# Patient Record
Sex: Female | Born: 1940
Health system: Southern US, Community
[De-identification: ages and names within clinical notes are randomized; demographics above are authoritative.]

## PROBLEM LIST (undated history)

## (undated) DIAGNOSIS — N390 Urinary tract infection, site not specified: Secondary | ICD-10-CM

## (undated) DIAGNOSIS — K259 Gastric ulcer, unspecified as acute or chronic, without hemorrhage or perforation: Secondary | ICD-10-CM

## (undated) DIAGNOSIS — D649 Anemia, unspecified: Secondary | ICD-10-CM

## (undated) DIAGNOSIS — J449 Chronic obstructive pulmonary disease, unspecified: Secondary | ICD-10-CM

## (undated) DIAGNOSIS — J479 Bronchiectasis, uncomplicated: Secondary | ICD-10-CM

## (undated) DIAGNOSIS — A809 Acute poliomyelitis, unspecified: Secondary | ICD-10-CM

## (undated) DIAGNOSIS — I10 Essential (primary) hypertension: Secondary | ICD-10-CM

## (undated) DIAGNOSIS — H353190 Nonexudative age-related macular degeneration, unspecified eye, stage unspecified: Secondary | ICD-10-CM

## (undated) DIAGNOSIS — I776 Arteritis, unspecified: Secondary | ICD-10-CM

## (undated) DIAGNOSIS — A310 Pulmonary mycobacterial infection: Secondary | ICD-10-CM

## (undated) DIAGNOSIS — C801 Malignant (primary) neoplasm, unspecified: Secondary | ICD-10-CM

## (undated) DIAGNOSIS — F419 Anxiety disorder, unspecified: Secondary | ICD-10-CM

## (undated) DIAGNOSIS — H269 Unspecified cataract: Secondary | ICD-10-CM

## (undated) DIAGNOSIS — E785 Hyperlipidemia, unspecified: Secondary | ICD-10-CM

## (undated) DIAGNOSIS — I6529 Occlusion and stenosis of unspecified carotid artery: Secondary | ICD-10-CM

## (undated) DIAGNOSIS — D126 Benign neoplasm of colon, unspecified: Secondary | ICD-10-CM

## (undated) DIAGNOSIS — R0600 Dyspnea, unspecified: Secondary | ICD-10-CM

## (undated) HISTORY — DX: Gastric ulcer, unspecified as acute or chronic, without hemorrhage or perforation: K25.9

## (undated) HISTORY — DX: Benign neoplasm of colon, unspecified: D12.6

## (undated) HISTORY — DX: Essential (primary) hypertension: I10

## (undated) HISTORY — PX: CATARACT EXTRACTION: SUR2

## (undated) HISTORY — DX: Occlusion and stenosis of unspecified carotid artery: I65.29

## (undated) HISTORY — DX: Acute poliomyelitis, unspecified: A80.9

## (undated) HISTORY — PX: APPENDECTOMY: SHX54

## (undated) HISTORY — DX: Anxiety disorder, unspecified: F41.9

## (undated) HISTORY — PX: BREAST ENHANCEMENT SURGERY: SHX7

## (undated) HISTORY — PX: COLONOSCOPY: SHX174

## (undated) HISTORY — PX: TONSILLECTOMY AND ADENOIDECTOMY: SUR1326

## (undated) HISTORY — DX: Chronic obstructive pulmonary disease, unspecified: J44.9

## (undated) HISTORY — DX: Arteritis, unspecified: I77.6

## (undated) HISTORY — DX: Pulmonary mycobacterial infection: A31.0

## (undated) HISTORY — DX: Hyperlipidemia, unspecified: E78.5

## (undated) HISTORY — DX: Unspecified cataract: H26.9

---
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
## 1958-03-15 DIAGNOSIS — K259 Gastric ulcer, unspecified as acute or chronic, without hemorrhage or perforation: Secondary | ICD-10-CM

## 1958-03-15 HISTORY — DX: Gastric ulcer, unspecified as acute or chronic, without hemorrhage or perforation: K25.9

## 1968-03-15 HISTORY — PX: VAGINAL HYSTERECTOMY: SUR661

## 1998-10-27 ENCOUNTER — Other Ambulatory Visit: Admission: RE | Admit: 1998-10-27 | Discharge: 1998-10-27 | Payer: Self-pay | Admitting: Internal Medicine

## 1998-12-15 ENCOUNTER — Encounter: Payer: Self-pay | Admitting: Internal Medicine

## 1998-12-15 ENCOUNTER — Ambulatory Visit (HOSPITAL_COMMUNITY): Admission: RE | Admit: 1998-12-15 | Discharge: 1998-12-15 | Payer: Self-pay | Admitting: Internal Medicine

## 1999-01-06 ENCOUNTER — Other Ambulatory Visit: Admission: RE | Admit: 1999-01-06 | Discharge: 1999-01-06 | Payer: Self-pay | Admitting: Gastroenterology

## 1999-01-06 ENCOUNTER — Encounter (INDEPENDENT_AMBULATORY_CARE_PROVIDER_SITE_OTHER): Payer: Self-pay

## 2000-01-07 ENCOUNTER — Encounter: Payer: Self-pay | Admitting: Internal Medicine

## 2000-01-07 ENCOUNTER — Encounter: Admission: RE | Admit: 2000-01-07 | Discharge: 2000-01-07 | Payer: Self-pay | Admitting: Internal Medicine

## 2005-05-06 ENCOUNTER — Emergency Department (HOSPITAL_COMMUNITY): Admission: EM | Admit: 2005-05-06 | Discharge: 2005-05-06 | Payer: Self-pay | Admitting: Family Medicine

## 2006-05-25 ENCOUNTER — Encounter: Admission: RE | Admit: 2006-05-25 | Discharge: 2006-05-25 | Payer: Self-pay | Admitting: Internal Medicine

## 2007-04-14 ENCOUNTER — Encounter: Admission: RE | Admit: 2007-04-14 | Discharge: 2007-04-14 | Payer: Self-pay | Admitting: Internal Medicine

## 2008-10-22 ENCOUNTER — Encounter (INDEPENDENT_AMBULATORY_CARE_PROVIDER_SITE_OTHER): Payer: Self-pay | Admitting: *Deleted

## 2008-12-30 ENCOUNTER — Observation Stay (HOSPITAL_COMMUNITY): Admission: EM | Admit: 2008-12-30 | Discharge: 2008-12-31 | Payer: Self-pay | Admitting: Emergency Medicine

## 2008-12-31 ENCOUNTER — Encounter (INDEPENDENT_AMBULATORY_CARE_PROVIDER_SITE_OTHER): Payer: Self-pay | Admitting: Internal Medicine

## 2008-12-31 ENCOUNTER — Ambulatory Visit: Payer: Self-pay | Admitting: Vascular Surgery

## 2009-03-15 HISTORY — PX: POLYPECTOMY: SHX149

## 2009-04-04 ENCOUNTER — Telehealth: Payer: Self-pay | Admitting: Gastroenterology

## 2009-08-27 ENCOUNTER — Encounter (INDEPENDENT_AMBULATORY_CARE_PROVIDER_SITE_OTHER): Payer: Self-pay | Admitting: *Deleted

## 2009-09-26 ENCOUNTER — Encounter (INDEPENDENT_AMBULATORY_CARE_PROVIDER_SITE_OTHER): Payer: Self-pay

## 2009-09-30 ENCOUNTER — Ambulatory Visit: Payer: Self-pay | Admitting: Gastroenterology

## 2009-10-14 ENCOUNTER — Ambulatory Visit: Payer: Self-pay | Admitting: Gastroenterology

## 2009-10-14 HISTORY — PX: COLONOSCOPY: SHX174

## 2009-10-16 ENCOUNTER — Encounter: Payer: Self-pay | Admitting: Gastroenterology

## 2009-10-22 ENCOUNTER — Encounter: Admission: RE | Admit: 2009-10-22 | Discharge: 2009-10-22 | Payer: Self-pay | Admitting: Otolaryngology

## 2009-12-30 ENCOUNTER — Ambulatory Visit
Admission: RE | Admit: 2009-12-30 | Discharge: 2009-12-30 | Payer: Self-pay | Source: Home / Self Care | Admitting: Otolaryngology

## 2010-04-16 NOTE — Procedures (Signed)
Summary: Colonoscopy  Patient: Heidi Hobbs Note: All result statuses are Final unless otherwise noted.  Tests: (1) Colonoscopy (COL)   COL Colonoscopy           DONE     [facility]     [address]     [place], [place]  [address]           COLONOSCOPY PROCEDURE REPORT           PATIENT:  Heidi Hobbs, Heidi Hobbs  MR#:  [id number]     BIRTHDATE:  17-Apr-1940, 68 yrs. old  GENDER:  female           ENDOSCOPIST:  Judie Petit T. Russella Dar, MD, [place]           PROCEDURE DATE:  10/14/2009     PROCEDURE:  Colonoscopy with biopsy and snare polypectomy,     Colonoscopy with hot biopsy     ASA CLASS:  Class II     INDICATIONS:  1) follow-up of polyp  2) family history of colon     cancer  3) surveillance and high-risk screening, adenomatous     polyp, 10/1993. Father with colon cancer.           MEDICATIONS:   Fentanyl 100 mcg IV, Versed 10 mg IV           DESCRIPTION OF PROCEDURE:   After the risks benefits and     alternatives of the procedure were thoroughly explained, informed     consent was obtained.  Digital rectal exam was performed and     revealed no abnormalities.   The LB PCF-[id number] endoscope     was introduced through the anus and advanced to the cecum, which     was identified by both the appendix and ileocecal valve, limited     by a tortuous and redundant colon.    The quality of the prep was     good, using MoviPrep.  The instrument was then slowly withdrawn as     the colon was fully examined. No photos due to technical     difficulties.           FINDINGS:  A sessile polyp was found at the hepatic flexure. It     was 8 mm in size. Polyp was snared, then cauterized with monopolar     cautery. Retrieval was successful. Piecemeal polypectomy,     difficult location on a fold in the flexure.  A sessile polyp was     found in the descending colon. It was 5 mm in size. Polyp was     snared without cautery. Retrieval was successful.  A sessile polyp     was found in the  sigmoid colon. It was 4 mm in size. The polyp was     removed using cold biopsy forceps.  Two polyps were found in the     sigmoid colon. They were 4 mm in size. Polyps were snared without     cautery. Retrieval was successful. Three polyps were found in the     sigmoid colon. They were 4 mm in size. With hot biopsy forceps,     biopsies were obtained and sent to pathology. Moderate     diverticulosis was found in the sigmoid to descending colon.  This     was otherwise a normal examination of the colon. Retroflexed views     in the rectum revealed no abnormalities.  The time to cecum =  6.25  minutes. The scope was then withdrawn (time =  18  min) from     the patient and the procedure completed.           COMPLICATIONS:  None           ENDOSCOPIC IMPRESSION:     1) 8 mm sessile polyp at the hepatic flexure     2) 5 mm sessile polyp in the descending colon     3) 4 mm sessile polyp in the sigmoid colon     4) 4 mm Two polyps in the sigmoid colon     5) 4 mm Three polyps in the sigmoid colon     6) Moderate diverticulosis in the sigmoid to descending colon           RECOMMENDATIONS:     1) No aspirin or NSAID's for 2 weeks     2) Await pathology results     3) Repeat Colonoscopy in 1 year if the hepatic flexure is     adenomatous, 3 years if 3 or more polyps are adenomatous and     otherwise 5 years.     4) High fiber diet with liberal fluid intake.           Heidi Lick. Russella Dar, MD, [facility]           CC: Creola Corn, MD           n.     Rosalie DoctorVenita Lick. Stark at 10/14/2009 09:54 AM           Heidi Hobbs, [id number]  Note: An exclamation mark (!) indicates a result that was not dispersed into the flowsheet. Document Creation Date: 10/14/2009 9:56 AM _______________________________________________________________________  (1) Order result status: Final Collection or observation date-time: 10/14/2009 09:45 Requested date-time:  Receipt date-time:  Reported date-time:    Referring Physician:   Ordering Physician: Claudette Head [phone]) Specimen Source:  Source: Launa Grill Order Number: [id number] Lab site:   Appended Document: Colonoscopy colon 10/2010  Appended Document: Colonoscopy     Procedures Next Due Date:    Colonoscopy: 10/2010

## 2010-04-16 NOTE — Letter (Signed)
Summary: Patient Notice- Polyp Results  [facility]  [address] [place], [place] [address]   Phone: [phone]  Fax: [phone]        October 16, 2009 MRN: [id number]    [facility] [address] [place], [place]  [address]    Dear Heidi Hobbs,  I am pleased to inform you that the colon polyp(s) removed during your recent colonoscopy was (were) found to be benign (no cancer detected) upon pathologic examination.  I recommend you have a repeat colonoscopy examination in 1 year to look for recurrent polyps, as having colon polyps increases your risk for having recurrent polyps or even colon cancer in the future.  Should you develop new or worsening symptoms of abdominal pain, bowel habit changes or bleeding from the rectum or bowels, please schedule an evaluation with either your primary care physician or with me.  Continue treatment plan as outlined the day of your exam.  Please call us if you are having persistent problems or have questions about your condition that have not been fully answered at this time.  Sincerely,  Meryl Dare MD [facility]  This letter has been electronically signed by your physician.  Appended Document: Patient Notice- Polyp Results letter mailed

## 2010-04-16 NOTE — Letter (Signed)
Summary: [facility] Instructions  [facility]  [address] [place], [place] [address]   Phone: [phone]  Fax: [phone]       MUNA DEMERS    72/25/1942    MRN: [id number]        Procedure Day Dorna Bloom:  Jake Shark  10/14/09     Arrival Time:  8:00am     Procedure Time: 9:00am     Location of Procedure:                    _ X_  [facility] (4th Floor)                       PREPARATION FOR COLONOSCOPY WITH MOVIPREP   Starting 5 days prior to your procedure  THURSDAY 07/28   do not eat nuts, seeds, popcorn, corn, beans, peas,  salads, or any raw vegetables.  Do not take any fiber supplements (e.g. Metamucil, Citrucel, and Benefiber).  THE DAY BEFORE YOUR PROCEDURE         DATE:  MONDAY  08/01   1.  Drink clear liquids the entire day-NO SOLID FOOD  2.  Do not drink anything colored red or purple.  Avoid juices with pulp.  No orange juice.  3.  Drink at least 64 oz. (8 glasses) of fluid/clear liquids during the day to prevent dehydration and help the prep work efficiently.  CLEAR LIQUIDS INCLUDE: Water Jello Ice Popsicles Tea (sugar ok, no milk/cream) Powdered fruit flavored drinks Coffee (sugar ok, no milk/cream) Gatorade Juice: apple, white grape, white cranberry  Lemonade Clear bullion, consomm, broth Carbonated beverages (any kind) Strained chicken noodle soup Hard Candy                             4.  In the morning, mix first dose of MoviPrep solution:    Empty 1 Pouch A and 1 Pouch B into the disposable container    Add lukewarm drinking water to the top line of the container. Mix to dissolve    Refrigerate (mixed solution should be used within 24 hrs)  5.  Begin drinking the prep at 5:00 p.m. The MoviPrep container is divided by 4 marks.   Every 15 minutes drink the solution down to the next mark (approximately 8 oz) until the full liter is complete.   6.  Follow completed prep with 16 oz of clear liquid of your choice  (Nothing red or purple).  Continue to drink clear liquids until bedtime.  7.  Before going to bed, mix second dose of MoviPrep solution:    Empty 1 Pouch A and 1 Pouch B into the disposable container    Add lukewarm drinking water to the top line of the container. Mix to dissolve    Refrigerate  THE DAY OF YOUR PROCEDURE      DATE: TUESDAY  08/02  Beginning at  4:00 a.m. (5 hours before procedure):         1. Every 15 minutes, drink the solution down to the next mark (approx 8 oz) until the full liter is complete.  2. Follow completed prep with 16 oz. of clear liquid of your choice.    3. You may drink clear liquids until 7:00am  (2 HOURS BEFORE PROCEDURE).   MEDICATION INSTRUCTIONS  Unless otherwise instructed, you should take regular prescription medications with a small sip of water   as  early as possible the morning of your procedure.  Additional medication instructions: Do not take Losartan am of procedure.         OTHER INSTRUCTIONS  You will need a responsible adult at least 72 years of age to accompany you and drive you home.   This person must remain in the waiting room during your procedure.  Wear loose fitting clothing that is easily removed.  Leave jewelry and other valuables at home.  However, you may wish to bring a book to read or  an iPod/MP3 player to listen to music as you wait for your procedure to start.  Remove all body piercing jewelry and leave at home.  Total time from sign-in until discharge is approximately 2-3 hours.  You should go home directly after your procedure and rest.  You can resume normal activities the  day after your procedure.  The day of your procedure you should not:   Drive   Make legal decisions   Operate machinery   Drink alcohol   Return to work  You will receive specific instructions about eating, activities and medications before you leave.    The above instructions have been reviewed and explained to me  by   Ulis Rias RN  September 30, 2009 9:26 AM     I fully understand and can verbalize these instructions _____________________________ Date _________

## 2010-04-16 NOTE — Letter (Signed)
Summary: Previsit letter  [facility]  [address] [place], [place] [address]   Phone: [phone]  Fax: [phone]       08/27/2009 MRN: [id number]  [facility] [address] [place], [place]  [address]  [place]  Dear Ms. Heidi Hobbs,  Welcome to the Gastroenterology Division at [facility].    You are scheduled to see a nurse for your pre-procedure visit on 09-30-09 at 9am on the 3rd floor at [facility] [address]  We ask that you try to arrive at our office 15 minutes prior to your appointment time to allow for check-in.  Your nurse visit will consist of discussing your medical and surgical history, your immediate family medical history, and your medications.    Please bring a complete list of all your medications or, if you prefer, bring the medication bottles and we will list them.  We will need to be aware of both prescribed and over the counter drugs.  We will need to know exact dosage information as well.  If you are on blood thinners (Coumadin, Plavix, Aggrenox, Ticlid, etc.) please call our office today/prior to your appointment, as we need to consult with your physician about holding your medication.   Please be prepared to read and sign documents such as consent forms, a financial agreement, and acknowledgement forms.  If necessary, and with your consent, a friend or relative is welcome to sit-in on the nurse visit with you.  Please bring your insurance card so that we may make a copy of it.  If your insurance requires a referral to see a specialist, please bring your referral form from your primary care physician.  No co-pay is required for this nurse visit.     If you cannot keep your appointment, please call [phone] to cancel or reschedule prior to your appointment date.  This allows [place] the opportunity to schedule an appointment for another patient in need of care.    Thank you for choosing [facility] for your medical  needs.  We appreciate the opportunity to care for you.  Please visit [place] at our website  to learn more about our practice.                     Sincerely.                                                                                                                   The Gastroenterology Division

## 2010-04-16 NOTE — Progress Notes (Signed)
Summary: Schedule Colonoscopy  Phone Note Outgoing Call   Call placed by: Hortense Ramal CMA Duncan Dull),  April 04, 2009 3:06 PM Call placed to: Patient Summary of Call: I called to advise patient that she is due for a recall colonoscopy due to her family history of colon cancer and her previous history of hyperplastic colonic polyps with Diverticulosis. Patient is unavailable but a female took a message and states she will have patient to call our office. Initial call taken by: Hortense Ramal CMA Duncan Dull),  April 04, 2009 3:07 PM  Follow-up for Phone Call        Left message for patient to call back. Hortense Ramal CMA Duncan Dull)  April 11, 2009 10:27 AM   Left message with a female for patient to call back. Hortense Ramal CMA Duncan Dull)  April 16, 2009 1:02 PM   I have not recieved a call back from patient. We will send a letter. Hortense Ramal CMA Duncan Dull)  April 18, 2009 8:54 AM

## 2010-04-16 NOTE — Miscellaneous (Signed)
Summary: Lec previsit  Clinical Lists Changes  Medications: Added new medication of MOVIPREP 100 GM  SOLR (PEG-KCL-NACL-NASULF-NA ASC-C) As per prep instructions. - Signed Rx of MOVIPREP 100 GM  SOLR (PEG-KCL-NACL-NASULF-NA ASC-C) As per prep instructions.;  #1 x 0;  Signed;  Entered by: Ulis Rias RN;  Authorized by: Meryl Dare MD [facility];  Method used: Electronically to [id number] [address], [place], [place]  [address], Ph: [phone] or [phone], Fax: [phone] Observations: Added new observation of NKA: T (09/30/2009 9:00)    Prescriptions: MOVIPREP 100 GM  SOLR (PEG-KCL-NACL-NASULF-NA ASC-C) As per prep instructions.  #1 x 0   Entered by:   Ulis Rias RN   Authorized by:   Meryl Dare MD [facility]   Signed by:   Ulis Rias RN on 09/30/2009   Method used:   Electronically to        [id number] (retail)       [address]       [place], [place]  [address]       Ph: [phone] or [phone]       Fax: [phone]   RxID:   [id number]

## 2010-05-05 ENCOUNTER — Other Ambulatory Visit: Payer: Self-pay | Admitting: Internal Medicine

## 2010-05-05 DIAGNOSIS — I6529 Occlusion and stenosis of unspecified carotid artery: Secondary | ICD-10-CM

## 2010-05-07 ENCOUNTER — Other Ambulatory Visit: Payer: Self-pay

## 2010-05-07 ENCOUNTER — Ambulatory Visit
Admission: RE | Admit: 2010-05-07 | Discharge: 2010-05-07 | Disposition: A | Discharge status: Home / Self Care | Payer: Medicare Other | Source: Ambulatory Visit | Attending: Internal Medicine | Admitting: Internal Medicine

## 2010-05-07 DIAGNOSIS — I6529 Occlusion and stenosis of unspecified carotid artery: Secondary | ICD-10-CM

## 2010-05-07 MED ORDER — IOHEXOL 350 MG/ML SOLN
100.0000 mL | Freq: Once | INTRAVENOUS | Status: AC | PRN
  Administered 2010-05-07: 100 mL via INTRAVENOUS

## 2010-05-15 ENCOUNTER — Other Ambulatory Visit: Payer: Self-pay | Admitting: Internal Medicine

## 2010-05-15 DIAGNOSIS — R911 Solitary pulmonary nodule: Secondary | ICD-10-CM

## 2010-05-19 ENCOUNTER — Ambulatory Visit
Admission: RE | Admit: 2010-05-19 | Discharge: 2010-05-19 | Disposition: A | Discharge status: Home / Self Care | Payer: Medicare Other | Source: Ambulatory Visit | Attending: Internal Medicine | Admitting: Internal Medicine

## 2010-05-19 DIAGNOSIS — R911 Solitary pulmonary nodule: Secondary | ICD-10-CM

## 2010-06-18 LAB — GLUCOSE, CAPILLARY: Glucose-Capillary: 93 mg/dL (ref 70–99)

## 2010-06-18 LAB — POCT I-STAT, CHEM 8
BUN: 13 mg/dL (ref 6–23)
Calcium, Ion: 1.19 mmol/L (ref 1.12–1.32)
Chloride: 101 mEq/L (ref 96–112)
Creatinine, Ser: 0.8 mg/dL (ref 0.4–1.2)
Glucose, Bld: 104 mg/dL — ABNORMAL HIGH (ref 70–99)
HCT: 43 % (ref 36.0–46.0)
Sodium: 139 mEq/L (ref 135–145)

## 2010-06-18 LAB — URINALYSIS, ROUTINE W REFLEX MICROSCOPIC
Glucose, UA: NEGATIVE mg/dL
Ketones, ur: NEGATIVE mg/dL
Leukocytes, UA: NEGATIVE
Specific Gravity, Urine: 1.006 (ref 1.005–1.030)
Urobilinogen, UA: 0.2 mg/dL (ref 0.0–1.0)

## 2010-06-18 LAB — CSF CELL COUNT WITH DIFFERENTIAL
RBC Count, CSF: 3 /mm3 — ABNORMAL HIGH
RBC Count, CSF: 36 /mm3 — ABNORMAL HIGH
Tube #: 1
WBC, CSF: 0 /mm3 (ref 0–5)

## 2010-06-18 LAB — CBC
HCT: 38.3 % (ref 36.0–46.0)
HCT: 41.4 % (ref 36.0–46.0)
Hemoglobin: 13.1 g/dL (ref 12.0–15.0)
MCHC: 34.3 g/dL (ref 30.0–36.0)
MCHC: 34.3 g/dL (ref 30.0–36.0)
MCV: 93 fL (ref 78.0–100.0)
Platelets: 240 10*3/uL (ref 150–400)
RBC: 4.12 MIL/uL (ref 3.87–5.11)
RDW: 13.9 % (ref 11.5–15.5)
WBC: 8.5 10*3/uL (ref 4.0–10.5)

## 2010-06-18 LAB — GRAM STAIN

## 2010-06-18 LAB — CSF CULTURE W GRAM STAIN

## 2010-06-18 LAB — PROTIME-INR
INR: 0.88 (ref 0.00–1.49)
Prothrombin Time: 11.9 seconds (ref 11.6–15.2)

## 2010-06-18 LAB — DIFFERENTIAL
Basophils Absolute: 0.1 10*3/uL (ref 0.0–0.1)
Eosinophils Absolute: 0.1 10*3/uL (ref 0.0–0.7)
Eosinophils Relative: 1 % (ref 0–5)
Lymphocytes Relative: 28 % (ref 12–46)
Lymphs Abs: 3 10*3/uL (ref 0.7–4.0)
Monocytes Absolute: 0.8 10*3/uL (ref 0.1–1.0)
Monocytes Relative: 8 % (ref 3–12)
Neutro Abs: 6.6 10*3/uL (ref 1.7–7.7)

## 2010-06-18 LAB — COMPREHENSIVE METABOLIC PANEL
AST: 19 U/L (ref 0–37)
Albumin: 3.3 g/dL — ABNORMAL LOW (ref 3.5–5.2)
BUN: 9 mg/dL (ref 6–23)
CO2: 30 mEq/L (ref 19–32)
Calcium: 9.2 mg/dL (ref 8.4–10.5)
GFR calc non Af Amer: >60 mL/min (ref >60)
Total Protein: 6.7 g/dL (ref 6.0–8.3)

## 2010-06-18 LAB — URINE CULTURE: Culture: NO GROWTH

## 2010-06-18 LAB — POCT CARDIAC MARKERS: Myoglobin, poc: 73.9 ng/mL (ref 12–200)

## 2010-06-18 LAB — URINE MICROSCOPIC-ADD ON

## 2010-09-14 DIAGNOSIS — E785 Hyperlipidemia, unspecified: Secondary | ICD-10-CM | POA: Insufficient documentation

## 2010-10-29 ENCOUNTER — Encounter: Payer: Self-pay | Admitting: Gastroenterology

## 2010-12-28 ENCOUNTER — Ambulatory Visit
Admission: RE | Admit: 2010-12-28 | Discharge: 2010-12-28 | Disposition: A | Discharge status: Home / Self Care | Payer: Medicare Other | Source: Ambulatory Visit | Attending: Internal Medicine | Admitting: Internal Medicine

## 2010-12-28 ENCOUNTER — Other Ambulatory Visit: Payer: Self-pay | Admitting: Internal Medicine

## 2010-12-28 DIAGNOSIS — R05 Cough: Secondary | ICD-10-CM

## 2010-12-28 DIAGNOSIS — R059 Cough, unspecified: Secondary | ICD-10-CM

## 2010-12-30 ENCOUNTER — Other Ambulatory Visit (HOSPITAL_COMMUNITY): Payer: Self-pay | Admitting: Internal Medicine

## 2010-12-30 DIAGNOSIS — I272 Pulmonary hypertension, unspecified: Secondary | ICD-10-CM

## 2010-12-31 ENCOUNTER — Ambulatory Visit (HOSPITAL_COMMUNITY): Payer: Medicare Other | Attending: Internal Medicine | Admitting: Radiology

## 2010-12-31 DIAGNOSIS — I059 Rheumatic mitral valve disease, unspecified: Secondary | ICD-10-CM | POA: Insufficient documentation

## 2010-12-31 DIAGNOSIS — E785 Hyperlipidemia, unspecified: Secondary | ICD-10-CM | POA: Insufficient documentation

## 2010-12-31 DIAGNOSIS — I272 Pulmonary hypertension, unspecified: Secondary | ICD-10-CM

## 2010-12-31 DIAGNOSIS — I2589 Other forms of chronic ischemic heart disease: Secondary | ICD-10-CM

## 2010-12-31 DIAGNOSIS — I270 Primary pulmonary hypertension: Secondary | ICD-10-CM | POA: Insufficient documentation

## 2010-12-31 DIAGNOSIS — I079 Rheumatic tricuspid valve disease, unspecified: Secondary | ICD-10-CM | POA: Insufficient documentation

## 2011-01-01 ENCOUNTER — Encounter (HOSPITAL_COMMUNITY): Payer: Self-pay | Admitting: Internal Medicine

## 2011-04-20 ENCOUNTER — Ambulatory Visit (AMBULATORY_SURGERY_CENTER): Payer: Medicare Other | Admitting: *Deleted

## 2011-04-20 VITALS — Ht 68.0 in | Wt 156.4 lb

## 2011-04-20 DIAGNOSIS — Z1211 Encounter for screening for malignant neoplasm of colon: Secondary | ICD-10-CM

## 2011-04-20 MED ORDER — MOVIPREP 100 G PO SOLR: ORAL | Status: DC

## 2011-04-22 ENCOUNTER — Other Ambulatory Visit (INDEPENDENT_AMBULATORY_CARE_PROVIDER_SITE_OTHER): Payer: Medicare Other | Admitting: *Deleted

## 2011-04-22 DIAGNOSIS — I6529 Occlusion and stenosis of unspecified carotid artery: Secondary | ICD-10-CM

## 2011-05-04 ENCOUNTER — Ambulatory Visit (AMBULATORY_SURGERY_CENTER): Payer: Medicare Other | Admitting: Gastroenterology

## 2011-05-04 ENCOUNTER — Encounter: Payer: Self-pay | Admitting: Gastroenterology

## 2011-05-04 VITALS — BP 132/70 | HR 86 | Temp 98.6°F | Resp 17 | Ht 68.0 in | Wt 156.0 lb

## 2011-05-04 DIAGNOSIS — Z86010 Personal history of colonic polyps: Secondary | ICD-10-CM

## 2011-05-04 DIAGNOSIS — D126 Benign neoplasm of colon, unspecified: Secondary | ICD-10-CM

## 2011-05-04 DIAGNOSIS — Z800 Family history of malignant neoplasm of digestive organs: Secondary | ICD-10-CM

## 2011-05-04 DIAGNOSIS — Z1211 Encounter for screening for malignant neoplasm of colon: Secondary | ICD-10-CM

## 2011-05-04 MED ORDER — SODIUM CHLORIDE 0.9 % IV SOLN: 500.0000 mL | INTRAVENOUS | Status: DC

## 2011-05-04 NOTE — Progress Notes (Signed)
Pressure applied to the abdomen to reach cecum. 

## 2011-05-04 NOTE — Patient Instructions (Signed)
YOU HAD AN ENDOSCOPIC PROCEDURE TODAY AT THE [facility]: Refer to the procedure report that was given to you for any specific questions about what was found during the examination.  If the procedure report does not answer your questions, please call your gastroenterologist to clarify.  If you requested that your care partner not be given the details of your procedure findings, then the procedure report has been included in a sealed envelope for you to review at your convenience later.  YOU SHOULD EXPECT: Some feelings of bloating in the abdomen. Passage of more gas than usual.  Walking can help get rid of the air that was put into your GI tract during the procedure and reduce the bloating. If you had a lower endoscopy (such as a colonoscopy or flexible sigmoidoscopy) you may notice spotting of blood in your stool or on the toilet paper. If you underwent a bowel prep for your procedure, then you may not have a normal bowel movement for a few days.  DIET: Your first meal following the procedure should be a light meal and then it is ok to progress to your normal diet.  A half-sandwich or bowl of soup is an example of a good first meal.  Heavy or fried foods are harder to digest and may make you feel nauseous or bloated.  Likewise meals heavy in dairy and vegetables can cause extra gas to form and this can also increase the bloating.  Drink plenty of fluids but you should avoid alcoholic beverages for 24 hours.  ACTIVITY: Your care partner should take you home directly after the procedure.  You should plan to take it easy, moving slowly for the rest of the day.  You can resume normal activity the day after the procedure however you should NOT DRIVE or use heavy machinery for 24 hours (because of the sedation medicines used during the test).    SYMPTOMS TO REPORT IMMEDIATELY: A gastroenterologist can be reached at any hour.  During normal business hours, 8:30 AM to 5:00 PM Monday through Friday,  call [phone].  After hours and on weekends, please call the GI answering service at [phone] who will take a message and have the physician on call contact you.   Following lower endoscopy (colonoscopy or flexible sigmoidoscopy):  Excessive amounts of blood in the stool  Significant tenderness or worsening of abdominal pains  Swelling of the abdomen that is new, acute  Fever of 100F or higher  Following upper endoscopy (EGD)  Vomiting of blood or coffee ground material  New chest pain or pain under the shoulder blades  Painful or persistently difficult swallowing  New shortness of breath  Fever of 100F or higher  Black, tarry-looking stools  FOLLOW UP: If any biopsies were taken you will be contacted by phone or by letter within the next 1-3 weeks.  Call your gastroenterologist if you have not heard about the biopsies in 3 weeks.  Our staff will call the home number listed on your records the next business day following your procedure to check on you and address any questions or concerns that you may have at that time regarding the information given to you following your procedure. This is a courtesy call and so if there is no answer at the home number and we have not heard from you through the emergency physician on call, we will assume that you have returned to your regular daily activities without incident.  SIGNATURES/CONFIDENTIALITY: You and/or your care   partner have signed paperwork which will be entered into your electronic medical record.  These signatures attest to the fact that that the information above on your After Visit Summary has been reviewed and is understood.  Full responsibility of the confidentiality of this discharge information lies with you and/or your care-partner.   Colon Polyps A polyp is extra tissue that grows inside your body. Colon polyps grow in the large intestine. The large intestine, also called the colon, is part of your digestive system. It  is a long, hollow tube at the end of your digestive tract where your body makes and stores stool. Most polyps are not dangerous. They are benign. This means they are not cancerous. But over time, some types of polyps can turn into cancer. Polyps that are smaller than a pea are usually not harmful. But larger polyps could someday become or may already be cancerous. To be safe, doctors remove all polyps and test them.  WHO GETS POLYPS? Anyone can get polyps, but certain people are more likely than others. You may have a greater chance of getting polyps if:  You are over 50.   You have had polyps before.   Someone in your family has had polyps.   Someone in your family has had cancer of the large intestine.   Find out if someone in your family has had polyps. You may also be more likely to get polyps if you:   Eat a lot of fatty foods.   Smoke.   Drink alcohol.   Do not exercise.   Eat too much.  SYMPTOMS  Most small polyps do not cause symptoms. People often do not know they have one until their caregiver finds it during a regular checkup or while testing them for something else. Some people do have symptoms like these:  Bleeding from the anus. You might notice blood on your underwear or on toilet paper after you have had a bowel movement.   Constipation or diarrhea that lasts more than a week.   Blood in the stool. Blood can make stool look black or it can show up as red streaks in the stool.  If you have any of these symptoms, see your caregiver. HOW DOES THE DOCTOR TEST FOR POLYPS? The doctor can use four tests to check for polyps:  Digital rectal exam. The caregiver wears gloves and checks your rectum (the last part of the large intestine) to see if it feels normal. This test would find polyps only in the rectum. Your caregiver may need to do one of the other tests listed below to find polyps higher up in the intestine.   Barium enema. The caregiver puts a liquid called  barium into your rectum before taking x-rays of your large intestine. Barium makes your intestine look white in the pictures. Polyps are dark, so they are easy to see.   Sigmoidoscopy. With this test, the caregiver can see inside your large intestine. A thin flexible tube is placed into your rectum. The device is called a sigmoidoscope, which has a light and a tiny video camera in it. The caregiver uses the sigmoidoscope to look at the last third of your large intestine.   Colonoscopy. This test is like sigmoidoscopy, but the caregiver looks at all of the large intestine. It usually requires sedation. This is the most common method for finding and removing polyps.  TREATMENT   The caregiver will remove the polyp during sigmoidoscopy or colonoscopy. The polyp is then tested  for cancer.   If you have had polyps, your caregiver may want you to get tested regularly in the future.  PREVENTION  There is not one sure way to prevent polyps. You might be able to lower your risk of getting them if you:  Eat more fruits and vegetables and less fatty food.   Do not smoke.   Avoid alcohol.   Exercise every day.   Lose weight if you are overweight.   Eating more calcium and folate can also lower your risk of getting polyps. Some foods that are rich in calcium are milk, cheese, and broccoli. Some foods that are rich in folate are chickpeas, kidney beans, and spinach.   Aspirin might help prevent polyps. Studies are under way.  Document Released: 11/26/2003 Document Revised: 11/11/2010 Document Reviewed: 05/03/2007 [facility] Patient Information 2012 [place], [place].  Diverticulosis Diverticulosis is a common condition that develops when small pouches (diverticula) form in the wall of the colon. The risk of diverticulosis increases with age. It happens more often in people who eat a low-fiber diet. Most individuals with diverticulosis have no symptoms. Those individuals with symptoms usually experience  abdominal pain, constipation, or loose stools (diarrhea). HOME CARE INSTRUCTIONS   Increase the amount of fiber in your diet as directed by your caregiver or dietician. This may reduce symptoms of diverticulosis.   Your caregiver may recommend taking a dietary fiber supplement.   Drink at least 6 to 8 glasses of water each day to prevent constipation.   Try not to strain when you have a bowel movement.   Your caregiver may recommend avoiding nuts and seeds to prevent complications, although this is still an uncertain benefit.   Only take over-the-counter or prescription medicines for pain, discomfort, or fever as directed by your caregiver.  FOODS WITH HIGH FIBER CONTENT INCLUDE:  Fruits. Apple, peach, pear, tangerine, raisins, prunes.   Vegetables. Brussels sprouts, asparagus, broccoli, cabbage, carrot, cauliflower, romaine lettuce, spinach, summer squash, tomato, winter squash, zucchini.   Starchy Vegetables. Baked beans, kidney beans, lima beans, split peas, lentils, potatoes (with skin).   Grains. Whole wheat bread, brown rice, bran flake cereal, plain oatmeal, white rice, shredded wheat, bran muffins.  SEEK IMMEDIATE MEDICAL CARE IF:   You develop increasing pain or severe bloating.   You have an oral temperature above 102 F (38.9 C), not controlled by medicine.   You develop vomiting or bowel movements that are bloody or black.  Document Released: 11/27/2003 Document Revised: 11/11/2010 Document Reviewed: 07/30/2009 [facility] Patient Information 2012 [place], [place].

## 2011-05-04 NOTE — Progress Notes (Signed)
Patient did not experience any of the following events: a burn prior to discharge; a fall within the facility; wrong site/side/patient/procedure/implant event; or a hospital transfer or hospital admission upon discharge from the facility. (G8907) Patient did not have preoperative order for IV antibiotic SSI prophylaxis. (G8918)  

## 2011-05-04 NOTE — Op Note (Signed)
[facility] [address] [place], [place]  [address]  COLONOSCOPY PROCEDURE REPORT PATIENT:  Heidi, Hobbs  MR#:  [id number] BIRTHDATE:  1940-12-07, 70 yrs. old  GENDER:  female ENDOSCOPIST:  Judie Petit T. Russella Dar, MD, [facility]  PROCEDURE DATE:  05/04/2011 PROCEDURE:  Colonoscopy with biopsy ASA CLASS:  Class II INDICATIONS:  1) surveillance and high-risk screening  2) history of pre-cancerous (adenomatous) colon polyps: 1995, 2011 3) family history of colon cancer: father. MEDICATIONS:   MAC sedation, administered by CRNA, propofol (Diprivan) 200 mg IV DESCRIPTION OF PROCEDURE:   After the risks benefits and alternatives of the procedure were thoroughly explained, informed consent was obtained.  Digital rectal exam was performed and revealed no abnormalities.   The [id number] endoscope was introduced through the anus and advanced to the cecum, which was identified by both the appendix and ileocecal valve, with a tortuous and redundant colon.   The quality of the prep was good, using MoviPrep.  The instrument was then slowly withdrawn as the colon was fully examined. <<PROCEDUREIMAGES>> FINDINGS:  A sessile polyp was found in the mid transverse colon. It was 5 mm in size. The polyp was removed using cold biopsy forceps.  Prior polyectomy site at the hepatic flexure. Moderate diverticulosis was found in the sigmoid to descending colon. Otherwise normal colonoscopy without other polyps, masses, vascular ectasias, or inflammatory changes.  Retroflexed views in the rectum revealed no abnormalities.   The time to cecum =  7.25 minutes. The scope was then withdrawn (time =  12.5  min) from the patient and the procedure completed.  COMPLICATIONS:  None  ENDOSCOPIC IMPRESSION: 1) 5 mm sessile polyp in the mid transverse colon 2) Prior polypectomy site at the hepatic flexure 3) Moderate diverticulosis in the sigmoid to descending colon  RECOMMENDATIONS: 1) Await pathology  results 2) High fiber diet with liberal fluid intake. 3) Repeat Colonoscopy in 3 years.  Venita Lick. Russella Dar, MD, [facility]  CC:  Creola Corn, MD  n. Rosalie DoctorVenita Lick. Benjimin Hadden at 05/04/2011 11:30 AM  Darcella Gasman, [phone]

## 2011-05-05 ENCOUNTER — Telehealth: Payer: Self-pay | Admitting: *Deleted

## 2011-05-05 NOTE — Telephone Encounter (Signed)
  Follow up Call-      Spoke with daughter who answered phone @ ph # left for callback. Pt. Still asleep but daughter says she hasn't had any problems.  Call back number 05/04/2011  Post procedure Call Back phone  # [phone]  Permission to leave phone message Yes     Patient questions:

## 2011-05-10 ENCOUNTER — Encounter: Payer: Self-pay | Admitting: Gastroenterology

## 2011-05-10 NOTE — Procedures (Unsigned)
CAROTID DUPLEX EXAM  INDICATION:  Right carotid stenosis  HISTORY: Diabetes:  No Cardiac:  No Hypertension:  Yes Smoking:  Yes Previous Surgery:  No CV History:  Currently asymptomatic Amaurosis Fugax No, Paresthesias No, Hemiparesis No                                      RIGHT             LEFT Brachial systolic pressure:         134               130 Brachial Doppler waveforms:         Normal            Normal Vertebral direction of flow:        Antegrade/resistive                 Antegrade DUPLEX VELOCITIES (cm/sec) CCA peak systolic                   81                83 ECA peak systolic                   207               145 ICA peak systolic                   187               90 ICA end diastolic                   40                29 PLAQUE MORPHOLOGY:                  Heterogeneous     Heterogeneous PLAQUE AMOUNT:                      Moderate          Minimal PLAQUE LOCATION:                    ICA, ECA          ICA, ECA, CCA  IMPRESSION: 1. Right internal carotid artery velocity suggests 40%-59% stenosis. 2. Left internal carotid artery velocity suggests 1%-39% stenosis. 3. Abnormal right vertebral artery. 4. External carotid artery stenosis bilaterally.  ___________________________________________ V. Charlena Cross, MD  EM/MEDQ  D:  04/23/2011  T:  04/23/2011  Job:  [id number]

## 2011-06-22 ENCOUNTER — Emergency Department (HOSPITAL_COMMUNITY): Payer: Medicare Other

## 2011-06-22 ENCOUNTER — Encounter (HOSPITAL_COMMUNITY): Payer: Self-pay

## 2011-06-22 ENCOUNTER — Emergency Department (HOSPITAL_COMMUNITY)
Admission: EM | Admit: 2011-06-22 | Discharge: 2011-06-22 | Disposition: A | Discharge status: Home / Self Care | Payer: Medicare Other | Attending: Emergency Medicine | Admitting: Emergency Medicine

## 2011-06-22 DIAGNOSIS — R197 Diarrhea, unspecified: Secondary | ICD-10-CM | POA: Insufficient documentation

## 2011-06-22 DIAGNOSIS — D3500 Benign neoplasm of unspecified adrenal gland: Secondary | ICD-10-CM | POA: Insufficient documentation

## 2011-06-22 DIAGNOSIS — E785 Hyperlipidemia, unspecified: Secondary | ICD-10-CM | POA: Insufficient documentation

## 2011-06-22 DIAGNOSIS — K449 Diaphragmatic hernia without obstruction or gangrene: Secondary | ICD-10-CM | POA: Insufficient documentation

## 2011-06-22 DIAGNOSIS — J449 Chronic obstructive pulmonary disease, unspecified: Secondary | ICD-10-CM | POA: Insufficient documentation

## 2011-06-22 DIAGNOSIS — J4489 Other specified chronic obstructive pulmonary disease: Secondary | ICD-10-CM | POA: Insufficient documentation

## 2011-06-22 DIAGNOSIS — Z79899 Other long term (current) drug therapy: Secondary | ICD-10-CM | POA: Insufficient documentation

## 2011-06-22 DIAGNOSIS — I10 Essential (primary) hypertension: Secondary | ICD-10-CM | POA: Insufficient documentation

## 2011-06-22 DIAGNOSIS — R112 Nausea with vomiting, unspecified: Secondary | ICD-10-CM

## 2011-06-22 LAB — COMPREHENSIVE METABOLIC PANEL
BUN: 28 mg/dL — ABNORMAL HIGH (ref 6–23)
Calcium: 9 mg/dL (ref 8.4–10.5)
Creatinine, Ser: 0.94 mg/dL (ref 0.50–1.10)
GFR calc Af Amer: 69 mL/min — ABNORMAL LOW (ref >90)
Glucose, Bld: 118 mg/dL — ABNORMAL HIGH (ref 70–99)
Total Protein: 7.9 g/dL (ref 6.0–8.3)

## 2011-06-22 LAB — URINALYSIS, ROUTINE W REFLEX MICROSCOPIC
Bilirubin Urine: NEGATIVE
Glucose, UA: NEGATIVE mg/dL
Ketones, ur: 15 mg/dL — AB
Leukocytes, UA: NEGATIVE
Nitrite: NEGATIVE
Protein, ur: 30 mg/dL — AB
Specific Gravity, Urine: 1.025 (ref 1.005–1.030)
Urobilinogen, UA: 0.2 mg/dL (ref 0.0–1.0)
pH: 6 (ref 5.0–8.0)

## 2011-06-22 LAB — DIFFERENTIAL
Basophils Absolute: 0 K/uL (ref 0.0–0.1)
Basophils Relative: 0 % (ref 0–1)
Eosinophils Absolute: 0 10*3/uL (ref 0.0–0.7)
Eosinophils Relative: 0 % (ref 0–5)
Lymphocytes Relative: 15 % (ref 12–46)
Lymphs Abs: 1 10*3/uL (ref 0.7–4.0)
Monocytes Absolute: 0.8 K/uL (ref 0.1–1.0)
Monocytes Relative: 13 % — ABNORMAL HIGH (ref 3–12)
Neutro Abs: 4.7 K/uL (ref 1.7–7.7)
Neutrophils Relative %: 72 % (ref 43–77)

## 2011-06-22 LAB — CBC
HCT: 44.6 % (ref 36.0–46.0)
Hemoglobin: 15.3 g/dL — ABNORMAL HIGH (ref 12.0–15.0)
MCH: 30.2 pg (ref 26.0–34.0)
MCHC: 34.3 g/dL (ref 30.0–36.0)
MCV: 88 fL (ref 78.0–100.0)
Platelets: 269 10*3/uL (ref 150–400)
RBC: 5.07 MIL/uL (ref 3.87–5.11)
RDW: 14.7 % (ref 11.5–15.5)
WBC: 6.5 K/uL (ref 4.0–10.5)

## 2011-06-22 LAB — COMPREHENSIVE METABOLIC PANEL WITH GFR
ALT: 27 U/L (ref 0–35)
AST: 34 U/L (ref 0–37)
Albumin: 3.9 g/dL (ref 3.5–5.2)
Alkaline Phosphatase: 81 U/L (ref 39–117)
CO2: 21 meq/L (ref 19–32)
Chloride: 98 meq/L (ref 96–112)
GFR calc non Af Amer: 59 mL/min — ABNORMAL LOW (ref >90)
Potassium: 3.5 meq/L (ref 3.5–5.1)
Sodium: 134 meq/L — ABNORMAL LOW (ref 135–145)
Total Bilirubin: 0.2 mg/dL — ABNORMAL LOW (ref 0.3–1.2)

## 2011-06-22 LAB — URINE MICROSCOPIC-ADD ON

## 2011-06-22 LAB — LIPASE, BLOOD: Lipase: 20 U/L (ref 11–59)

## 2011-06-22 MED ORDER — ONDANSETRON 8 MG PO TBDP: 8.0000 mg | ORAL_TABLET | Freq: Two times a day (BID) | ORAL | Status: AC | PRN

## 2011-06-22 MED ORDER — ONDANSETRON HCL 4 MG/2ML IJ SOLN
4.0000 mg | Freq: Once | INTRAMUSCULAR | Status: AC
  Administered 2011-06-22: 4 mg via INTRAVENOUS
  Filled 2011-06-22: qty 2

## 2011-06-22 MED ORDER — SODIUM CHLORIDE 0.9 % IV SOLN
Freq: Once | INTRAVENOUS | Status: AC
  Administered 2011-06-22: 1000 mL/h via INTRAVENOUS

## 2011-06-22 MED ORDER — SODIUM CHLORIDE 0.9 % IV BOLUS (SEPSIS)
500.0000 mL | Freq: Once | INTRAVENOUS | Status: AC
  Administered 2011-06-22: 1000 mL via INTRAVENOUS

## 2011-06-22 MED ORDER — IOHEXOL 300 MG/ML  SOLN
100.0000 mL | Freq: Once | INTRAMUSCULAR | Status: AC | PRN
  Administered 2011-06-22: 100 mL via INTRAVENOUS

## 2011-06-22 MED ORDER — SODIUM CHLORIDE 0.9 % IV BOLUS (SEPSIS): 500.0000 mL | Freq: Once | INTRAVENOUS | Status: DC

## 2011-06-22 MED ORDER — FENTANYL CITRATE 0.05 MG/ML IJ SOLN
50.0000 ug | Freq: Once | INTRAMUSCULAR | Status: AC
  Administered 2011-06-22: 50 ug via INTRAVENOUS
  Filled 2011-06-22: qty 2

## 2011-06-22 NOTE — ED Provider Notes (Signed)
History     CSN: [id number]  Arrival date & time 06/22/11  7846   First MD Initiated Contact with Patient 06/22/11 [phone]      Chief Complaint  Patient presents with  . Nausea  . Emesis  . Diarrhea    (Consider location/radiation/quality/duration/timing/severity/associated sxs/prior treatment) HPI Comments: Patient presents from home do to persistent nausea vomiting, diarrhea and some diffuse, moving abdominal discomfort. The patient did take care of many grandchildren one to 2 weeks ago and a few of them did come down with neuro virus according to family. Patient reports developing some diffuse abdominal pain followed by multiple bouts of vomiting and a few diarrheal stools approximately 2 and half days ago. The patient has been trying to take Pepto-Bismol and treat her symptoms at home. She reports some subjective fevers and chills. She reports there is some radiation of pain into her back. She complained of some burning discomfort in her upper chest near her throat which she attributes to reflux and her vomiting. She denies specific chest pain, shortness of breath. She denies diaphoresis. She reports the past 24 hours, her vomiting has slowed down has only had about 5 episodes. But the diarrhea has gotten worse and she has had about 15-20 episodes of watery loose stool that has not been bloody or black. Her primary care physician is Dr. Timothy Lasso, and they did speak to them yesterday who encouraged her to come to the emergency department yesterday however the patient did not wish to come. She finally came today because she is feeling extremely lightheaded which is worse when she stands up and tries to walk.  Patient is a 71 y.o. female presenting with vomiting and diarrhea. The history is provided by the patient and a relative.  Emesis  Associated symptoms include abdominal pain and diarrhea. Pertinent negatives include no cough.  Diarrhea The primary symptoms include fatigue, abdominal pain,  nausea, vomiting and diarrhea. Primary symptoms do not include rash.  The illness is also significant for back pain.    Past Medical History  Diagnosis Date  . Hyperlipidemia   . Hypertension   . Anxiety   . Cataracts, bilateral   . COPD (chronic obstructive pulmonary disease)   . Neuromuscular disorder   . Polio 1959  . Stomach ulcer 1960    Past Surgical History  Procedure Date  . Colonoscopy   . Colonoscopy 10/14/09  . Polypectomy 2011    x8  . Vaginal hysterectomy 1970    uterine cancer    Family History  Problem Relation Age of Onset  . Colon cancer Father   . Esophageal cancer Brother   . Esophageal cancer Brother   . Stomach cancer Neg Hx   . Rectal cancer Neg Hx     History  Substance Use Topics  . Smoking status: Current Everyday Smoker -- 0.1 packs/day    Types: Cigarettes  . Smokeless tobacco: Never Used  . Alcohol Use: No    OB History    Grav Para Term Preterm Abortions TAB SAB Ect Mult Living                  Review of Systems  Constitutional: Positive for fatigue.  HENT: Negative for congestion and sore throat.   Respiratory: Negative for cough and shortness of breath.   Cardiovascular: Positive for chest pain. Negative for leg swelling.  Gastrointestinal: Positive for nausea, vomiting, abdominal pain, diarrhea and abdominal distention. Negative for blood in stool.  Musculoskeletal: Positive for back pain.  Skin: Negative for rash and wound.  Neurological: Positive for dizziness and light-headedness.  All other systems reviewed and are negative.    Allergies  Review of patient's allergies indicates no known allergies.  Home Medications   Current Outpatient Rx  Name Route Sig Dispense Refill  . AMITRIPTYLINE HCL 100 MG PO TABS Oral Take 100 mg by mouth at bedtime.    Marland Kitchen AMLODIPINE BESYLATE 5 MG PO TABS Oral Take 5 mg by mouth daily.    . ASPIRIN EC 81 MG PO TBEC Oral Take 81 mg by mouth daily.    . ATENOLOL 50 MG PO TABS Oral Take 50  mg by mouth daily.    . ATORVASTATIN CALCIUM 40 MG PO TABS Oral Take 20 mg by mouth daily.    Marland Kitchen CALCIUM CARBONATE 600 MG PO TABS Oral Take 600 mg by mouth 2 (two) times daily with a meal.    . EZETIMIBE 10 MG PO TABS Oral Take 10 mg by mouth daily.    Marland Kitchen LOSARTAN POTASSIUM-HCTZ 100-25 MG PO TABS Oral Take 1 tablet by mouth daily.    Marland Kitchen PRESERVISION AREDS 2 PO CAPS Oral Take 2 tablets by mouth 2 (two) times daily.    Marland Kitchen ALIGN PO Oral Take 1 capsule by mouth daily.    Marland Kitchen ONDANSETRON 8 MG PO TBDP Oral Take 1 tablet (8 mg total) by mouth every 12 (twelve) hours as needed for nausea. 20 tablet 0    BP 126/51  Pulse 101  Temp(Src) 98.3 F (36.8 C) (Oral)  Resp 16  SpO2 95%  Physical Exam  Nursing note and vitals reviewed. Constitutional: She is oriented to person, place, and time. She appears well-developed and well-nourished.  Non-toxic appearance. She appears ill.  HENT:  Head: Normocephalic and atraumatic.  Mouth/Throat: Uvula is midline. Mucous membranes are dry.  Eyes: Pupils are equal, round, and reactive to light. No scleral icterus.  Neck: Normal range of motion. Neck supple.  Pulmonary/Chest: Effort normal. No respiratory distress. She has no wheezes. She has no rales.  Abdominal: Soft. She exhibits no distension. There is tenderness. There is no rebound.  Neurological: She is alert and oriented to person, place, and time.  Skin: Skin is warm. No rash noted.  Psychiatric: She has a normal mood and affect.    ED Course  Procedures (including critical care time)  Labs Reviewed  CBC - Abnormal; Notable for the following:    Hemoglobin 15.3 (*)    All other components within normal limits  DIFFERENTIAL - Abnormal; Notable for the following:    Monocytes Relative 13 (*)    All other components within normal limits  COMPREHENSIVE METABOLIC PANEL - Abnormal; Notable for the following:    Sodium 134 (*)    Glucose, Bld 118 (*)    BUN 28 (*)    Total Bilirubin 0.2 (*)    GFR calc  non Af Amer 59 (*)    GFR calc Af Amer 69 (*)    All other components within normal limits  URINALYSIS, ROUTINE W REFLEX MICROSCOPIC - Abnormal; Notable for the following:    APPearance CLOUDY (*)    Hgb urine dipstick TRACE (*)    Ketones, ur 15 (*)    Protein, ur 30 (*)    All other components within normal limits  URINE MICROSCOPIC-ADD ON - Abnormal; Notable for the following:    Squamous Epithelial / LPF FEW (*)    Bacteria, UA FEW (*)    All other  components within normal limits  LIPASE, BLOOD   Ct Abdomen Pelvis W Contrast  06/22/2011  *RADIOLOGY REPORT*  Clinical Data: Nausea, vomiting, diarrhea, abdominal pain, possible obstruction.  History of uterine cancer in 1970.  CT ABDOMEN AND PELVIS WITH CONTRAST  Technique:  Multidetector CT imaging of the abdomen and pelvis was performed following the standard protocol during bolus administration of intravenous contrast.  Contrast: OMNIPAQUE IOHEXOL 300 MG/ML  SOLN  Comparison: Visual comparison to CT chest dated 12/28/2010.  Findings: Mild subpleural reticulation/dependent atelectasis in the bilateral lower lobes, right greater than left.  Small hiatal hernia.  Liver is notable for suspected mild hepatic steatosis with fatty sparing along the gallbladder fossa.  Spleen, pancreas, and right adrenal gland are within normal limits.  Stable left adrenal nodularity, with greater than that present washout on delayed images, compatible with an adenoma.  Gallbladder is unremarkable.  No intrahepatic or extrahepatic ductal dilatation.  Kidneys within normal limits.  No hydronephrosis.  No evidence of bowel obstruction.  Normal appendix.  Cecum is decompressed/mildly distorted and notable for a suspected fatty lesion/lipoma (series 2/image 60).  Distal colonic diverticulosis without associated inflammatory changes.  Atherosclerotic calcifications of the abdominal aorta and branch vessels.  No abdominopelvic ascites.  No suspicious abdominopelvic  lymphadenopathy.  Status post hysterectomy.  Ovaries are unremarkable.  Bladder is within normal limits.  Very mild degenerative changes of the lower thoracic spine.  IMPRESSION: No evidence of bowel obstruction.  Normal appendix.  Suspected cecal lipoma.  Colonic diverticulosis, without associated inflammatory changes.  Suspected mild hepatic steatosis.  Stable left adrenal adenoma.  Original Report Authenticated By: Charline Bills, M.D.   Dg Abd Acute W/chest  06/22/2011  *RADIOLOGY REPORT*  Clinical Data: Nausea and vomiting.  Smoker.  ACUTE ABDOMEN SERIES (ABDOMEN 2 VIEW & CHEST 1 VIEW)  Comparison: CT chest 12/28/2010 and pelvis radiograph 04/14/2007.  Findings: Emphysematous changes are noted.  There is symmetric pleuroparenchymal scarring at the lung apices.  Heart size is normal.  There is atherosclerotic calcification of the thoracic aorta.  Pericardial fat pad noted on the left.  No evidence of pleural effusion or airspace disease.  There are several dilated loops of bowel in the abdomen, some of which contain air-fluid levels on the upright view.  Several of these loops have features compatible with small bowel loops.  A bowel loop dilated to 8 cm in the right upper quadrant is favored to be the hepatic flexure of the colon.  The distal colon appears decompressed. No significant stool burden.  No free intraperitoneal air is identified. No radiopaque calculus is seen. No acute or suspicious bony abnormality  IMPRESSION:  1.  Multiple dilated bowel loops (favored to be a combination of small bowel and proximal colon) with air-fluid levels.  Bowel obstruction cannot be excluded. A bowel loop in the right abdomen, favored to be colon, is significantly dilated to 8 cm.  CT of the abdomen pelvis with intravenous contrast may be useful for further evaluation. 2.  Emphysema.  No acute cardiopulmonary disease identified.  Original Report Authenticated By: Britta Mccreedy, M.D.     1. Nausea vomiting and  diarrhea     3:22 PM Pt feels much improved, CT shows no obstruction or sig infectious process. If pt can tolerate PO's, will d./c home with prescriptions.  Abd is soft, no rebound on exam currently.    MDM  Patient's history and examination is somewhat consistent with gastroenteritis. She is ill-appearing but not toxic. Her heart rate  is 68 blood pressure is normal at 105/62. She does have some diffuse mild abdominal discomfort without guarding or rebound. However I did review the patient's plain films and according to the radiologist, multiple dilated loops of intestine are present. A CT scan has been ordered with contrast to assess for bowel obstruction. Patient otherwise is being given IV fluids, IV antiemetics and IV analgesics for her symptoms.       Gavin Pound. Ike Maragh, MD 06/22/11 1600

## 2011-06-22 NOTE — ED Notes (Signed)
Fluid and cracker chalenage done

## 2011-06-22 NOTE — ED Notes (Signed)
Pt in form home with c/o n/v/d since Sunday c/o body aches and weakness

## 2012-01-25 ENCOUNTER — Other Ambulatory Visit: Payer: Self-pay | Admitting: Internal Medicine

## 2012-01-25 DIAGNOSIS — R911 Solitary pulmonary nodule: Secondary | ICD-10-CM

## 2012-01-27 ENCOUNTER — Ambulatory Visit (INDEPENDENT_AMBULATORY_CARE_PROVIDER_SITE_OTHER): Payer: Medicare Other | Admitting: *Deleted

## 2012-01-27 DIAGNOSIS — M79609 Pain in unspecified limb: Secondary | ICD-10-CM

## 2012-01-27 DIAGNOSIS — M79673 Pain in unspecified foot: Secondary | ICD-10-CM

## 2012-01-28 ENCOUNTER — Ambulatory Visit
Admission: RE | Admit: 2012-01-28 | Discharge: 2012-01-28 | Disposition: A | Discharge status: Home / Self Care | Payer: Medicare Other | Source: Ambulatory Visit | Attending: Internal Medicine | Admitting: Internal Medicine

## 2012-01-28 DIAGNOSIS — R911 Solitary pulmonary nodule: Secondary | ICD-10-CM

## 2012-07-24 ENCOUNTER — Ambulatory Visit
Admission: RE | Admit: 2012-07-24 | Discharge: 2012-07-24 | Disposition: A | Discharge status: Home / Self Care | Payer: Medicare Other | Source: Ambulatory Visit | Attending: Allergy and Immunology | Admitting: Allergy and Immunology

## 2012-07-24 ENCOUNTER — Other Ambulatory Visit: Payer: Self-pay | Admitting: Allergy and Immunology

## 2012-07-24 DIAGNOSIS — R0602 Shortness of breath: Secondary | ICD-10-CM

## 2012-08-17 ENCOUNTER — Encounter: Payer: Self-pay | Admitting: Internal Medicine

## 2012-08-17 ENCOUNTER — Ambulatory Visit (INDEPENDENT_AMBULATORY_CARE_PROVIDER_SITE_OTHER): Payer: Medicare Other | Admitting: Internal Medicine

## 2012-08-17 VITALS — BP 114/60 | HR 77 | Temp 97.7°F | Ht 67.75 in | Wt 157.6 lb

## 2012-08-17 DIAGNOSIS — J479 Bronchiectasis, uncomplicated: Secondary | ICD-10-CM

## 2012-08-17 DIAGNOSIS — R05 Cough: Secondary | ICD-10-CM

## 2012-08-17 DIAGNOSIS — J449 Chronic obstructive pulmonary disease, unspecified: Secondary | ICD-10-CM | POA: Insufficient documentation

## 2012-08-17 DIAGNOSIS — R059 Cough, unspecified: Secondary | ICD-10-CM

## 2012-08-17 NOTE — Patient Instructions (Addendum)
Please schedule a follow up office visit in 6 weeks, call sooner if needed with pft's   For cough best recommendation would be mucinex or mucinex dm as needed

## 2012-08-17 NOTE — Progress Notes (Signed)
  Subjective:    Patient ID: Heidi Hobbs, female    DOB: [birth year]  MRN: [id number]  HPI  11 yowf quit smoking 2013 with cough that improved but didn't completely resolve  s llimited by sob then breathing worse in April 2014 while being being treated by  Dr Madie Reno  For hives, rx with prn Ventolin but didn't help resting sob.  08/17/2012 1st pulmonary cc indolent cough x sev years better p quit smoking, not limited by breathing. Cough is mostly clear more p shower, and in ams also but doesn't disturb sleep.  No obvious daytime variabilty or assoc  cp or chest tightness, subjective wheeze overt sinus or hb symptoms. No unusual exp hx or h/o childhood pna/ asthma or premature birth to her knowledge.   Sleeping ok without nocturnal  or early am exacerbation  of respiratory  c/o's or need for noct saba. Also denies any obvious fluctuation of symptoms with weather or environmental changes or other aggravating or alleviating factors except as outlined above.   Review of Systems  Constitutional: Negative for fever, chills and unexpected weight change.  HENT: Negative for ear pain, nosebleeds, congestion, sore throat, rhinorrhea, sneezing, trouble swallowing, dental problem, voice change, postnasal drip and sinus pressure.   Eyes: Negative for visual disturbance.  Respiratory: Positive for cough and shortness of breath. Negative for choking.   Cardiovascular: Positive for leg swelling. Negative for chest pain.  Gastrointestinal: Negative for vomiting, abdominal pain and diarrhea.  Genitourinary: Negative for difficulty urinating.  Musculoskeletal: Negative for arthralgias.  Skin: Positive for rash.  Neurological: Negative for tremors, syncope and headaches.  Hematological: Does not bruise/bleed easily.       Objective:   Physical Exam  Edentulous Wt Readings from Last 3 Encounters:  08/17/12 157 lb 9.6 oz (71.487 kg)  05/04/11 156 lb (70.761 kg)  04/20/11 156 lb 6.4 oz (70.943 kg)     HEENT mild turbinate edema.  Oropharynx no thrush or excess pnd or cobblestoning.  No JVD or cervical adenopathy. Mild accessory muscle hypertrophy. Trachea midline, nl thryroid. Chest was hyperinflated by percussion with diminished breath sounds and moderate increased exp time without wheeze. Hoover sign positive at mid inspiration. Regular rate and rhythm without murmur gallop or rub or increase P2 or edema.  Abd: no hsm, nl excursion. Ext warm without cyanosis or clubbing.     01/28/12 CT chest Mild bronchiectatic change in the right middle lobe and  lingula. In view of these changes, MAC would be a consideration.   07/24/12 Evidence of chronic lung disease. No sign of active infiltrate,  collapse or effusion      Assessment & Plan:

## 2012-08-18 DIAGNOSIS — R059 Cough, unspecified: Secondary | ICD-10-CM | POA: Insufficient documentation

## 2012-08-18 DIAGNOSIS — R05 Cough: Secondary | ICD-10-CM | POA: Insufficient documentation

## 2012-08-18 DIAGNOSIS — J479 Bronchiectasis, uncomplicated: Secondary | ICD-10-CM | POA: Insufficient documentation

## 2012-08-18 NOTE — Assessment & Plan Note (Addendum)
The most common causes of chronic cough in immunocompetent adults include the following: upper airway cough syndrome (UACS), previously referred to as postnasal drip syndrome (PNDS), which is caused by variety of rhinosinus conditions; (2) asthma; (3) GERD; (4) chronic bronchitis from cigarette smoking or other inhaled environmental irritants; (5) nonasthmatic eosinophilic bronchitis; and (6) bronchiectasis.   These conditions, singly or in combination, have accounted for up to 94% of the causes of chronic cough in prospective studies.   Other conditions have constituted no >6% of the causes in prospective studies These have included bronchogenic carcinoma, chronic interstitial pneumonia, sarcoidosis, left ventricular failure, ACEI-induced cough, and aspiration from a condition associated with pharyngeal dysfunction.    Chronic cough is often simultaneously caused by more than one condition. A single cause has been found from 38 to 82% of the time, multiple causes from 18 to 62%. Multiply caused cough has been the result of three diseases up to 42% of the time.       althouth she has bronchiectasis this is not a cough typical of that syndrome but more of an upper airway issue I suspect.  rec Reviewed diet for gerd, reinforced complicance with acid reducing meds,  Plan to regroup with her in 6 weeks

## 2012-08-18 NOTE — Assessment & Plan Note (Addendum)
Clinically mild to moderate and don't necessarily require any rx at this point and note has already failed to respond to albuterol typical of up to about  65-70% of copd pts in some studies   I reviewed the Flethcher curve with patient that basically indicates  if you quit smoking when your best day FEV1 is still well preserved it is highly unlikely you will progress to severe disease and informed the patient there was no medication on the market that has proven to change the curve or the likelihood of progression.  Therefore stopping smoking and maintaining abstinence is the most important aspect of care, not choice of inhalers or for that matter, doctors.    For now would like to see where she is "on the curve" and go from there.

## 2012-08-18 NOTE — Assessment & Plan Note (Signed)
Although there are clearly abnormalities on CT scan, they should probably be considered "microscopic" since not obvious on plain cxr .     In the setting of obvious "macroscopic" health issues,  I am very reluctatnt to embark on an invasive w/u at this point but will arrange consevative  follow up and in the meantime see what we can do to address the patient's subjective concerns.   

## 2012-08-31 ENCOUNTER — Institutional Professional Consult (permissible substitution): Payer: Medicare Other | Admitting: Emergency Medicine

## 2012-09-27 ENCOUNTER — Ambulatory Visit (INDEPENDENT_AMBULATORY_CARE_PROVIDER_SITE_OTHER): Payer: 59 | Admitting: Internal Medicine

## 2012-09-27 ENCOUNTER — Encounter: Payer: Self-pay | Admitting: Internal Medicine

## 2012-09-27 VITALS — BP 124/82 | HR 78 | Temp 97.7°F | Ht 67.5 in | Wt 159.0 lb

## 2012-09-27 DIAGNOSIS — J449 Chronic obstructive pulmonary disease, unspecified: Secondary | ICD-10-CM

## 2012-09-27 DIAGNOSIS — J479 Bronchiectasis, uncomplicated: Secondary | ICD-10-CM

## 2012-09-27 DIAGNOSIS — R05 Cough: Secondary | ICD-10-CM

## 2012-09-27 DIAGNOSIS — R059 Cough, unspecified: Secondary | ICD-10-CM

## 2012-09-27 LAB — PULMONARY FUNCTION TEST

## 2012-09-27 NOTE — Progress Notes (Signed)
PFT done today. 

## 2012-09-27 NOTE — Assessment & Plan Note (Signed)
-   PFT's FEV1 FEV1  2.32 (93%) ratio 68 with no change p B2, DLCO 62 with correcttion to 64%  As I explained to this patient in detail:  although there may be slt copd present, it does not appear to be limiting activity tolerance  Or contributing to symptoms.  I reviewed the Flethcher curve with patient that basically indicates  if you quit smoking when your best day FEV1 is still well preserved (as hers is)  it is highly unlikely you will progress to severe disease and informed the patient there was no medication on the market that has proven to change the curve or the likelihood of progression.  Therefore stopping smoking and maintaining abstinence is the most important aspect of care, not choice of inhalers or for that matter, doctors.

## 2012-09-27 NOTE — Progress Notes (Signed)
  Subjective:    Patient ID: Heidi Hobbs, female    DOB: [birth year]  MRN: [id number]   Brief patient profile:  27 yowf quit smoking 2013 with cough that improved but didn't completely resolve  s llimited by sob then breathing worse in April 2014 while being being treated by  Dr Madie Reno  For hives, rx with prn Ventolin but didn't help resting sob.  08/17/2012 1st pulmonary cc indolent cough x sev years better p quit smoking, not limited by breathing. Cough is mostly clear more p shower, and in ams also but doesn't disturb sleep. rec Mucinex or mucinex dm prn  09/27/2012 f/u ov/[name] returns for pft Chief Complaint  Patient presents with  . Followup with PFT    Breathing is unchanged since the last visit. No new co's today.   took high dose prednisone for hives, no perceived benefit on breathing but not typically limited by breathing, min cough does not feel it's bad enough to even use mucinex.  No obvious daytime variabilty or assoc  cp or chest tightness, subjective wheeze overt sinus or hb symptoms. No unusual exp hx or h/o childhood pna/ asthma or premature birth to her knowledge.   Sleeping ok without nocturnal  or early am exacerbation  of respiratory  c/o's or need for noct saba. Also denies any obvious fluctuation of symptoms with weather or environmental changes or other aggravating or alleviating factors except as outlined above.   Current Medications, Allergies, Past Medical History, Past Surgical History, Family History, and Social History were reviewed in [employer] record.  ROS  The following are not active complaints unless bolded sore throat, dysphagia, dental problems, itching, sneezing,  nasal congestion or excess/ purulent secretions, ear ache,   fever, chills, sweats, unintended wt loss, pleuritic or exertional cp, hemoptysis,  orthopnea pnd or leg swelling, presyncope, palpitations, heartburn, abdominal pain, anorexia, nausea, vomiting, diarrhea   or change in bowel or urinary habits, change in stools or urine, dysuria,hematuria,  rash, arthralgias, visual complaints, headache, numbness weakness or ataxia or problems with walking or coordination,  change in mood/affect or memory.            Objective:   Physical Exam  Edentulous Wt Readings from Last 3 Encounters:  08/17/12 157 lb 9.6 oz (71.487 kg)  05/04/11 156 lb (70.761 kg)  04/20/11 156 lb 6.4 oz (70.943 kg)    HEENT mild turbinate edema.  Oropharynx no thrush or excess pnd or cobblestoning.  No JVD or cervical adenopathy. Mild accessory muscle hypertrophy. Trachea midline, nl thryroid. Chest was min hyperinflated by percussion with slt diminished breath sounds and min increased exp time without wheeze. Hoover sign positive at end inspiration. Regular rate and rhythm without murmur gallop or rub or increase P2 or edema.  Abd: no hsm, nl excursion. Ext warm without cyanosis or clubbing.     01/28/12 CT chest Mild bronchiectatic change in the right middle lobe and  lingula. In view of these changes, MAC would be a consideration.   07/24/12 Evidence of chronic lung disease. No sign of active infiltrate,  collapse or effusion      Assessment & Plan:

## 2012-09-27 NOTE — Assessment & Plan Note (Signed)
01/28/12 CT chest Mild bronchiectatic change in the right middle lobe and  lingula. In view of these changes, MAC would be a consideration  No indication for rx at this point x mucinex or mucinex dm prn and rx purulent exac with levaquin best choice.  Pulmonary f/u can be prn

## 2012-09-27 NOTE — Patient Instructions (Addendum)
For cough best recommendation would be mucinex or mucinex dm as needed   You do not have significant copd nor will you ever likely have this problem unless you resume smoking.

## 2012-10-16 ENCOUNTER — Encounter: Payer: Self-pay | Admitting: Internal Medicine

## 2012-11-24 ENCOUNTER — Other Ambulatory Visit: Payer: Self-pay | Admitting: Internal Medicine

## 2012-11-24 DIAGNOSIS — R221 Localized swelling, mass and lump, neck: Secondary | ICD-10-CM

## 2012-11-27 ENCOUNTER — Ambulatory Visit
Admission: RE | Admit: 2012-11-27 | Discharge: 2012-11-27 | Disposition: A | Discharge status: Home / Self Care | Payer: 59 | Source: Ambulatory Visit | Attending: Internal Medicine | Admitting: Internal Medicine

## 2012-11-27 DIAGNOSIS — R221 Localized swelling, mass and lump, neck: Secondary | ICD-10-CM

## 2012-12-11 DIAGNOSIS — L508 Other urticaria: Secondary | ICD-10-CM | POA: Insufficient documentation

## 2013-05-29 ENCOUNTER — Other Ambulatory Visit: Payer: Self-pay | Admitting: Internal Medicine

## 2013-05-29 DIAGNOSIS — R911 Solitary pulmonary nodule: Secondary | ICD-10-CM

## 2013-05-30 ENCOUNTER — Ambulatory Visit (HOSPITAL_COMMUNITY)
Admission: RE | Admit: 2013-05-30 | Discharge: 2013-05-30 | Disposition: A | Discharge status: Home / Self Care | Payer: Medicare Other | Source: Ambulatory Visit | Attending: Vascular Surgery | Admitting: Vascular Surgery

## 2013-05-30 ENCOUNTER — Other Ambulatory Visit (HOSPITAL_COMMUNITY): Payer: Self-pay | Admitting: Internal Medicine

## 2013-05-30 DIAGNOSIS — I6529 Occlusion and stenosis of unspecified carotid artery: Secondary | ICD-10-CM

## 2013-06-01 ENCOUNTER — Ambulatory Visit
Admission: RE | Admit: 2013-06-01 | Discharge: 2013-06-01 | Disposition: A | Discharge status: Home / Self Care | Payer: Medicare Other | Source: Ambulatory Visit | Attending: Internal Medicine | Admitting: Internal Medicine

## 2013-06-01 DIAGNOSIS — R911 Solitary pulmonary nodule: Secondary | ICD-10-CM

## 2014-02-19 ENCOUNTER — Encounter: Payer: Self-pay | Admitting: Gastroenterology

## 2014-04-12 ENCOUNTER — Ambulatory Visit: Payer: Medicare Other | Admitting: Gastroenterology

## 2014-04-23 ENCOUNTER — Ambulatory Visit: Payer: Medicare Other | Admitting: Gastroenterology

## 2014-04-27 ENCOUNTER — Encounter: Payer: Self-pay | Admitting: Gastroenterology

## 2014-07-08 ENCOUNTER — Ambulatory Visit (INDEPENDENT_AMBULATORY_CARE_PROVIDER_SITE_OTHER)
Admission: RE | Admit: 2014-07-08 | Discharge: 2014-07-08 | Disposition: A | Discharge status: Home / Self Care | Payer: Medicare Other | Source: Ambulatory Visit | Attending: Vascular Surgery | Admitting: Vascular Surgery

## 2014-07-08 ENCOUNTER — Other Ambulatory Visit (HOSPITAL_COMMUNITY): Payer: Self-pay | Admitting: Internal Medicine

## 2014-07-08 ENCOUNTER — Ambulatory Visit (HOSPITAL_COMMUNITY)
Admission: RE | Admit: 2014-07-08 | Discharge: 2014-07-08 | Disposition: A | Discharge status: Home / Self Care | Payer: Medicare Other | Source: Ambulatory Visit | Attending: Cardiovascular Disease | Admitting: Cardiovascular Disease

## 2014-07-08 DIAGNOSIS — I6521 Occlusion and stenosis of right carotid artery: Secondary | ICD-10-CM

## 2014-07-08 DIAGNOSIS — R0600 Dyspnea, unspecified: Secondary | ICD-10-CM

## 2014-07-08 DIAGNOSIS — R0602 Shortness of breath: Secondary | ICD-10-CM

## 2014-07-08 DIAGNOSIS — I081 Rheumatic disorders of both mitral and tricuspid valves: Secondary | ICD-10-CM | POA: Diagnosis not present

## 2014-07-08 NOTE — Progress Notes (Signed)
2D Echo Performed 07/08/2014    Marygrace Drought, RCS

## 2014-07-16 ENCOUNTER — Telehealth: Payer: Self-pay | Admitting: Cardiology

## 2014-07-16 NOTE — Telephone Encounter (Signed)
Closed encounter °

## 2014-07-23 ENCOUNTER — Encounter: Payer: Self-pay | Admitting: Cardiology

## 2014-07-23 ENCOUNTER — Ambulatory Visit (INDEPENDENT_AMBULATORY_CARE_PROVIDER_SITE_OTHER): Payer: Medicare Other | Admitting: Cardiology

## 2014-07-23 VITALS — BP 138/78 | Ht 68.0 in | Wt 153.2 lb

## 2014-07-23 DIAGNOSIS — R079 Chest pain, unspecified: Secondary | ICD-10-CM

## 2014-07-23 NOTE — Progress Notes (Signed)
Cardiology Office Note   Date:  07/23/2014   ID:  Heidi Hobbs, DOB 1940/03/23, MRN [id number]  PCP:  Heidi Reel, MD  Cardiologist:   Heidi Breeding, MD   Chief Complaint  Patient presents with  . Shortness of Breath      History of Present Illness: Heidi Hobbs is a 74 y.o. female who presents for evaluation of dyspnea. She has no past cardiac history. She was found to have a vasculitis recently that has been treated. Echo done recently demonstrated well-preserved ejection fraction. There was some mild pulmonary hypertension calculated.  She says she thinks this has been progressive over about 6 months. She says she can walk maybe 50 yards or so on level ground but she will be short of breath. She is not describing chest pressure, neck or arm discomfort. She is not having PND or orthopnea. She will occasionally get some palpitations at night. She's had no prior stress testing or cardiac catheterization.   Past Medical History  Diagnosis Date  . Hyperlipidemia   . Hypertension   . Anxiety   . Cataracts, bilateral   . COPD (chronic obstructive pulmonary disease)   . Polio 1959  . Stomach ulcer 1960  . MAC (mycobacterium avium-intracellulare complex)   . Adenomatous colon polyp 1995, 2011  . Vasculitis     Normocomplementemic urticarial   . Carotid stenosis     40-59% right, less than 40% left    Past Surgical History  Procedure Laterality Date  . Colonoscopy    . Colonoscopy  10/14/09  . Polypectomy  2011    x8  . Vaginal hysterectomy  1970    uterine cancer  . Tonsillectomy and adenoidectomy    . Cataract extraction    . Breast enhancement surgery      removal     Current Outpatient Prescriptions  Medication Sig Dispense Refill  . amitriptyline (ELAVIL) 100 MG tablet Take 100 mg by mouth at bedtime.    Marland Kitchen amLODipine (NORVASC) 5 MG tablet Take 10 mg by mouth daily.     Marland Kitchen atenolol (TENORMIN) 50 MG tablet Take 50 mg by mouth daily.    Marland Kitchen atorvastatin  (LIPITOR) 40 MG tablet Take 20 mg by mouth daily.    . Cholecalciferol (VITAMIN D3) 2000 UNITS TABS Take 1 tablet by mouth daily.    . Cyanocobalamin (VITAMIN B-12 IJ) Inject as directed every 30 (thirty) days.    . Cyanocobalamin (VITAMIN B-12 PO) Take by mouth. 3000 iu daily    . ezetimibe (ZETIA) 10 MG tablet Take 10 mg by mouth daily.    . montelukast (SINGULAIR) 10 MG tablet Take 10 mg by mouth at bedtime.    . Multiple Vitamins-Minerals (PRESERVISION AREDS 2) CAPS Take 2 tablets by mouth 2 (two) times daily.    . ranitidine (ZANTAC) 150 MG tablet Take 150 mg by mouth 2 (two) times daily.    Marland Kitchen aspirin 81 MG EC tablet Take 81 mg by mouth.     No current facility-administered medications for this visit.    Allergies:   Eggs or egg-derived products    Social History:  The patient  reports that she has been smoking Cigarettes.  She has a 25 pack-year smoking history. She has never used smokeless tobacco. She reports that she does not drink alcohol or use illicit drugs.   Family History:  The patient's family history includes Asthma in her daughter; Colon cancer in her father; Esophageal cancer in her brother  and brother. There is no history of Stomach cancer or Rectal cancer.    ROS:  Please see the history of present illness.   Otherwise, review of systems are positive for none.   All other systems are reviewed and negative.    PHYSICAL EXAM: VS:  BP 138/78 mmHg  Ht 5\' 8"  (1.727 m)  Wt 153 lb 3.2 oz (69.491 kg)  BMI 23.30 kg/m2 , BMI Body mass index is 23.3 kg/(m^2). GENERAL:  Well appearing HEENT:  Pupils equal round and reactive, fundi not visualized, oral mucosa unremarkable NECK:  No jugular venous distention, waveform within normal limits, carotid upstroke brisk and symmetric, right 50% bruits, no thyromegaly LYMPHATICS:  No cervical, inguinal adenopathy LUNGS:  Clear to auscultation bilaterally BACK:  No CVA tenderness CHEST:  Unremarkable HEART:  PMI not displaced or  sustained,S1 and S2 within normal limits, no S3, no S4, no clicks, no rubs, no murmurs ABD:  Flat, positive bowel sounds normal in frequency in pitch, no bruits, no rebound, no guarding, no midline pulsatile mass, no hepatomegaly, no splenomegaly EXT:  2 plus pulses throughout, no edema, no cyanosis no clubbing SKIN:  No rashes no nodules NEURO:  Cranial nerves II through XII grossly intact, motor grossly intact throughout PSYCH:  Cognitively intact, oriented to person place and time    EKG:  EKG is ordered today. The ekg ordered today demonstrates sinus rhythm, rate 73, axis within normal limits, intervals within normal limits, no acute ST-T wave changes.   Recent Labs: No results found for requested labs within last 365 days.    Lipid Panel     Wt Readings from Last 3 Encounters:  07/23/14 153 lb 3.2 oz (69.491 kg)  09/27/12 159 lb (72.122 kg)  08/17/12 157 lb 9.6 oz (71.487 kg)      Other studies Reviewed: Additional studies/ records that were reviewed today include: Echo, outside office records Review of the above records demonstrates:  Please see elsewhere in the note.      ASSESSMENT AND PLAN:  DYSPNEA:  I suspect this is primarily pulmonary. However, she does have significant cardiovascular risk factors. Given this stress testing is indicated. I will bring her back for a POET (Plain Old Exercise Treadmill)  HTN:  The blood pressure is at target. No change in medications is indicated. We will continue with therapeutic lifestyle changes (TLC).  CAROTID STENOSIS:  We we will put her in place for follow-up next year for her nonobstructive disease.    Current medicines are reviewed at length with the patient today.  The patient does not have concerns regarding medicines.  The following changes have been made:  no change  Labs/ tests ordered today include:   Orders Placed This Encounter  Procedures  . Exercise Tolerance Test  . EKG 12-Lead     Disposition:    FU with me as needed    Signed, Heidi Breeding, MD  07/23/2014 4:13 PM    [facility]

## 2014-07-23 NOTE — Patient Instructions (Addendum)
Your physician has requested that you have an exercise tolerance test. For further information please visit [email or web address]. Please also follow instruction sheet, as given.  We will call you with the results.    Your physician recommends that you schedule a follow-up appointment in: As needed.

## 2014-07-26 ENCOUNTER — Ambulatory Visit: Payer: Medicare Other | Admitting: Cardiology

## 2014-08-16 ENCOUNTER — Telehealth (HOSPITAL_COMMUNITY): Payer: Self-pay

## 2014-08-16 NOTE — Telephone Encounter (Signed)
Encounter complete. 

## 2014-08-20 ENCOUNTER — Telehealth (HOSPITAL_COMMUNITY): Payer: Self-pay

## 2014-08-20 NOTE — Telephone Encounter (Signed)
Encounter complete. 

## 2014-08-21 ENCOUNTER — Ambulatory Visit (HOSPITAL_COMMUNITY)
Admission: RE | Admit: 2014-08-21 | Discharge: 2014-08-21 | Disposition: A | Discharge status: Home / Self Care | Payer: Medicare Other | Source: Ambulatory Visit | Attending: Cardiovascular Disease | Admitting: Cardiovascular Disease

## 2014-08-21 DIAGNOSIS — R079 Chest pain, unspecified: Secondary | ICD-10-CM

## 2014-08-21 LAB — EXERCISE TOLERANCE TEST
CHL RATE OF PERCEIVED EXERTION: 16
CSEPHR: 104 %
Estimated workload: 5.8 METS
Exercise duration (min): 4 min
MPHR: 145 {beats}/min
Peak HR: 151 {beats}/min
Rest HR: 104 {beats}/min

## 2014-08-29 NOTE — Telephone Encounter (Signed)
Encounter complete. 

## 2014-11-05 ENCOUNTER — Encounter: Payer: Self-pay | Admitting: Gastroenterology

## 2015-05-19 DIAGNOSIS — J209 Acute bronchitis, unspecified: Secondary | ICD-10-CM | POA: Diagnosis not present

## 2015-05-19 DIAGNOSIS — R05 Cough: Secondary | ICD-10-CM | POA: Diagnosis not present

## 2015-05-19 DIAGNOSIS — R0602 Shortness of breath: Secondary | ICD-10-CM | POA: Diagnosis not present

## 2015-05-19 DIAGNOSIS — Z6823 Body mass index (BMI) 23.0-23.9, adult: Secondary | ICD-10-CM | POA: Diagnosis not present

## 2015-05-19 DIAGNOSIS — J449 Chronic obstructive pulmonary disease, unspecified: Secondary | ICD-10-CM | POA: Diagnosis not present

## 2015-05-19 DIAGNOSIS — R509 Fever, unspecified: Secondary | ICD-10-CM | POA: Diagnosis not present

## 2015-06-10 DIAGNOSIS — M318 Other specified necrotizing vasculopathies: Secondary | ICD-10-CM | POA: Diagnosis not present

## 2015-06-10 DIAGNOSIS — L509 Urticaria, unspecified: Secondary | ICD-10-CM | POA: Diagnosis not present

## 2015-06-16 DIAGNOSIS — R300 Dysuria: Secondary | ICD-10-CM | POA: Diagnosis not present

## 2015-06-16 DIAGNOSIS — N390 Urinary tract infection, site not specified: Secondary | ICD-10-CM | POA: Diagnosis not present

## 2015-06-25 DIAGNOSIS — L508 Other urticaria: Secondary | ICD-10-CM | POA: Diagnosis not present

## 2015-06-25 DIAGNOSIS — M318 Other specified necrotizing vasculopathies: Secondary | ICD-10-CM | POA: Diagnosis not present

## 2015-06-25 DIAGNOSIS — Z79899 Other long term (current) drug therapy: Secondary | ICD-10-CM | POA: Diagnosis not present

## 2015-07-03 DIAGNOSIS — R739 Hyperglycemia, unspecified: Secondary | ICD-10-CM | POA: Diagnosis not present

## 2015-07-03 DIAGNOSIS — I272 Other secondary pulmonary hypertension: Secondary | ICD-10-CM | POA: Diagnosis not present

## 2015-07-03 DIAGNOSIS — Z0000 Encounter for general adult medical examination without abnormal findings: Secondary | ICD-10-CM | POA: Diagnosis not present

## 2015-07-03 DIAGNOSIS — E559 Vitamin D deficiency, unspecified: Secondary | ICD-10-CM | POA: Diagnosis not present

## 2015-07-03 DIAGNOSIS — I10 Essential (primary) hypertension: Secondary | ICD-10-CM | POA: Diagnosis not present

## 2015-07-03 DIAGNOSIS — D519 Vitamin B12 deficiency anemia, unspecified: Secondary | ICD-10-CM | POA: Diagnosis not present

## 2015-07-03 DIAGNOSIS — M109 Gout, unspecified: Secondary | ICD-10-CM | POA: Diagnosis not present

## 2015-07-03 DIAGNOSIS — E784 Other hyperlipidemia: Secondary | ICD-10-CM | POA: Diagnosis not present

## 2015-07-10 ENCOUNTER — Other Ambulatory Visit: Payer: Self-pay | Admitting: Internal Medicine

## 2015-07-10 DIAGNOSIS — Z0000 Encounter for general adult medical examination without abnormal findings: Secondary | ICD-10-CM | POA: Diagnosis not present

## 2015-07-10 DIAGNOSIS — G14 Postpolio syndrome: Secondary | ICD-10-CM | POA: Diagnosis not present

## 2015-07-10 DIAGNOSIS — R3915 Urgency of urination: Secondary | ICD-10-CM | POA: Diagnosis not present

## 2015-07-10 DIAGNOSIS — M5489 Other dorsalgia: Secondary | ICD-10-CM | POA: Diagnosis not present

## 2015-07-10 DIAGNOSIS — I6521 Occlusion and stenosis of right carotid artery: Secondary | ICD-10-CM

## 2015-07-10 DIAGNOSIS — H3530 Unspecified macular degeneration: Secondary | ICD-10-CM | POA: Diagnosis not present

## 2015-07-10 DIAGNOSIS — M310 Hypersensitivity angiitis: Secondary | ICD-10-CM | POA: Diagnosis not present

## 2015-07-10 DIAGNOSIS — J479 Bronchiectasis, uncomplicated: Secondary | ICD-10-CM | POA: Diagnosis not present

## 2015-07-10 DIAGNOSIS — R338 Other retention of urine: Secondary | ICD-10-CM | POA: Diagnosis not present

## 2015-07-10 DIAGNOSIS — I272 Other secondary pulmonary hypertension: Secondary | ICD-10-CM | POA: Diagnosis not present

## 2015-07-10 DIAGNOSIS — R9431 Abnormal electrocardiogram [ECG] [EKG]: Secondary | ICD-10-CM | POA: Diagnosis not present

## 2015-07-16 ENCOUNTER — Ambulatory Visit
Admission: RE | Admit: 2015-07-16 | Discharge: 2015-07-16 | Disposition: A | Discharge status: Home / Self Care | Payer: Medicare Other | Source: Ambulatory Visit | Attending: Internal Medicine | Admitting: Internal Medicine

## 2015-07-16 DIAGNOSIS — I6523 Occlusion and stenosis of bilateral carotid arteries: Secondary | ICD-10-CM | POA: Diagnosis not present

## 2015-07-16 DIAGNOSIS — I6521 Occlusion and stenosis of right carotid artery: Secondary | ICD-10-CM

## 2015-07-21 ENCOUNTER — Other Ambulatory Visit: Payer: Self-pay | Admitting: Vascular Surgery

## 2015-07-21 ENCOUNTER — Other Ambulatory Visit: Payer: Self-pay | Admitting: Pediatrics

## 2015-07-21 DIAGNOSIS — I6521 Occlusion and stenosis of right carotid artery: Secondary | ICD-10-CM

## 2015-07-21 DIAGNOSIS — R3914 Feeling of incomplete bladder emptying: Secondary | ICD-10-CM | POA: Diagnosis not present

## 2015-07-21 DIAGNOSIS — N393 Stress incontinence (female) (male): Secondary | ICD-10-CM | POA: Diagnosis not present

## 2015-07-25 ENCOUNTER — Encounter: Payer: Self-pay | Admitting: Vascular Surgery

## 2015-07-30 ENCOUNTER — Encounter: Payer: Self-pay | Admitting: Gastroenterology

## 2015-07-31 ENCOUNTER — Ambulatory Visit (HOSPITAL_COMMUNITY)
Admission: RE | Admit: 2015-07-31 | Discharge: 2015-07-31 | Disposition: A | Discharge status: Home / Self Care | Payer: Medicare Other | Source: Ambulatory Visit | Attending: Vascular Surgery | Admitting: Vascular Surgery

## 2015-07-31 ENCOUNTER — Other Ambulatory Visit: Payer: Self-pay | Admitting: Vascular Surgery

## 2015-07-31 ENCOUNTER — Encounter: Payer: Self-pay | Admitting: Vascular Surgery

## 2015-07-31 ENCOUNTER — Ambulatory Visit (INDEPENDENT_AMBULATORY_CARE_PROVIDER_SITE_OTHER): Payer: Medicare Other | Admitting: Vascular Surgery

## 2015-07-31 VITALS — BP 180/87 | HR 85 | Temp 98.1°F | Resp 16 | Ht 68.0 in | Wt 154.0 lb

## 2015-07-31 DIAGNOSIS — I6521 Occlusion and stenosis of right carotid artery: Secondary | ICD-10-CM | POA: Insufficient documentation

## 2015-07-31 DIAGNOSIS — I779 Disorder of arteries and arterioles, unspecified: Secondary | ICD-10-CM | POA: Diagnosis not present

## 2015-07-31 DIAGNOSIS — I739 Peripheral vascular disease, unspecified: Principal | ICD-10-CM

## 2015-07-31 NOTE — Progress Notes (Signed)
New Carotid Patient   Referred by:  Shon Baton, MD [address] [place], [place] [address]   Reason for referral: R ICA carotid stenosis   History of Present Illness  Heidi Hobbs is a 75 y.o. (08/25/1940) female who presents with chief complaint: difficulty seeing out of L eye.  Previous carotid studies demonstrated: RICA [id number] stenosis, LICA minimal stenosis.  Patient has no history of TIA or stroke symptom.  The patient has never had amaurosis fugax or monocular blindness.  This patient has had diplopia since she got polio as a child.  Over the last year, her left eye macular degeneration has worsened.  The patient has never had facial drooping or hemiplegia.  The patient has never had receptive or expressive aphasia.  The patient's risks factors for carotid disease include: HLD, HTN, and active smoking.  Past Medical History  Diagnosis Date  . Hyperlipidemia   . Hypertension   . Anxiety   . Cataracts, bilateral   . COPD (chronic obstructive pulmonary disease) ([facility])   . Polio 1959  . Stomach ulcer 1960  . MAC (mycobacterium avium-intracellulare complex)   . Adenomatous colon polyp 1995, 2011  . Vasculitis ([facility])     Normocomplementemic urticarial   . Carotid stenosis     40-59% right, less than 40% left    Past Surgical History  Procedure Laterality Date  . Colonoscopy    . Colonoscopy  10/14/09  . Polypectomy  2011    x8  . Vaginal hysterectomy  1970    uterine cancer  . Tonsillectomy and adenoidectomy    . Cataract extraction    . Breast enhancement surgery      removal    Social History   Social History  . Marital Status: Widowed    Spouse Name: N/A  . Number of Children: 3  . Years of Education: N/A   Occupational History  . Works PT as a Building control surveyor    Social History Main Topics  . Smoking status: Light Tobacco Smoker -- 0.50 packs/day for 50 years    Types: Cigarettes    Last Attempt to Quit: 03/16/2011  . Smokeless tobacco: Never Used      Comment: Rarely smokes now.    . Alcohol Use: No  . Drug Use: No  . Sexual Activity: Not on file   Other Topics Concern  . Not on file   Social History Narrative    Family History  Problem Relation Age of Onset  . Colon cancer Father   . Esophageal cancer Brother   . Esophageal cancer Brother   . Stomach cancer Neg Hx   . Rectal cancer Neg Hx   . Asthma Daughter     as a child    Current Outpatient Prescriptions  Medication Sig Dispense Refill  . amitriptyline (ELAVIL) 100 MG tablet Take 100 mg by mouth at bedtime.    Marland Kitchen amLODipine (NORVASC) 5 MG tablet Take 10 mg by mouth daily.     Marland Kitchen aspirin 81 MG EC tablet Take 81 mg by mouth.    Marland Kitchen atenolol (TENORMIN) 50 MG tablet Take 50 mg by mouth daily.    Marland Kitchen atorvastatin (LIPITOR) 40 MG tablet Take 20 mg by mouth daily.    . budesonide-formoterol (SYMBICORT) 160-4.5 MCG/ACT inhaler Inhale 2 puffs into the lungs 2 (two) times daily as needed.    . Cholecalciferol (VITAMIN D3) 2000 UNITS TABS Take 1 tablet by mouth daily.    . colchicine 0.6 MG tablet  Take 0.6 mg by mouth 2 (two) times daily.    . Cyanocobalamin (VITAMIN B-12 PO) Take by mouth. 3000 iu daily    . dapsone 100 MG tablet Take 50 mg by mouth daily.    Marland Kitchen ezetimibe (ZETIA) 10 MG tablet Take 10 mg by mouth daily.    . montelukast (SINGULAIR) 10 MG tablet Take 10 mg by mouth at bedtime.    . Multiple Vitamins-Minerals (PRESERVISION AREDS 2) CAPS Take 2 tablets by mouth 2 (two) times daily.    . ranitidine (ZANTAC) 150 MG tablet Take 150 mg by mouth 2 (two) times daily.    . Cyanocobalamin (VITAMIN B-12 IJ) Inject as directed every 30 (thirty) days. Reported on 07/31/2015     No current facility-administered medications for this visit.    Allergies  Allergen Reactions  . Eggs Or Egg-Derived Products     Skin nodules     REVIEW OF SYSTEMS:  (Positives checked otherwise negative)  CARDIOVASCULAR:   [ ]  chest pain,  [ ]  chest pressure,  [ ]  palpitations,  [ ]  shortness  of breath when laying flat,  [ ]  shortness of breath with exertion,   [ ]  pain in feet when walking,  [ ]  pain in feet when laying flat, [ ]  history of blood clot in veins (DVT),  [ ]  history of phlebitis,  [ ]  swelling in legs,  [ ]  varicose veins  PULMONARY:   [ ]  productive cough,  [ ]  asthma,  [ ]  wheezing  NEUROLOGIC:   [ ]  weakness in arms or legs,  [ ]  numbness in arms or legs,  [ ]  difficulty speaking or slurred speech,  [x]  temporary loss of vision in one eye,  [ ]  dizziness  HEMATOLOGIC:   [ ]  bleeding problems,  [ ]  problems with blood clotting too easily  MUSCULOSKEL:   [ ]  joint pain, [ ]  joint swelling  GASTROINTEST:   [ ]  vomiting blood,  [ ]  blood in stool     GENITOURINARY:   [ ]  burning with urination,  [ ]  blood in urine  PSYCHIATRIC:   [ ]  history of major depression  INTEGUMENTARY:   [ ]  rashes,  [ ]  ulcers  CONSTITUTIONAL:   [ ]  fever,  [ ]  chills   For VQI Use Only  PRE-ADM LIVING: Home  AMB STATUS: Ambulatory  CAD Sx: None  PRIOR CHF: None  STRESS TEST: [x]  No, [ ]  Normal, [ ]  + ischemia, [ ]  + MI, [ ]  Both   Physical Examination  Filed Vitals:   07/31/15 1006 07/31/15 1009 07/31/15 1010  BP: 181/82 185/86 180/87  Pulse: 85 85 85  Temp: 98.1 F (36.7 C)    Resp: 16    Height: 5\' 8"  (1.727 m)    Weight: 154 lb (69.854 kg)    SpO2: 98%     Body mass index is 23.42 kg/(m^2).  General: A&O x 3, WD, thin  Head: [place]/AT  Ear/Nose/Throat: Hearing grossly intact, nares w/o erythema or drainage, oropharynx w/ Erythema w/o Exudate, Mallampati score: 3  Eyes: PERRLA, EOMI  Neck: Supple, no nuchal rigidity, + palpable LAD  Pulmonary: Sym exp, good air movt, CTAB, no rales, rhonchi, & wheezing  Cardiac: RRR, Nl S1, S2, no Murmurs, rubs or gallops  Vascular: Vessel Right Left  Radial Palpable Palpable  Brachial Palpable Palpable  Carotid Palpable, without bruit Palpable, without bruit  Aorta  Not palpable N/A   Femoral Palpable Palpable  Popliteal  Not palpable Not palpable  PT Faintly Palpable Faintly Palpable  DP Palpable Palpable   Gastrointestinal: soft, NTND, -G/R, - HSM, - masses, - CVAT B  Musculoskeletal: M/S 5/5 throughout , Extremities without ischemic changes   Neurologic: CN 2-12 intact , Pain and light touch intact in extremities , Motor exam as listed above  Psychiatric: Judgment intact, Mood & affect appropriate for pt's clinical situation  Dermatologic: See M/S exam for extremity exam, no rashes otherwise noted  Lymph : No Cervical, Axillary, or Inguinal lymphadenopathy    Non-Invasive Vascular Imaging  CAROTID DUPLEX (Date: 07/31/2015):   R ICA stenosis: 40-59% (visually <40%)  R VA: patent and antegrade  L ICA stenosis: 1-39%  L VA: patent and antegrade   Outside Studies/Documentation 15 pages of outside documents were reviewed including: outside PCP and outside duplex.   Medical Decision Making  Heidi Hobbs is a 75 y.o. female who presents with: asx BICA stenosis <80%.   Based on the patient's vascular studies and examination, I have offered the patient: repeat carotid duplex in 6 months.  If repeat study remains in the 40-59% range, can space out to annual duplex.  I discussed in depth with the patient the nature of atherosclerosis, and emphasized the importance of maximal medical management including strict control of blood pressure, blood glucose, and lipid levels, obtaining regular exercise, antiplatelet agents, and cessation of smoking.   The patient is currently on a statin: Lipitor.  The patient is currently on an anti-platelet: ASA.  The patient is aware that without maximal medical management the underlying atherosclerotic disease process will progress, limiting the benefit of any interventions.  Thank you for allowing [place] to participate in this patient's care.   Adele Barthel, MD [facility] Office:  [phone] Pager: [phone]  07/31/2015, 12:07 PM

## 2015-08-05 DIAGNOSIS — L508 Other urticaria: Secondary | ICD-10-CM | POA: Diagnosis not present

## 2015-08-05 DIAGNOSIS — M318 Other specified necrotizing vasculopathies: Secondary | ICD-10-CM | POA: Diagnosis not present

## 2015-08-05 DIAGNOSIS — Z79899 Other long term (current) drug therapy: Secondary | ICD-10-CM | POA: Diagnosis not present

## 2015-10-14 ENCOUNTER — Other Ambulatory Visit: Payer: Self-pay | Admitting: Vascular Surgery

## 2015-10-14 DIAGNOSIS — I779 Disorder of arteries and arterioles, unspecified: Secondary | ICD-10-CM

## 2015-10-14 DIAGNOSIS — I739 Peripheral vascular disease, unspecified: Principal | ICD-10-CM

## 2015-11-17 DIAGNOSIS — E785 Hyperlipidemia, unspecified: Secondary | ICD-10-CM | POA: Diagnosis not present

## 2015-11-17 DIAGNOSIS — F17210 Nicotine dependence, cigarettes, uncomplicated: Secondary | ICD-10-CM | POA: Diagnosis not present

## 2015-11-17 DIAGNOSIS — R0789 Other chest pain: Secondary | ICD-10-CM | POA: Diagnosis not present

## 2015-11-17 DIAGNOSIS — J40 Bronchitis, not specified as acute or chronic: Secondary | ICD-10-CM | POA: Diagnosis not present

## 2015-11-17 DIAGNOSIS — R0981 Nasal congestion: Secondary | ICD-10-CM | POA: Diagnosis not present

## 2015-11-17 DIAGNOSIS — I10 Essential (primary) hypertension: Secondary | ICD-10-CM | POA: Diagnosis not present

## 2015-11-17 DIAGNOSIS — J984 Other disorders of lung: Secondary | ICD-10-CM | POA: Diagnosis not present

## 2015-11-17 DIAGNOSIS — J441 Chronic obstructive pulmonary disease with (acute) exacerbation: Secondary | ICD-10-CM | POA: Diagnosis not present

## 2015-11-17 DIAGNOSIS — Z79899 Other long term (current) drug therapy: Secondary | ICD-10-CM | POA: Diagnosis not present

## 2015-11-17 DIAGNOSIS — I2510 Atherosclerotic heart disease of native coronary artery without angina pectoris: Secondary | ICD-10-CM | POA: Diagnosis not present

## 2015-11-17 DIAGNOSIS — R0602 Shortness of breath: Secondary | ICD-10-CM | POA: Diagnosis not present

## 2015-11-17 DIAGNOSIS — R05 Cough: Secondary | ICD-10-CM | POA: Diagnosis not present

## 2015-11-17 DIAGNOSIS — J8410 Pulmonary fibrosis, unspecified: Secondary | ICD-10-CM | POA: Diagnosis not present

## 2016-01-05 DIAGNOSIS — Z5181 Encounter for therapeutic drug level monitoring: Secondary | ICD-10-CM | POA: Diagnosis not present

## 2016-01-05 DIAGNOSIS — L958 Other vasculitis limited to the skin: Secondary | ICD-10-CM | POA: Diagnosis not present

## 2016-01-05 DIAGNOSIS — R197 Diarrhea, unspecified: Secondary | ICD-10-CM | POA: Diagnosis not present

## 2016-01-05 DIAGNOSIS — M318 Other specified necrotizing vasculopathies: Secondary | ICD-10-CM | POA: Diagnosis not present

## 2016-01-05 DIAGNOSIS — L509 Urticaria, unspecified: Secondary | ICD-10-CM | POA: Diagnosis not present

## 2016-01-06 DIAGNOSIS — I10 Essential (primary) hypertension: Secondary | ICD-10-CM | POA: Diagnosis not present

## 2016-01-06 DIAGNOSIS — J449 Chronic obstructive pulmonary disease, unspecified: Secondary | ICD-10-CM | POA: Diagnosis not present

## 2016-01-06 DIAGNOSIS — Z6824 Body mass index (BMI) 24.0-24.9, adult: Secondary | ICD-10-CM | POA: Diagnosis not present

## 2016-01-06 DIAGNOSIS — R3915 Urgency of urination: Secondary | ICD-10-CM | POA: Diagnosis not present

## 2016-01-06 DIAGNOSIS — J479 Bronchiectasis, uncomplicated: Secondary | ICD-10-CM | POA: Diagnosis not present

## 2016-01-06 DIAGNOSIS — G14 Postpolio syndrome: Secondary | ICD-10-CM | POA: Diagnosis not present

## 2016-01-06 DIAGNOSIS — R8299 Other abnormal findings in urine: Secondary | ICD-10-CM | POA: Diagnosis not present

## 2016-01-06 DIAGNOSIS — N390 Urinary tract infection, site not specified: Secondary | ICD-10-CM | POA: Diagnosis not present

## 2016-01-06 DIAGNOSIS — M310 Hypersensitivity angiitis: Secondary | ICD-10-CM | POA: Diagnosis not present

## 2016-01-06 DIAGNOSIS — R7309 Other abnormal glucose: Secondary | ICD-10-CM | POA: Diagnosis not present

## 2016-01-06 DIAGNOSIS — Z23 Encounter for immunization: Secondary | ICD-10-CM | POA: Diagnosis not present

## 2016-01-23 ENCOUNTER — Encounter (HOSPITAL_COMMUNITY): Payer: Self-pay | Admitting: *Deleted

## 2016-01-23 ENCOUNTER — Inpatient Hospital Stay (HOSPITAL_COMMUNITY): Payer: Medicare Other | Admitting: Anesthesiology

## 2016-01-23 ENCOUNTER — Inpatient Hospital Stay (HOSPITAL_COMMUNITY)
Admission: EM | Admit: 2016-01-23 | Discharge: 2016-02-01 | DRG: 339 | Disposition: A | Discharge status: Home / Self Care | Payer: Medicare Other | Attending: Surgery | Admitting: Surgery

## 2016-01-23 ENCOUNTER — Emergency Department (HOSPITAL_COMMUNITY): Payer: Medicare Other

## 2016-01-23 ENCOUNTER — Encounter (HOSPITAL_COMMUNITY): Admission: EM | Disposition: A | Discharge status: Home / Self Care | Payer: Self-pay | Source: Home / Self Care

## 2016-01-23 DIAGNOSIS — Z7982 Long term (current) use of aspirin: Secondary | ICD-10-CM | POA: Diagnosis not present

## 2016-01-23 DIAGNOSIS — K3580 Unspecified acute appendicitis: Secondary | ICD-10-CM | POA: Diagnosis not present

## 2016-01-23 DIAGNOSIS — H353190 Nonexudative age-related macular degeneration, unspecified eye, stage unspecified: Secondary | ICD-10-CM | POA: Diagnosis present

## 2016-01-23 DIAGNOSIS — Y92239 Unspecified place in hospital as the place of occurrence of the external cause: Secondary | ICD-10-CM | POA: Diagnosis not present

## 2016-01-23 DIAGNOSIS — R309 Painful micturition, unspecified: Secondary | ICD-10-CM | POA: Diagnosis present

## 2016-01-23 DIAGNOSIS — Z79899 Other long term (current) drug therapy: Secondary | ICD-10-CM | POA: Diagnosis not present

## 2016-01-23 DIAGNOSIS — J449 Chronic obstructive pulmonary disease, unspecified: Secondary | ICD-10-CM | POA: Diagnosis not present

## 2016-01-23 DIAGNOSIS — K352 Acute appendicitis with generalized peritonitis, without abscess: Secondary | ICD-10-CM

## 2016-01-23 DIAGNOSIS — Z91012 Allergy to eggs: Secondary | ICD-10-CM | POA: Diagnosis not present

## 2016-01-23 DIAGNOSIS — K353 Acute appendicitis with localized peritonitis: Secondary | ICD-10-CM | POA: Diagnosis not present

## 2016-01-23 DIAGNOSIS — Y838 Other surgical procedures as the cause of abnormal reaction of the patient, or of later complication, without mention of misadventure at the time of the procedure: Secondary | ICD-10-CM | POA: Diagnosis not present

## 2016-01-23 DIAGNOSIS — I6529 Occlusion and stenosis of unspecified carotid artery: Secondary | ICD-10-CM | POA: Diagnosis not present

## 2016-01-23 DIAGNOSIS — R05 Cough: Secondary | ICD-10-CM | POA: Diagnosis not present

## 2016-01-23 DIAGNOSIS — I10 Essential (primary) hypertension: Secondary | ICD-10-CM | POA: Diagnosis present

## 2016-01-23 DIAGNOSIS — I739 Peripheral vascular disease, unspecified: Secondary | ICD-10-CM | POA: Diagnosis not present

## 2016-01-23 DIAGNOSIS — Z8711 Personal history of peptic ulcer disease: Secondary | ICD-10-CM | POA: Diagnosis not present

## 2016-01-23 DIAGNOSIS — J40 Bronchitis, not specified as acute or chronic: Secondary | ICD-10-CM | POA: Diagnosis not present

## 2016-01-23 DIAGNOSIS — E785 Hyperlipidemia, unspecified: Secondary | ICD-10-CM | POA: Diagnosis present

## 2016-01-23 DIAGNOSIS — H3530 Unspecified macular degeneration: Secondary | ICD-10-CM | POA: Diagnosis present

## 2016-01-23 DIAGNOSIS — K913 Postprocedural intestinal obstruction, unspecified as to partial versus complete: Secondary | ICD-10-CM | POA: Diagnosis not present

## 2016-01-23 DIAGNOSIS — E876 Hypokalemia: Secondary | ICD-10-CM | POA: Diagnosis present

## 2016-01-23 DIAGNOSIS — F17210 Nicotine dependence, cigarettes, uncomplicated: Secondary | ICD-10-CM | POA: Diagnosis not present

## 2016-01-23 DIAGNOSIS — R109 Unspecified abdominal pain: Secondary | ICD-10-CM | POA: Diagnosis not present

## 2016-01-23 DIAGNOSIS — Z8542 Personal history of malignant neoplasm of other parts of uterus: Secondary | ICD-10-CM | POA: Diagnosis not present

## 2016-01-23 DIAGNOSIS — R0989 Other specified symptoms and signs involving the circulatory and respiratory systems: Secondary | ICD-10-CM

## 2016-01-23 DIAGNOSIS — R269 Unspecified abnormalities of gait and mobility: Secondary | ICD-10-CM | POA: Diagnosis not present

## 2016-01-23 DIAGNOSIS — I776 Arteritis, unspecified: Secondary | ICD-10-CM | POA: Diagnosis present

## 2016-01-23 DIAGNOSIS — F419 Anxiety disorder, unspecified: Secondary | ICD-10-CM | POA: Diagnosis not present

## 2016-01-23 DIAGNOSIS — I779 Disorder of arteries and arterioles, unspecified: Secondary | ICD-10-CM | POA: Diagnosis present

## 2016-01-23 DIAGNOSIS — K3533 Acute appendicitis with perforation and localized peritonitis, with abscess: Secondary | ICD-10-CM | POA: Diagnosis present

## 2016-01-23 HISTORY — DX: Essential (primary) hypertension: I10

## 2016-01-23 HISTORY — DX: Nonexudative age-related macular degeneration, unspecified eye, stage unspecified: H35.3190

## 2016-01-23 HISTORY — PX: LAPAROSCOPIC APPENDECTOMY: SHX408

## 2016-01-23 LAB — COMPREHENSIVE METABOLIC PANEL
ALBUMIN: 3.5 g/dL (ref 3.5–5.0)
ALT: 14 U/L (ref 14–54)
AST: 21 U/L (ref 15–41)
Alkaline Phosphatase: 71 U/L (ref 38–126)
Anion gap: 12 (ref 5–15)
BUN: 7 mg/dL (ref 6–20)
CHLORIDE: 100 mmol/L — AB (ref 101–111)
CO2: 21 mmol/L — ABNORMAL LOW (ref 22–32)
CREATININE: 0.63 mg/dL (ref 0.44–1.00)
Calcium: 8.8 mg/dL — ABNORMAL LOW (ref 8.9–10.3)
GFR calc Af Amer: >60 mL/min (ref >60)
GFR calc non Af Amer: >60 mL/min (ref >60)
Glucose, Bld: 133 mg/dL — ABNORMAL HIGH (ref 65–99)
POTASSIUM: 3.5 mmol/L (ref 3.5–5.1)
Sodium: 133 mmol/L — ABNORMAL LOW (ref 135–145)
Total Bilirubin: 0.4 mg/dL (ref 0.3–1.2)
Total Protein: 7.1 g/dL (ref 6.5–8.1)

## 2016-01-23 LAB — CBC
HCT: 39.6 % (ref 36.0–46.0)
Hemoglobin: 13.3 g/dL (ref 12.0–15.0)
MCH: 29.6 pg (ref 26.0–34.0)
MCHC: 33.6 g/dL (ref 30.0–36.0)
MCV: 88 fL (ref 78.0–100.0)
Platelets: 347 10*3/uL (ref 150–400)
RBC: 4.5 MIL/uL (ref 3.87–5.11)
RDW: 14.3 % (ref 11.5–15.5)
WBC: 18.7 10*3/uL — ABNORMAL HIGH (ref 4.0–10.5)

## 2016-01-23 LAB — URINALYSIS, ROUTINE W REFLEX MICROSCOPIC
BILIRUBIN URINE: NEGATIVE
GLUCOSE, UA: NEGATIVE mg/dL
KETONES UR: NEGATIVE mg/dL
Leukocytes, UA: NEGATIVE
Nitrite: NEGATIVE
PH: 7.5 (ref 5.0–8.0)
Protein, ur: NEGATIVE mg/dL
Specific Gravity, Urine: 1.017 (ref 1.005–1.030)

## 2016-01-23 LAB — PROTIME-INR
INR: 1.11
Prothrombin Time: 14.4 seconds (ref 11.4–15.2)

## 2016-01-23 LAB — URINE MICROSCOPIC-ADD ON

## 2016-01-23 LAB — LIPASE, BLOOD: LIPASE: 20 U/L (ref 11–51)

## 2016-01-23 LAB — I-STAT CG4 LACTIC ACID, ED
Lactic Acid, Venous: 1.4 mmol/L (ref 0.5–1.9)
Lactic Acid, Venous: 0.79 mmol/L (ref 0.5–1.9)

## 2016-01-23 LAB — APTT: aPTT: 32 seconds (ref 24–36)

## 2016-01-23 SURGERY — APPENDECTOMY, LAPAROSCOPIC
Anesthesia: General | Site: Abdomen

## 2016-01-23 MED ORDER — POTASSIUM CHLORIDE IN NACL 20-0.9 MEQ/L-% IV SOLN
INTRAVENOUS | Status: DC
  Administered 2016-01-23 – 2016-01-25 (×6): via INTRAVENOUS
  Filled 2016-01-23 (×8): qty 1000

## 2016-01-23 MED ORDER — HYDROMORPHONE HCL 1 MG/ML IJ SOLN: 0.5000 mg | INTRAMUSCULAR | Status: DC | PRN

## 2016-01-23 MED ORDER — MORPHINE SULFATE (PF) 4 MG/ML IV SOLN
4.0000 mg | Freq: Once | INTRAVENOUS | Status: AC
  Administered 2016-01-23: 4 mg via INTRAVENOUS
  Filled 2016-01-23: qty 1

## 2016-01-23 MED ORDER — ALBUTEROL SULFATE (2.5 MG/3ML) 0.083% IN NEBU
2.5000 mg | INHALATION_SOLUTION | Freq: Once | RESPIRATORY_TRACT | Status: AC
  Administered 2016-01-23: 2.5 mg via RESPIRATORY_TRACT

## 2016-01-23 MED ORDER — ALBUTEROL SULFATE (2.5 MG/3ML) 0.083% IN NEBU
INHALATION_SOLUTION | RESPIRATORY_TRACT | Status: AC
  Administered 2016-01-23: 2.5 mg via RESPIRATORY_TRACT
  Filled 2016-01-23: qty 3

## 2016-01-23 MED ORDER — FENTANYL CITRATE (PF) 100 MCG/2ML IJ SOLN
INTRAMUSCULAR | Status: AC
  Filled 2016-01-23: qty 4

## 2016-01-23 MED ORDER — LIDOCAINE HCL (CARDIAC) 20 MG/ML IV SOLN
INTRAVENOUS | Status: DC | PRN
  Administered 2016-01-23: 60 mg via INTRAVENOUS

## 2016-01-23 MED ORDER — DEXAMETHASONE SODIUM PHOSPHATE 10 MG/ML IJ SOLN
INTRAMUSCULAR | Status: DC | PRN
  Administered 2016-01-23: 5 mg via INTRAVENOUS

## 2016-01-23 MED ORDER — DEXAMETHASONE SODIUM PHOSPHATE 10 MG/ML IJ SOLN
INTRAMUSCULAR | Status: AC
  Filled 2016-01-23: qty 2

## 2016-01-23 MED ORDER — PIPERACILLIN-TAZOBACTAM 3.375 G IVPB 30 MIN
3.3750 g | Freq: Once | INTRAVENOUS | Status: AC
  Administered 2016-01-23: 3.375 g via INTRAVENOUS
  Filled 2016-01-23: qty 50

## 2016-01-23 MED ORDER — SODIUM CHLORIDE 0.9 % IR SOLN
Status: DC | PRN
  Administered 2016-01-23: 1000 mL

## 2016-01-23 MED ORDER — HYDROMORPHONE HCL 2 MG/ML IJ SOLN
INTRAMUSCULAR | Status: AC
  Administered 2016-01-23: 0.5 mg via INTRAVENOUS
  Filled 2016-01-23: qty 1

## 2016-01-23 MED ORDER — PIPERACILLIN-TAZOBACTAM 3.375 G IVPB
3.3750 g | Freq: Three times a day (TID) | INTRAVENOUS | Status: DC
  Administered 2016-01-24 – 2016-01-31 (×24): 3.375 g via INTRAVENOUS
  Filled 2016-01-23 (×28): qty 50

## 2016-01-23 MED ORDER — PROPOFOL 10 MG/ML IV BOLUS
INTRAVENOUS | Status: AC
  Filled 2016-01-23: qty 20

## 2016-01-23 MED ORDER — DIPHENHYDRAMINE HCL 50 MG/ML IJ SOLN: 12.5000 mg | Freq: Four times a day (QID) | INTRAMUSCULAR | Status: DC | PRN

## 2016-01-23 MED ORDER — OXYCODONE HCL 5 MG/5ML PO SOLN: 5.0000 mg | Freq: Once | ORAL | Status: DC | PRN

## 2016-01-23 MED ORDER — HYDROMORPHONE HCL 2 MG/ML IJ SOLN
0.5000 mg | INTRAMUSCULAR | Status: DC | PRN
  Administered 2016-01-23: 1 mg via INTRAVENOUS
  Administered 2016-01-23: 0.5 mg via INTRAVENOUS
  Filled 2016-01-23: qty 1

## 2016-01-23 MED ORDER — DIPHENHYDRAMINE HCL 12.5 MG/5ML PO ELIX: 12.5000 mg | ORAL_SOLUTION | Freq: Four times a day (QID) | ORAL | Status: DC | PRN

## 2016-01-23 MED ORDER — ONDANSETRON HCL 4 MG/2ML IJ SOLN
4.0000 mg | Freq: Four times a day (QID) | INTRAMUSCULAR | Status: DC | PRN
  Administered 2016-01-23: 4 mg via INTRAVENOUS
  Filled 2016-01-23: qty 2

## 2016-01-23 MED ORDER — HYDROMORPHONE HCL 1 MG/ML IJ SOLN: 0.2500 mg | INTRAMUSCULAR | Status: DC | PRN

## 2016-01-23 MED ORDER — PROPOFOL 10 MG/ML IV BOLUS
INTRAVENOUS | Status: DC | PRN
  Administered 2016-01-23: 10 mg via INTRAVENOUS

## 2016-01-23 MED ORDER — ONDANSETRON HCL 4 MG/2ML IJ SOLN
INTRAMUSCULAR | Status: AC
  Filled 2016-01-23: qty 2

## 2016-01-23 MED ORDER — ONDANSETRON HCL 4 MG/2ML IJ SOLN: 4.0000 mg | Freq: Four times a day (QID) | INTRAMUSCULAR | Status: DC | PRN

## 2016-01-23 MED ORDER — OXYCODONE HCL 5 MG PO TABS: 5.0000 mg | ORAL_TABLET | Freq: Once | ORAL | Status: DC | PRN

## 2016-01-23 MED ORDER — MOMETASONE FURO-FORMOTEROL FUM 200-5 MCG/ACT IN AERO
2.0000 | INHALATION_SPRAY | Freq: Two times a day (BID) | RESPIRATORY_TRACT | Status: DC
  Administered 2016-01-24 – 2016-01-31 (×14): 2 via RESPIRATORY_TRACT
  Filled 2016-01-23: qty 8.8

## 2016-01-23 MED ORDER — SUGAMMADEX SODIUM 200 MG/2ML IV SOLN
INTRAVENOUS | Status: AC
  Filled 2016-01-23: qty 2

## 2016-01-23 MED ORDER — SUGAMMADEX SODIUM 200 MG/2ML IV SOLN
INTRAVENOUS | Status: DC | PRN
  Administered 2016-01-23: 150 mg via INTRAVENOUS

## 2016-01-23 MED ORDER — BUPIVACAINE HCL (PF) 0.25 % IJ SOLN
INTRAMUSCULAR | Status: DC | PRN
  Administered 2016-01-23: 10 mL

## 2016-01-23 MED ORDER — FENTANYL CITRATE (PF) 100 MCG/2ML IJ SOLN
INTRAMUSCULAR | Status: DC | PRN
Start: 2016-01-23 — End: 2016-01-23
  Administered 2016-01-23: 50 ug via INTRAVENOUS
  Administered 2016-01-23: 100 ug via INTRAVENOUS
  Administered 2016-01-23: 50 ug via INTRAVENOUS

## 2016-01-23 MED ORDER — ONDANSETRON 4 MG PO TBDP
4.0000 mg | ORAL_TABLET | Freq: Four times a day (QID) | ORAL | Status: DC | PRN
Start: 2016-01-23 — End: 2016-02-01
  Administered 2016-01-30: 4 mg via ORAL
  Filled 2016-01-23: qty 1

## 2016-01-23 MED ORDER — FENTANYL CITRATE (PF) 100 MCG/2ML IJ SOLN
INTRAMUSCULAR | Status: AC
  Filled 2016-01-23: qty 2

## 2016-01-23 MED ORDER — ACETAMINOPHEN 325 MG PO TABS: 650.0000 mg | ORAL_TABLET | Freq: Four times a day (QID) | ORAL | Status: DC | PRN

## 2016-01-23 MED ORDER — SENNA 8.6 MG PO TABS
1.0000 | ORAL_TABLET | Freq: Two times a day (BID) | ORAL | Status: DC
  Administered 2016-01-24 – 2016-01-27 (×7): 8.6 mg via ORAL
  Filled 2016-01-23 (×7): qty 1

## 2016-01-23 MED ORDER — BISACODYL 10 MG RE SUPP
10.0000 mg | Freq: Every day | RECTAL | Status: DC | PRN
  Filled 2016-01-23: qty 1

## 2016-01-23 MED ORDER — IOPAMIDOL (ISOVUE-370) INJECTION 76%
INTRAVENOUS | Status: AC
  Administered 2016-01-23: 100 mL
  Filled 2016-01-23: qty 100

## 2016-01-23 MED ORDER — ONDANSETRON HCL 4 MG/2ML IJ SOLN
4.0000 mg | Freq: Once | INTRAMUSCULAR | Status: AC
  Administered 2016-01-23: 4 mg via INTRAVENOUS
  Filled 2016-01-23: qty 2

## 2016-01-23 MED ORDER — ACETAMINOPHEN 650 MG RE SUPP
650.0000 mg | Freq: Once | RECTAL | Status: AC
  Administered 2016-01-23: 650 mg via RECTAL
  Filled 2016-01-23: qty 1

## 2016-01-23 MED ORDER — SODIUM CHLORIDE 0.9 % IV BOLUS (SEPSIS)
1000.0000 mL | Freq: Once | INTRAVENOUS | Status: AC
  Administered 2016-01-23: 1000 mL via INTRAVENOUS

## 2016-01-23 MED ORDER — BUPIVACAINE HCL (PF) 0.25 % IJ SOLN
INTRAMUSCULAR | Status: AC
  Filled 2016-01-23: qty 30

## 2016-01-23 MED ORDER — DOCUSATE SODIUM 100 MG PO CAPS
100.0000 mg | ORAL_CAPSULE | Freq: Two times a day (BID) | ORAL | Status: DC
  Administered 2016-01-24 – 2016-02-01 (×15): 100 mg via ORAL
  Filled 2016-01-23 (×16): qty 1

## 2016-01-23 MED ORDER — SUCCINYLCHOLINE CHLORIDE 200 MG/10ML IV SOSY
PREFILLED_SYRINGE | INTRAVENOUS | Status: AC
  Filled 2016-01-23: qty 10

## 2016-01-23 MED ORDER — 0.9 % SODIUM CHLORIDE (POUR BTL) OPTIME
TOPICAL | Status: DC | PRN
  Administered 2016-01-23: 1000 mL

## 2016-01-23 MED ORDER — SUCCINYLCHOLINE CHLORIDE 20 MG/ML IJ SOLN
INTRAMUSCULAR | Status: DC | PRN
  Administered 2016-01-23: 100 mg via INTRAVENOUS

## 2016-01-23 MED ORDER — NEOSTIGMINE METHYLSULFATE 5 MG/5ML IV SOSY
PREFILLED_SYRINGE | INTRAVENOUS | Status: AC
  Filled 2016-01-23: qty 5

## 2016-01-23 MED ORDER — HYDROMORPHONE HCL 1 MG/ML IJ SOLN
0.2000 mg | INTRAMUSCULAR | Status: DC | PRN
  Administered 2016-01-23 – 2016-01-24 (×4): 0.4 mg via INTRAVENOUS
  Filled 2016-01-23 (×4): qty 1

## 2016-01-23 MED ORDER — LACTATED RINGERS IV SOLN
INTRAVENOUS | Status: DC
  Administered 2016-01-23 (×2): via INTRAVENOUS

## 2016-01-23 MED ORDER — ROCURONIUM BROMIDE 100 MG/10ML IV SOLN
INTRAVENOUS | Status: DC | PRN
  Administered 2016-01-23: 10 mg via INTRAVENOUS
  Administered 2016-01-23: 30 mg via INTRAVENOUS

## 2016-01-23 MED ORDER — PHENYLEPHRINE 40 MCG/ML (10ML) SYRINGE FOR IV PUSH (FOR BLOOD PRESSURE SUPPORT)
PREFILLED_SYRINGE | INTRAVENOUS | Status: AC
  Filled 2016-01-23: qty 20

## 2016-01-23 MED ORDER — ONDANSETRON HCL 4 MG/2ML IJ SOLN
INTRAMUSCULAR | Status: DC | PRN
  Administered 2016-01-23: 4 mg via INTRAVENOUS

## 2016-01-23 MED ORDER — HYDROMORPHONE HCL 2 MG/ML IJ SOLN
0.5000 mg | Freq: Once | INTRAMUSCULAR | Status: AC
  Administered 2016-01-23: 0.5 mg via INTRAVENOUS
  Filled 2016-01-23: qty 1

## 2016-01-23 MED ORDER — PHENYLEPHRINE HCL 10 MG/ML IJ SOLN
INTRAMUSCULAR | Status: DC | PRN
  Administered 2016-01-23: 80 ug via INTRAVENOUS

## 2016-01-23 MED ORDER — TRAMADOL HCL 50 MG PO TABS
50.0000 mg | ORAL_TABLET | Freq: Four times a day (QID) | ORAL | Status: DC | PRN
  Administered 2016-01-24 – 2016-01-27 (×6): 50 mg via ORAL
  Filled 2016-01-23 (×7): qty 1

## 2016-01-23 SURGICAL SUPPLY — 53 items
APPLIER CLIP ROT 10 11.4 M/L (STAPLE) ×2
BENZOIN TINCTURE PRP APPL 2/3 (GAUZE/BANDAGES/DRESSINGS) ×2 IMPLANT
BNDG COHESIVE 4X5 WHT NS (GAUZE/BANDAGES/DRESSINGS) ×2 IMPLANT
CANISTER SUCTION 2500CC (MISCELLANEOUS) ×2 IMPLANT
CHLORAPREP W/TINT 26ML (MISCELLANEOUS) ×2 IMPLANT
CLIP APPLIE ROT 10 11.4 M/L (STAPLE) ×1 IMPLANT
CLOSURE STERI-STRIP 1/4X4 (GAUZE/BANDAGES/DRESSINGS) ×2 IMPLANT
COVER SURGICAL LIGHT HANDLE (MISCELLANEOUS) ×2 IMPLANT
CUTTER FLEX LINEAR 45M (STAPLE) ×2 IMPLANT
DERMABOND ADVANCED (GAUZE/BANDAGES/DRESSINGS) ×1
DERMABOND ADVANCED .7 DNX12 (GAUZE/BANDAGES/DRESSINGS) ×1 IMPLANT
DEVICE PMI PUNCTURE CLOSURE (MISCELLANEOUS) ×2 IMPLANT
DRAIN CHANNEL 19F RND (DRAIN) ×2 IMPLANT
ELECT REM PT RETURN 9FT ADLT (ELECTROSURGICAL) ×2
ELECTRODE REM PT RTRN 9FT ADLT (ELECTROSURGICAL) ×1 IMPLANT
ENDOLOOP SUT PDS II  0 18 (SUTURE)
ENDOLOOP SUT PDS II 0 18 (SUTURE) IMPLANT
EVACUATOR SILICONE 100CC (DRAIN) ×2 IMPLANT
GAUZE SPONGE 4X4 16PLY XRAY LF (GAUZE/BANDAGES/DRESSINGS) ×2 IMPLANT
GLOVE BIO SURGEON STRL SZ 6 (GLOVE) ×2 IMPLANT
GLOVE BIO SURGEON STRL SZ 6.5 (GLOVE) ×4 IMPLANT
GLOVE BIO SURGEON STRL SZ7 (GLOVE) ×4 IMPLANT
GLOVE BIOGEL PI IND STRL 6.5 (GLOVE) ×2 IMPLANT
GLOVE BIOGEL PI INDICATOR 6.5 (GLOVE) ×2
GLOVE INDICATOR 7.0 STRL GRN (GLOVE) ×4 IMPLANT
GOWN STRL REUS W/ TWL LRG LVL3 (GOWN DISPOSABLE) ×3 IMPLANT
GOWN STRL REUS W/TWL LRG LVL3 (GOWN DISPOSABLE) ×3
KIT BASIN OR (CUSTOM PROCEDURE TRAY) ×2 IMPLANT
KIT ROOM TURNOVER OR (KITS) ×2 IMPLANT
NS IRRIG 1000ML POUR BTL (IV SOLUTION) ×2 IMPLANT
PAD ARMBOARD 7.5X6 YLW CONV (MISCELLANEOUS) ×4 IMPLANT
POUCH SPECIMEN RETRIEVAL 10MM (ENDOMECHANICALS) ×2 IMPLANT
RELOAD STAPLE TA45 3.5 REG BLU (ENDOMECHANICALS) ×2 IMPLANT
SCALPEL HARMONIC ACE (MISCELLANEOUS) IMPLANT
SCISSORS LAP 5X35 DISP (ENDOMECHANICALS) ×2 IMPLANT
SET IRRIG TUBING LAPAROSCOPIC (IRRIGATION / IRRIGATOR) ×2 IMPLANT
SHEARS HARMONIC ACE PLUS 36CM (ENDOMECHANICALS) ×2 IMPLANT
SLEEVE ENDOPATH XCEL 5M (ENDOMECHANICALS) ×4 IMPLANT
SPECIMEN JAR SMALL (MISCELLANEOUS) ×2 IMPLANT
SPONGE DRAIN TRACH 4X4 STRL 2S (GAUZE/BANDAGES/DRESSINGS) ×2 IMPLANT
STRIP CLOSURE SKIN 1/2X4 (GAUZE/BANDAGES/DRESSINGS) ×2 IMPLANT
SUT ETHILON 2 0 FS 18 (SUTURE) ×2 IMPLANT
SUT MON AB 4-0 PC3 18 (SUTURE) ×2 IMPLANT
TOWEL OR 17X24 6PK STRL BLUE (TOWEL DISPOSABLE) ×2 IMPLANT
TOWEL OR 17X26 10 PK STRL BLUE (TOWEL DISPOSABLE) ×2 IMPLANT
TRAY FOLEY CATH 16FR SILVER (SET/KITS/TRAYS/PACK) ×2 IMPLANT
TRAY LAPAROSCOPIC MC (CUSTOM PROCEDURE TRAY) ×2 IMPLANT
TROCAR BLADELESS 12MM (ENDOMECHANICALS) ×2 IMPLANT
TROCAR XCEL BLADELESS 5X75MML (TROCAR) IMPLANT
TROCAR XCEL BLUNT TIP 100MML (ENDOMECHANICALS) IMPLANT
TROCAR XCEL NON-BLD 11X100MML (ENDOMECHANICALS) ×2 IMPLANT
TROCAR XCEL NON-BLD 5MMX100MML (ENDOMECHANICALS) ×2 IMPLANT
TUBING INSUFFLATION (TUBING) ×2 IMPLANT

## 2016-01-23 NOTE — Anesthesia Preprocedure Evaluation (Addendum)
Anesthesia Evaluation  Patient identified by MRN, date of birth, ID band Patient awake    Reviewed: Allergy & Precautions, NPO status , Patient's Chart, lab work & pertinent test results  Airway Mallampati: II  TM Distance: >3 FB Neck ROM: Full    Dental  (+) Teeth Intact, Dental Advisory Given   Pulmonary COPD, Current Smoker,  MAC   Pulmonary exam normal breath sounds clear to auscultation       Cardiovascular hypertension, + Peripheral Vascular Disease  Normal cardiovascular exam Rhythm:Regular Rate:Normal     Neuro/Psych PSYCHIATRIC DISORDERS Anxiety negative neurological ROS     GI/Hepatic Neg liver ROS, PUD,   Endo/Other  negative endocrine ROS  Renal/GU negative Renal ROS     Musculoskeletal negative musculoskeletal ROS (+)   Abdominal   Peds  Hematology negative hematology ROS (+)   Anesthesia Other Findings Day of surgery medications reviewed with the patient.  Reproductive/Obstetrics                             Anesthesia Physical Anesthesia Plan  ASA: II  Anesthesia Plan: General   Post-op Pain Management:    Induction: Intravenous  Airway Management Planned: Oral ETT  Additional Equipment:   Intra-op Plan:   Post-operative Plan: Extubation in OR  Informed Consent: I have reviewed the patients History and Physical, chart, labs and discussed the procedure including the risks, benefits and alternatives for the proposed anesthesia with the patient or authorized representative who has indicated his/her understanding and acceptance.   Dental advisory given  Plan Discussed with: CRNA, Anesthesiologist and Surgeon  Anesthesia Plan Comments: (Risks/benefits of general anesthesia discussed with patient including risk of damage to teeth, lips, gum, and tongue, nausea/vomiting, allergic reactions to medications, and the possibility of heart attack, stroke and death.  All  patient questions answered.  Patient wishes to proceed.)       Anesthesia Quick Evaluation

## 2016-01-23 NOTE — Progress Notes (Signed)
I have met with Mrs. Krolikowski and her family, and have reviewed her chart. We discussed laparoscopic appendectomy, possible conversion to open, possible drain placement. We discussed risks of surgery including bleeding, infection, pain, scarring, intraabdominal injury, and post-operative ileus, pneumonia, blood clot, heart attack, etc. She expressed understanding and desires to proceed with surgery.

## 2016-01-23 NOTE — Transfer of Care (Signed)
Immediate Anesthesia Transfer of Care Note  Patient: Heidi Hobbs  Procedure(s) Performed: Procedure(s): APPENDECTOMY LAPAROSCOPIC (N/A)  Patient Location: PACU  Anesthesia Type:General  Level of Consciousness: awake and patient cooperative  Airway & Oxygen Therapy: Patient Spontanous Breathing and Patient connected to nasal cannula oxygen  Post-op Assessment: Report given to RN and Post -op Vital signs reviewed and stable  Post vital signs: Reviewed and stable  Last Vitals:  Vitals:   01/23/16 1700 01/23/16 1715  BP: (!) 117/51 134/74  Pulse: 108 108  Resp: 26 21  Temp:      Last Pain:  Vitals:   01/23/16 1720  TempSrc:   PainSc: 5          Complications: No apparent anesthesia complications

## 2016-01-23 NOTE — ED Notes (Signed)
Patient transported to CT 

## 2016-01-23 NOTE — Op Note (Signed)
Operative Report  Heidi Hobbs 75 y.o. female  [id number]  [id number]  01/23/2016  Surgeon: Clovis Riley   Assistant: none  Procedure performed: Laparoscopic Appendectomy  Preop diagnosis: Acute appendicitis - with perforation   Post-op diagnosis/intraop findings: Acute appendicitis - with perforation  - with gangrene  Specimens: appendix  Retained items: 4fr round blake drain  EBL: minimal  Complications: none  Description of procedure: After obtaining informed consent the patient was brought to the operating room. Antibiotics and subcutaneous heparin were administered. SCD's were applied. General endotracheal anesthesia was initiated and a formal time-out was performed. The abdomen was prepped and draped in the usual sterile fashion and the abdomen was entered using an optiview in the left upper quadrant and insufflated to 15 mmHg. A 5 mm trocar and camera were then introduced, the abdomen was inspected and there is no evidence of injury from our entry. A suprapubic and periumbilical 5 mm trocar and a left lower quadrant 12 mm trocar were introduced under direct visualization following infiltration with local. The patient was then placed in Trendelenburg and rotated to the left and the small bowel was reflected cephalad. The cecum was freely mobile and was initially in the central to left abdomen. It was retracted to the patients right and cephalad to reveal the appendix.The appendix was visualized: it was adherent to the small bowel, sigmoid and retroperitoneum. These rind-like adhesions were carefully taken down to expose a gangrenous and perforated appendix. There was murky, dark brown fluid and a few small appendicoliths in the right lower quadrant and these were meticulously removed. The appendix was then carefully dissected from its mesentery using harmonic scalpel and hook electrocautery. It was noted to have formed an inflammatory rind to the ascending colon and  this was carefully dissected as well. Great care was taken to ensure no injury to surrounding retroperitoneal structures, cecum or terminal ileum. There was a small margin of viable appendix at the very base, and here a blue load linear cutting stapler was used to transect the appendix from the cecum.  Hemostasis was ensured. The appendix was placed in an Endo Catch bag and removed through our 12 mm trocar. The abdomen was once again inspected and irrigated with sterile saline. Hemostasis was confirmed and the staple line was noted to be viable and intact. A 79fr round blake drain was introduced through the left lower quadrant trocar site and directed to lie in the right lower quadrant and pelvis. The omentum was directed to lie over the cecum. The abdomen was desufflated and all trocars removed. The skin incisions were closed with running subcuticular monocryl and steri strips. The patient was awakened, extubated and transported to the recovery room in stable condition.   All counts were correct at the completion of the case.

## 2016-01-23 NOTE — ED Triage Notes (Signed)
Pt states generalized abdominal pain since last night after eating chicken last night.  Took gasx, tylenol with no relief.  Denies diarrhea, but states nausea and constipation.

## 2016-01-23 NOTE — Anesthesia Postprocedure Evaluation (Signed)
Anesthesia Post Note  Patient: Heidi Hobbs  Procedure(s) Performed: Procedure(s) (LRB): APPENDECTOMY LAPAROSCOPIC (N/A)  Patient location during evaluation: PACU Anesthesia Type: General Level of consciousness: awake and alert and patient cooperative Pain management: pain level controlled Vital Signs Assessment: post-procedure vital signs reviewed and stable Respiratory status: spontaneous breathing and respiratory function stable Cardiovascular status: stable Anesthetic complications: no    Last Vitals:  Vitals:   01/23/16 2155 01/23/16 2225  BP: (!) 116/52   Pulse: 97   Resp: (!) 8   Temp:  36.8 C    Last Pain:  Vitals:   01/23/16 2225  TempSrc:   PainSc: [place]

## 2016-01-23 NOTE — ED Provider Notes (Signed)
[facility] DEPT Provider Note   CSN: [id number] Arrival date & time: 01/23/16  1052     History   Chief Complaint Chief Complaint  Patient presents with  . Abdominal Pain    HPI Heidi Hobbs is a 75 y.o. female.  75 yo F with a chief complaint of abdominal pain. This is throughout the entire abdomen and severe. Started when she ate [place] fried chicken last night. Has been nauseated but not vomited. Denies diarrhea has had some constipation. Has not been able the eat anything today due to the pain. No history of prior surgery on her abdomen.   The history is provided by the patient.  Abdominal Pain   This is a new problem. The current episode started yesterday. The problem occurs constantly. The problem has not changed since onset.The pain is associated with eating. The pain is located in the generalized abdominal region. The quality of the pain is sharp and shooting. The pain is at a severity of 10/10. The pain is severe. Associated symptoms include nausea and constipation. Pertinent negatives include fever, diarrhea, vomiting, dysuria, headaches, arthralgias and myalgias. Nothing aggravates the symptoms. Nothing relieves the symptoms.    Past Medical History:  Diagnosis Date  . Adenomatous colon polyp 1995, 2011  . Anxiety   . Carotid stenosis    40-59% right, less than 40% left  . Cataracts, bilateral   . COPD (chronic obstructive pulmonary disease) ([place])   . Hyperlipidemia   . Hypertension   . MAC (mycobacterium avium-intracellulare complex)   . Polio 1959  . Stomach ulcer 1960  . 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 Acute appendicitis with perforation and peritoneal abscess 01/23/2016  . Carotid artery disease ([place]) 07/31/2015  . Bronchiectasis without acute exacerbation ([place]) 08/18/2012  . Cough 08/18/2012  . COPD GOLD I 08/17/2012    Past Surgical History:  Procedure  Laterality Date  . BREAST ENHANCEMENT SURGERY     removal  . CATARACT EXTRACTION    . COLONOSCOPY    . COLONOSCOPY  10/14/09  . LAPAROSCOPIC APPENDECTOMY N/A 01/23/2016   Procedure: APPENDECTOMY LAPAROSCOPIC;  Surgeon: Erroll Luna, MD;  Location: [facility];  Service: General;  Laterality: N/A;  . POLYPECTOMY  2011   x8  . TONSILLECTOMY AND ADENOIDECTOMY    . VAGINAL HYSTERECTOMY  1970   uterine cancer    OB History    No data available       Home Medications    Prior to Admission medications   Medication Sig Start Date End Date Taking? Authorizing Provider  albuterol (PROVENTIL HFA;VENTOLIN HFA) 108 (90 Base) MCG/ACT inhaler Inhale 1 puff into the lungs every 6 (six) hours as needed for wheezing or shortness of breath.   Yes Historical Provider, MD  amitriptyline (ELAVIL) 100 MG tablet Take 50 mg by mouth at bedtime.    Yes Historical Provider, MD  amLODipine (NORVASC) 5 MG tablet Take 5 mg by mouth daily.    Yes Historical Provider, MD  aspirin 81 MG EC tablet Take 81 mg by mouth daily.    Yes Historical Provider, MD  atenolol (TENORMIN) 50 MG tablet Take 50 mg by mouth daily.   Yes Historical Provider, MD  atorvastatin (LIPITOR) 40 MG tablet Take 20 mg by mouth daily.    Yes Historical Provider, MD  budesonide-formoterol (SYMBICORT) 160-4.5 MCG/ACT inhaler Inhale 2 puffs into the lungs 2 (two) times daily as  needed.   Yes Historical Provider, MD  Cholecalciferol (VITAMIN D3) 2000 UNITS TABS Take 1 tablet by mouth daily.   Yes Historical Provider, MD  colchicine 0.6 MG tablet Take 0.6 mg by mouth daily.    Yes Historical Provider, MD  Cyanocobalamin (VITAMIN B-12 PO) Take by mouth. 3000 iu daily   Yes Historical Provider, MD  EPINEPHrine 0.3 mg/0.3 mL IJ SOAJ injection Inject 0.3 mLs into the muscle once as needed. Allergic reaction   Yes Historical Provider, MD  ezetimibe (ZETIA) 10 MG tablet Take 10 mg by mouth daily.   Yes Historical Provider, MD  levocetirizine (XYZAL) 5 MG  tablet Take 5 mg by mouth daily. 01/03/16  Yes Historical Provider, MD  Multiple Vitamins-Minerals (PRESERVISION AREDS 2) CAPS Take 2 tablets by mouth 2 (two) times daily.   Yes Historical Provider, MD    Family History Family History  Problem Relation Age of Onset  . Colon cancer Father   . Esophageal cancer Brother   . Esophageal cancer Brother   . Asthma Daughter     as a child  . Stomach cancer Neg Hx   . Rectal cancer Neg Hx     Social History Social History  Substance Use Topics  . Smoking status: Light Tobacco Smoker    Packs/day: 0.50    Years: 50.00    Types: Cigarettes    Last attempt to quit: 03/16/2011  . Smokeless tobacco: Never Used     Comment: Rarely smokes now.    . Alcohol use No     Allergies   Eggs or egg-derived products   Review of Systems Review of Systems  Constitutional: Negative for chills and fever.  HENT: Negative for congestion and rhinorrhea.   Eyes: Negative for redness and visual disturbance.  Respiratory: Negative for shortness of breath and wheezing.   Cardiovascular: Negative for chest pain and palpitations.  Gastrointestinal: Positive for abdominal pain, constipation and nausea. Negative for diarrhea and vomiting.  Genitourinary: Negative for dysuria and urgency.  Musculoskeletal: Negative for arthralgias and myalgias.  Skin: Negative for pallor and wound.  Neurological: Negative for dizziness and headaches.     Physical Exam Updated Vital Signs BP (!) 125/52 (BP Location: Right Arm)   Pulse 87   Temp 97.6 F (36.4 C) (Oral)   Resp 18   Ht 5\' 8"  (1.727 m)   Wt 166 lb 14.2 oz (75.7 kg)   SpO2 96%   BMI 25.38 kg/m   Physical Exam  Constitutional: She is oriented to person, place, and time. She appears well-developed and well-nourished. No distress.  HENT:  Head: Normocephalic and atraumatic.  Eyes: EOM are normal. Pupils are equal, round, and reactive to light.  Neck: Normal range of motion. Neck supple.    Cardiovascular: Normal rate and regular rhythm.  Exam reveals no gallop and no friction rub.   No murmur heard. Pulmonary/Chest: Effort normal. She has no wheezes. She has no rales.  Abdominal: Soft. She exhibits no distension and no mass. There is tenderness (exquisitely tender pain at a proportion on palpation of any part of the abdomen). There is no guarding.  Musculoskeletal: She exhibits no edema or tenderness.  Neurological: She is alert and oriented to person, place, and time.  Skin: Skin is warm and dry. She is not diaphoretic.  Psychiatric: She has a normal mood and affect. Her behavior is normal.  Nursing note and vitals reviewed.    ED Treatments / Results  Labs (all labs ordered are listed,  but only abnormal results are displayed) Labs Reviewed  COMPREHENSIVE METABOLIC PANEL - Abnormal; Notable for the following:       Result Value   Sodium 133 (*)    Chloride 100 (*)    CO2 21 (*)    Glucose, Bld 133 (*)    Calcium 8.8 (*)    All other components within normal limits  CBC - Abnormal; Notable for the following:    WBC 18.7 (*)    All other components within normal limits  URINALYSIS, ROUTINE W REFLEX MICROSCOPIC (NOT AT [facility]) - Abnormal; Notable for the following:    Hgb urine dipstick SMALL (*)    All other components within normal limits  URINE MICROSCOPIC-ADD ON - Abnormal; Notable for the following:    Squamous Epithelial / LPF 0-5 (*)    Bacteria, UA MANY (*)    All other components within normal limits  BASIC METABOLIC PANEL - Abnormal; Notable for the following:    Potassium 3.4 (*)    Glucose, Bld 150 (*)    BUN <5 (*)    Calcium 8.2 (*)    All other components within normal limits  CBC - Abnormal; Notable for the following:    WBC 20.9 (*)    Hemoglobin 11.4 (*)    HCT 35.3 (*)    All other components within normal limits  CULTURE, BLOOD (ROUTINE X 2)  CULTURE, BLOOD (ROUTINE X 2)  MRSA PCR SCREENING  LIPASE, BLOOD  PROTIME-INR  APTT   MAGNESIUM  I-STAT CG4 LACTIC ACID, ED  I-STAT CG4 LACTIC ACID, ED  SURGICAL PATHOLOGY    EKG  EKG Interpretation None       Radiology Ct Angio Abd/pel W And/or Wo Contrast  Result Date: 01/23/2016 CLINICAL DATA:  75 year old female with a history of lower abdominal pain EXAM: CT ABDOMEN AND PELVIS WITH CONTRAST TECHNIQUE: Multidetector CT imaging of the abdomen and pelvis was performed using the standard protocol following bolus administration of intravenous contrast. CONTRAST:  100 cc Isovue 370 COMPARISON:  06/22/2011 FINDINGS: Lower chest: Architectural distortion of the bilateral lower lobes. No confluent airspace disease or pleural effusion. No pericardial fluid/ thickening. Moderate-sized hiatal hernia. Hepatobiliary: No focal liver abnormality is seen. Geographic hyperdensity of predominately left liver lobe, likely related to either focal fatty infiltration/sparing or flow phenomenon. No evidence of portal vein compromise or hepatic vein compromise. No gallstones, gallbladder wall thickening, or biliary dilatation. Pancreas: Unremarkable. No pancreatic ductal dilatation or surrounding inflammatory changes. Spleen: Normal in size without focal abnormality. Adrenals/Urinary Tract: Lobulated appearance of the bilateral adrenal glands. On the right, there is a nodule measuring approximately 10 mm. On the left there are several confluent nodules involving the main body as well as the lateral and medial limbs. Over time this has not changed in appearance. Stomach/Bowel: Thickened appendix with inspissated secretions at the appendix base. Inflammatory changes along the fat of the meso appendix. Flocculent changes at the tip of the appendix, which may or may not be within the lumen. Trace amount of fluid within the adjacent peritoneal leaves. Note that the cecum is on the left aspect of the abdomen. Colonic diverticular disease without evidence of diverticulitis. Unremarkable appearance of small  bowel with no transition. Moderate-sized hiatal hernia.  Unremarkable stomach. Vascular/Lymphatic: Calcifications of the abdominal aorta. No aneurysm. No dissection. Calcifications the bilateral iliac vasculature. Calcifications bilateral renal artery origin. Reproductive: Unremarkable appearance of the pelvic vessels. Other: No abdominal wall hernia or abnormality. No abdominopelvic ascites. Musculoskeletal: No displaced  fracture. Mild degenerative changes of the spine. IMPRESSION: Acute appendicitis. Note that the cecum is within the left abdomen, with the appendix tracking from left to right over the midline. Inflammatory changes in the adjacent fat, with questionable flocculent changes either within the appendiceal tip or representing micro perforation. Aortic atherosclerosis, with changes at the origin of the mesenteric vessels, however, with no evidence of vascular compromise. Diverticular disease without evidence of acute diverticulitis. These results were called by telephone at the time of interpretation on 01/23/2016 at 1:39 pm to Dr. Deno Etienne , who verbally acknowledged these results. Signed, Dulcy Fanny. Earleen Newport, DO [facility] Radiology Electronically Signed   By: Corrie Mckusick D.O.   On: 01/23/2016 13:41    Procedures Procedures (including critical care time)  Medications Ordered in ED Medications  piperacillin-tazobactam (ZOSYN) IVPB 3.375 g (3.375 g Intravenous New Bag/Given 01/24/16 1417)  diphenhydrAMINE (BENADRYL) 12.5 MG/5ML elixir 12.5 mg ( Oral MAR Unhold 01/23/16 2255)    Or  diphenhydrAMINE (BENADRYL) injection 12.5 mg ( Intravenous MAR Unhold 01/23/16 2255)  ondansetron (ZOFRAN-ODT) disintegrating tablet 4 mg ( Oral MAR Unhold 01/23/16 2255)    Or  ondansetron (ZOFRAN) injection 4 mg ( Intravenous MAR Unhold 01/23/16 2255)  0.9 % NaCl with KCl 20 mEq/ L  infusion ( Intravenous New Bag/Given 01/24/16 1619)  mometasone-formoterol  (DULERA) 200-5 MCG/ACT inhaler 2 puff (2 puffs Inhalation Given 01/24/16 0750)  traMADol (ULTRAM) tablet 50 mg (50 mg Oral Given 01/24/16 1327)  docusate sodium (COLACE) capsule 100 mg (100 mg Oral Given 01/24/16 0953)  senna (SENOKOT) tablet 8.6 mg (8.6 mg Oral Given 01/24/16 0953)  bisacodyl (DULCOLAX) suppository 10 mg (not administered)  HYDROmorphone (DILAUDID) injection 0.5-1 mg (1 mg Intravenous Given 01/24/16 1619)  acetaminophen (TYLENOL) tablet 1,000 mg (1,000 mg Oral Given 01/24/16 1327)  albuterol (PROVENTIL) (2.5 MG/3ML) 0.083% nebulizer solution 3 mL (not administered)  cholecalciferol (VITAMIN D) tablet 1,000 Units (not administered)  atorvastatin (LIPITOR) tablet 20 mg (not administered)  amitriptyline (ELAVIL) tablet 50 mg (not administered)  amLODipine (NORVASC) tablet 5 mg (not administered)  atenolol (TENORMIN) tablet 50 mg (not administered)  ezetimibe (ZETIA) tablet 10 mg (not administered)  loratadine (CLARITIN) tablet 10 mg (not administered)  multivitamin (PROSIGHT) tablet 1 tablet (not administered)  morphine 4 MG/ML injection 4 mg (4 mg Intravenous Given 01/23/16 1206)  ondansetron (ZOFRAN) injection 4 mg (4 mg Intravenous Given 01/23/16 1206)  sodium chloride 0.9 % bolus 1,000 mL (0 mLs Intravenous Stopped 01/23/16 1310)  morphine 4 MG/ML injection 4 mg (4 mg Intravenous Given 01/23/16 1234)  iopamidol (ISOVUE-370) 76 % injection (100 mLs  Contrast Given 01/23/16 1250)  piperacillin-tazobactam (ZOSYN) IVPB 3.375 g (0 g Intravenous Stopped 01/23/16 1538)  sodium chloride 0.9 % bolus 1,000 mL (0 mLs Intravenous Stopped 01/23/16 1723)  HYDROmorphone (DILAUDID) injection 0.5 mg (0.5 mg Intravenous Given 01/23/16 1511)  sodium chloride 0.9 % bolus 1,000 mL (0 mLs Intravenous Stopped 01/23/16 1725)  acetaminophen (TYLENOL) suppository 650 mg (650 mg Rectal Given 01/23/16 1535)  albuterol (PROVENTIL) (2.5 MG/3ML) 0.083% nebulizer solution 2.5 mg (2.5 mg Nebulization  Given 01/23/16 2059)     Initial Impression / Assessment and Plan / ED Course  I have reviewed the triage vital signs and the nursing notes.  Pertinent labs & imaging results that were available during my care of the patient were reviewed by me and considered in my medical decision making (see chart for details).  Clinical Course     75  yo F With a chief complaint of diffuse abdominal pain. Pain is out of proportion on my exam. Will obtain a CT angios study of the abdomen.  CT scan is concerning for acute appendicitis. There is some question whether or not there is a microperforation. Due to her advanced age and continued pain I'll give her Zosyn, given another fluid bolus. On my reassessment after 8 mg of morphine patient is still having significant tenderness. She is hypoxic into the mid 80s and placed her on 3 L of oxygen. Blood pressure was soft as well. Will give another bolus of fluids. Discussed with general surgery.   CRITICAL CARE Performed by: Cecilio Asper   Total critical care time: 35 minutes  Critical care time was exclusive of separately billable procedures and treating other patients.  Critical care was necessary to treat or prevent imminent or life-threatening deterioration.  Critical care was time spent personally by me on the following activities: development of treatment plan with patient and/or surrogate as well as nursing, discussions with consultants, evaluation of patient's response to treatment, examination of patient, obtaining history from patient or surrogate, ordering and performing treatments and interventions, ordering and review of laboratory studies, ordering and review of radiographic studies, pulse oximetry and re-evaluation of patient's condition.   The patients results and plan were reviewed and discussed.   Any x-rays performed were independently reviewed by myself.   Differential diagnosis were considered with the presenting  HPI.  Medications  piperacillin-tazobactam (ZOSYN) IVPB 3.375 g (3.375 g Intravenous New Bag/Given 01/24/16 1417)  diphenhydrAMINE (BENADRYL) 12.5 MG/5ML elixir 12.5 mg ( Oral MAR Unhold 01/23/16 2255)    Or  diphenhydrAMINE (BENADRYL) injection 12.5 mg ( Intravenous MAR Unhold 01/23/16 2255)  ondansetron (ZOFRAN-ODT) disintegrating tablet 4 mg ( Oral MAR Unhold 01/23/16 2255)    Or  ondansetron (ZOFRAN) injection 4 mg ( Intravenous MAR Unhold 01/23/16 2255)  0.9 % NaCl with KCl 20 mEq/ L  infusion ( Intravenous New Bag/Given 01/24/16 1619)  mometasone-formoterol (DULERA) 200-5 MCG/ACT inhaler 2 puff (2 puffs Inhalation Given 01/24/16 0750)  traMADol (ULTRAM) tablet 50 mg (50 mg Oral Given 01/24/16 1327)  docusate sodium (COLACE) capsule 100 mg (100 mg Oral Given 01/24/16 0953)  senna (SENOKOT) tablet 8.6 mg (8.6 mg Oral Given 01/24/16 0953)  bisacodyl (DULCOLAX) suppository 10 mg (not administered)  HYDROmorphone (DILAUDID) injection 0.5-1 mg (1 mg Intravenous Given 01/24/16 1619)  acetaminophen (TYLENOL) tablet 1,000 mg (1,000 mg Oral Given 01/24/16 1327)  albuterol (PROVENTIL) (2.5 MG/3ML) 0.083% nebulizer solution 3 mL (not administered)  cholecalciferol (VITAMIN D) tablet 1,000 Units (not administered)  atorvastatin (LIPITOR) tablet 20 mg (not administered)  amitriptyline (ELAVIL) tablet 50 mg (not administered)  amLODipine (NORVASC) tablet 5 mg (not administered)  atenolol (TENORMIN) tablet 50 mg (not administered)  ezetimibe (ZETIA) tablet 10 mg (not administered)  loratadine (CLARITIN) tablet 10 mg (not administered)  multivitamin (PROSIGHT) tablet 1 tablet (not administered)  morphine 4 MG/ML injection 4 mg (4 mg Intravenous Given 01/23/16 1206)  ondansetron (ZOFRAN) injection 4 mg (4 mg Intravenous Given 01/23/16 1206)  sodium chloride 0.9 % bolus 1,000 mL (0 mLs Intravenous Stopped 01/23/16 1310)  morphine 4 MG/ML injection 4 mg (4 mg Intravenous Given 01/23/16 1234)   iopamidol (ISOVUE-370) 76 % injection (100 mLs  Contrast Given 01/23/16 1250)  piperacillin-tazobactam (ZOSYN) IVPB 3.375 g (0 g Intravenous Stopped 01/23/16 1538)  sodium chloride 0.9 % bolus 1,000 mL (0 mLs Intravenous Stopped 01/23/16 1723)  HYDROmorphone (DILAUDID) injection 0.5 mg (  0.5 mg Intravenous Given 01/23/16 1511)  sodium chloride 0.9 % bolus 1,000 mL (0 mLs Intravenous Stopped 01/23/16 1725)  acetaminophen (TYLENOL) suppository 650 mg (650 mg Rectal Given 01/23/16 1535)  albuterol (PROVENTIL) (2.5 MG/3ML) 0.083% nebulizer solution 2.5 mg (2.5 mg Nebulization Given 01/23/16 2059)    Vitals:   01/24/16 0649 01/24/16 0756 01/24/16 1100 01/24/16 1601  BP: 136/65  135/61 (!) 125/52  Pulse: 85  81 87  Resp: (!) 23  20 18   Temp: 97.4 F (36.3 C)  97.6 F (36.4 C) 97.6 F (36.4 C)  TempSrc: Oral  Oral Oral  SpO2: 97% 96% 96% 96%  Weight:      Height:        Final diagnoses:  Acute appendicitis with generalized peritonitis    Admission/ observation were discussed with the admitting physician, patient and/or family and they are comfortable with the plan.    Final Clinical Impressions(s) / ED Diagnoses   Final diagnoses:  Acute appendicitis with generalized peritonitis    New Prescriptions Current Discharge Medication List       Deno Etienne, DO 01/24/16 1714

## 2016-01-23 NOTE — Anesthesia Procedure Notes (Signed)
Procedure Name: Intubation Date/Time: 01/23/2016 6:38 PM Performed by: Laretta Alstrom Pre-anesthesia Checklist: Patient identified, Emergency Drugs available, Suction available and Patient being monitored Patient Re-evaluated:Patient Re-evaluated prior to inductionOxygen Delivery Method: Circle System Utilized Preoxygenation: Pre-oxygenation with 100% oxygen Intubation Type: IV induction, Rapid sequence and Cricoid Pressure applied Laryngoscope Size: Mac and 3 Grade View: Grade I Tube type: Oral Tube size: 7.0 mm Number of attempts: 1 Airway Equipment and Method: Stylet Placement Confirmation: ETT inserted through vocal cords under direct vision,  positive ETCO2 and breath sounds checked- equal and bilateral Secured at: 21 cm Tube secured with: Tape Dental Injury: Teeth and Oropharynx as per pre-operative assessment

## 2016-01-23 NOTE — Progress Notes (Signed)
Pharmacy Antibiotic Note  RHAEGAN BRANDIN is a 75 y.o. female admitted on 01/23/2016 with appendicitis with possible perforation.  Pharmacy has been consulted for Zosyn dosing.  Baseline labs reviewed.  Plan: - Zosyn 3.375gm IV Q8H, 4 hr infusion - Pharmacy will sign off as dosage adjustment is likely unnecessary.  Thank you for the consult!  Height: 5\' 8"  (172.7 cm) Weight: 155 lb (70.3 kg) IBW/kg (Calculated) : 63.9  Temp (24hrs), Avg:98.6 F (37 C), Min:98.6 F (37 C), Max:98.6 F (37 C)   Recent Labs Lab 01/23/16 1121 01/23/16 1221  WBC 18.7*  --   CREATININE 0.63  --   LATICACIDVEN  --  1.40    Estimated Creatinine Clearance: 59.4 mL/min (by C-G formula based on SCr of 0.63 mg/dL).    Allergies  Allergen Reactions  . Eggs Or Egg-Derived Products     Skin nodules    Antimicrobials this admission:  Zosyn 11/10 >>  Dose adjustments this admission:  N/A  Microbiology results:  11/10 BCx x2 -    Remigio Eisenmenger D. Mina Marble, PharmD, BCPS Pager:  [phone] 01/23/2016, 4:02 PM

## 2016-01-23 NOTE — Consult Note (Signed)
Reason for Consult: Appendicitis with possible perforation Referring Physician: Dr. Ellin Mayhew (ED)  Heidi Hobbs is an 75 y.o. female.  HPI: pt presents with the abdominal pain that started last PM after eating [employer].  She has been nauseated but has not vomited, some chills last pM. .  She presents now with very severe pain, nausea and constipation.  Work up in the ED shows she is afebrile, Tachycardic, BP is trending down some. Na 133, K+ 3.5, glucose 133, WBC 18.7, UA nitrate negative.  CT scan shows:  Acute appendicitis. Note that the cecum is within the left abdomen, with the appendix tracking from left to right over the midline.  Inflammatory changes in the adjacent fat, with questionable flocculent changes either within the appendiceal tip or representing micro perforation.  Currently Temp 101.3, HR 109,137/57.  Past Medical History:  Diagnosis Date  . Adenomatous colon polyp 1995, 2011  . Anxiety   . Carotid stenosis    40-59% right, less than 40% left  . Cataracts, bilateral   . COPD (chronic obstructive pulmonary disease) ([place])   . Hyperlipidemia   . Hypertension   . MAC (mycobacterium avium-intracellulare complex)   . Polio 1959  . Stomach ulcer 1960  . Vasculitis ([place])    Normocomplementemic urticarial     Past Surgical History:  Procedure Laterality Date  . BREAST ENHANCEMENT SURGERY     removal  . CATARACT EXTRACTION    . COLONOSCOPY    . COLONOSCOPY  10/14/09  . POLYPECTOMY  2011   x8  . TONSILLECTOMY AND ADENOIDECTOMY    . VAGINAL HYSTERECTOMY  1970   uterine cancer    Family History  Problem Relation Age of Onset  . Colon cancer Father   . Esophageal cancer Brother   . Esophageal cancer Brother   . Asthma Daughter     as a child  . Stomach cancer Neg Hx   . Rectal cancer Neg Hx     Social History:  reports that she has been smoking Cigarettes.  She has a 25.00 pack-year smoking history. She has never used smokeless tobacco. She reports that she does not  drink alcohol or use drugs. Tobacco 63 years < 1PPD ETOH none Drugs:  None Worked Youth worker   Allergies:  Allergies  Allergen Reactions  . Eggs Or Egg-Derived Products     Skin nodules    Prior to Admission medications   Medication Sig Start Date End Date Taking? Authorizing Provider  amitriptyline (ELAVIL) 100 MG tablet Take 100 mg by mouth at bedtime.    Historical Provider, MD  amLODipine (NORVASC) 5 MG tablet Take 10 mg by mouth daily.     Historical Provider, MD  aspirin 81 MG EC tablet Take 81 mg by mouth.    Historical Provider, MD  atenolol (TENORMIN) 50 MG tablet Take 50 mg by mouth daily.    Historical Provider, MD  atorvastatin (LIPITOR) 40 MG tablet Take 20 mg by mouth daily.    Historical Provider, MD  budesonide-formoterol (SYMBICORT) 160-4.5 MCG/ACT inhaler Inhale 2 puffs into the lungs 2 (two) times daily as needed.    Historical Provider, MD  Cholecalciferol (VITAMIN D3) 2000 UNITS TABS Take 1 tablet by mouth daily.    Historical Provider, MD  colchicine 0.6 MG tablet Take 0.6 mg by mouth 2 (two) times daily.    Historical Provider, MD  Cyanocobalamin (VITAMIN B-12 IJ) Inject as directed every 30 (thirty) days. Reported on 07/31/2015  Historical Provider, MD  Cyanocobalamin (VITAMIN B-12 PO) Take by mouth. 3000 iu daily    Historical Provider, MD  dapsone 100 MG tablet Take 50 mg by mouth daily.    Historical Provider, MD  ezetimibe (ZETIA) 10 MG tablet Take 10 mg by mouth daily.    Historical Provider, MD  montelukast (SINGULAIR) 10 MG tablet Take 10 mg by mouth at bedtime.    Historical Provider, MD  Multiple Vitamins-Minerals (PRESERVISION AREDS 2) CAPS Take 2 tablets by mouth 2 (two) times daily.    Historical Provider, MD  ranitidine (ZANTAC) 150 MG tablet Take 150 mg by mouth 2 (two) times daily.    Historical Provider, MD     Results for orders placed or performed during the hospital encounter of 01/23/16 (from the past 48 hour(s))   Lipase, blood     Status: None   Collection Time: 01/23/16 11:21 AM  Result Value Ref Range   Lipase 20 11 - 51 U/L  Comprehensive metabolic panel     Status: Abnormal   Collection Time: 01/23/16 11:21 AM  Result Value Ref Range   Sodium 133 (L) 135 - 145 mmol/L   Potassium 3.5 3.5 - 5.1 mmol/L   Chloride 100 (L) 101 - 111 mmol/L   CO2 21 (L) 22 - 32 mmol/L   Glucose, Bld 133 (H) 65 - 99 mg/dL   BUN 7 6 - 20 mg/dL   Creatinine, Ser 0.63 0.44 - 1.00 mg/dL   Calcium 8.8 (L) 8.9 - 10.3 mg/dL   Total Protein 7.1 6.5 - 8.1 g/dL   Albumin 3.5 3.5 - 5.0 g/dL   AST 21 15 - 41 U/L   ALT 14 14 - 54 U/L   Alkaline Phosphatase 71 38 - 126 U/L   Total Bilirubin 0.4 0.3 - 1.2 mg/dL   GFR calc non Af Amer >60 >60 mL/min   GFR calc Af Amer >60 >60 mL/min    Comment: (NOTE) The eGFR has been calculated using the CKD EPI equation. This calculation has not been validated in all clinical situations. eGFR's persistently <60 mL/min signify possible Chronic Kidney Disease.    Anion gap 12 5 - 15  CBC     Status: Abnormal   Collection Time: 01/23/16 11:21 AM  Result Value Ref Range   WBC 18.7 (H) 4.0 - 10.5 K/uL   RBC 4.50 3.87 - 5.11 MIL/uL   Hemoglobin 13.3 12.0 - 15.0 g/dL   HCT 39.6 36.0 - 46.0 %   MCV 88.0 78.0 - 100.0 fL   MCH 29.6 26.0 - 34.0 pg   MCHC 33.6 30.0 - 36.0 g/dL   RDW 14.3 11.5 - 15.5 %   Platelets 347 150 - 400 K/uL  I-Stat CG4 Lactic Acid, ED     Status: None   Collection Time: 01/23/16 12:21 PM  Result Value Ref Range   Lactic Acid, Venous 1.40 0.5 - 1.9 mmol/L  Urinalysis, Routine w reflex microscopic     Status: Abnormal   Collection Time: 01/23/16  1:25 PM  Result Value Ref Range   Color, Urine YELLOW YELLOW   APPearance CLEAR CLEAR   Specific Gravity, Urine 1.017 1.005 - 1.030   pH 7.5 5.0 - 8.0   Glucose, UA NEGATIVE NEGATIVE mg/dL   Hgb urine dipstick SMALL (A) NEGATIVE   Bilirubin Urine NEGATIVE NEGATIVE   Ketones, ur NEGATIVE NEGATIVE mg/dL   Protein,  ur NEGATIVE NEGATIVE mg/dL   Nitrite NEGATIVE NEGATIVE   Leukocytes, UA NEGATIVE NEGATIVE  Urine microscopic-add on     Status: Abnormal   Collection Time: 01/23/16  1:25 PM  Result Value Ref Range   Squamous Epithelial / LPF 0-5 (A) NONE SEEN   WBC, UA 0-5 0 - 5 WBC/hpf   RBC / HPF 0-5 0 - 5 RBC/hpf   Bacteria, UA MANY (A) NONE SEEN    Ct Angio Abd/pel W And/or Wo Contrast  Result Date: 01/23/2016 CLINICAL DATA:  75 year old female with a history of lower abdominal pain EXAM: CT ABDOMEN AND PELVIS WITH CONTRAST TECHNIQUE: Multidetector CT imaging of the abdomen and pelvis was performed using the standard protocol following bolus administration of intravenous contrast. CONTRAST:  100 cc Isovue 370 COMPARISON:  06/22/2011 FINDINGS: Lower chest: Architectural distortion of the bilateral lower lobes. No confluent airspace disease or pleural effusion. No pericardial fluid/ thickening. Moderate-sized hiatal hernia. Hepatobiliary: No focal liver abnormality is seen. Geographic hyperdensity of predominately left liver lobe, likely related to either focal fatty infiltration/sparing or flow phenomenon. No evidence of portal vein compromise or hepatic vein compromise. No gallstones, gallbladder wall thickening, or biliary dilatation. Pancreas: Unremarkable. No pancreatic ductal dilatation or surrounding inflammatory changes. Spleen: Normal in size without focal abnormality. Adrenals/Urinary Tract: Lobulated appearance of the bilateral adrenal glands. On the right, there is a nodule measuring approximately 10 mm. On the left there are several confluent nodules involving the main body as well as the lateral and medial limbs. Over time this has not changed in appearance. Stomach/Bowel: Thickened appendix with inspissated secretions at the appendix base. Inflammatory changes along the fat of the meso appendix. Flocculent changes at the tip of the appendix, which may or may not be within the lumen. Trace amount of  fluid within the adjacent peritoneal leaves. Note that the cecum is on the left aspect of the abdomen. Colonic diverticular disease without evidence of diverticulitis. Unremarkable appearance of small bowel with no transition. Moderate-sized hiatal hernia.  Unremarkable stomach. Vascular/Lymphatic: Calcifications of the abdominal aorta. No aneurysm. No dissection. Calcifications the bilateral iliac vasculature. Calcifications bilateral renal artery origin. Reproductive: Unremarkable appearance of the pelvic vessels. Other: No abdominal wall hernia or abnormality. No abdominopelvic ascites. Musculoskeletal: No displaced fracture. Mild degenerative changes of the spine. IMPRESSION: Acute appendicitis. Note that the cecum is within the left abdomen, with the appendix tracking from left to right over the midline. Inflammatory changes in the adjacent fat, with questionable flocculent changes either within the appendiceal tip or representing micro perforation. Aortic atherosclerosis, with changes at the origin of the mesenteric vessels, however, with no evidence of vascular compromise. Diverticular disease without evidence of acute diverticulitis. These results were called by telephone at the time of interpretation on 01/23/2016 at 1:39 pm to Dr. Deno Etienne , who verbally acknowledged these results. Signed, Dulcy Fanny. Earleen Newport, DO [facility] Electronically Signed   By: Corrie Mckusick D.O.   On: 01/23/2016 13:41    Review of Systems  Constitutional: Positive for chills and fever. Negative for diaphoresis, malaise/fatigue and weight loss.  HENT: Negative.   Eyes: Negative.   Respiratory: Negative.   Cardiovascular: Negative.   Gastrointestinal: Positive for abdominal pain, constipation and nausea. Negative for blood in stool, diarrhea, melena and vomiting.  Genitourinary:       Very slow voiding  Musculoskeletal: Positive for back pain.  Skin: Negative.    Neurological: Negative.  Negative for weakness.  Endo/Heme/Allergies: Negative.   Psychiatric/Behavioral: Negative.    Blood pressure 153/75, pulse 117, temperature 98.6 F (37  C), temperature source Oral, resp. rate 24, height 5' 8"  (1.727 m), weight 70.3 kg (155 lb), SpO2 95 %. Physical Exam  Constitutional: She is oriented to person, place, and time.  Elderly female in no distress, fever and tachycardic.  BP OK  HENT:  Head: Normocephalic and atraumatic.  Mouth/Throat: No oropharyngeal exudate.  Eyes: Right eye exhibits no discharge. Left eye exhibits no discharge. No scleral icterus.  Neck: Normal range of motion. Neck supple. No JVD present. No tracheal deviation present. No thyromegaly present.  Cardiovascular: Regular rhythm, normal heart sounds and intact distal pulses.   No murmur heard. Respiratory: Effort normal and breath sounds normal. No respiratory distress. She has no wheezes.  GI: She exhibits distension. She exhibits no mass. There is tenderness (across the entire mid abdome, most tender RLQ). There is no rebound and no guarding.  Musculoskeletal: She exhibits no edema or tenderness.  Lymphadenopathy:    She has no cervical adenopathy.  Neurological: She is alert and oriented to person, place, and time. No cranial nerve deficit.  Skin: Skin is warm and dry. No rash noted. No erythema. No pallor.  Psychiatric: She has a normal mood and affect. Her behavior is normal. Judgment and thought content normal.    Assessment/Plan: Acute appendicitis with possible perforation Early Sepis Carotid stenosis Vasculitis - Rx at [facility] COPD/tobcco use 63 years Hypertension Hyperlipidemia Macular degeneration - dry  Plan:  Admit for surgery later today.  Starting her on Zosyn and fluids in the ED.  Will send to step down for now.      Heidi Hobbs 01/23/2016, 2:11 PM

## 2016-01-24 ENCOUNTER — Encounter (HOSPITAL_COMMUNITY): Payer: Self-pay | Admitting: Surgery

## 2016-01-24 LAB — BASIC METABOLIC PANEL
Anion gap: 7 (ref 5–15)
CHLORIDE: 107 mmol/L (ref 101–111)
CO2: 23 mmol/L (ref 22–32)
Calcium: 8.2 mg/dL — ABNORMAL LOW (ref 8.9–10.3)
Creatinine, Ser: 0.46 mg/dL (ref 0.44–1.00)
GFR calc Af Amer: >60 mL/min (ref >60)
GFR calc non Af Amer: >60 mL/min (ref >60)
GLUCOSE: 150 mg/dL — AB (ref 65–99)
POTASSIUM: 3.4 mmol/L — AB (ref 3.5–5.1)
Sodium: 137 mmol/L (ref 135–145)

## 2016-01-24 LAB — CBC
HEMATOCRIT: 35.3 % — AB (ref 36.0–46.0)
HEMOGLOBIN: 11.4 g/dL — AB (ref 12.0–15.0)
MCH: 28.9 pg (ref 26.0–34.0)
MCHC: 32.3 g/dL (ref 30.0–36.0)
MCV: 89.6 fL (ref 78.0–100.0)
Platelets: 285 10*3/uL (ref 150–400)
RBC: 3.94 MIL/uL (ref 3.87–5.11)
RDW: 14.5 % (ref 11.5–15.5)
WBC: 20.9 10*3/uL — ABNORMAL HIGH (ref 4.0–10.5)

## 2016-01-24 LAB — MRSA PCR SCREENING: MRSA BY PCR: NEGATIVE

## 2016-01-24 LAB — MAGNESIUM: MAGNESIUM: 1.7 mg/dL (ref 1.7–2.4)

## 2016-01-24 MED ORDER — EZETIMIBE 10 MG PO TABS
10.0000 mg | ORAL_TABLET | Freq: Every day | ORAL | Status: DC
Start: 2016-01-24 — End: 2016-02-01
  Administered 2016-01-24 – 2016-02-01 (×9): 10 mg via ORAL
  Filled 2016-01-24 (×9): qty 1

## 2016-01-24 MED ORDER — AMLODIPINE BESYLATE 5 MG PO TABS
5.0000 mg | ORAL_TABLET | Freq: Every day | ORAL | Status: DC
  Administered 2016-01-25 – 2016-02-01 (×8): 5 mg via ORAL
  Filled 2016-01-24 (×8): qty 1

## 2016-01-24 MED ORDER — ATORVASTATIN CALCIUM 20 MG PO TABS
20.0000 mg | ORAL_TABLET | Freq: Every day | ORAL | Status: DC
  Administered 2016-01-24 – 2016-02-01 (×9): 20 mg via ORAL
  Filled 2016-01-24 (×9): qty 1

## 2016-01-24 MED ORDER — LORATADINE 10 MG PO TABS
10.0000 mg | ORAL_TABLET | Freq: Every day | ORAL | Status: DC
  Administered 2016-01-25 – 2016-02-01 (×8): 10 mg via ORAL
  Filled 2016-01-24 (×8): qty 1

## 2016-01-24 MED ORDER — ACETAMINOPHEN 500 MG PO TABS
1000.0000 mg | ORAL_TABLET | Freq: Four times a day (QID) | ORAL | Status: DC
  Administered 2016-01-24 – 2016-01-26 (×7): 1000 mg via ORAL
  Filled 2016-01-24 (×7): qty 2

## 2016-01-24 MED ORDER — HYDROMORPHONE HCL 1 MG/ML IJ SOLN
0.5000 mg | INTRAMUSCULAR | Status: DC | PRN
  Administered 2016-01-24 – 2016-01-26 (×10): 1 mg via INTRAVENOUS
  Filled 2016-01-24 (×10): qty 1

## 2016-01-24 MED ORDER — ATENOLOL 50 MG PO TABS
50.0000 mg | ORAL_TABLET | Freq: Every day | ORAL | Status: DC
  Administered 2016-01-24 – 2016-02-01 (×9): 50 mg via ORAL
  Filled 2016-01-24 (×9): qty 1

## 2016-01-24 MED ORDER — AMITRIPTYLINE HCL 50 MG PO TABS
50.0000 mg | ORAL_TABLET | Freq: Every day | ORAL | Status: DC
  Administered 2016-01-24 – 2016-01-31 (×8): 50 mg via ORAL
  Filled 2016-01-24 (×8): qty 1

## 2016-01-24 MED ORDER — VITAMIN D 1000 UNITS PO TABS
1000.0000 [IU] | ORAL_TABLET | Freq: Every day | ORAL | Status: DC
  Administered 2016-01-25 – 2016-02-01 (×8): 1000 [IU] via ORAL
  Filled 2016-01-24 (×8): qty 1

## 2016-01-24 MED ORDER — ALBUTEROL SULFATE (2.5 MG/3ML) 0.083% IN NEBU: 3.0000 mL | INHALATION_SOLUTION | Freq: Four times a day (QID) | RESPIRATORY_TRACT | Status: DC | PRN

## 2016-01-24 MED ORDER — LEVOCETIRIZINE DIHYDROCHLORIDE 5 MG PO TABS: 5.0000 mg | ORAL_TABLET | Freq: Every day | ORAL | Status: DC

## 2016-01-24 MED ORDER — PROSIGHT PO TABS
1.0000 | ORAL_TABLET | Freq: Every day | ORAL | Status: DC
  Administered 2016-01-25 – 2016-02-01 (×8): 1 via ORAL
  Filled 2016-01-24 (×8): qty 1

## 2016-01-24 MED ORDER — PRESERVISION AREDS 2 PO CAPS: 2.0000 | ORAL_CAPSULE | Freq: Two times a day (BID) | ORAL | Status: DC

## 2016-01-24 NOTE — Progress Notes (Signed)
Heidi Hobbs Progress Note 1 Day Post-Op  Subjective: Patient very nice and talkative.  Complaining of bladder discomfort and pain with urination.  Objective: Vital signs in last 24 hours: Temp:  [97.4 F (36.3 C)-100.3 F (37.9 C)] 97.4 F (36.3 C) (11/11 0649) Pulse Rate:  [83-117] 85 (11/11 0649) Resp:  [7-28] 23 (11/11 0649) BP: (99-163)/(50-89) 136/65 (11/11 0649) SpO2:  [87 %-97 %] 96 % (11/11 0756) Weight:  [75.7 kg (166 lb 14.2 oz)] 75.7 kg (166 lb 14.2 oz) (11/10 2250) Last BM Date: 01/21/16  Intake/Output from previous day: 11/10 0701 - 11/11 0700 In: 4087.5 [I.V.:3037.5; IV Piggyback:1050] Out: [id number] [Urine:1450; Drains:168; Blood:50] Intake/Output this shift: No intake/output data recorded.  General: No acute distress.  Lungs: Clear.  Oxygen saturations 98% on 2L  Abd: Soft, minimally tender, hypoactive bowel sounds.  Taking clear liquids.  Extremities: No clinical signs or symptoms of DVT  Neuro: Intact  Lab Results:  @LABLAST2 (wbc:2,hgb:2,hct:2,plt:2) BMET ) Recent Labs  01/23/16 1121 01/24/16 0343  NA 133* 137  K 3.5 3.4*  CL 100* 107  CO2 21* 23  GLUCOSE 133* 150*  BUN 7 <5*  CREATININE 0.63 0.46  CALCIUM 8.8* 8.2*   PT/INR  Recent Labs  01/23/16 1603  LABPROT 14.4  INR 1.11   ABG No results for input(s): PHART, HCO3 in the last 72 hours.  Invalid input(s): PCO2, PO2  Studies/Results: Ct Angio Abd/pel W And/or Wo Contrast  Result Date: 01/23/2016 CLINICAL DATA:  75 year old female with a history of lower abdominal pain EXAM: CT ABDOMEN AND PELVIS WITH CONTRAST TECHNIQUE: Multidetector CT imaging of the abdomen and pelvis was performed using the standard protocol following bolus administration of intravenous contrast. CONTRAST:  100 cc Isovue 370 COMPARISON:  06/22/2011 FINDINGS: Lower chest: Architectural distortion of the bilateral lower lobes. No confluent airspace disease or pleural effusion. No pericardial fluid/ thickening.  Moderate-sized hiatal hernia. Hepatobiliary: No focal liver abnormality is seen. Geographic hyperdensity of predominately left liver lobe, likely related to either focal fatty infiltration/sparing or flow phenomenon. No evidence of portal vein compromise or hepatic vein compromise. No gallstones, gallbladder wall thickening, or biliary dilatation. Pancreas: Unremarkable. No pancreatic ductal dilatation or surrounding inflammatory changes. Spleen: Normal in size without focal abnormality. Adrenals/Urinary Tract: Lobulated appearance of the bilateral adrenal glands. On the right, there is a nodule measuring approximately 10 mm. On the left there are several confluent nodules involving the main body as well as the lateral and medial limbs. Over time this has not changed in appearance. Stomach/Bowel: Thickened appendix with inspissated secretions at the appendix base. Inflammatory changes along the fat of the meso appendix. Flocculent changes at the tip of the appendix, which may or may not be within the lumen. Trace amount of fluid within the adjacent peritoneal leaves. Note that the cecum is on the left aspect of the abdomen. Colonic diverticular disease without evidence of diverticulitis. Unremarkable appearance of small bowel with no transition. Moderate-sized hiatal hernia.  Unremarkable stomach. Vascular/Lymphatic: Calcifications of the abdominal aorta. No aneurysm. No dissection. Calcifications the bilateral iliac vasculature. Calcifications bilateral renal artery origin. Reproductive: Unremarkable appearance of the pelvic vessels. Other: No abdominal wall hernia or abnormality. No abdominopelvic ascites. Musculoskeletal: No displaced fracture. Mild degenerative changes of the spine. IMPRESSION: Acute appendicitis. Note that the cecum is within the left abdomen, with the appendix tracking from left to right over the midline. Inflammatory changes in the adjacent fat, with questionable flocculent changes either  within the appendiceal tip or representing micro perforation. Aortic  atherosclerosis, with changes at the origin of the mesenteric vessels, however, with no evidence of vascular compromise. Diverticular disease without evidence of acute diverticulitis. These results were called by telephone at the time of interpretation on 01/23/2016 at 1:39 pm to Dr. Deno Etienne , who verbally acknowledged these results. Signed, Dulcy Fanny. Earleen Newport, DO [facility] Radiology Electronically Signed   By: Corrie Mckusick D.O.   On: 01/23/2016 13:41    Anti-infectives: Anti-infectives    Start     Dose/Rate Route Frequency Ordered Stop   01/23/16 1515  piperacillin-tazobactam (ZOSYN) IVPB 3.375 g     3.375 g 12.5 mL/hr over 240 Minutes Intravenous Every 8 hours 01/23/16 1514     01/23/16 1400  piperacillin-tazobactam (ZOSYN) IVPB 3.375 g     3.375 g 100 mL/hr over 30 Minutes Intravenous  Once 01/23/16 1347 01/23/16 1538      Assessment/Plan: s/p Procedure(s): APPENDECTOMY LAPAROSCOPIC Clear liquids.  Transfer to the floor  LOS: 1 day   [name]. Dahlia Bailiff, MD, FACS [phone] [facility] 01/24/2016

## 2016-01-24 NOTE — Progress Notes (Signed)
Pt admitted to 6N30 via bed from 3S.  Pt AAO X4.  Pt on 4L O2 via [place].  Pt has 20G to Rt FA with fluids infusing and 18G to Lt AC SL.  Pt has abd lap sites X 3 with steri-strips and band-aid and lap sites with JP on lt side with serosanguineous drainage.  Pt has SCDs in place.  Pt has no complaints at the moment.  Will continue to monitor.

## 2016-01-25 MED ORDER — BISACODYL 10 MG RE SUPP
10.0000 mg | Freq: Once | RECTAL | Status: AC
  Administered 2016-01-25: 10 mg via RECTAL
  Filled 2016-01-25: qty 1

## 2016-01-25 MED ORDER — COLCHICINE 0.6 MG PO TABS
0.6000 mg | ORAL_TABLET | Freq: Every day | ORAL | Status: DC
  Filled 2016-01-25: qty 1

## 2016-01-25 MED ORDER — PHENOL 1.4 % MT LIQD
1.0000 | OROMUCOSAL | Status: DC | PRN
  Administered 2016-01-25: 1 via OROMUCOSAL
  Filled 2016-01-25: qty 177

## 2016-01-25 MED ORDER — BOOST / RESOURCE BREEZE PO LIQD
1.0000 | Freq: Three times a day (TID) | ORAL | Status: DC
  Administered 2016-01-26 – 2016-01-27 (×5): 1 via ORAL
  Administered 2016-01-28: 237 mL via ORAL

## 2016-01-25 MED ORDER — POLYETHYLENE GLYCOL 3350 17 G PO PACK
17.0000 g | PACK | Freq: Every day | ORAL | Status: DC
  Administered 2016-01-25 – 2016-01-27 (×2): 17 g via ORAL
  Filled 2016-01-25 (×3): qty 1

## 2016-01-25 MED ORDER — COLCHICINE 0.6 MG PO TABS: 0.6000 mg | ORAL_TABLET | Freq: Every day | ORAL | Status: DC

## 2016-01-25 NOTE — Progress Notes (Signed)
Patient ID: Heidi Hobbs, female   DOB: 02/14/1941, 75 y.o.   MRN: [id number]  [facility] Surgery Progress Note  2 Days Post-Op  Subjective: Still very sore. Concerned that she has not had a BM. +flatus this Am. Denies n/v, tolerating clears  Objective: Vital signs in last 24 hours: Temp:  [97 F (36.1 C)-98.4 F (36.9 C)] 98.4 F (36.9 C) (11/12 0427) Pulse Rate:  [71-91] 76 (11/12 0427) Resp:  [18-20] 19 (11/12 0427) BP: (125-154)/(52-67) 154/54 (11/12 0427) SpO2:  [95 %-96 %] 95 % (11/12 0427) Last BM Date: 01/21/16  Intake/Output from previous day: 11/11 0701 - 11/12 0700 In: 1146.3 [P.O.:240; I.V.:806.3; IV Piggyback:100] Out: 1190 [Urine:1160; Drains:30] Intake/Output this shift: Total I/O In: -  Out: 50 [Drains:50]  PE: Gen:  Alert, NAD, pleasant Card:  RRR Pulm:  CTA Abd: Soft, mild distension, appropriately tender, +BS, incisions C/D/I, drain with minimal serosanguinous drainage  Lab Results:   Recent Labs  01/23/16 1121 01/24/16 0343  WBC 18.7* 20.9*  HGB 13.3 11.4*  HCT 39.6 35.3*  PLT 347 285   BMET  Recent Labs  01/23/16 1121 01/24/16 0343  NA 133* 137  K 3.5 3.4*  CL 100* 107  CO2 21* 23  GLUCOSE 133* 150*  BUN 7 <5*  CREATININE 0.63 0.46  CALCIUM 8.8* 8.2*   PT/INR  Recent Labs  01/23/16 1603  LABPROT 14.4  INR 1.11   CMP     Component Value Date/Time   NA 137 01/24/2016 0343   K 3.4 (L) 01/24/2016 0343   CL 107 01/24/2016 0343   CO2 23 01/24/2016 0343   GLUCOSE 150 (H) 01/24/2016 0343   BUN <5 (L) 01/24/2016 0343   CREATININE 0.46 01/24/2016 0343   CALCIUM 8.2 (L) 01/24/2016 0343   PROT 7.1 01/23/2016 1121   ALBUMIN 3.5 01/23/2016 1121   AST 21 01/23/2016 1121   ALT 14 01/23/2016 1121   ALKPHOS 71 01/23/2016 1121   BILITOT 0.4 01/23/2016 1121   GFRNONAA >60 01/24/2016 0343   GFRAA >60 01/24/2016 0343   Lipase     Component Value Date/Time   LIPASE 20 01/23/2016 1121       Studies/Results: Ct  Angio Abd/pel W And/or Wo Contrast  Result Date: 01/23/2016 CLINICAL DATA:  75 year old female with a history of lower abdominal pain EXAM: CT ABDOMEN AND PELVIS WITH CONTRAST TECHNIQUE: Multidetector CT imaging of the abdomen and pelvis was performed using the standard protocol following bolus administration of intravenous contrast. CONTRAST:  100 cc Isovue 370 COMPARISON:  06/22/2011 FINDINGS: Lower chest: Architectural distortion of the bilateral lower lobes. No confluent airspace disease or pleural effusion. No pericardial fluid/ thickening. Moderate-sized hiatal hernia. Hepatobiliary: No focal liver abnormality is seen. Geographic hyperdensity of predominately left liver lobe, likely related to either focal fatty infiltration/sparing or flow phenomenon. No evidence of portal vein compromise or hepatic vein compromise. No gallstones, gallbladder wall thickening, or biliary dilatation. Pancreas: Unremarkable. No pancreatic ductal dilatation or surrounding inflammatory changes. Spleen: Normal in size without focal abnormality. Adrenals/Urinary Tract: Lobulated appearance of the bilateral adrenal glands. On the right, there is a nodule measuring approximately 10 mm. On the left there are several confluent nodules involving the main body as well as the lateral and medial limbs. Over time this has not changed in appearance. Stomach/Bowel: Thickened appendix with inspissated secretions at the appendix base. Inflammatory changes along the fat of the meso appendix. Flocculent changes at the tip of the appendix, which may or may  not be within the lumen. Trace amount of fluid within the adjacent peritoneal leaves. Note that the cecum is on the left aspect of the abdomen. Colonic diverticular disease without evidence of diverticulitis. Unremarkable appearance of small bowel with no transition. Moderate-sized hiatal hernia.  Unremarkable stomach. Vascular/Lymphatic: Calcifications of the abdominal aorta. No aneurysm. No  dissection. Calcifications the bilateral iliac vasculature. Calcifications bilateral renal artery origin. Reproductive: Unremarkable appearance of the pelvic vessels. Other: No abdominal wall hernia or abnormality. No abdominopelvic ascites. Musculoskeletal: No displaced fracture. Mild degenerative changes of the spine. IMPRESSION: Acute appendicitis. Note that the cecum is within the left abdomen, with the appendix tracking from left to right over the midline. Inflammatory changes in the adjacent fat, with questionable flocculent changes either within the appendiceal tip or representing micro perforation. Aortic atherosclerosis, with changes at the origin of the mesenteric vessels, however, with no evidence of vascular compromise. Diverticular disease without evidence of acute diverticulitis. These results were called by telephone at the time of interpretation on 01/23/2016 at 1:39 pm to Dr. Deno Etienne , who verbally acknowledged these results. Signed, Dulcy Fanny. Earleen Newport, DO [facility] Radiology Electronically Signed   By: Corrie Mckusick D.O.   On: 01/23/2016 13:41    Anti-infectives: Anti-infectives    Start     Dose/Rate Route Frequency Ordered Stop   01/23/16 1515  piperacillin-tazobactam (ZOSYN) IVPB 3.375 g     3.375 g 12.5 mL/hr over 240 Minutes Intravenous Every 8 hours 01/23/16 1514     01/23/16 1400  piperacillin-tazobactam (ZOSYN) IVPB 3.375 g     3.375 g 100 mL/hr over 30 Minutes Intravenous  Once 01/23/16 1347 01/23/16 1538       Assessment/Plan Acute appendicitis with perforation  S/p Laparoscopic Appendectomy 01/23/16 Dr. Kae Heller  - POD 2 - drain 30cc/24hr - tolerating clears  ID - zosyn 11/10>> FEN - clears VTE - SCDs  Carotid stenosis Vasculitis - Rx at [facility] COPD/tobcco use 63 years Hypertension Hyperlipidemia Macular degeneration - dry Vasculitis - restart colchicine home dose  Plan - advance to full liquids.  Mobilize. Continue IV antibiotics and monitor drain output. labs in AM   LOS: 2 days    Jerrye Beavers , [facility] Surgery 01/25/2016, 9:22 AM Pager: [phone] Consults: [phone] Mon-Fri 7:00 am-4:30 pm Sat-Sun 7:00 am-11:30 am

## 2016-01-26 LAB — CBC
HEMATOCRIT: 33.5 % — AB (ref 36.0–46.0)
Hemoglobin: 10.9 g/dL — ABNORMAL LOW (ref 12.0–15.0)
MCH: 29 pg (ref 26.0–34.0)
MCHC: 32.5 g/dL (ref 30.0–36.0)
MCV: 89.1 fL (ref 78.0–100.0)
Platelets: 286 10*3/uL (ref 150–400)
RBC: 3.76 MIL/uL — ABNORMAL LOW (ref 3.87–5.11)
RDW: 14.6 % (ref 11.5–15.5)
WBC: 14.4 10*3/uL — AB (ref 4.0–10.5)

## 2016-01-26 LAB — MAGNESIUM: Magnesium: 1.8 mg/dL (ref 1.7–2.4)

## 2016-01-26 LAB — BASIC METABOLIC PANEL
ANION GAP: 8 (ref 5–15)
BUN: <5 mg/dL — ABNORMAL LOW (ref 6–20)
CALCIUM: 8.1 mg/dL — AB (ref 8.9–10.3)
CO2: 26 mmol/L (ref 22–32)
Chloride: 103 mmol/L (ref 101–111)
Creatinine, Ser: 0.54 mg/dL (ref 0.44–1.00)
GFR calc Af Amer: >60 mL/min (ref >60)
GFR calc non Af Amer: >60 mL/min (ref >60)
GLUCOSE: 105 mg/dL — AB (ref 65–99)
POTASSIUM: 3.1 mmol/L — AB (ref 3.5–5.1)
Sodium: 137 mmol/L (ref 135–145)

## 2016-01-26 MED ORDER — POTASSIUM CHLORIDE IN NACL 40-0.9 MEQ/L-% IV SOLN
INTRAVENOUS | Status: DC
  Administered 2016-01-26: 75 mL/h via INTRAVENOUS
  Administered 2016-01-26: 100 mL/h via INTRAVENOUS
  Administered 2016-01-27 – 2016-01-28 (×2): 75 mL/h via INTRAVENOUS
  Administered 2016-01-29: 50 mL/h via INTRAVENOUS
  Filled 2016-01-26 (×6): qty 1000

## 2016-01-26 MED ORDER — ACETAMINOPHEN 500 MG PO TABS
500.0000 mg | ORAL_TABLET | Freq: Four times a day (QID) | ORAL | Status: DC
  Administered 2016-01-26 – 2016-01-31 (×16): 500 mg via ORAL
  Filled 2016-01-26 (×17): qty 1

## 2016-01-26 MED ORDER — KETOROLAC TROMETHAMINE 15 MG/ML IJ SOLN
15.0000 mg | Freq: Four times a day (QID) | INTRAMUSCULAR | Status: DC | PRN
  Administered 2016-01-26: 15 mg via INTRAVENOUS
  Filled 2016-01-26: qty 1

## 2016-01-26 MED ORDER — COLCHICINE 0.6 MG PO TABS
0.6000 mg | ORAL_TABLET | Freq: Every day | ORAL | Status: DC
  Administered 2016-01-26 – 2016-01-31 (×6): 0.6 mg via ORAL
  Filled 2016-01-26 (×6): qty 1

## 2016-01-26 MED ORDER — HYDROCODONE-ACETAMINOPHEN 5-325 MG PO TABS
1.0000 | ORAL_TABLET | ORAL | Status: DC | PRN
  Administered 2016-01-26 – 2016-01-31 (×17): 1 via ORAL
  Filled 2016-01-26 (×17): qty 1

## 2016-01-26 MED ORDER — POTASSIUM CHLORIDE 20 MEQ/15ML (10%) PO SOLN
40.0000 meq | Freq: Once | ORAL | Status: AC
  Administered 2016-01-26: 40 meq via ORAL
  Filled 2016-01-26: qty 30

## 2016-01-26 NOTE — Progress Notes (Signed)
[place] [facility] Progress Note  3 Days Post-Op  Subjective: Still c/o abdominal pain - worse with eating and deep breathing. Multiple liquid BMs overnight. +flatus. Denies nausea/vomiting. Plans to ambulate with help from her son today.  Pulling 500-700cc on IS  Objective: Vital signs in last 24 hours: Temp:  [98.4 F (36.9 C)] 98.4 F (36.9 C) (11/13 0454) Pulse Rate:  [77-88] 79 (11/13 1046) Resp:  [19] 19 (11/13 0454) BP: (130-158)/(51-71) 152/67 (11/13 1046) SpO2:  [92 %-96 %] 96 % (11/13 1007) Last BM Date: 01/25/16  Intake/Output from previous day: 11/12 0701 - 11/13 0700 In: 1690 [P.O.:340; I.V.:1200; IV Piggyback:150] Out: 100 [Drains:100] Intake/Output this shift: No intake/output data recorded.  PE: Gen:  Alert, NAD, pleasant Card:  RRR Pulm:  CTA Abd: Soft, mild distension, appropriately tender, +BS, incisions C/D/I, drain with minimal serous drainage  Drain: 100cc/24h Lab Results:   Recent Labs  01/24/16 0343 01/26/16 0244  WBC 20.9* 14.4*  HGB 11.4* 10.9*  HCT 35.3* 33.5*  PLT 285 286   BMET  Recent Labs  01/24/16 0343 01/26/16 0244  NA 137 137  K 3.4* 3.1*  CL 107 103  CO2 23 26  GLUCOSE 150* 105*  BUN <5* <5*  CREATININE 0.46 0.54  CALCIUM 8.2* 8.1*   PT/INR  Recent Labs  01/23/16 1603  LABPROT 14.4  INR 1.11   CMP     Component Value Date/Time   NA 137 01/26/2016 0244   K 3.1 (L) 01/26/2016 0244   CL 103 01/26/2016 0244   CO2 26 01/26/2016 0244   GLUCOSE 105 (H) 01/26/2016 0244   BUN <5 (L) 01/26/2016 0244   CREATININE 0.54 01/26/2016 0244   CALCIUM 8.1 (L) 01/26/2016 0244   PROT 7.1 01/23/2016 1121   ALBUMIN 3.5 01/23/2016 1121   AST 21 01/23/2016 1121   ALT 14 01/23/2016 1121   ALKPHOS 71 01/23/2016 1121   BILITOT 0.4 01/23/2016 1121   GFRNONAA >60 01/26/2016 0244   GFRAA >60 01/26/2016 0244   Lipase     Component Value Date/Time   LIPASE 20 01/23/2016 1121    Anti-infectives: Anti-infectives    Start     Dose/Rate Route Frequency Ordered Stop   01/23/16 1515  piperacillin-tazobactam (ZOSYN) IVPB 3.375 g     3.375 g 12.5 mL/hr over 240 Minutes Intravenous Every 8 hours 01/23/16 1514     01/23/16 1400  piperacillin-tazobactam (ZOSYN) IVPB 3.375 g     3.375 g 100 mL/hr over 30 Minutes Intravenous  Once 01/23/16 1347 01/23/16 1538     Assessment/Plan Acute appendicitis with perforation  S/p Laparoscopic Appendectomy 01/23/16 Dr. Kae Heller   POD 3  drain 100cc/24hr serous  tolerating diet  WBC 14.4, trending down  Hypokalemia; 3.1 today; IV and PO replacement, re-check in AM  ID - zosyn 11/10>> FEN - clears VTE - SCDs  Carotid stenosis Vasculitis - Rx at [facility] COPD/tobcco use 63 years Hypertension Hyperlipidemia Macular degeneration - dry Vasculitis - restart colchicine home dose  Plan: continue full liquid diet, ambulate  D/c toradol injection, start low dose NORCO for increased pain control  Monitor bowel function - may need to back off of bowel regimen if having multiple watery stools daily.   LOS: 3 days    [place] [facility] 01/26/2016, 12:32 PM Pager: [phone] Consults: [phone] Mon-Fri 7:00 am-4:30 pm Sat-Sun 7:00 am-11:30 am

## 2016-01-27 LAB — BASIC METABOLIC PANEL
ANION GAP: 8 (ref 5–15)
BUN: <5 mg/dL — ABNORMAL LOW (ref 6–20)
CHLORIDE: 103 mmol/L (ref 101–111)
CO2: 26 mmol/L (ref 22–32)
CREATININE: 0.48 mg/dL (ref 0.44–1.00)
Calcium: 8.3 mg/dL — ABNORMAL LOW (ref 8.9–10.3)
GFR calc non Af Amer: >60 mL/min (ref >60)
Glucose, Bld: 107 mg/dL — ABNORMAL HIGH (ref 65–99)
POTASSIUM: 3.5 mmol/L (ref 3.5–5.1)
SODIUM: 137 mmol/L (ref 135–145)

## 2016-01-27 LAB — CBC
HEMATOCRIT: 35 % — AB (ref 36.0–46.0)
HEMOGLOBIN: 11.5 g/dL — AB (ref 12.0–15.0)
MCH: 29 pg (ref 26.0–34.0)
MCHC: 32.9 g/dL (ref 30.0–36.0)
MCV: 88.4 fL (ref 78.0–100.0)
Platelets: 320 10*3/uL (ref 150–400)
RBC: 3.96 MIL/uL (ref 3.87–5.11)
RDW: 14.4 % (ref 11.5–15.5)
WBC: 10.6 10*3/uL — AB (ref 4.0–10.5)

## 2016-01-27 NOTE — Progress Notes (Signed)
4 Days Post-Op  Subjective: Pt doing well this AM Tol FLD No BM  Objective: Vital signs in last 24 hours: Temp:  [98 F (36.7 C)] 98 F (36.7 C) (11/14 0532) Pulse Rate:  [78-79] 78 (11/14 0532) Resp:  [18] 18 (11/14 0532) BP: (152-164)/(67-71) 164/71 (11/14 0532) SpO2:  [96 %-99 %] 96 % (11/14 0532) Last BM Date: 01/25/16  Intake/Output from previous day: 11/13 0701 - 11/14 0700 In: 1960.4 [P.O.:460; I.V.:1450.4; IV Piggyback:50] Out: 1125 [Urine:1100; Drains:25] Intake/Output this shift: No intake/output data recorded.  General appearance: alert and cooperative GI: soft, approp ttp, min dist, JP SS, incision c/d/i  Lab Results:   Recent Labs  01/26/16 0244 01/27/16 0205  WBC 14.4* 10.6*  HGB 10.9* 11.5*  HCT 33.5* 35.0*  PLT 286 320   BMET  Recent Labs  01/26/16 0244 01/27/16 0205  NA 137 137  K 3.1* 3.5  CL 103 103  CO2 26 26  GLUCOSE 105* 107*  BUN <5* <5*  CREATININE 0.54 0.48  CALCIUM 8.1* 8.3*   PT/INR No results for input(s): LABPROT, INR in the last 72 hours. ABG No results for input(s): PHART, HCO3 in the last 72 hours.  Invalid input(s): PCO2, PO2  Studies/Results: No results found.  Anti-infectives: Anti-infectives    Start     Dose/Rate Route Frequency Ordered Stop   01/23/16 1515  piperacillin-tazobactam (ZOSYN) IVPB 3.375 g     3.375 g 12.5 mL/hr over 240 Minutes Intravenous Every 8 hours 01/23/16 1514     01/23/16 1400  piperacillin-tazobactam (ZOSYN) IVPB 3.375 g     3.375 g 100 mL/hr over 30 Minutes Intravenous  Once 01/23/16 1347 01/23/16 1538      Assessment/Plan: Acute appendicitis with perforation  S/p Laparoscopic Appendectomy11/10/17 Dr. Kae Heller             POD 4             drain 25cc/24hr serous             Post op ileus; tolerating diet             WBC 10.6, trending down             Hypokalemia; 3.5 today; IV and PO replacement, re-check in AM  ID - zosyn 11/10>> FEN - clears VTE - SCDs  Carotid  stenosis Vasculitis - Rx at [facility] COPD/tobcco use 63 years Hypertension Hyperlipidemia Macular degeneration - dry Vasculitis - restart colchicine home dose  Plan:  -continue full liquid diet -await bowel function -encourage ambulation  LOS: 4 days    [name]., [facility] 01/27/2016

## 2016-01-27 NOTE — Care Management Important Message (Signed)
Important Message  Patient Details  Name: Heidi Hobbs MRN: [id number] Date of Birth: 10/05/1940   Medicare Important Message Given:  Yes    Maiah Sinning 01/27/2016, 11:11 AM

## 2016-01-28 LAB — CULTURE, BLOOD (ROUTINE X 2)
Culture: NO GROWTH
Culture: NO GROWTH

## 2016-01-28 MED ORDER — ENSURE ENLIVE PO LIQD
237.0000 mL | Freq: Two times a day (BID) | ORAL | Status: DC
  Administered 2016-01-28: 237 mL via ORAL

## 2016-01-28 MED ORDER — ENOXAPARIN SODIUM 40 MG/0.4ML ~~LOC~~ SOLN
40.0000 mg | Freq: Every day | SUBCUTANEOUS | Status: DC
  Administered 2016-01-28 – 2016-02-01 (×5): 40 mg via SUBCUTANEOUS
  Filled 2016-01-28 (×5): qty 0.4

## 2016-01-28 MED ORDER — TRAMADOL HCL 50 MG PO TABS
50.0000 mg | ORAL_TABLET | Freq: Four times a day (QID) | ORAL | Status: DC | PRN
  Administered 2016-01-28 – 2016-01-31 (×7): 100 mg via ORAL
  Filled 2016-01-28 (×8): qty 2

## 2016-01-28 NOTE — Progress Notes (Signed)
5 Days Post-Op  Subjective: Patient making slow progress. She still is somewhat distended, very little flatus. She was placed on full liquids yesterday but is not taking very much. She did ambulate some. She still feels rather worn out. Pain control with oxycodone is moderately effective. No BM recently. Apparently she was given Dulcolax, Colace, Senokot and MiraLAX postop day 2 with some results. Currently she is receiving tramadol and Tylenol, not oxycodone for pain at a very low dose. Fluid in the JP drain is clear. Objective: Vital signs in last 24 hours: Temp:  [97.5 F (36.4 C)-98.7 F (37.1 C)] 97.5 F (36.4 C) (11/15 0555) Pulse Rate:  [68-79] 78 (11/15 0555) Resp:  [18] 18 (11/14 2122) BP: (135-165)/(60-66) 152/60 (11/15 0555) SpO2:  [95 %-96 %] 95 % (11/15 0555) Last BM Date: 01/25/16 440 PO 1316 IV Voided x 5 Drain 15 Afebrile, VSS No labs today  Intake/Output from previous day: 11/14 0701 - 11/15 0700 In: 1756.8 [P.O.:440; I.V.:1316.8] Out: 15 [Drains:15] Intake/Output this shift: No intake/output data recorded.  General appearance: alert, cooperative and no distress Resp: clear to auscultation bilaterally GI: Still mildly distended., Soft, positive bowel sounds some flatus no BM drainage is clear, port sites all look good.  Lab Results:   Recent Labs  01/26/16 0244 01/27/16 0205  WBC 14.4* 10.6*  HGB 10.9* 11.5*  HCT 33.5* 35.0*  PLT 286 320    BMET  Recent Labs  01/26/16 0244 01/27/16 0205  NA 137 137  K 3.1* 3.5  CL 103 103  CO2 26 26  GLUCOSE 105* 107*  BUN <5* <5*  CREATININE 0.54 0.48  CALCIUM 8.1* 8.3*   PT/INR No results for input(s): LABPROT, INR in the last 72 hours.   Recent Labs Lab 01/23/16 1121  AST 21  ALT 14  ALKPHOS 71  BILITOT 0.4  PROT 7.1  ALBUMIN 3.5     Lipase     Component Value Date/Time   LIPASE 20 01/23/2016 1121     Studies/Results: No results found.  Medications: . acetaminophen  500 mg Oral  Q6H  . amitriptyline  50 mg Oral QHS  . amLODipine  5 mg Oral Daily  . atenolol  50 mg Oral Daily  . atorvastatin  20 mg Oral Daily  . cholecalciferol  1,000 Units Oral Daily  . colchicine  0.6 mg Oral Daily  . docusate sodium  100 mg Oral BID  . ezetimibe  10 mg Oral Daily  . feeding supplement  1 Container Oral TID BM  . loratadine  10 mg Oral Daily  . mometasone-formoterol  2 puff Inhalation BID  . multivitamin  1 tablet Oral Daily  . piperacillin-tazobactam (ZOSYN)  IV  3.375 g Intravenous Q8H  . polyethylene glycol  17 g Oral Daily  . senna  1 tablet Oral BID   . 0.9 % NaCl with KCl 40 mEq / L 75 mL/hr (01/28/16 0527)     Carotid stenosis Vasculitis - Rx at [facility] COPD/tobcco use 63 years Hypertension Hyperlipidemia Macular degeneration - dry Vasculitis - restart colchicine home dose   Assessment/Plan Acute appendicitis with perforation  S/p Laparoscopic Appendectomy 01/23/16 Dr. Kae Heller POD 5 ID - zosyn 11/10>> day 6 FEN - full liquids continue today  VTE - SCDs - add Lovenox  Plan: Continue her on full liquids. I am going to hold the laxatives. Increase mobilization. Increase tramadol continue current IV fluids. Continue antibiotics.        LOS: 5 days  Francys Bolin 01/28/2016 [phone]

## 2016-01-29 ENCOUNTER — Inpatient Hospital Stay (HOSPITAL_COMMUNITY): Payer: Medicare Other

## 2016-01-29 LAB — BASIC METABOLIC PANEL
ANION GAP: 9 (ref 5–15)
BUN: 6 mg/dL (ref 6–20)
CO2: 23 mmol/L (ref 22–32)
Calcium: 8.6 mg/dL — ABNORMAL LOW (ref 8.9–10.3)
Chloride: 103 mmol/L (ref 101–111)
Creatinine, Ser: 0.51 mg/dL (ref 0.44–1.00)
GFR calc Af Amer: >60 mL/min (ref >60)
GFR calc non Af Amer: >60 mL/min (ref >60)
GLUCOSE: 108 mg/dL — AB (ref 65–99)
POTASSIUM: 4 mmol/L (ref 3.5–5.1)
Sodium: 135 mmol/L (ref 135–145)

## 2016-01-29 LAB — CBC
HEMATOCRIT: 36.6 % (ref 36.0–46.0)
Hemoglobin: 12.3 g/dL (ref 12.0–15.0)
MCH: 29.4 pg (ref 26.0–34.0)
MCHC: 33.6 g/dL (ref 30.0–36.0)
MCV: 87.6 fL (ref 78.0–100.0)
Platelets: 332 10*3/uL (ref 150–400)
RBC: 4.18 MIL/uL (ref 3.87–5.11)
RDW: 14.7 % (ref 11.5–15.5)
WBC: 12.5 10*3/uL — AB (ref 4.0–10.5)

## 2016-01-29 MED ORDER — KCL IN DEXTROSE-NACL 20-5-0.9 MEQ/L-%-% IV SOLN
INTRAVENOUS | Status: DC
  Administered 2016-01-29 – 2016-01-31 (×2): via INTRAVENOUS
  Filled 2016-01-29 (×5): qty 1000

## 2016-01-29 MED ORDER — PRO-STAT SUGAR FREE PO LIQD
30.0000 mL | Freq: Two times a day (BID) | ORAL | Status: DC
  Administered 2016-01-30 – 2016-01-31 (×2): 30 mL via ORAL
  Filled 2016-01-29 (×6): qty 30

## 2016-01-29 MED ORDER — BOOST / RESOURCE BREEZE PO LIQD
1.0000 | ORAL | Status: DC
  Administered 2016-01-30 – 2016-01-31 (×2): 1 via ORAL

## 2016-01-29 MED ORDER — IOPAMIDOL (ISOVUE-300) INJECTION 61%
100.0000 mL | Freq: Once | INTRAVENOUS | Status: AC | PRN
  Administered 2016-01-29: 100 mL via INTRAVENOUS

## 2016-01-29 MED ORDER — BOOST / RESOURCE BREEZE PO LIQD: 1.0000 | Freq: Three times a day (TID) | ORAL | Status: DC

## 2016-01-29 NOTE — Progress Notes (Signed)
6 Days Post-Op  Subjective: He is not progressing at all, ongoing abdominal discomfort. She does not want to eat or drink. She is having some flatus and had a bowel movement.  Objective: Vital signs in last 24 hours: Temp:  [97.5 F (36.4 C)-98.3 F (36.8 C)] 98.3 F (36.8 C) (11/16 0500) Pulse Rate:  [72-91] 91 (11/16 0500) Resp:  [18-20] 18 (11/16 0500) BP: (149-179)/(74-80) 165/74 (11/16 0500) SpO2:  [93 %-95 %] 94 % (11/16 0500) Last BM Date: 01/28/16 120 PO recorded Drain 15 ml Voided x 3 BM x 1 Afebrile, VSS WBC 12.5 BMP OK.  K+ up to 4.0 Original CT 01/23/16 Intake/Output from previous day: 11/15 0701 - 11/16 0700 In: 1408.3 [P.O.:120; I.V.:1288.3] Out: 15 [Drains:15] Intake/Output this shift: No intake/output data recorded.  General appearance: alert, cooperative and mild distress Resp: rales right base.   Abdomen;  still somewhat distended, still tender to palpation positive bowel sounds, positive BM. Drainage from the JP is clear. Lab Results:   Recent Labs  01/27/16 0205 01/29/16 0534  WBC 10.6* 12.5*  HGB 11.5* 12.3  HCT 35.0* 36.6  PLT 320 332    BMET  Recent Labs  01/27/16 0205 01/29/16 0534  NA 137 135  K 3.5 4.0  CL 103 103  CO2 26 23  GLUCOSE 107* 108*  BUN <5* 6  CREATININE 0.48 0.51  CALCIUM 8.3* 8.6*   PT/INR No results for input(s): LABPROT, INR in the last 72 hours.   Recent Labs Lab 01/23/16 1121  AST 21  ALT 14  ALKPHOS 71  BILITOT 0.4  PROT 7.1  ALBUMIN 3.5     Lipase     Component Value Date/Time   LIPASE 20 01/23/2016 1121     Studies/Results: No results found.  Medications: . acetaminophen  500 mg Oral Q6H  . amitriptyline  50 mg Oral QHS  . amLODipine  5 mg Oral Daily  . atenolol  50 mg Oral Daily  . atorvastatin  20 mg Oral Daily  . cholecalciferol  1,000 Units Oral Daily  . colchicine  0.6 mg Oral Daily  . docusate sodium  100 mg Oral BID  . enoxaparin (LOVENOX) injection  40 mg Subcutaneous  Daily  . ezetimibe  10 mg Oral Daily  . feeding supplement  1 Container Oral TID BM  . feeding supplement (ENSURE ENLIVE)  237 mL Oral BID BM  . loratadine  10 mg Oral Daily  . mometasone-formoterol  2 puff Inhalation BID  . multivitamin  1 tablet Oral Daily  . piperacillin-tazobactam (ZOSYN)  IV  3.375 g Intravenous Q8H   . 0.9 % NaCl with KCl 40 mEq / L 50 mL/hr (01/29/16 0036)    Assessment/Plan Acute appendicitis with perforation  S/p Laparoscopic Appendectomy11/10/17 Dr. Kae Heller POD 6 ID - zosyn 11/10>> day 7 FEN - full liquids continue today => back to clears VTE - SCDs - Lovenox   Plan: Repeat CT scan this a.m. along with a chest x-ray. Continue current antibiotics. Clear liquids, and increase IV fluids.      LOS: 6 days    Darryll Raju 01/29/2016 [phone]

## 2016-01-29 NOTE — Progress Notes (Signed)
Initial Nutrition Assessment  DOCUMENTATION CODES:   Not applicable  INTERVENTION:   -D/c Ensure Enlive po BID, each supplement provides 350 kcal and 20 grams of protein -Boost Breeze po daily, each supplement provides 250 kcal and 9 grams of protein -30 ml Prostat BID, each supplement 100 kcals and 15 grams protein   NUTRITION DIAGNOSIS:   Inadequate oral intake related to altered GI function as evidenced by meal completion < 25%.  GOAL:   Patient will meet greater than or equal to 90% of their needs  MONITOR:   PO intake, Supplement acceptance, Labs, Weight trends, Diet advancement, Skin, I & O's  REASON FOR ASSESSMENT:   NPO/Clear Liquid Diet    ASSESSMENT:    pt presents with the abdominal pain that started last PM after eating [employer].  She has been nauseated but has not vomited, some chills last pM. .  She presents now with very severe pain, nausea and constipation.  S/p Procedure performed: Laparoscopic Appendectomy 01/23/16  Spoke with pt at bedside, who reports very minimal intake of liquids related to stomach pain. She states "I just can't each much" due to pain. Pt has mainly consumed clear liquids and full liquids; meal completion 10-25%. Pt states that she think she can tolerate clear liquids better today.   Pt denies any weight loss. 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   Nutrition-Focused physical exam completed. Findings are mild fat depletion, mild muscle depletion, and no edema. Suspect depletion is related to advanced age.   Pt reports she consumed an entire Ensure supplement yesterday. She has tried the [employer], but states "it's too sweet". Due to increased nutritional density, pt amenable to try Prostat supplements, which she states she has had in the past.Discussed possibility of chasing Prostat supplement with juice or soda to  improve acceptance. Educated pt on importance of good PO intake to assist with healing.   Case discussed with RN, who reports minimal intake for stomach pain. Plan to repeat CT and CXR today.  Labs reviewed.   Diet Order:  Diet clear liquid Room service appropriate? Yes; Fluid consistency: Thin  Skin:  Reviewed, no issues  Last BM:  01/28/16  Height:   Ht Readings from Last 1 Encounters:  01/23/16 5\' 8"  (1.727 m)    Weight:   Wt Readings from Last 1 Encounters:  01/23/16 166 lb 14.2 oz (75.7 kg)    Ideal Body Weight:  63.6 kg  BMI:  Body mass index is 25.38 kg/m.  Estimated Nutritional Needs:   Kcal:  1600-1800  Protein:  85-95 grams  Fluid:  1.6-1.8 L  EDUCATION NEEDS:   Education needs addressed  Kendria Halberg A. Jimmye Norman, RD, LDN, CDE Pager: [phone] After hours Pager: [phone]

## 2016-01-30 ENCOUNTER — Ambulatory Visit: Payer: Medicare Other | Admitting: Vascular Surgery

## 2016-01-30 ENCOUNTER — Encounter (HOSPITAL_COMMUNITY): Payer: Medicare Other

## 2016-01-30 LAB — BASIC METABOLIC PANEL
ANION GAP: 10 (ref 5–15)
BUN: 6 mg/dL (ref 6–20)
CALCIUM: 8.7 mg/dL — AB (ref 8.9–10.3)
CO2: 23 mmol/L (ref 22–32)
CREATININE: 0.53 mg/dL (ref 0.44–1.00)
Chloride: 102 mmol/L (ref 101–111)
Glucose, Bld: 107 mg/dL — ABNORMAL HIGH (ref 65–99)
Potassium: 3.8 mmol/L (ref 3.5–5.1)
SODIUM: 135 mmol/L (ref 135–145)

## 2016-01-30 LAB — CBC
HEMATOCRIT: 37.4 % (ref 36.0–46.0)
Hemoglobin: 12.4 g/dL (ref 12.0–15.0)
MCH: 29.2 pg (ref 26.0–34.0)
MCHC: 33.2 g/dL (ref 30.0–36.0)
MCV: 88 fL (ref 78.0–100.0)
Platelets: 392 10*3/uL (ref 150–400)
RBC: 4.25 MIL/uL (ref 3.87–5.11)
RDW: 14.8 % (ref 11.5–15.5)
WBC: 12.6 10*3/uL — AB (ref 4.0–10.5)

## 2016-01-30 MED ORDER — WHITE PETROLATUM GEL
Status: AC
  Administered 2016-01-30: 13:00:00
  Filled 2016-01-30: qty 1

## 2016-01-30 NOTE — Progress Notes (Signed)
7 Days Post-Op  Subjective: She looks better than yesterday.  Sites are fine and doing better.    Objective: Vital signs in last 24 hours: Temp:  [97.7 F (36.5 C)-98.5 F (36.9 C)] 98.5 F (36.9 C) (11/17 0617) Pulse Rate:  [71-79] 74 (11/17 0617) Resp:  [16-17] 16 (11/17 0617) BP: (153-166)/(67-70) 166/69 (11/17 0617) SpO2:  [94 %-96 %] 96 % (11/17 0820) FiO2 (%):  [21 %] 21 % (11/17 0820) Last BM Date: 01/28/16 466 PO IV ? Drain 10 ml Afebrile, VSS WBC still 12.6 CXR yesterday shows:  Increased bronchitic changes. Focal right midlung opacity may reflect atelectasis. CT scan yesterday shows: There are postsurgical changes in the right lower quadrant after appendectomy. A single surgical drain is also noted from left-sided approach terminating in the right lower quadrant/upper pelvis. Omental fatty induration is seen. There are mildly dilated fluid-filled small bowel loops in the mid abdomen which may represent a mild ileus. Trace minimal enhancing fluid in the right lower quadrant cannot exclude a tiny developing abscess. Clinical correlation and follow-up if needed.  Intake/Output from previous day: 11/16 0701 - 11/17 0700 In: 516 [P.O.:466; IV Piggyback:50] Out: 1360 [Urine:1350; Drains:10] Intake/Output this shift: No intake/output data recorded.  General appearance: alert, cooperative and no distress Resp: few rales on right GI: soft, less distended, sites OK plan to pull drain today.  Lab Results:   Recent Labs  01/29/16 0534 01/30/16 0403  WBC 12.5* 12.6*  HGB 12.3 12.4  HCT 36.6 37.4  PLT 332 392    BMET  Recent Labs  01/29/16 0534 01/30/16 0403  NA 135 135  K 4.0 3.8  CL 103 102  CO2 23 23  GLUCOSE 108* 107*  BUN 6 6  CREATININE 0.51 0.53  CALCIUM 8.6* 8.7*   PT/INR No results for input(s): LABPROT, INR in the last 72 hours.   Recent Labs Lab 01/23/16 1121  AST 21  ALT 14  ALKPHOS 71  BILITOT 0.4  PROT 7.1  ALBUMIN 3.5      Lipase     Component Value Date/Time   LIPASE 20 01/23/2016 1121     Studies/Results: Dg Chest 2 View  Result Date: 01/29/2016 CLINICAL DATA:  Abdominal pain.  Posterior chest rales on the right. EXAM: CHEST  2 VIEW COMPARISON:  Chest CT 06/01/2013.  Chest radiographs 07/24/2012. FINDINGS: The cardiac silhouette is normal in size. Aortic atherosclerosis is again seen. The lungs remain hyperinflated with increased airway thickening and interstitial coarsening bilaterally. There is a small focus of knee opacity in the right midlung which is likely in the upper lobe along the minor fissure. Mild scarring is again seen in the lung bases. No pleural effusion or pneumothorax is identified. No acute osseous abnormality is seen. IMPRESSION: Increased bronchitic changes. Focal right midlung opacity may reflect atelectasis. Electronically Signed   By: Logan Bores M.D.   On: 01/29/2016 12:45   Ct Abdomen Pelvis W Contrast  Result Date: 01/29/2016 CLINICAL DATA:  Patient presents with abdominal pain that started last evening after eating. EXAM: CT ABDOMEN AND PELVIS WITH CONTRAST TECHNIQUE: Multidetector CT imaging of the abdomen and pelvis was performed using the standard protocol following bolus administration of intravenous contrast. CONTRAST:  141mL ISOVUE-300 IOPAMIDOL (ISOVUE-300) INJECTION 61% COMPARISON:  01/23/2016, 06/22/2011 CT. FINDINGS: Lower chest: No small bowel dilatation. Hepatobiliary: There is hepatic steatosis without biliary dilatation. Stable 4 mm hypodensities in the left hepatic lobe too small to further characterize but statistically consistent with cysts and/or hemangiomata.  The gallbladder is moderately distended without wall thickening, calculi nor pericholecystic fluid. This may represent a fasting state. Pancreas: Unremarkable. No pancreatic ductal dilatation or surrounding inflammatory changes. Spleen: Normal in size without focal abnormality. Adrenals/Urinary Tract: Bilateral  nodular thickened appearance of the adrenal glands left-greater-than-right with stable 10 mm nodule in the right thyroid gland. On the left there are several confluent appearing nodules as before. These are stable dating back to 2013. Stomach/Bowel: The stomach is mild-to-moderately distended with fluid. No gastric outlet obstruction. There are a few mildly dilated small bowel loops the mid abdomen. There are post surgical omental fatty changes in the right lower quadrant status post appendectomy with single surgical drain noted, tip in the right lower quadrant. No large bowel dilatation or obstruction. Vascular/Lymphatic: Aortoiliac and branch vessel atherosclerosis without dissection nor aneurysm. No lymphadenopathy. Reproductive: Status post hysterectomy. No adnexal masses. Other: Small amount of free fluid in the pelvis. Trace enhancing fluid in the right lower quadrant overlying the right common iliac artery is noted, series 2, image 57, approximately 9 x 17 mm. Musculoskeletal: Postsurgical induration with tiny foci subcutaneous emphysema changes along the lower abdomen bilaterally. No acute osseous abnormality. IMPRESSION: There are postsurgical changes in the right lower quadrant after appendectomy. A single surgical drain is also noted from left-sided approach terminating in the right lower quadrant/upper pelvis. Omental fatty induration is seen. There are mildly dilated fluid-filled small bowel loops in the mid abdomen which may represent a mild ileus. Trace minimal enhancing fluid in the right lower quadrant cannot exclude a tiny developing abscess. Clinical correlation and follow-up if needed. Electronically Signed   By: Ashley Royalty M.D.   On: 01/29/2016 18:17   Prior to Admission medications   Medication Sig Start Date End Date Taking? Authorizing Provider  albuterol (PROVENTIL HFA;VENTOLIN HFA) 108 (90 Base) MCG/ACT inhaler Inhale 1 puff into the lungs every 6 (six) hours as needed for wheezing or  shortness of breath.   Yes Historical Provider, MD  amitriptyline (ELAVIL) 100 MG tablet Take 50 mg by mouth at bedtime.    Yes Historical Provider, MD  amLODipine (NORVASC) 5 MG tablet Take 5 mg by mouth daily.    Yes Historical Provider, MD  aspirin 81 MG EC tablet Take 81 mg by mouth daily.    Yes Historical Provider, MD  atenolol (TENORMIN) 50 MG tablet Take 50 mg by mouth daily.   Yes Historical Provider, MD  atorvastatin (LIPITOR) 40 MG tablet Take 20 mg by mouth daily.    Yes Historical Provider, MD  budesonide-formoterol (SYMBICORT) 160-4.5 MCG/ACT inhaler Inhale 2 puffs into the lungs 2 (two) times daily as needed.   Yes Historical Provider, MD  Cholecalciferol (VITAMIN D3) 2000 UNITS TABS Take 1 tablet by mouth daily.   Yes Historical Provider, MD  colchicine 0.6 MG tablet Take 0.6 mg by mouth daily.    Yes Historical Provider, MD  Cyanocobalamin (VITAMIN B-12 PO) Take by mouth. 3000 iu daily   Yes Historical Provider, MD  EPINEPHrine 0.3 mg/0.3 mL IJ SOAJ injection Inject 0.3 mLs into the muscle once as needed. Allergic reaction   Yes Historical Provider, MD  ezetimibe (ZETIA) 10 MG tablet Take 10 mg by mouth daily.   Yes Historical Provider, MD  levocetirizine (XYZAL) 5 MG tablet Take 5 mg by mouth daily. 01/03/16  Yes Historical Provider, MD  Multiple Vitamins-Minerals (PRESERVISION AREDS 2) CAPS Take 2 tablets by mouth 2 (two) times daily.   Yes Historical Provider, MD  Medications: . acetaminophen  500 mg Oral Q6H  . amitriptyline  50 mg Oral QHS  . amLODipine  5 mg Oral Daily  . atenolol  50 mg Oral Daily  . atorvastatin  20 mg Oral Daily  . cholecalciferol  1,000 Units Oral Daily  . colchicine  0.6 mg Oral Daily  . docusate sodium  100 mg Oral BID  . enoxaparin (LOVENOX) injection  40 mg Subcutaneous Daily  . ezetimibe  10 mg Oral Daily  . feeding supplement  1 Container Oral Q24H  . feeding supplement (PRO-STAT SUGAR FREE 64)  30 mL Oral BID  . loratadine  10 mg Oral  Daily  . mometasone-formoterol  2 puff Inhalation BID  . multivitamin  1 tablet Oral Daily  . piperacillin-tazobactam (ZOSYN)  IV  3.375 g Intravenous Q8H   . dextrose 5 % and 0.9 % NaCl with KCl 20 mEq/L 100 mL/hr at 01/29/16 1255   Carotid stenosis Vasculitis - Rx at [facility] COPD/tobcco use 63 years Hypertension Hyperlipidemia Macular degeneration - dry Vasculitis - restart colchicine home dose  Assessment/Plan S/p Laparoscopic Appendectomy11/10/17 Dr. Kae Heller POD 7 ID - zosyn 11/10>>day 8 FEN - clears => full liquids today VTE - SCDs - Lovenox   Plan:  Pull drain and advance diet.      LOS: 7 days    Sophronia Varney 01/30/2016 [phone]

## 2016-01-30 NOTE — Discharge Instructions (Signed)
[facility] ______[facility] °LAPAROSCOPIC SURGERY: POST OP INSTRUCTIONS °Always review your discharge instruction sheet given to you by the facility where your surgery was performed. °IF YOU HAVE DISABILITY OR FAMILY LEAVE FORMS, YOU MUST BRING THEM TO THE OFFICE FOR PROCESSING.   °DO NOT GIVE THEM TO YOUR DOCTOR. ° °1. A prescription for pain medication may be given to you upon discharge.  Take your pain medication as prescribed, if needed.  If narcotic pain medicine is not needed, then you may take acetaminophen (Tylenol) or ibuprofen (Advil) as needed. °2. Take your usually prescribed medications unless otherwise directed. °3. If you need a refill on your pain medication, please contact your pharmacy.  They will contact our office to request authorization. Prescriptions will not be filled after 5pm or on week-ends. °4. You should follow a light diet the first few days after arrival home, such as soup and crackers, etc.  Be sure to include lots of fluids daily. °5. Most patients will experience some swelling and bruising in the area of the incisions.  Ice packs will help.  Swelling and bruising can take several days to resolve.  °6. It is common to experience some constipation if taking pain medication after surgery.  Increasing fluid intake and taking a stool softener (such as Colace) will usually help or prevent this problem from occurring.  A mild laxative (Milk of Magnesia or Miralax) should be taken according to package instructions if there are no bowel movements after 48 hours. °7. Unless discharge instructions indicate otherwise, you may remove your bandages 24-48 hours after surgery, and you may shower at that time.  You may have steri-strips (small skin tapes) in place directly over the incision.  These strips should be left on the skin for 7-10 days.  If your surgeon used skin glue on the incision, you may shower in 24 hours.  The glue will flake off over the next 2-3 weeks.  Any sutures or  staples will be removed at the office during your follow-up visit. °8. ACTIVITIES:  You may resume regular (light) daily activities beginning the next day--such as daily self-care, walking, climbing stairs--gradually increasing activities as tolerated.  You may have sexual intercourse when it is comfortable.  Refrain from any heavy lifting or straining until approved by your doctor. °a. You may drive when you are no longer taking prescription pain medication, you can comfortably wear a seatbelt, and you can safely maneuver your car and apply brakes. °b. RETURN TO WORK:  __________________________________________________________ °9. You should see your doctor in the office for a follow-up appointment approximately 2-3 weeks after your surgery.  Make sure that you call for this appointment within a day or two after you arrive home to insure a convenient appointment time. °10. OTHER INSTRUCTIONS: __________________________________________________________________________________________________________________________ __________________________________________________________________________________________________________________________ °WHEN TO CALL YOUR DOCTOR: °1. Fever over 101.0 °2. Inability to urinate °3. Continued bleeding from incision. °4. Increased pain, redness, or drainage from the incision. °5. Increasing abdominal pain ° °The clinic staff is available to answer your questions during regular business hours.  Please don’t hesitate to call and ask to speak to one of the nurses for clinical concerns.  If you have a medical emergency, go to the nearest emergency room or call 911.  A surgeon from [facility] is always on call at the hospital. °[address], [address], [place], [place]  [address] ? [address] [address], [place], [place]   [address] °[phone] ? [phone] ? FAX [phone] °Web site:   [email or web address] ° ° °Laparoscopic Appendectomy, Adult, Care After °Refer to  this sheet in the next few weeks. These instructions provide you with information about caring for yourself after your procedure. Your health care provider may also give you more specific instructions. Your treatment has been planned according to current medical practices, but problems sometimes occur. Call your health care provider if you have any problems or questions after your procedure. °What can I expect after the procedure? °After the procedure, it is common to have: °· A decrease in your energy level. °· Mild pain in the area where the surgical cuts (incisions) were made. °· Constipation. This can be caused by pain medicine and a decrease in your activity. °Follow these instructions at home: °Medicines  °· Take over-the-counter and prescription medicines only as told by your health care provider. °· Do not drive for 24 hours if you received a sedative. °· Do not drive or operate heavy machinery while taking prescription pain medicine. °· If you were prescribed an antibiotic medicine, take it as told by your health care provider. Do not stop taking the antibiotic even if you start to feel better. °Activity  °· For 3 weeks or as long as told by your health care provider: °¨ Do not lift anything that is heavier than 10 pounds (4.5 kg). °¨ Do not play contact sports. °· Gradually return to your normal activities. Ask your health care provider what activities are safe for you. °Bathing  °· Keep your incisions clean and dry. Clean them as often as told by your health care provider: °¨ Gently wash the incisions with soap and water. °¨ Rinse the incisions with water to remove all soap. °¨ Pat the incisions dry with a clean towel. Do not rub the incisions. °· You may take showers after 48 hours. °· Do not take baths, swim, or use hot tubs for 2 weeks or as told by your health care provider. °Incision care  °· Follow instructions from your healthcare provider about how to take care of your incisions. Make sure  you: °¨ Wash your hands with soap and water before you change your bandage (dressing). If soap and water are not available, use hand sanitizer. °¨ Change your dressing as told by your health care provider. °¨ Leave stitches (sutures), skin glue, or adhesive strips in place. These skin closures may need to stay in place for 2 weeks or longer. If adhesive strip edges start to loosen and curl up, you may trim the loose edges. Do not remove adhesive strips completely unless your health care provider tells you to do that. °· Check your incision areas every day for signs of infection. Check for: °¨ More redness, swelling, or pain. °¨ More fluid or blood. °¨ Warmth. °¨ Pus or a bad smell. °Other Instructions  °· If you were sent home with a drain, follow instructions from your health care provider about how to care for the drain and how to empty it. °· Take deep breaths. This helps to prevent your lungs from becoming inflamed. °· To relieve and prevent constipation: °¨ Drink plenty of fluids. °¨ Eat plenty of fruits and vegetables. °· Keep all follow-up visits as told by your health care provider. This is important. °Contact a health care provider if: °· You have more redness, swelling, or pain around an incision. °· You have more fluid or blood coming from an incision. °· Your incision feels warm to the touch. °· You have pus or a bad smell coming   from an incision or dressing. °· Your incision edges break open after your sutures have been removed. °· You have increasing pain in your shoulders. °· You feel dizzy or you faint. °· You develop shortness of breath. °· You keep feeling nauseous or vomiting. °· You have diarrhea or you cannot control your bowel functions. °· You lose your appetite. °· You develop swelling or pain in your legs. °Get help right away if: °· You have a fever. °· You develop a rash. °· You have difficulty breathing. °· You have sharp pains in your chest. °This information is not intended to replace  advice given to you by your health care provider. Make sure you discuss any questions you have with your health care provider. °Document Released: 03/01/2005 Document Revised: 08/01/2015 Document Reviewed: 08/19/2014 °Elsevier Interactive Patient Education © 2017 [employer] ° °

## 2016-01-30 NOTE — Care Management Important Message (Signed)
Important Message  Patient Details  Name: Heidi Hobbs MRN: [id number] Date of Birth: 1941-01-20   Medicare Important Message Given:  Yes    Orbie Pyo 01/30/2016, 3:21 PM

## 2016-01-31 MED ORDER — SACCHAROMYCES BOULARDII 250 MG PO CAPS
250.0000 mg | ORAL_CAPSULE | Freq: Two times a day (BID) | ORAL | Status: DC
  Administered 2016-01-31 – 2016-02-01 (×3): 250 mg via ORAL
  Filled 2016-01-31 (×3): qty 1

## 2016-01-31 NOTE — Progress Notes (Signed)
8 Days Post-Op  Subjective: Doing better, still a little distended but had a BM, tolerated full liquids and feels ready to move onto soft diet.  Walking some in halls.   Objective: Vital signs in last 24 hours: Temp:  [97.6 F (36.4 C)-98.2 F (36.8 C)] 97.6 F (36.4 C) (11/18 0528) Pulse Rate:  [67-74] 71 (11/18 0528) Resp:  [18] 18 (11/18 0528) BP: (124-159)/(42-64) 150/59 (11/18 0528) SpO2:  [93 %-96 %] 93 % (11/18 0528) FiO2 (%):  [21 %] 21 % (11/17 0820) Last BM Date: 01/30/16 400 PO recorded IV 600 Urine 450 recorded BM x 2 Afebrile, VSS Labs OK yesterday Intake/Output from previous day: 11/17 0701 - 11/18 0700 In: 1000 [P.O.:400; I.V.:600] Out: 450 [Urine:450] Intake/Output this shift: No intake/output data recorded.  General appearance: alert, cooperative and no distress Resp: clear to auscultation bilaterally GI: soft, still a little distended, + BS, +BM, drain out, sites ok  Lab Results:   Recent Labs  01/29/16 0534 01/30/16 0403  WBC 12.5* 12.6*  HGB 12.3 12.4  HCT 36.6 37.4  PLT 332 392    BMET  Recent Labs  01/29/16 0534 01/30/16 0403  NA 135 135  K 4.0 3.8  CL 103 102  CO2 23 23  GLUCOSE 108* 107*  BUN 6 6  CREATININE 0.51 0.53  CALCIUM 8.6* 8.7*   PT/INR No results for input(s): LABPROT, INR in the last 72 hours.  No results for input(s): AST, ALT, ALKPHOS, BILITOT, PROT, ALBUMIN in the last 168 hours.   Lipase     Component Value Date/Time   LIPASE 20 01/23/2016 1121     Studies/Results: Dg Chest 2 View  Result Date: 01/29/2016 CLINICAL DATA:  Abdominal pain.  Posterior chest rales on the right. EXAM: CHEST  2 VIEW COMPARISON:  Chest CT 06/01/2013.  Chest radiographs 07/24/2012. FINDINGS: The cardiac silhouette is normal in size. Aortic atherosclerosis is again seen. The lungs remain hyperinflated with increased airway thickening and interstitial coarsening bilaterally. There is a small focus of knee opacity in the right  midlung which is likely in the upper lobe along the minor fissure. Mild scarring is again seen in the lung bases. No pleural effusion or pneumothorax is identified. No acute osseous abnormality is seen. IMPRESSION: Increased bronchitic changes. Focal right midlung opacity may reflect atelectasis. Electronically Signed   By: Logan Bores M.D.   On: 01/29/2016 12:45   Ct Abdomen Pelvis W Contrast  Result Date: 01/29/2016 CLINICAL DATA:  Patient presents with abdominal pain that started last evening after eating. EXAM: CT ABDOMEN AND PELVIS WITH CONTRAST TECHNIQUE: Multidetector CT imaging of the abdomen and pelvis was performed using the standard protocol following bolus administration of intravenous contrast. CONTRAST:  143mL ISOVUE-300 IOPAMIDOL (ISOVUE-300) INJECTION 61% COMPARISON:  01/23/2016, 06/22/2011 CT. FINDINGS: Lower chest: No small bowel dilatation. Hepatobiliary: There is hepatic steatosis without biliary dilatation. Stable 4 mm hypodensities in the left hepatic lobe too small to further characterize but statistically consistent with cysts and/or hemangiomata. The gallbladder is moderately distended without wall thickening, calculi nor pericholecystic fluid. This may represent a fasting state. Pancreas: Unremarkable. No pancreatic ductal dilatation or surrounding inflammatory changes. Spleen: Normal in size without focal abnormality. Adrenals/Urinary Tract: Bilateral nodular thickened appearance of the adrenal glands left-greater-than-right with stable 10 mm nodule in the right thyroid gland. On the left there are several confluent appearing nodules as before. These are stable dating back to 2013. Stomach/Bowel: The stomach is mild-to-moderately distended with fluid. No gastric outlet  obstruction. There are a few mildly dilated small bowel loops the mid abdomen. There are post surgical omental fatty changes in the right lower quadrant status post appendectomy with single surgical drain noted, tip in  the right lower quadrant. No large bowel dilatation or obstruction. Vascular/Lymphatic: Aortoiliac and branch vessel atherosclerosis without dissection nor aneurysm. No lymphadenopathy. Reproductive: Status post hysterectomy. No adnexal masses. Other: Small amount of free fluid in the pelvis. Trace enhancing fluid in the right lower quadrant overlying the right common iliac artery is noted, series 2, image 57, approximately 9 x 17 mm. Musculoskeletal: Postsurgical induration with tiny foci subcutaneous emphysema changes along the lower abdomen bilaterally. No acute osseous abnormality. IMPRESSION: There are postsurgical changes in the right lower quadrant after appendectomy. A single surgical drain is also noted from left-sided approach terminating in the right lower quadrant/upper pelvis. Omental fatty induration is seen. There are mildly dilated fluid-filled small bowel loops in the mid abdomen which may represent a mild ileus. Trace minimal enhancing fluid in the right lower quadrant cannot exclude a tiny developing abscess. Clinical correlation and follow-up if needed. Electronically Signed   By: Ashley Royalty M.D.   On: 01/29/2016 18:17    Medications: . acetaminophen  500 mg Oral Q6H  . amitriptyline  50 mg Oral QHS  . amLODipine  5 mg Oral Daily  . atenolol  50 mg Oral Daily  . atorvastatin  20 mg Oral Daily  . cholecalciferol  1,000 Units Oral Daily  . colchicine  0.6 mg Oral Daily  . docusate sodium  100 mg Oral BID  . enoxaparin (LOVENOX) injection  40 mg Subcutaneous Daily  . ezetimibe  10 mg Oral Daily  . feeding supplement  1 Container Oral Q24H  . feeding supplement (PRO-STAT SUGAR FREE 64)  30 mL Oral BID  . loratadine  10 mg Oral Daily  . mometasone-formoterol  2 puff Inhalation BID  . multivitamin  1 tablet Oral Daily  . piperacillin-tazobactam (ZOSYN)  IV  3.375 g Intravenous Q8H   . dextrose 5 % and 0.9 % NaCl with KCl 20 mEq/L 100 mL/hr at 01/31/16 0600     Assessment/Plan S/p Laparoscopic Appendectomy11/10/17 Dr. Kae Heller POD 8 ID - zosyn 11/10>>day 9 FEN - full liquids  => soft diet VTE - SCDs - Lovenox   Plan:  Decrease IV.  Soft diet, recheck labs in AM.  Last WBC still 12.6, ? Convert to Augmentin.  Add probiotic. If doing well home in AM.       LOS: 8 days    Nishtha Raider 01/31/2016 [phone]

## 2016-02-01 LAB — BASIC METABOLIC PANEL
Anion gap: 8 (ref 5–15)
BUN: 7 mg/dL (ref 6–20)
CALCIUM: 8.6 mg/dL — AB (ref 8.9–10.3)
CHLORIDE: 105 mmol/L (ref 101–111)
CO2: 24 mmol/L (ref 22–32)
CREATININE: 0.57 mg/dL (ref 0.44–1.00)
GFR calc non Af Amer: >60 mL/min (ref >60)
Glucose, Bld: 96 mg/dL (ref 65–99)
Potassium: 3.3 mmol/L — ABNORMAL LOW (ref 3.5–5.1)
Sodium: 137 mmol/L (ref 135–145)

## 2016-02-01 LAB — CBC
HEMATOCRIT: 35.6 % — AB (ref 36.0–46.0)
Hemoglobin: 11.6 g/dL — ABNORMAL LOW (ref 12.0–15.0)
MCH: 28.7 pg (ref 26.0–34.0)
MCHC: 32.6 g/dL (ref 30.0–36.0)
MCV: 88.1 fL (ref 78.0–100.0)
Platelets: 413 10*3/uL — ABNORMAL HIGH (ref 150–400)
RBC: 4.04 MIL/uL (ref 3.87–5.11)
RDW: 14.8 % (ref 11.5–15.5)
WBC: 11.1 10*3/uL — ABNORMAL HIGH (ref 4.0–10.5)

## 2016-02-01 MED ORDER — OXYCODONE-ACETAMINOPHEN 5-325 MG PO TABS
1.0000 | ORAL_TABLET | ORAL | Status: DC | PRN
  Administered 2016-02-01 (×2): 1 via ORAL
  Filled 2016-02-01 (×2): qty 1

## 2016-02-01 MED ORDER — POTASSIUM CHLORIDE CRYS ER 20 MEQ PO TBCR: 20.0000 meq | EXTENDED_RELEASE_TABLET | Freq: Two times a day (BID) | ORAL | Status: DC

## 2016-02-01 MED ORDER — ACETAMINOPHEN 325 MG PO TABS: 650.0000 mg | ORAL_TABLET | ORAL | Status: DC | PRN

## 2016-02-01 MED ORDER — TRAMADOL HCL 50 MG PO TABS: 50.0000 mg | ORAL_TABLET | Freq: Four times a day (QID) | ORAL | 0 refills | Status: DC | PRN

## 2016-02-01 MED ORDER — SACCHAROMYCES BOULARDII 250 MG PO CAPS: ORAL_CAPSULE | ORAL | Status: DC

## 2016-02-01 MED ORDER — OXYCODONE-ACETAMINOPHEN 5-325 MG PO TABS: 1.0000 | ORAL_TABLET | ORAL | 0 refills | Status: DC | PRN

## 2016-02-01 MED ORDER — DOCUSATE SODIUM 100 MG PO CAPS: ORAL_CAPSULE | ORAL | 0 refills | Status: DC

## 2016-02-01 NOTE — Progress Notes (Signed)
Pt discharged home in stable condition. 3 in 1 chair and rolling walker supplied. AVS and discharge script also given pre discharge. Discharge instructions provided with no concerns voiced

## 2016-02-01 NOTE — Progress Notes (Signed)
Pt will need a 3in 1 chair and rolling walker at discharge. Also they are asking to see if the equipment can be delivered at home.left message for case manager, am reachable on ext [phone] if any questions present

## 2016-02-01 NOTE — Progress Notes (Signed)
9 Days Post-Op  Subjective: Having a headache, loose stools x 2 yesterday, couldn't eat much yesterday.  Clearly does not feel as good this AM as she did yesterday.    Objective: Vital signs in last 24 hours: Temp:  [97.9 F (36.6 C)-98.6 F (37 C)] 97.9 F (36.6 C) (11/19 0524) Pulse Rate:  [72-78] 78 (11/19 0524) Resp:  [17-18] 18 (11/19 0524) BP: (148-161)/(63-66) 161/66 (11/19 0524) SpO2:  [93 %-96 %] 96 % (11/19 0524) FiO2 (%):  [21 %] 21 % (11/18 0856) Last BM Date: 01/30/16   240 PO recorded 471 IV recorded Urine 1800 Afebrile, VSS Labs stable, WBC slightly better Intake/Output from previous day: 11/18 0701 - 11/19 0700 In: 961.7 [P.O.:240; I.V.:471.7; IV Piggyback:250] Out: 1800 [Urine:1800] Intake/Output this shift: No intake/output data recorded.  General appearance: alert, cooperative, no distress and headache with wash cloth on her forehead. Resp: clear, few rales at the bases. GI: soft, less distended, few BS, having some loose stools.    Lab Results:   Recent Labs  01/30/16 0403 02/01/16 0437  WBC 12.6* 11.1*  HGB 12.4 11.6*  HCT 37.4 35.6*  PLT 392 413*    BMET  Recent Labs  01/30/16 0403 02/01/16 0437  NA 135 137  K 3.8 3.3*  CL 102 105  CO2 23 24  GLUCOSE 107* 96  BUN 6 7  CREATININE 0.53 0.57  CALCIUM 8.7* 8.6*   PT/INR No results for input(s): LABPROT, INR in the last 72 hours.  No results for input(s): AST, ALT, ALKPHOS, BILITOT, PROT, ALBUMIN in the last 168 hours.   Lipase     Component Value Date/Time   LIPASE 20 01/23/2016 1121     Studies/Results: No results found.  Medications: . acetaminophen  500 mg Oral Q6H  . amitriptyline  50 mg Oral QHS  . amLODipine  5 mg Oral Daily  . atenolol  50 mg Oral Daily  . atorvastatin  20 mg Oral Daily  . cholecalciferol  1,000 Units Oral Daily  . colchicine  0.6 mg Oral Daily  . docusate sodium  100 mg Oral BID  . enoxaparin (LOVENOX) injection  40 mg Subcutaneous Daily  .  ezetimibe  10 mg Oral Daily  . feeding supplement  1 Container Oral Q24H  . feeding supplement (PRO-STAT SUGAR FREE 64)  30 mL Oral BID  . loratadine  10 mg Oral Daily  . mometasone-formoterol  2 puff Inhalation BID  . multivitamin  1 tablet Oral Daily  . piperacillin-tazobactam (ZOSYN)  IV  3.375 g Intravenous Q8H  . saccharomyces boulardii  250 mg Oral BID    Assessment/Plan S/p Laparoscopic Appendectomy11/10/17 Dr. Kae Heller POD 8 ID - zosyn 11/10>>day 10 FEN - full liquids  => soft diet VTE - SCDs - Lovenox  Plan:  I am going to hold Zosyn I told her to try something stronger than Tylenol for headache, see how she progresses over th AM.  Will discuss d/c later this AM. I will give her some PO KCl and allow her some Percocet for pain/headache.       LOS: 9 days    Heidi Hobbs 02/01/2016 [phone]

## 2016-02-01 NOTE — Progress Notes (Signed)
CM received call from RN requesting DMe.  CM notified 3n1 and rolling walker to be delivered to room prior to discharge.  No other CM needs were communicated.

## 2016-02-09 DIAGNOSIS — J449 Chronic obstructive pulmonary disease, unspecified: Secondary | ICD-10-CM | POA: Diagnosis not present

## 2016-02-09 DIAGNOSIS — Z9049 Acquired absence of other specified parts of digestive tract: Secondary | ICD-10-CM | POA: Diagnosis not present

## 2016-02-09 DIAGNOSIS — M1990 Unspecified osteoarthritis, unspecified site: Secondary | ICD-10-CM | POA: Diagnosis not present

## 2016-02-09 DIAGNOSIS — I6521 Occlusion and stenosis of right carotid artery: Secondary | ICD-10-CM | POA: Diagnosis not present

## 2016-02-09 DIAGNOSIS — Z6823 Body mass index (BMI) 23.0-23.9, adult: Secondary | ICD-10-CM | POA: Diagnosis not present

## 2016-02-09 DIAGNOSIS — I10 Essential (primary) hypertension: Secondary | ICD-10-CM | POA: Diagnosis not present

## 2016-02-09 DIAGNOSIS — K5909 Other constipation: Secondary | ICD-10-CM | POA: Diagnosis not present

## 2016-02-09 DIAGNOSIS — A419 Sepsis, unspecified organism: Secondary | ICD-10-CM | POA: Diagnosis not present

## 2016-02-09 DIAGNOSIS — D519 Vitamin B12 deficiency anemia, unspecified: Secondary | ICD-10-CM | POA: Diagnosis not present

## 2016-02-11 ENCOUNTER — Encounter: Payer: Self-pay | Admitting: Vascular Surgery

## 2016-02-12 NOTE — Progress Notes (Signed)
Established Carotid Patient  History of Present Illness  Heidi Hobbs is a 75 y.o. (1940/04/24) female who presents with chief complaint: abdominal pain.  Pt was recently admitted to the hospital with suppurative appendicitis and required lap appy with intraabd drain placement.   Previous carotid studies demonstrated: RICA 123456 stenosis, LICA 123456 stenosis.  Prior CTA in 2012 demonstrated significant arch disease.  Patient has no history of TIA or stroke symptom.  The patient has never had amaurosis fugax or monocular blindness.  This patient has had diplopia since she got polio as a child.  The patient has known L eye macular degeneration.  The patient has never had facial drooping or hemiplegia.  The patient has never had receptive or expressive aphasia.   The patient notes some dizziness with turning her head to the right.   She denies any other vertebrobasilar sx.  The patient's PMH, PSH, SH, and FamHx are unchanged from 07/31/15.  Current Outpatient Prescriptions  Medication Sig Dispense Refill  . acetaminophen (TYLENOL) 325 MG tablet Take 2 tablets (650 mg total) by mouth every 4 (four) hours as needed for mild pain, moderate pain, fever or headache.    . albuterol (PROVENTIL HFA;VENTOLIN HFA) 108 (90 Base) MCG/ACT inhaler Inhale 1 puff into the lungs every 6 (six) hours as needed for wheezing or shortness of breath.    Marland Kitchen amitriptyline (ELAVIL) 100 MG tablet Take 50 mg by mouth at bedtime.     Marland Kitchen amLODipine (NORVASC) 5 MG tablet Take 5 mg by mouth daily.     Marland Kitchen aspirin 81 MG EC tablet Take 81 mg by mouth daily.     Marland Kitchen atenolol (TENORMIN) 50 MG tablet Take 50 mg by mouth daily.    Marland Kitchen atorvastatin (LIPITOR) 40 MG tablet Take 20 mg by mouth daily.     . budesonide-formoterol (SYMBICORT) 160-4.5 MCG/ACT inhaler Inhale 2 puffs into the lungs 2 (two) times daily as needed.    . Cholecalciferol (VITAMIN D3) 2000 UNITS TABS Take 1 tablet by mouth daily.    . colchicine 0.6 MG tablet Take 0.6  mg by mouth daily.     . Cyanocobalamin (VITAMIN B-12 PO) Take by mouth. 3000 iu daily    . docusate sodium (COLACE) 100 MG capsule You can buy this at any drug store, over the counter and follow package instructions 10 capsule 0  . EPINEPHrine 0.3 mg/0.3 mL IJ SOAJ injection Inject 0.3 mLs into the muscle once as needed. Allergic reaction    . ezetimibe (ZETIA) 10 MG tablet Take 10 mg by mouth daily.    Marland Kitchen levocetirizine (XYZAL) 5 MG tablet Take 5 mg by mouth daily.  5  . Multiple Vitamins-Minerals (PRESERVISION AREDS 2) CAPS Take 2 tablets by mouth 2 (two) times daily.    Marland Kitchen oxyCODONE-acetaminophen (PERCOCET/ROXICET) 5-325 MG tablet Take 1-2 tablets by mouth every 4 (four) hours as needed for moderate pain. 20 tablet 0  . saccharomyces boulardii (FLORASTOR) 250 MG capsule You can buy this over the counter at any drugstore and use for the next month.    . traMADol (ULTRAM) 50 MG tablet Take 1-2 tablets (50-100 mg total) by mouth every 6 (six) hours as needed for moderate pain or severe pain. 40 tablet 0   No current facility-administered medications for this visit.     Allergies  Allergen Reactions  . Eggs Or Egg-Derived Products     Skin nodules    On ROS today: abd pain, no fever or chills  Physical Examination  Vitals:   02/13/16 1447 02/13/16 1450  BP: 140/73 (!) 142/70  Pulse: 71 72  Resp: 16   Temp: 98.6 F (37 C)   SpO2: 96%   Weight: 152 lb (68.9 kg)   Height: 5\' 8"  (1.727 m)     Body mass index is 23.11 kg/m.  General: Alert, O x 3, WD,Ill appearing  Eyes: PERRLA, EOMI,   Neck: Supple, mid-line trachea,    Pulmonary: Sym exp, good B air movt,CTA B  Cardiac: RRR, Nl S1, S2, no Murmurs, No rubs, No S3,S4  Vascular: Vessel Right Left  Radial Palpable Palpable  Brachial Palpable Palpable  Carotid Palpable, No Bruit Palpable, No Bruit  Aorta Not palpable N/A  Femoral Palpable Palpable  Popliteal Not palpable Not palpable  PT faintly palpable faintly  palpable  DP Palpable Palpable   Gastrointestinal: soft, mild distended, did not exam due to recent surgery  Musculoskeletal: M/S 5/5 throughout  , Extremities without ischemic changes  , No edema present,   Neurologic: CN 2-12 intact, Pain and light touch intact in extremities, Motor exam as listed above   Non-Invasive Vascular Imaging  CAROTID DUPLEX (Date: 02/13/2016):   Distal R CCA > 50%  R ICA stenosis: 1-39%  R VA: patent and antegrade with resistive waveform  L ICA stenosis: 1-39%  L VA: patent and antegrade   Medical Decision Making  Heidi Hobbs is a 75 y.o. female who presents with: h/o aortic arch atherosclerosis, abnormally low diastolic velocities, asx B ICA stenosis <80%, possible vertebrobasilar sx, s/p lap appy for suppurative appendicitis   At point, pt's priority is recovery from her recent surgery.  Based on the patient's vascular studies and examination, I have offered the patient: CTA Neck in 3 months..  I discussed in depth with the patient the nature of atherosclerosis, and emphasized the importance of maximal medical management including strict control of blood pressure, blood glucose, and lipid levels, antiplatelet agents, obtaining regular exercise, and cessation of smoking.    The patient is aware that without maximal medical management the underlying atherosclerotic disease process will progress, limiting the benefit of any interventions. The patient is currently on a statin:  Lipitor. The patient is currently on an anti-platelet: ASA.  Thank you for allowing [place] to participate in this patient's care.   Adele Barthel, MD, FACS [facility] Office: [phone] Pager: [phone]

## 2016-02-13 ENCOUNTER — Encounter (HOSPITAL_COMMUNITY): Payer: Self-pay | Admitting: General Surgery

## 2016-02-13 ENCOUNTER — Ambulatory Visit (INDEPENDENT_AMBULATORY_CARE_PROVIDER_SITE_OTHER): Payer: Medicare Other | Admitting: Vascular Surgery

## 2016-02-13 ENCOUNTER — Ambulatory Visit (HOSPITAL_COMMUNITY)
Admission: RE | Admit: 2016-02-13 | Discharge: 2016-02-13 | Disposition: A | Discharge status: Home / Self Care | Payer: Medicare Other | Source: Ambulatory Visit | Attending: Vascular Surgery | Admitting: Vascular Surgery

## 2016-02-13 ENCOUNTER — Encounter: Payer: Self-pay | Admitting: Vascular Surgery

## 2016-02-13 VITALS — BP 142/70 | HR 72 | Temp 98.6°F | Resp 16 | Ht 68.0 in | Wt 152.0 lb

## 2016-02-13 DIAGNOSIS — I739 Peripheral vascular disease, unspecified: Secondary | ICD-10-CM

## 2016-02-13 DIAGNOSIS — I776 Arteritis, unspecified: Secondary | ICD-10-CM

## 2016-02-13 DIAGNOSIS — I779 Disorder of arteries and arterioles, unspecified: Secondary | ICD-10-CM | POA: Diagnosis not present

## 2016-02-13 DIAGNOSIS — H353190 Nonexudative age-related macular degeneration, unspecified eye, stage unspecified: Secondary | ICD-10-CM | POA: Diagnosis present

## 2016-02-13 DIAGNOSIS — I10 Essential (primary) hypertension: Secondary | ICD-10-CM

## 2016-02-13 HISTORY — DX: Arteritis, unspecified: I77.6

## 2016-02-13 HISTORY — DX: Essential (primary) hypertension: I10

## 2016-02-13 HISTORY — DX: Nonexudative age-related macular degeneration, unspecified eye, stage unspecified: H35.3190

## 2016-02-13 LAB — VAS US CAROTID
LCCADSYS: -92 cm/s
LCCAPDIAS: 14 cm/s
LCCAPSYS: 106 cm/s
LEFT ECA DIAS: -6 cm/s
Left CCA dist dias: -18 cm/s
Left ICA dist dias: -11 cm/s
Left ICA dist sys: -56 cm/s
Left ICA prox dias: 15 cm/s
Left ICA prox sys: 74 cm/s
RCCADSYS: -104 cm/s
RCCAPDIAS: 8 cm/s
RCCAPSYS: 64 cm/s
RIGHT CCA MID DIAS: -9 cm/s
RIGHT ECA DIAS: 12 cm/s

## 2016-02-13 NOTE — Discharge Summary (Signed)
Physician Discharge Summary  Patient ID: Heidi Hobbs MRN: [id number] DOB/AGE: 1940/12/07 75 y.o.  Admit date: 01/23/2016 Discharge date: 02/01/2016  Admission Diagnoses:  Acute appendicitis with possible perforation  Carotid stenosis COPD/tobcco use 63 years Hypertension Hyperlipidemia Macular degeneration - dry  Vasculitis - Rx at [facility]- on colchicine  Discharge Diagnoses:  Acute appendicitis with perforation and gangrene Carotid stenosis COPD/tobcco use 63 years Hypertension Hyperlipidemia Macular degeneration - dry  Vasculitis - Rx at [facility]- on colchicine    Principal Problem:   Acute appendicitis with perforation and peritoneal abscess Active Problems:   COPD GOLD I   Carotid artery disease (HCC)   Essential hypertension   Vasculitis (HCC)   Macular degeneration, dry   PROCEDURES: S/p Laparoscopic Appendectomy11/10/17 Dr. Bernardo Heater Course:  pt presents with the abdominal pain that started last PM after eating [employer].  She has been nauseated but has not vomited, some chills last pM. .  She presents now with very severe pain, nausea and constipation.  Work up in the ED shows she is afebrile, Tachycardic, BP is trending down some. Na 133, K+ 3.5, glucose 133, WBC 18.7, UA nitrate negative.  CT scan shows:  Acute appendicitis. Note that the cecum is within the left abdomen, with the appendix tracking from left to right over the midline.  Inflammatory changes in the adjacent fat, with questionable flocculent changes either within the appendiceal tip or representing micro perforation.  Currently Temp 101.3, HR 109,137/57.  She was admitted and taken to the OR that evening by Dr. Kae Heller.  Post op she had a slow course, related to an ileus and loose stools.  Issue with her baseline COPD, age, and generalized deconditioning.   Repeat CT scan on 01/29/16, showed no acute issues, just ongoing ileus.  Her drain was pulled on 01/30/16. Her diet was advanced  and by 11/19 she was ready for discharge.    Condition on d/c:  Improved  CBC Latest Ref Rng & Units 02/01/2016 01/30/2016 01/29/2016  WBC 4.0 - 10.5 K/uL 11.1(H) 12.6(H) 12.5(H)  Hemoglobin 12.0 - 15.0 g/dL 11.6(L) 12.4 12.3  Hematocrit 36.0 - 46.0 % 35.6(L) 37.4 36.6  Platelets 150 - 400 K/uL 413(H) 392 332   CMP Latest Ref Rng & Units 02/01/2016 01/30/2016 01/29/2016  Glucose 65 - 99 mg/dL 96 107(H) 108(H)  BUN 6 - 20 mg/dL 7 6 6   Creatinine 0.44 - 1.00 mg/dL 0.57 0.53 0.51  Sodium 135 - 145 mmol/L 137 135 135  Potassium 3.5 - 5.1 mmol/L 3.3(L) 3.8 4.0  Chloride 101 - 111 mmol/L 105 102 103  CO2 22 - 32 mmol/L 24 23 23   Calcium 8.9 - 10.3 mg/dL 8.6(L) 8.7(L) 8.6(L)  Total Protein 6.5 - 8.1 g/dL - - -  Total Bilirubin 0.3 - 1.2 mg/dL - - -  Alkaline Phos 38 - 126 U/L - - -  AST 15 - 41 U/L - - -  ALT 14 - 54 U/L - - -    Disposition: 01-Home or Self Care  Discharge Instructions    For home use only DME 3 n 1    Complete by:  As directed    Walker rolling    Complete by:  As directed        Medication List    TAKE these medications   acetaminophen 325 MG tablet Commonly known as:  TYLENOL Take 2 tablets (650 mg total) by mouth every 4 (four) hours as needed for mild pain, moderate pain,  fever or headache.   albuterol 108 (90 Base) MCG/ACT inhaler Commonly known as:  PROVENTIL HFA;VENTOLIN HFA Inhale 1 puff into the lungs every 6 (six) hours as needed for wheezing or shortness of breath.   amitriptyline 100 MG tablet Commonly known as:  ELAVIL Take 50 mg by mouth at bedtime.   amLODipine 5 MG tablet Commonly known as:  NORVASC Take 5 mg by mouth daily.   aspirin 81 MG EC tablet Take 81 mg by mouth daily.   atenolol 50 MG tablet Commonly known as:  TENORMIN Take 50 mg by mouth daily.   atorvastatin 40 MG tablet Commonly known as:  LIPITOR Take 20 mg by mouth daily.   budesonide-formoterol 160-4.5 MCG/ACT inhaler Commonly known as:  SYMBICORT Inhale 2  puffs into the lungs 2 (two) times daily as needed.   colchicine 0.6 MG tablet Take 0.6 mg by mouth daily.   docusate sodium 100 MG capsule Commonly known as:  COLACE You can buy this at any drug store, over the counter and follow package instructions   EPINEPHrine 0.3 mg/0.3 mL Soaj injection Commonly known as:  EPI-PEN Inject 0.3 mLs into the muscle once as needed. Allergic reaction   ezetimibe 10 MG tablet Commonly known as:  ZETIA Take 10 mg by mouth daily.   levocetirizine 5 MG tablet Commonly known as:  XYZAL Take 5 mg by mouth daily.   oxyCODONE-acetaminophen 5-325 MG tablet Commonly known as:  PERCOCET/ROXICET Take 1-2 tablets by mouth every 4 (four) hours as needed for moderate pain.   PRESERVISION AREDS 2 Caps Take 2 tablets by mouth 2 (two) times daily.   saccharomyces boulardii 250 MG capsule Commonly known as:  FLORASTOR You can buy this over the counter at any drugstore and use for the next month.   traMADol 50 MG tablet Commonly known as:  ULTRAM Take 1-2 tablets (50-100 mg total) by mouth every 6 (six) hours as needed for moderate pain or severe pain.   VITAMIN B-12 PO Take by mouth. 3000 iu daily   Vitamin D3 2000 units Tabs Take 1 tablet by mouth daily.            Durable Medical Equipment        Start     Ordered   02/01/16 0000  For home use only DME 3 n 1     02/01/16 1150     Follow-up Information    Clovis Riley, MD Follow up.   Specialty:  General Surgery Why:  call for an appointment in 2 weeks Contact information: [phone]        Precious Reel, MD Follow up.   Specialty:  Internal Medicine Why:  CAll for follow up and let him review all your medicines and blood pressure. Contact information: [address] [place] [address] [phone]           Signed: Earnstine Regal 02/13/2016, 3:33 PM

## 2016-02-23 ENCOUNTER — Other Ambulatory Visit: Payer: Self-pay | Admitting: Surgery

## 2016-02-23 DIAGNOSIS — R109 Unspecified abdominal pain: Secondary | ICD-10-CM

## 2016-03-12 ENCOUNTER — Ambulatory Visit
Admission: RE | Admit: 2016-03-12 | Discharge: 2016-03-12 | Disposition: A | Discharge status: Home / Self Care | Payer: 59 | Source: Ambulatory Visit | Attending: Surgery | Admitting: Surgery

## 2016-03-12 DIAGNOSIS — R109 Unspecified abdominal pain: Secondary | ICD-10-CM

## 2016-03-12 DIAGNOSIS — K5730 Diverticulosis of large intestine without perforation or abscess without bleeding: Secondary | ICD-10-CM | POA: Diagnosis not present

## 2016-03-12 MED ORDER — IOPAMIDOL (ISOVUE-300) INJECTION 61%
100.0000 mL | Freq: Once | INTRAVENOUS | Status: AC | PRN
  Administered 2016-03-12: 100 mL via INTRAVENOUS

## 2016-04-02 DIAGNOSIS — J449 Chronic obstructive pulmonary disease, unspecified: Secondary | ICD-10-CM | POA: Diagnosis not present

## 2016-04-02 DIAGNOSIS — R109 Unspecified abdominal pain: Secondary | ICD-10-CM | POA: Diagnosis not present

## 2016-04-02 DIAGNOSIS — R197 Diarrhea, unspecified: Secondary | ICD-10-CM | POA: Diagnosis not present

## 2016-04-02 DIAGNOSIS — R1030 Lower abdominal pain, unspecified: Secondary | ICD-10-CM | POA: Diagnosis not present

## 2016-04-02 DIAGNOSIS — Z6822 Body mass index (BMI) 22.0-22.9, adult: Secondary | ICD-10-CM | POA: Diagnosis not present

## 2016-04-05 ENCOUNTER — Other Ambulatory Visit: Payer: Self-pay | Admitting: Internal Medicine

## 2016-04-05 ENCOUNTER — Ambulatory Visit
Admission: RE | Admit: 2016-04-05 | Discharge: 2016-04-05 | Disposition: A | Discharge status: Home / Self Care | Payer: Medicare Other | Source: Ambulatory Visit | Attending: Internal Medicine | Admitting: Internal Medicine

## 2016-04-05 DIAGNOSIS — M318 Other specified necrotizing vasculopathies: Secondary | ICD-10-CM | POA: Diagnosis not present

## 2016-04-05 DIAGNOSIS — Z79899 Other long term (current) drug therapy: Secondary | ICD-10-CM | POA: Diagnosis not present

## 2016-04-05 DIAGNOSIS — K5730 Diverticulosis of large intestine without perforation or abscess without bleeding: Secondary | ICD-10-CM | POA: Diagnosis not present

## 2016-04-05 DIAGNOSIS — L509 Urticaria, unspecified: Secondary | ICD-10-CM | POA: Diagnosis not present

## 2016-04-05 DIAGNOSIS — Z5181 Encounter for therapeutic drug level monitoring: Secondary | ICD-10-CM | POA: Diagnosis not present

## 2016-04-05 DIAGNOSIS — R109 Unspecified abdominal pain: Secondary | ICD-10-CM

## 2016-04-05 DIAGNOSIS — R197 Diarrhea, unspecified: Secondary | ICD-10-CM

## 2016-04-05 DIAGNOSIS — I776 Arteritis, unspecified: Secondary | ICD-10-CM | POA: Diagnosis not present

## 2016-04-05 MED ORDER — IOPAMIDOL (ISOVUE-300) INJECTION 61%: 100.0000 mL | Freq: Once | INTRAVENOUS | Status: DC | PRN

## 2016-04-26 DIAGNOSIS — R300 Dysuria: Secondary | ICD-10-CM | POA: Diagnosis not present

## 2016-04-26 DIAGNOSIS — N390 Urinary tract infection, site not specified: Secondary | ICD-10-CM | POA: Diagnosis not present

## 2016-05-07 ENCOUNTER — Encounter: Payer: Self-pay | Admitting: Vascular Surgery

## 2016-05-11 ENCOUNTER — Ambulatory Visit
Admission: RE | Admit: 2016-05-11 | Discharge: 2016-05-11 | Disposition: A | Discharge status: Home / Self Care | Payer: Medicare Other | Source: Ambulatory Visit | Attending: Vascular Surgery | Admitting: Vascular Surgery

## 2016-05-11 DIAGNOSIS — I6523 Occlusion and stenosis of bilateral carotid arteries: Secondary | ICD-10-CM | POA: Diagnosis not present

## 2016-05-11 DIAGNOSIS — I739 Peripheral vascular disease, unspecified: Principal | ICD-10-CM

## 2016-05-11 DIAGNOSIS — I779 Disorder of arteries and arterioles, unspecified: Secondary | ICD-10-CM

## 2016-05-11 MED ORDER — IOPAMIDOL (ISOVUE-370) INJECTION 76%
75.0000 mL | Freq: Once | INTRAVENOUS | Status: AC | PRN
  Administered 2016-05-11: 75 mL via INTRAVENOUS

## 2016-05-13 NOTE — Progress Notes (Signed)
Established Carotid Patient  History of Present Illness  Heidi Hobbs is a 76 y.o. (June 28, 1940) female who presents with chief complaint: bilateral leg neuropathy.  The patient was recovering from suppurative appendicitis at her last visit.  Her prior whole body "illness" and abdominal pain has resolved since then.  The patient's current sx are burning and paraesthesias in both feet.  She denies any chronic back pain and is unaware of chemical exposures or development of neuropathy after drug intake.  Previous carotid studies demonstrated: RICA 123456 stenosis, LICA 123456 stenosis.  Prior CTA in 2012 demonstrated significant arch disease.  Patient has no history of TIA or stroke symptom.  The patient has never had amaurosis fugax or monocular blindness.  This patient has had diplopia since she got polio as a child. She needs prism glasses to adjust the image for her diplopia. The patient has known L eye macular degeneration.  The patient has never had facial drooping or hemiplegia.  The patient has never had receptive or expressive aphasia.   The patient notes some continued dizziness with turning her head to the right.   She denies any other vertebrobasilar sx.  She denies any additional recent CVA or TIA sx  The patient's PMH, PSH, SH, and FamHx are unchanged from 02/13/16  Current Outpatient Prescriptions  Medication Sig Dispense Refill  . acetaminophen (TYLENOL) 325 MG tablet Take 2 tablets (650 mg total) by mouth every 4 (four) hours as needed for mild pain, moderate pain, fever or headache.    . albuterol (PROVENTIL HFA;VENTOLIN HFA) 108 (90 Base) MCG/ACT inhaler Inhale 1 puff into the lungs every 6 (six) hours as needed for wheezing or shortness of breath.    Marland Kitchen amitriptyline (ELAVIL) 100 MG tablet Take 50 mg by mouth at bedtime.     Marland Kitchen amLODipine (NORVASC) 5 MG tablet Take 5 mg by mouth daily.     Marland Kitchen aspirin 81 MG EC tablet Take 81 mg by mouth daily.     Marland Kitchen atenolol (TENORMIN) 50 MG  tablet Take 50 mg by mouth daily.    Marland Kitchen atorvastatin (LIPITOR) 40 MG tablet Take 20 mg by mouth daily.     . budesonide-formoterol (SYMBICORT) 160-4.5 MCG/ACT inhaler Inhale 2 puffs into the lungs 2 (two) times daily as needed.    . Cholecalciferol (VITAMIN D3) 2000 UNITS TABS Take 1 tablet by mouth daily.    . colchicine 0.6 MG tablet Take 0.6 mg by mouth daily.     . Cyanocobalamin (VITAMIN B-12 PO) Take by mouth. 3000 iu daily    . docusate sodium (COLACE) 100 MG capsule You can buy this at any drug store, over the counter and follow package instructions 10 capsule 0  . doxycycline (VIBRAMYCIN) 100 MG capsule   0  . EPINEPHrine 0.3 mg/0.3 mL IJ SOAJ injection Inject 0.3 mLs into the muscle once as needed. Allergic reaction    . ezetimibe (ZETIA) 10 MG tablet Take 10 mg by mouth daily.    Marland Kitchen HYDROcodone-acetaminophen (NORCO/VICODIN) 5-325 MG tablet   0  . levocetirizine (XYZAL) 5 MG tablet Take 5 mg by mouth daily.  5  . Multiple Vitamins-Minerals (PRESERVISION AREDS 2) CAPS Take 2 tablets by mouth 2 (two) times daily.    Marland Kitchen oxyCODONE-acetaminophen (PERCOCET/ROXICET) 5-325 MG tablet Take 1-2 tablets by mouth every 4 (four) hours as needed for moderate pain. (Patient not taking: Reported on 02/13/2016) 20 tablet 0  . predniSONE (DELTASONE) 10 MG tablet   0  .  saccharomyces boulardii (FLORASTOR) 250 MG capsule You can buy this over the counter at any drugstore and use for the next month.    . traMADol (ULTRAM) 50 MG tablet Take 1-2 tablets (50-100 mg total) by mouth every 6 (six) hours as needed for moderate pain or severe pain. 40 tablet 0   No current facility-administered medications for this visit.    On ROS today: no radiculopathy, no further abdominal pain, no CVA or TIA sx  Physical Examination  Vitals:   05/14/16 1312 05/14/16 1314  BP: (!) 160/81 (!) 147/77  Pulse: 81   Resp: 20   Temp: 97.2 F (36.2 C)   TempSrc: Oral   SpO2: 96%   Weight: 157 lb (71.2 kg)   Height: 5\' 8"   (1.727 m)     Body mass index is 23.87 kg/m.  General: Alert, O x 3, WD, WN  Eyes: PERRLA, EOMI,   Neck: Supple, mid-line trachea,    Pulmonary: Sym exp, good B air movt,CTA B  Cardiac: RRR, Nl S1, S2, no Murmurs, No rubs, No S3,S4  Vascular: Vessel Right Left  Radial Palpable Palpable  Brachial Palpable Palpable  Carotid Palpable, No Bruit Palpable, No Bruit  Aorta Not palpable N/A  Femoral Palpable Palpable  Popliteal Not palpable Not palpable  PT Palpable Palpable  DP Palpable Palpable   Gastrointestinal: soft, NTND, -G/R, no CVA tenderness,  Musculoskeletal: M/S 5/5 throughout  , Extremities without ischemic changes  , No edema present, extensive varicosities B (L>R), no LDS, no edema BL  Neurologic: CN 2-12 intact, Pain and light touch intact in extremities, Motor exam as listed above  CTA Neck (05/11/16) 1. Progressed since 2012 soft and calcified plaque at the right carotid bifurcation and right ICA origin stenosis estimated at 60% with respect to the distal vessel.  2. Minimal progression of left cervical carotid atherosclerosis since 2012 with no significant stenosis. 3. Dominant left vertebral artery with atherosclerosis but no significant stenosis. Non dominant and diminutive right vertebral artery with chronic moderate stenosis at its origin. 4.  Calcified aortic atherosclerosis. 5. Stable and benign right upper lobe lung nodule with no follow-up necessary.  I reviewed this patient's CTA, timing of scan and contrast injection is a little off resulting in some interference on a few slices of the R ICA.  The recon of this segment suggest <70% stenosis though.  There is 0000000 disease in the LICA stenosis.  There is extensive aortic arch calcific atherosclerosis.  It is difficult to fully evaluate the great vessel orifices without using the recons.  There appears to be no hemodynamically significant disease in the take offs.  The left vertebral artery appears to  be hypertrophy and patent all the way up.  The right vertebral artery is much smaller in comparison.     Medical Decision Making  Heidi Hobbs is a 76 y.o. (1941/01/15) female who presents with: h/o aortic arch atherosclerosis, abnormally low diastolic velocities, asx B ICA stenosis <80%, possible vertebrobasilar sx, s/p lap appy for suppurative appendicitis, chronic venous insufficiency    I don't see anything to account for the low EDV with minimal ICA disease on left and <70% on right.  The aortic arch is heavily calcified and diseased but all great vessels appear patent without significant stenoses.  The left vertebral artery is hypertrophied with inline flow to the basilar, suggesting the R is providing substantially less contribution.  I don't think this is the source of dizziness.  I recommend: q6 month  B carotid duplex  I discussed in depth with the patient the nature of atherosclerosis, and emphasized the importance of maximal medical management including strict control of blood pressure, blood glucose, and lipid levels, antiplatelet agents, obtaining regular exercise, and cessation of smoking.    The patient is aware that without maximal medical management the underlying atherosclerotic disease process will progress, limiting the benefit of any interventions.  The patient is currently on a statin:  Lipitor.  The patient is currently on an anti-platelet: ASA.  In regards to her leg sx, she has intact arterial flow with palpable pulses.  I suspect another etiology for her peripheral neuropathy.  Would not be surprise if her neuropathy is due a drug interaction given the long-term use of Elavil.    Thank you for allowing [place] to participate in this patient's care.   Adele Barthel, MD, FACS [facility] Office: [phone] Pager: [phone]

## 2016-05-14 ENCOUNTER — Ambulatory Visit (INDEPENDENT_AMBULATORY_CARE_PROVIDER_SITE_OTHER): Payer: Medicare Other | Admitting: Vascular Surgery

## 2016-05-14 ENCOUNTER — Encounter: Payer: Self-pay | Admitting: Vascular Surgery

## 2016-05-14 VITALS — BP 147/77 | HR 81 | Temp 97.2°F | Resp 20 | Ht 68.0 in | Wt 157.0 lb

## 2016-05-14 DIAGNOSIS — I6501 Occlusion and stenosis of right vertebral artery: Secondary | ICD-10-CM

## 2016-05-14 DIAGNOSIS — I779 Disorder of arteries and arterioles, unspecified: Secondary | ICD-10-CM | POA: Diagnosis not present

## 2016-05-14 DIAGNOSIS — I700 Atherosclerosis of aorta: Secondary | ICD-10-CM

## 2016-05-14 DIAGNOSIS — I6509 Occlusion and stenosis of unspecified vertebral artery: Secondary | ICD-10-CM | POA: Insufficient documentation

## 2016-05-14 DIAGNOSIS — I739 Peripheral vascular disease, unspecified: Principal | ICD-10-CM

## 2016-05-21 NOTE — Addendum Note (Signed)
Addended by: Lianne Cure A on: 05/21/2016 09:50 AM   Modules accepted: Orders

## 2016-07-06 DIAGNOSIS — R808 Other proteinuria: Secondary | ICD-10-CM | POA: Diagnosis not present

## 2016-07-06 DIAGNOSIS — M859 Disorder of bone density and structure, unspecified: Secondary | ICD-10-CM | POA: Diagnosis not present

## 2016-07-06 DIAGNOSIS — D519 Vitamin B12 deficiency anemia, unspecified: Secondary | ICD-10-CM | POA: Diagnosis not present

## 2016-07-06 DIAGNOSIS — E784 Other hyperlipidemia: Secondary | ICD-10-CM | POA: Diagnosis not present

## 2016-07-06 DIAGNOSIS — R7309 Other abnormal glucose: Secondary | ICD-10-CM | POA: Diagnosis not present

## 2016-07-12 DIAGNOSIS — H353131 Nonexudative age-related macular degeneration, bilateral, early dry stage: Secondary | ICD-10-CM | POA: Diagnosis not present

## 2016-07-13 DIAGNOSIS — I6521 Occlusion and stenosis of right carotid artery: Secondary | ICD-10-CM | POA: Diagnosis not present

## 2016-07-13 DIAGNOSIS — Z0000 Encounter for general adult medical examination without abnormal findings: Secondary | ICD-10-CM | POA: Diagnosis not present

## 2016-07-13 DIAGNOSIS — R300 Dysuria: Secondary | ICD-10-CM | POA: Diagnosis not present

## 2016-07-13 DIAGNOSIS — K5909 Other constipation: Secondary | ICD-10-CM | POA: Diagnosis not present

## 2016-09-29 DIAGNOSIS — N390 Urinary tract infection, site not specified: Secondary | ICD-10-CM | POA: Diagnosis not present

## 2016-09-29 DIAGNOSIS — R300 Dysuria: Secondary | ICD-10-CM | POA: Diagnosis not present

## 2016-10-01 ENCOUNTER — Other Ambulatory Visit: Payer: Self-pay | Admitting: Internal Medicine

## 2016-10-01 DIAGNOSIS — R918 Other nonspecific abnormal finding of lung field: Secondary | ICD-10-CM | POA: Diagnosis not present

## 2016-10-01 DIAGNOSIS — R911 Solitary pulmonary nodule: Secondary | ICD-10-CM

## 2016-10-01 DIAGNOSIS — J449 Chronic obstructive pulmonary disease, unspecified: Secondary | ICD-10-CM | POA: Diagnosis not present

## 2016-10-01 DIAGNOSIS — J189 Pneumonia, unspecified organism: Secondary | ICD-10-CM | POA: Diagnosis not present

## 2016-10-01 DIAGNOSIS — R05 Cough: Secondary | ICD-10-CM | POA: Diagnosis not present

## 2016-10-06 ENCOUNTER — Ambulatory Visit
Admission: RE | Admit: 2016-10-06 | Discharge: 2016-10-06 | Disposition: A | Discharge status: Home / Self Care | Payer: Medicare Other | Source: Ambulatory Visit | Attending: Internal Medicine | Admitting: Internal Medicine

## 2016-10-06 DIAGNOSIS — R911 Solitary pulmonary nodule: Secondary | ICD-10-CM

## 2016-10-11 DIAGNOSIS — R634 Abnormal weight loss: Secondary | ICD-10-CM | POA: Diagnosis not present

## 2016-10-11 DIAGNOSIS — K5909 Other constipation: Secondary | ICD-10-CM | POA: Diagnosis not present

## 2016-10-11 DIAGNOSIS — I700 Atherosclerosis of aorta: Secondary | ICD-10-CM | POA: Diagnosis not present

## 2016-10-11 DIAGNOSIS — R918 Other nonspecific abnormal finding of lung field: Secondary | ICD-10-CM | POA: Diagnosis not present

## 2016-11-10 ENCOUNTER — Encounter: Payer: Self-pay | Admitting: Vascular Surgery

## 2016-11-16 NOTE — Progress Notes (Signed)
Established Carotid Patient   History of Present Illness   Heidi Hobbs is a 76 y.o. (Mar 01, 1941) female who presents with chief complaint: bilateral leg numbness.  The patient has had a recent flare of her "vasculiitis" which results in bumps in her arms.  Previous carotid studies demonstrated: RICA 51-88% stenosis, LICA 4-16% stenosis. Prior CTA in 2012 demonstrated significant arch disease. CTA in 2018 demonstrated: <70% stenosis throughout R ICA and <30% stenosis in L ICA with extensive aortic arch calcific atherosclerosis without obvious great vessel orifical disease.    Patient has nohistory of TIA or stroke symptom. The patient has neverhad amaurosis fugax or monocular blindness. This patient has had diplopia since she got polio as a child. She needs prism glasses to adjust the image for her diplopia. The patient has known L eye macular degeneration. The patient has neverhad facial drooping or hemiplegia. The patient has neverhad receptive or expressive aphasia. The patient notes some continued dizziness with turning her head to the upwards. She denies any other vertebrobasilar sx.  She denies any additional recent CVA or TIA sx.    The patient's PMH, PSH, SH, and FamHx are unchanged from 05/14/16.  Current Outpatient Prescriptions  Medication Sig Dispense Refill  . acetaminophen (TYLENOL) 325 MG tablet Take 2 tablets (650 mg total) by mouth every 4 (four) hours as needed for mild pain, moderate pain, fever or headache.    . albuterol (PROVENTIL HFA;VENTOLIN HFA) 108 (90 Base) MCG/ACT inhaler Inhale 1 puff into the lungs every 6 (six) hours as needed for wheezing or shortness of breath.    Marland Kitchen amitriptyline (ELAVIL) 100 MG tablet Take 50 mg by mouth at bedtime.     Marland Kitchen amLODipine (NORVASC) 5 MG tablet Take 5 mg by mouth daily.     Marland Kitchen aspirin 81 MG EC tablet Take 81 mg by mouth daily.     Marland Kitchen atenolol (TENORMIN) 50 MG tablet Take 50 mg by mouth daily.    Marland Kitchen atorvastatin  (LIPITOR) 40 MG tablet Take 20 mg by mouth daily.     . colchicine 0.6 MG tablet Take 0.6 mg by mouth daily.     Marland Kitchen docusate sodium (COLACE) 100 MG capsule You can buy this at any drug store, over the counter and follow package instructions 10 capsule 0  . EPINEPHrine 0.3 mg/0.3 mL IJ SOAJ injection Inject 0.3 mLs into the muscle once as needed. Allergic reaction    . ezetimibe (ZETIA) 10 MG tablet Take 10 mg by mouth daily.    Marland Kitchen levocetirizine (XYZAL) 5 MG tablet Take 5 mg by mouth daily.  5  . Multiple Vitamins-Minerals (PRESERVISION AREDS 2) CAPS Take 2 tablets by mouth 2 (two) times daily.    Marland Kitchen saccharomyces boulardii (FLORASTOR) 250 MG capsule You can buy this over the counter at any drugstore and use for the next month.     No current facility-administered medications for this visit.     On ROS today: B leg paraesthesia, +dizziness with extending neck   Physical Examination   Vitals:   11/19/16 1459 11/19/16 1501  BP: 138/78 (!) 142/79  Pulse: 74   SpO2: 96%   Weight: 145 lb 12.8 oz (66.1 kg)   Height: 5\' 8"  (1.727 m)    Body mass index is 22.17 kg/m.  General Alert, O x 3, WD, NAD  Neck Supple, mid-line trachea,    Pulmonary Sym exp, good B air movt, CTA B  Cardiac RRR, Nl S1, S2, no Murmurs,  No rubs, No S3,S4  Vascular Vessel Right Left  Radial Palpable Palpable  Brachial Palpable Palpable  Carotid Palpable, No Bruit Palpable, No Bruit  Aorta Not palpable N/A  Femoral Palpable Palpable  Popliteal Not palpable Not palpable  PT Palpable Palpable  DP Palpable Palpable    Gastro- intestinal soft, non-distended, non-tender to palpation, No guarding or rebound, no HSM, no masses, no CVAT B, No palpable prominent aortic pulse,    Musculo- skeletal M/S 5/5 throughout  , Extremities without ischemic changes    Neurologic Cranial nerves 2-12 intact , Pain and light touch intact in extremities , Motor exam as listed above    Non-Invasive Vascular Imaging   B Carotid  Duplex (11/19/2016):   R ICA stenosis:  40-59%  R VA: patent and antegrade  L ICA stenosis:  1-39%  L VA: patent and antegrade   Medical Decision Making   Heidi Hobbs is a 76 y.o. female who presents with: h/o aortic arch atherosclerosis, abnormally low diastolic velocities, asx B ICA stenosis <80%, possible vertebrobasilar sx, s/p lap appy for suppurative appendicitis, chronic venous insufficiency    Again this patient's EDV are low, but now they are consistently low.  Based on the patient's vascular studies and examination, I have offered the patient: q6 month B carotid duplex..  I discussed in depth with the patient the nature of atherosclerosis, and emphasized the importance of maximal medical management including strict control of blood pressure, blood glucose, and lipid levels, antiplatelet agents, obtaining regular exercise, and cessation of smoking.    The patient is aware that without maximal medical management the underlying atherosclerotic disease process will progress, limiting the benefit of any interventions. The patient is currently on a statin: Lipitor.  The patient is currently on an anti-platelet: ASA.  Thank you for allowing [place] to participate in this patient's care.   Adele Barthel, MD, FACS [facility] Office: [phone] Pager: [phone]

## 2016-11-19 ENCOUNTER — Ambulatory Visit (INDEPENDENT_AMBULATORY_CARE_PROVIDER_SITE_OTHER): Payer: Medicare Other | Admitting: Vascular Surgery

## 2016-11-19 ENCOUNTER — Ambulatory Visit (HOSPITAL_COMMUNITY)
Admission: RE | Admit: 2016-11-19 | Discharge: 2016-11-19 | Disposition: A | Discharge status: Home / Self Care | Payer: Medicare Other | Source: Ambulatory Visit | Attending: Vascular Surgery | Admitting: Vascular Surgery

## 2016-11-19 VITALS — BP 142/79 | HR 74 | Ht 68.0 in | Wt 145.8 lb

## 2016-11-19 DIAGNOSIS — I700 Atherosclerosis of aorta: Secondary | ICD-10-CM

## 2016-11-19 DIAGNOSIS — I779 Disorder of arteries and arterioles, unspecified: Secondary | ICD-10-CM

## 2016-11-19 DIAGNOSIS — I739 Peripheral vascular disease, unspecified: Principal | ICD-10-CM

## 2016-11-26 NOTE — Addendum Note (Signed)
Addended by: Brinley Treanor A on: 11/26/2016 04:08 PM   Modules accepted: Orders  

## 2017-01-11 DIAGNOSIS — M545 Low back pain: Secondary | ICD-10-CM | POA: Diagnosis not present

## 2017-01-11 DIAGNOSIS — I700 Atherosclerosis of aorta: Secondary | ICD-10-CM | POA: Diagnosis not present

## 2017-01-11 DIAGNOSIS — R634 Abnormal weight loss: Secondary | ICD-10-CM | POA: Diagnosis not present

## 2017-01-11 DIAGNOSIS — R7309 Other abnormal glucose: Secondary | ICD-10-CM | POA: Diagnosis not present

## 2017-01-14 DIAGNOSIS — H353221 Exudative age-related macular degeneration, left eye, with active choroidal neovascularization: Secondary | ICD-10-CM | POA: Diagnosis not present

## 2017-02-15 DIAGNOSIS — H353221 Exudative age-related macular degeneration, left eye, with active choroidal neovascularization: Secondary | ICD-10-CM | POA: Diagnosis not present

## 2017-02-28 DIAGNOSIS — N390 Urinary tract infection, site not specified: Secondary | ICD-10-CM | POA: Diagnosis not present

## 2017-02-28 DIAGNOSIS — R300 Dysuria: Secondary | ICD-10-CM | POA: Diagnosis not present

## 2017-02-28 DIAGNOSIS — J111 Influenza due to unidentified influenza virus with other respiratory manifestations: Secondary | ICD-10-CM | POA: Diagnosis not present

## 2017-02-28 DIAGNOSIS — M545 Low back pain: Secondary | ICD-10-CM | POA: Diagnosis not present

## 2017-02-28 DIAGNOSIS — R05 Cough: Secondary | ICD-10-CM | POA: Diagnosis not present

## 2017-03-23 DIAGNOSIS — R5383 Other fatigue: Secondary | ICD-10-CM | POA: Diagnosis not present

## 2017-03-23 DIAGNOSIS — J449 Chronic obstructive pulmonary disease, unspecified: Secondary | ICD-10-CM | POA: Diagnosis not present

## 2017-03-23 DIAGNOSIS — R0602 Shortness of breath: Secondary | ICD-10-CM | POA: Diagnosis not present

## 2017-03-23 DIAGNOSIS — R05 Cough: Secondary | ICD-10-CM | POA: Diagnosis not present

## 2017-03-29 DIAGNOSIS — H353221 Exudative age-related macular degeneration, left eye, with active choroidal neovascularization: Secondary | ICD-10-CM | POA: Diagnosis not present

## 2017-04-25 DIAGNOSIS — I10 Essential (primary) hypertension: Secondary | ICD-10-CM | POA: Diagnosis not present

## 2017-04-25 DIAGNOSIS — J09X2 Influenza due to identified novel influenza A virus with other respiratory manifestations: Secondary | ICD-10-CM | POA: Diagnosis not present

## 2017-04-25 DIAGNOSIS — R600 Localized edema: Secondary | ICD-10-CM | POA: Diagnosis not present

## 2017-04-25 DIAGNOSIS — J111 Influenza due to unidentified influenza virus with other respiratory manifestations: Secondary | ICD-10-CM | POA: Diagnosis not present

## 2017-04-26 ENCOUNTER — Other Ambulatory Visit: Payer: Self-pay

## 2017-04-26 DIAGNOSIS — R609 Edema, unspecified: Secondary | ICD-10-CM

## 2017-05-06 ENCOUNTER — Ambulatory Visit (INDEPENDENT_AMBULATORY_CARE_PROVIDER_SITE_OTHER)
Admission: RE | Admit: 2017-05-06 | Discharge: 2017-05-06 | Disposition: A | Discharge status: Home / Self Care | Payer: Medicare Other | Source: Ambulatory Visit | Attending: Surgery | Admitting: Surgery

## 2017-05-06 ENCOUNTER — Ambulatory Visit (HOSPITAL_COMMUNITY)
Admission: RE | Admit: 2017-05-06 | Discharge: 2017-05-06 | Disposition: A | Discharge status: Home / Self Care | Payer: Medicare Other | Source: Ambulatory Visit | Attending: Surgery | Admitting: Surgery

## 2017-05-06 DIAGNOSIS — R609 Edema, unspecified: Secondary | ICD-10-CM | POA: Diagnosis not present

## 2017-05-06 DIAGNOSIS — I872 Venous insufficiency (chronic) (peripheral): Secondary | ICD-10-CM | POA: Insufficient documentation

## 2017-05-17 DIAGNOSIS — H353221 Exudative age-related macular degeneration, left eye, with active choroidal neovascularization: Secondary | ICD-10-CM | POA: Diagnosis not present

## 2017-05-23 NOTE — Progress Notes (Signed)
Established Carotid Patient   History of Present Illness   Heidi Hobbs is a 77 y.o. (03-Aug-1940) female who presents with chief complaint: left eye sight degeneration.    The patient recently has had worsening of her left glaucoma and requires intraocular injections for such.  Previous carotid studies demonstrated: RICA 39-03% stenosis, LICA 0-09% stenosis. Prior CTA in 2012 demonstrated significant arch disease. CTA in 2018 demonstrated: <70% stenosis throughout R ICA and <30% stenosis in L ICA with extensive aortic arch calcific atherosclerosis without obvious great vessel orifical disease.  Patient has nohistory of TIA or stroke symptoms. The patient has neverhad amaurosis fugax or monocular blindness. This patient has had diplopia since she got polio as a child. She needs prism glasses to adjust the image for her diplopia. The patient has neverhad facial drooping or hemiplegia. The patient has neverhad receptive or expressive aphasia. The patient notes some continued dizziness with turning her head to the upwards. She denies any other vertebrobasilar sx.   The patient also notes onset of severe right leg swelling with development of a venous stasis ulcer on the medial malleolus.  This swelling improved rapidly with diuretic use and leg elevation.  The ulcer healed rapidly without any complications.  The patient notes no intermittent claudication or rest pain.  She does not nocturnal cramping.  The patient's PMH, PSH, SH, and FamHx are unchanged from 11/19/16.   Current Outpatient Medications  Medication Sig Dispense Refill  . acetaminophen (TYLENOL) 325 MG tablet Take 2 tablets (650 mg total) by mouth every 4 (four) hours as needed for mild pain, moderate pain, fever or headache.    . albuterol (PROVENTIL HFA;VENTOLIN HFA) 108 (90 Base) MCG/ACT inhaler Inhale 1 puff into the lungs every 6 (six) hours as needed for wheezing or shortness of breath.    Marland Kitchen  amitriptyline (ELAVIL) 100 MG tablet Take 50 mg by mouth at bedtime.     Marland Kitchen amLODipine (NORVASC) 5 MG tablet Take 5 mg by mouth daily.     Marland Kitchen aspirin 81 MG EC tablet Take 81 mg by mouth daily.     Marland Kitchen atenolol (TENORMIN) 50 MG tablet Take 50 mg by mouth daily.    Marland Kitchen atorvastatin (LIPITOR) 40 MG tablet Take 20 mg by mouth daily.     . colchicine 0.6 MG tablet Take 0.6 mg by mouth daily.     Marland Kitchen docusate sodium (COLACE) 100 MG capsule You can buy this at any drug store, over the counter and follow package instructions 10 capsule 0  . EPINEPHrine 0.3 mg/0.3 mL IJ SOAJ injection Inject 0.3 mLs into the muscle once as needed. Allergic reaction    . ezetimibe (ZETIA) 10 MG tablet Take 10 mg by mouth daily.    Marland Kitchen levocetirizine (XYZAL) 5 MG tablet Take 5 mg by mouth daily.  5  . losartan (COZAAR) 50 MG tablet Take 1 tablet by mouth daily.  2  . Multiple Vitamins-Minerals (PRESERVISION AREDS 2) CAPS Take 2 tablets by mouth 2 (two) times daily.    Marland Kitchen saccharomyces boulardii (FLORASTOR) 250 MG capsule You can buy this over the counter at any drugstore and use for the next month.     No current facility-administered medications for this visit.     On ROS today: healed R VSU, nocturnal B calf cramping   Physical Examination   Vitals:   05/25/17 1538 05/25/17 1540  BP: (!) 157/88 (!) 147/84  Pulse: 74   Resp: 18   SpO2:  96%   Weight: 156 lb 1.6 oz (70.8 kg)   Height: 5\' 8"  (1.727 m)    Body mass index is 23.73 kg/m.   General Alert, O x 3, WD, NAD  Neck Supple, mid-line trachea,    Pulmonary Sym exp, good B air movt, CTA B  Cardiac RRR, Nl S1, S2, no Murmurs, No rubs, No S3,S4  Vascular Vessel Right Left  Radial Palpable Palpable  Brachial Palpable Palpable  Carotid Palpable, No Bruit Palpable, No Bruit  Aorta Not palpable N/A  Femoral Palpable Palpable  Popliteal Not palpable Not palpable  PT Palpable Palpable  DP Palpable Palpable    Gastro- intestinal soft, non-distended, non-tender  to palpation, No guarding or rebound, no HSM, no masses, no CVAT B, No palpable prominent aortic pulse,    Musculo- skeletal M/S 5/5 throughout  , Extremities without ischemic changes    Neurologic Cranial nerves 2-12 intact , Pain and light touch intact in extremities , Motor exam as listed above    Non-invasive Vascular Imaging     B Carotid Duplex (05/25/2017):  Right Carotid Findings:  PSV cm/s EDV cm/s Stenosis Describe Comments  CCA Prox 89 8     CCA Mid 67 12     CCA Distal 195 26  heterogenous    ICA Prox 258 46  heterogenous    ICA Mid 96 15     ICA Distal 63 15     ECA 322 19  heterogenous  tortuous     PSV cm/s EDV cms Describe Arm Pressure (mmHG)  Subclavian 166 9 Multiphasic, WNL     Vertebral PSV cm/s 42 EDV cm/s 6 Antegrade     Right Stent(s):  PSV cm/s EDV cm/s Stenosis Waveform Comments  Prox to Stent       Proximal Stent       Mid Stent       Distal Stent       Distal to Stent          PSV cm/s EDV cm/s Stenosis Waveform Comments  Prox to Stent       Proximal Stent       Mid Stent       Distal Stent       Distal to Stent         Right Graft:  PSV cm/s Stenosis Waveform Comments  Inflow      Prox anastomosis      Proximal graft      Mid graft      Distal graft      Distal anastomosis      Outflow      Left Carotid Findings:  PSV cm/s EDV cm/s Stenosis Describe Comments  CCA Prox 124 13  heterogenous    CCA Mid 112 17  heterogenous    CCA Distal 91 14     ICA Prox 103 14  heterogenous    ICA Mid 97 17     ICA Distal 82 14     ECA 185 8  heterogenous      Subclavian PSV cm/s EDV cm/s Describe Arm Pressure (mmHG)   186 0 Multiphasic, WNL     Vertebral PSV cm/s 50 EDV cm/s 9 Antegrade    Final Interpretation: Right Carotid: Velocities in the right ICA are consistent with a 40-59% stenosis.    Left Carotid: Velocities in the left ICA are consistent with a 1-39% stenosis.    Vertebrals:Bilateral vertebral arteries demonstrate  antegrade flow.   BLE  ABI (05/06/17): ABI Findings: +--------+------------------+-----+----------+--------+ Right  Rt Pressure (mmHg)IndexWaveform Comment  +--------+------------------+-----+----------+--------+ [id number]                     +--------+------------------+-----+----------+--------+ ATA   149        0.87 triphasic      +--------+------------------+-----+----------+--------+ PTA   134        0.78 monophasic     +--------+------------------+-----+----------+--------+ DP   114        0.66           +--------+------------------+-----+----------+--------+  +---------+------------------+-----+----------+-------+ Left   Lt Pressure (mmHg)IndexWaveform Comment +---------+------------------+-----+----------+-------+ Brachial 172                     +---------+------------------+-----+----------+-------+ ATA   118        0.69 triphasic      +---------+------------------+-----+----------+-------+ PERO   114        0.66 monophasic     +---------+------------------+-----+----------+-------+ Great Toe104        0.60           +---------+------------------+-----+----------+-------+  +-------+-----------+-----------+------------+------------+ ABI/TBIToday's ABIToday's TBIPrevious ABIPrevious TBI +-------+-----------+-----------+------------+------------+ Right 0.87    0.66                 +-------+-----------+-----------+------------+------------+ Left  0.69    0.60                 +-------+-----------+-----------+------------+------------+  Final Interpretation: Right: Resting right ankle-brachial index indicates mild right lower extremity arterial disease. The right toe-brachial index is abnormal. Left: Resting left ankle-brachial index  indicates moderate left lower extremity arterial disease. The left toe-brachial index is abnormal. LT Great toe pressure = 104 mmHg.  BLE Venous reflux (05/06/17): Venous Reflux Times Normal value < 0.5 sec +--------------+----------+---------+        Right (ms)Left (ms) +--------------+----------+---------+ CFV      2501.2        +--------------+----------+---------+ FV      762.8         +--------------+----------+---------+ GSV mid thigh      535.4   +--------------+----------+---------+ GSV dist thigh     1995.1   +--------------+----------+---------+  Vein Diameters: +-------------------+----------+---------+           Right (cm)Left (cm) +-------------------+----------+---------+ GSV at SFJ     0.62   0.61    +-------------------+----------+---------+ GSV at prox thigh 0.46   0.51    +-------------------+----------+---------+ GSV at mid thigh  0.46   0.52    +-------------------+----------+---------+ GSV at distal thigh0.46   0.45    +-------------------+----------+---------+ GSV at knee    0.34   0.35    +-------------------+----------+---------+ GSV prox calf   0.33   0.34    +-------------------+----------+---------+ GSV mid calf    0.31   0.27    +-------------------+----------+---------+ SSV        0.22   0.26    +-------------------+----------+---------+   Right No evidence of DVT or SVT. Deep venous reflux seen in the common femoral and femoral veins. No evidence of GSV or SSV reflux. Left No evidence of DVT or SVT. No evidence of deep venous reflux GSV reflux is seen in the mid and distal thigh portions of the vein. No evidence of small saphenous vein reflux.  Final Interpretation:  Right: Normal reflux times were noted in the popliteal vein, great saphenous vein at the groin, great  saphenous vein at the proximal thigh, great saphenous vein at the mid thigh, great saphenous vein at the distal thigh, great saphenous vein at  the knee, and small saphenous vein at the level of the sapheno-popliteal junction. Abnormal reflux times were noted in the common femoral vein, and femoral vein in the thigh. Evidence of chronic venous insufficiency is detected in the deep venous system. There is no evidence of deep vein thrombosis in the lower extremity.There is no evidence of superficial venous thrombosis.  Left: Normal reflux times were detected in the common femoral vein , femoral vein in the thigh, popliteal vein, great saphenous vein at the groin, great saphenous vein at the proximal thigh, great saphenous vein at the knee, and small saphenous vein at  the level of the sapheno-popliteal junction. Abnormal reflux times were noted in the great saphenous vein at the mid thigh, and great saphenous vein at the distal thigh. Evidence of chronic venous insufficiency is detected in the great saphenous vein.  There is no evidence of deep vein thrombosis in the lower extremity.There is no evidence of superficial venous thrombosis.   Medical Decision Making   Heidi Hobbs is a 78 y.o. (Jul 23, 1940) female who presents with: aortic arch atherosclerosis, abnormally low diastolic velocities, asx B ICA stenosis <80%, possible vertebrobasilar sx, s/p lap appy for suppurative appendicitis, chronic venous insufficiency (C5):  R deep system reflux, L superficial reflux, prior healed R VSU, RLE mild PAD, LLE mod PAD,    Based on the patient's vascular studies and examination, I have offered the patient: q6 month B carotid duplex and BLE ABI.  Nothing much has changed in regards to the B carotid duplex.  Diastolic velocities appear to be very low as always.  In regards to her CVI, the side with the VSU was the right.  The reflux on this side is deep system, so compressive therapy is the only  available therapy.   In this patient, I suspect 20-30 mm Hg thigh high compression stockings may be difficult to put on.  She will subsequently use 10-15 mm Hg OTC stockings to help prevent a recurrence.  I also suspect this degree of compression is less likely to cause issues with the patient underlying PAD  I discussed in depth with the patient the nature of atherosclerosis, and emphasized the importance of maximal medical management including strict control of blood pressure, blood glucose, and lipid levels, antiplatelet agents, obtaining regular exercise, and cessation of smoking.    The patient is aware that without maximal medical management the underlying atherosclerotic disease process will progress, limiting the benefit of any interventions.  The patient is currently on a statin: Lipitor.   The patient is currently on an anti-platelet: ASA.  Thank you for allowing [place] to participate in this patient's care.   Adele Barthel, MD, FACS [facility] Office: [phone] Pager: [phone]

## 2017-05-25 ENCOUNTER — Ambulatory Visit (HOSPITAL_COMMUNITY)
Admission: RE | Admit: 2017-05-25 | Discharge: 2017-05-25 | Disposition: A | Discharge status: Home / Self Care | Payer: Medicare Other | Source: Ambulatory Visit | Attending: Vascular Surgery | Admitting: Vascular Surgery

## 2017-05-25 ENCOUNTER — Encounter: Payer: Self-pay | Admitting: Vascular Surgery

## 2017-05-25 ENCOUNTER — Ambulatory Visit (INDEPENDENT_AMBULATORY_CARE_PROVIDER_SITE_OTHER): Payer: Medicare Other | Admitting: Vascular Surgery

## 2017-05-25 VITALS — BP 147/84 | HR 74 | Resp 18 | Ht 68.0 in | Wt 156.1 lb

## 2017-05-25 DIAGNOSIS — I700 Atherosclerosis of aorta: Secondary | ICD-10-CM | POA: Diagnosis not present

## 2017-05-25 DIAGNOSIS — I779 Disorder of arteries and arterioles, unspecified: Secondary | ICD-10-CM | POA: Insufficient documentation

## 2017-05-25 DIAGNOSIS — I6523 Occlusion and stenosis of bilateral carotid arteries: Secondary | ICD-10-CM | POA: Diagnosis not present

## 2017-05-25 DIAGNOSIS — I83009 Varicose veins of unspecified lower extremity with ulcer of unspecified site: Secondary | ICD-10-CM | POA: Insufficient documentation

## 2017-05-25 DIAGNOSIS — I739 Peripheral vascular disease, unspecified: Secondary | ICD-10-CM

## 2017-05-25 DIAGNOSIS — I70219 Atherosclerosis of native arteries of extremities with intermittent claudication, unspecified extremity: Secondary | ICD-10-CM | POA: Insufficient documentation

## 2017-05-25 DIAGNOSIS — I83013 Varicose veins of right lower extremity with ulcer of ankle: Secondary | ICD-10-CM

## 2017-05-25 DIAGNOSIS — I70213 Atherosclerosis of native arteries of extremities with intermittent claudication, bilateral legs: Secondary | ICD-10-CM | POA: Diagnosis not present

## 2017-05-25 DIAGNOSIS — L97909 Non-pressure chronic ulcer of unspecified part of unspecified lower leg with unspecified severity: Secondary | ICD-10-CM | POA: Insufficient documentation

## 2017-05-25 DIAGNOSIS — L97319 Non-pressure chronic ulcer of right ankle with unspecified severity: Secondary | ICD-10-CM | POA: Diagnosis not present

## 2017-05-27 ENCOUNTER — Encounter (HOSPITAL_COMMUNITY): Payer: Medicare Other

## 2017-05-27 ENCOUNTER — Ambulatory Visit: Payer: Medicare Other | Admitting: Vascular Surgery

## 2017-06-01 ENCOUNTER — Other Ambulatory Visit: Payer: Self-pay

## 2017-06-01 DIAGNOSIS — I6523 Occlusion and stenosis of bilateral carotid arteries: Secondary | ICD-10-CM

## 2017-06-01 DIAGNOSIS — I70213 Atherosclerosis of native arteries of extremities with intermittent claudication, bilateral legs: Secondary | ICD-10-CM

## 2017-06-21 DIAGNOSIS — H353221 Exudative age-related macular degeneration, left eye, with active choroidal neovascularization: Secondary | ICD-10-CM | POA: Diagnosis not present

## 2017-06-21 DIAGNOSIS — H353111 Nonexudative age-related macular degeneration, right eye, early dry stage: Secondary | ICD-10-CM | POA: Diagnosis not present

## 2017-06-30 DIAGNOSIS — I10 Essential (primary) hypertension: Secondary | ICD-10-CM | POA: Diagnosis not present

## 2017-06-30 DIAGNOSIS — J441 Chronic obstructive pulmonary disease with (acute) exacerbation: Secondary | ICD-10-CM | POA: Diagnosis not present

## 2017-07-11 DIAGNOSIS — R82998 Other abnormal findings in urine: Secondary | ICD-10-CM | POA: Diagnosis not present

## 2017-07-11 DIAGNOSIS — D519 Vitamin B12 deficiency anemia, unspecified: Secondary | ICD-10-CM | POA: Diagnosis not present

## 2017-07-11 DIAGNOSIS — I10 Essential (primary) hypertension: Secondary | ICD-10-CM | POA: Diagnosis not present

## 2017-07-11 DIAGNOSIS — E7849 Other hyperlipidemia: Secondary | ICD-10-CM | POA: Diagnosis not present

## 2017-07-11 DIAGNOSIS — R7309 Other abnormal glucose: Secondary | ICD-10-CM | POA: Diagnosis not present

## 2017-07-11 DIAGNOSIS — M859 Disorder of bone density and structure, unspecified: Secondary | ICD-10-CM | POA: Diagnosis not present

## 2017-07-18 DIAGNOSIS — J479 Bronchiectasis, uncomplicated: Secondary | ICD-10-CM | POA: Diagnosis not present

## 2017-07-18 DIAGNOSIS — M310 Hypersensitivity angiitis: Secondary | ICD-10-CM | POA: Diagnosis not present

## 2017-07-18 DIAGNOSIS — R600 Localized edema: Secondary | ICD-10-CM | POA: Diagnosis not present

## 2017-07-18 DIAGNOSIS — Z0000 Encounter for general adult medical examination without abnormal findings: Secondary | ICD-10-CM | POA: Diagnosis not present

## 2017-07-22 ENCOUNTER — Ambulatory Visit (INDEPENDENT_AMBULATORY_CARE_PROVIDER_SITE_OTHER): Payer: Medicare Other | Admitting: Internal Medicine

## 2017-07-22 ENCOUNTER — Encounter: Payer: Self-pay | Admitting: Internal Medicine

## 2017-07-22 VITALS — BP 144/64 | HR 64 | Ht 68.0 in | Wt 153.0 lb

## 2017-07-22 DIAGNOSIS — R05 Cough: Secondary | ICD-10-CM | POA: Diagnosis not present

## 2017-07-22 DIAGNOSIS — J449 Chronic obstructive pulmonary disease, unspecified: Secondary | ICD-10-CM

## 2017-07-22 DIAGNOSIS — F17210 Nicotine dependence, cigarettes, uncomplicated: Secondary | ICD-10-CM

## 2017-07-22 DIAGNOSIS — R059 Cough, unspecified: Secondary | ICD-10-CM

## 2017-07-22 MED ORDER — ACETAMINOPHEN-CODEINE #3 300-30 MG PO TABS: 1.0000 | ORAL_TABLET | ORAL | 0 refills | Status: AC | PRN

## 2017-07-22 NOTE — Progress Notes (Signed)
Subjective:    Patient ID: Heidi Hobbs, female    DOB: 1940/03/17  MRN: [id number]   Brief patient profile:  87 yowf active smoker with cough that improved but didn't completely resolve  s llimited by sob then breathing worse in April 2014 while being being treated by  Dr Fredderick Phenix  For hives, rx with prn Ventolin but didn't help resting sob and dx with bronchiectasis and GOLD I copd criteria 09/27/12   History of Present Illness  08/17/2012 1st pulmonary cc indolent cough x sev years better p quit smoking, not limited by breathing. Cough is mostly clear more p shower, and in ams also but doesn't disturb sleep. rec Mucinex or mucinex dm prn  09/27/2012 f/u ov/[facility] returns for pft:  Bronchiectasis/ gold I criteria for copd  Chief Complaint  Patient presents with  . Followup with PFT    Breathing is unchanged since the last visit. No new co's today.   took high dose prednisone for hives, no perceived benefit on breathing but not typically limited by breathing, min cough does not feel it's bad enough to even use mucinex rec For cough best recommendation would be mucinex or mucinex dm as needed     07/22/2017 1st [facility] office visit/ [facility] / new pt eval (last seen > 3 years prior to [facility]  )   Chief Complaint  Patient presents with  . Pulmonary Consult    Referred by Dr. Virgina Jock. Pt c/o increased cough and SOB for the past 3 wks. She is coughing up yellow sputum.  She states that she was dxed flu 3 x this year.  She states she gets winded walking short distances such as from room to room at home. She feels fatigued.   can't lie down due to cough x 3 weeks but ever since Jan 2019 acute flu like illness  never back to baseline with sob/ fatigue which really dates back to Nov 2017 and then flared 3 weeks > abx and pred > [facility] rx 07/18/17   > nebulizer and more prednisone  But severe cough persists , min sputum vol worse hs  Doe = MMRC3 = can't walk 100 yards even at a slow pace at a flat  grade s stopping due to sob     No obvious patterns to day to day or daytime variability or assoc excess  purulent sputum or mucus plugs or hemoptysis or cp or chest tightness, subjective wheeze or overt sinus or hb symptoms. No unusual exposure hx or h/o childhood pna/ asthma or knowledge of premature birth.   . Also denies any obvious fluctuation of symptoms with weather or environmental changes or other aggravating or alleviating factors except as outlined above   Current Allergies, Complete Past Medical History, Past Surgical History, Family History, and Social History were reviewed in [employer] record.  ROS  The following are not active complaints unless bolded Hoarseness, sore throat, dysphagia, dental problems, itching, sneezing,  nasal congestion or discharge of excess mucus or purulent secretions, ear ache,   fever, chills, sweats, unintended wt loss or wt gain, classically pleuritic or exertional cp,  orthopnea pnd or arm/hand swelling  or leg swelling, presyncope, palpitations, abdominal pain, anorexia, nausea, vomiting, diarrhea  or change in bowel habits or change in bladder habits, change in stools or change in urine, dysuria, hematuria,  rash, arthralgias, visual complaints, headache, numbness, weakness or ataxia or problems with walking or coordination,  change in mood or  memory.  Current Meds  Medication Sig  . acetaminophen (TYLENOL) 325 MG tablet Take 2 tablets (650 mg total) by mouth every 4 (four) hours as needed for mild pain, moderate pain, fever or headache.  . albuterol (PROVENTIL HFA;VENTOLIN HFA) 108 (90 Base) MCG/ACT inhaler Inhale 1 puff into the lungs every 6 (six) hours as needed for wheezing or shortness of breath.  Marland Kitchen amitriptyline (ELAVIL) 100 MG tablet Take 50 mg by mouth at bedtime.   Marland Kitchen amLODipine (NORVASC) 5 MG tablet Take 5 mg by mouth daily.   Marland Kitchen aspirin 81 MG EC tablet Take 81 mg by mouth daily.   Marland Kitchen atenolol (TENORMIN) 50 MG  tablet Take 50 mg by mouth daily.  Marland Kitchen atorvastatin (LIPITOR) 40 MG tablet Take 20 mg by mouth daily.   . cetirizine (ZYRTEC) 10 MG tablet Take 10 mg by mouth daily.  . colchicine 0.6 MG tablet Take 0.6 mg by mouth daily.   Marland Kitchen docusate sodium (COLACE) 100 MG capsule You can buy this at any drug store, over the counter and follow package instructions  . EPINEPHrine 0.3 mg/0.3 mL IJ SOAJ injection Inject 0.3 mLs into the muscle once as needed. Allergic reaction  . ezetimibe (ZETIA) 10 MG tablet Take 10 mg by mouth daily.  Marland Kitchen losartan-hydrochlorothiazide (HYZAAR) 50-12.5 MG tablet Take 1 tablet by mouth daily.  . Multiple Vitamins-Minerals (PRESERVISION AREDS 2) CAPS Take 2 tablets by mouth 2 (two) times daily.  . predniSONE (DELTASONE) 20 MG tablet Take 1 tablet by mouth daily.  Marland Kitchen saccharomyces boulardii (FLORASTOR) 250 MG capsule You can buy this over the counter at any drugstore and use for the next month.                Objective:   Physical Exam   07/22/2017        153   08/17/12 157 lb 9.6 oz (71.487 kg)  05/04/11 156 lb (70.761 kg)  04/20/11 156 lb 6.4 oz (70.943 kg)      Edentulous/ harsh coughing fits   amb wf with very harsh dry sounding coughing fits    Vital signs reviewed - Note on arrival 02 sats  93% on RA   HEENT: nl dentition, turbinates bilaterally, and oropharynx. Nl external ear canals without cough reflex   NECK :  without JVD/Nodes/TM/ nl carotid upstrokes bilaterally   LUNGS: no acc muscle use,  Nl contour chest with minimal insp and exp rhonchi and cough on insp > exp  CV:  RRR  no s3 or murmur or increase in P2, and no edema   ABD:  soft and nontender with nl inspiratory excursion in the supine position. No bruits or organomegaly appreciated, bowel sounds nl  MS:  Nl gait/ ext warm without deformities, calf tenderness, cyanosis or clubbing No obvious joint restrictions   SKIN: warm and dry without lesions    NEURO:  alert, approp, nl sensorium  with  no motor or cerebellar deficits apparent.            Assessment & Plan:

## 2017-07-22 NOTE — Patient Instructions (Addendum)
Bronchiectasis =   you have scarring of your bronchial tubes which means that they don't function perfectly normally and mucus tends to pool in certain areas of your lung which can cause pneumonia and further scarring of your lung and bronchial tubes  Whenever you develop cough congestion take mucinex or mucinex dm  1200 mg every 12 hours > these will help keep the mucus loose and flowing but if your condition worsens you need to seek help immediately preferably here or somewhere inside the [facility] system to compare xrays ( worse = darker or bloody mucus or pain on breathing in)   Try prilosec otc 20 mg  Take 30-60 min before first meal of the day and Pepcid ac (famotidine) 20 mg one @  bedtime until cough is completely gone for at least a week without the need for cough suppression   GERD (REFLUX)  is an extremely common cause of respiratory symptoms just like yours , many times with no obvious heartburn at all.    It can be treated with medication, but also with lifestyle changes including elevation of the head of your bed (ideally with 6 inch  bed blocks),  Smoking cessation, avoidance of late meals, excessive alcohol, and avoid fatty foods, chocolate, peppermint, colas, red wine, and acidic juices such as orange juice.  NO MINT OR MENTHOL PRODUCTS SO NO COUGH DROPS   USE SUGARLESS CANDY INSTEAD (Jolley ranchers or Stover's or Life Savers) or even ice chips will also do - the key is to swallow to prevent all throat clearing. NO OIL BASED VITAMINS - use powdered substitutes.  Take delsym two tsp every 12 hours and supplement if needed with   Tylenol  # 3 every 4 hours to suppress the urge to cough. Swallowing water and/or using ice chips/non mint and menthol containing candies (such as lifesavers or sugarless jolly ranchers) are also effective.  You should rest your voice and avoid activities that you know make you cough.  Once you have eliminated the cough for 3 straight days try reducing the tylenol  #3  first,  then the delsym as tolerated.      Please schedule a follow up office visit in 4 weeks, call sooner if needed with all medications /inhalers/ solutions in hand so we can verify exactly what you are taking. This includes all medications from all doctors and over the [place] separate them into two bags:  the ones you take automatically, no matter what, vs the ones you take just when you feel you need them "BAG #2 is UP TO YOU"  - this will really help [place] help you take your medications more effectively.  - PFTs on return and don't use your nebulizer that am

## 2017-07-24 ENCOUNTER — Encounter: Payer: Self-pay | Admitting: Internal Medicine

## 2017-07-24 DIAGNOSIS — Z87891 Personal history of nicotine dependence: Secondary | ICD-10-CM | POA: Insufficient documentation

## 2017-07-24 DIAGNOSIS — F17210 Nicotine dependence, cigarettes, uncomplicated: Secondary | ICD-10-CM | POA: Insufficient documentation

## 2017-07-24 NOTE — Assessment & Plan Note (Signed)
-   PFT's FEV1 FEV1  2.32 (93%) ratio 68 with no change p B2, DLCO 62 with correcttion to 64%  - The proper method of use, as well as anticipated side effects, of a metered-dose inhaler are discussed and demonstrated to the patient.    Ok to just use prn saba here.

## 2017-07-24 NOTE — Assessment & Plan Note (Signed)
>   3 min discussion  I emphasized that although we never turn away smokers from the pulmonary clinic, we do ask that they understand that the recommendations that we make  won't work nearly as well in the presence of continued cigarette exposure.  In fact, we may very well  reach a point where we can't promise to help the patient if  she can't quit smoking. (We can and will promise to try to help, we just can't promise what we recommend will really work)

## 2017-07-24 NOTE — Assessment & Plan Note (Addendum)
Symptoms are markedly disproportionate to objective findings and not clear to what extent this is actually a pulmonary  problem but pt does appear to have difficult to sort out respiratory symptoms of unknown origin for which  DDX  = almost all start with A and  include Adherence, Ace Inhibitors, Acid Reflux, Active Sinus Disease, Alpha 1 Antitripsin deficiency, Anxiety masquerading as Airways dz,  ABPA,  Allergy(esp in young), Aspiration (esp in elderly), Adverse effects of meds,  Active smokers, A bunch of PE's/clot burden (a few small clots can't cause this syndrome unless there is already severe underlying pulm or vascular dz with poor reserve),  , plus two Bs  = Bronchiectasis and Beta blocker use..and one C= CHF    Adherence is always the initial "prime suspect" and is a multilayered concern that requires a "trust but verify" approach in every patient - starting with knowing how to use medications, especially inhalers, correctly, keeping up with refills and understanding the fundamental difference between maintenance and prns vs those medications only taken for a very short course and then stopped and not refilled.  - see hfa instruction  Active smoking > see sep a/p  ? Acid (or non-acid) GERD > always difficult to exclude as up to 75% of pts in some series report no assoc GI/ Heartburn symptoms> rec max (24h)  acid suppression and diet restrictions/ reviewed and instructions given in writing.  - NB Of the three most common causes of  Sub-acute / recurrent or chronic cough, only one (GERD)  can actually contribute to/ trigger  the other two (asthma and post nasal drip syndrome)  and perpetuate the cylce of cough.  While not intuitively obvious, many patients with chronic low grade reflux do not cough until there is a primary insult that disturbs the protective epithelial barrier and exposes sensitive nerve endings.   This is typically viral but can due to PNDS and  either may apply here.   The point  is that once this occurs, it is difficult to eliminate the cycle  using anything but a maximally effective acid suppression regimen at least in the short run, accompanied by an appropriate diet to address non acid GERD and control / eliminate the cough itself for at least 3 days with codeine - see avs   ? Active sinus dz > consider sinus ct next ov   ? Allergy/ asthmatic component > suggested by ? resp to neb saba > needs full pfts on return/ continue prn saba but hold on further prednisone until sort out.  ? Bronchiectasis > this cough is not typical at all of bronchiectasis   ? Beta blocker effect possible > In the setting of respiratory symptoms of unknown etiology,  It would be preferable to use bystolic, the most beta -1  selective Beta blocker available in sample form, with bisoprolol the most selective generic choice  on the market, at least on a trial basis, to make sure the spillover Beta 2 effects of the less specific Beta blockers are not contributing to this patient's symptoms.    Total time devoted to counseling  > 50 % of initial 60 min office visit:  review case with pt/ discussion of options/alternatives/ personally creating written customized instructions  in presence of pt  then going over those specific  Instructions directly with the pt including how to use all of the meds but in particular covering each new medication in detail and the difference between the maintenance= "automatic" meds and the  prns using an action plan format for the latter (If this problem/symptom => do that organization reading Left to right).  Please see AVS from this visit for a full list of these instructions which I personally wrote for this pt and  are unique to this visit.  See device teaching which extended face to face time for this visit

## 2017-08-01 DIAGNOSIS — J479 Bronchiectasis, uncomplicated: Secondary | ICD-10-CM | POA: Diagnosis not present

## 2017-08-01 DIAGNOSIS — J449 Chronic obstructive pulmonary disease, unspecified: Secondary | ICD-10-CM | POA: Diagnosis not present

## 2017-08-01 DIAGNOSIS — F17210 Nicotine dependence, cigarettes, uncomplicated: Secondary | ICD-10-CM | POA: Diagnosis not present

## 2017-08-01 DIAGNOSIS — I2721 Secondary pulmonary arterial hypertension: Secondary | ICD-10-CM | POA: Diagnosis not present

## 2017-08-02 DIAGNOSIS — H353231 Exudative age-related macular degeneration, bilateral, with active choroidal neovascularization: Secondary | ICD-10-CM | POA: Diagnosis not present

## 2017-08-02 DIAGNOSIS — H353211 Exudative age-related macular degeneration, right eye, with active choroidal neovascularization: Secondary | ICD-10-CM | POA: Diagnosis not present

## 2017-08-29 DIAGNOSIS — H353231 Exudative age-related macular degeneration, bilateral, with active choroidal neovascularization: Secondary | ICD-10-CM | POA: Diagnosis not present

## 2017-08-30 ENCOUNTER — Other Ambulatory Visit: Payer: Self-pay | Admitting: Internal Medicine

## 2017-08-30 DIAGNOSIS — R05 Cough: Secondary | ICD-10-CM

## 2017-08-30 DIAGNOSIS — R059 Cough, unspecified: Secondary | ICD-10-CM

## 2017-08-31 ENCOUNTER — Ambulatory Visit (INDEPENDENT_AMBULATORY_CARE_PROVIDER_SITE_OTHER): Payer: Medicare Other | Admitting: Internal Medicine

## 2017-08-31 ENCOUNTER — Encounter: Payer: Self-pay | Admitting: Internal Medicine

## 2017-08-31 VITALS — BP 124/62 | HR 77 | Ht 64.5 in | Wt 154.0 lb

## 2017-08-31 DIAGNOSIS — J449 Chronic obstructive pulmonary disease, unspecified: Secondary | ICD-10-CM | POA: Diagnosis not present

## 2017-08-31 DIAGNOSIS — R05 Cough: Secondary | ICD-10-CM | POA: Diagnosis not present

## 2017-08-31 DIAGNOSIS — R059 Cough, unspecified: Secondary | ICD-10-CM

## 2017-08-31 DIAGNOSIS — J479 Bronchiectasis, uncomplicated: Secondary | ICD-10-CM

## 2017-08-31 LAB — PULMONARY FUNCTION TEST
DL/VA % PRED: 66 %
DL/VA: 3.43 ml/min/mmHg/L
DLCO UNC: 16.75 ml/min/mmHg
DLCO unc % pred: 57 %
FEF 25-75 POST: 1.74 L/s
FEF 25-75 Pre: 1.39 L/sec
FEF2575-%Change-Post: 25 %
FEF2575-%PRED-POST: 103 %
FEF2575-%Pred-Pre: 82 %
FEV1-%Change-Post: 6 %
FEV1-%PRED-PRE: 90 %
FEV1-%Pred-Post: 95 %
FEV1-Post: 2.23 L
FEV1-Pre: 2.09 L
FEV1FVC-%Change-Post: -3 %
FEV1FVC-%PRED-PRE: 95 %
FEV6-%Change-Post: 12 %
FEV6-%Pred-Post: 109 %
FEV6-%Pred-Pre: 97 %
FEV6-Post: 3.24 L
FEV6-Pre: 2.89 L
FEV6FVC-%Change-Post: 1 %
FEV6FVC-%Pred-Post: 104 %
FEV6FVC-%Pred-Pre: 103 %
FVC-%Change-Post: 10 %
FVC-%PRED-POST: 105 %
FVC-%PRED-PRE: 95 %
FVC-POST: 3.27 L
FVC-PRE: 2.95 L
POST FEV1/FVC RATIO: 68 %
Post FEV6/FVC ratio: 99 %
Pre FEV1/FVC ratio: 71 %
Pre FEV6/FVC Ratio: 98 %
RV % pred: 136 %
RV: 3.46 L
TLC % pred: 117 %
TLC: 6.53 L

## 2017-08-31 NOTE — Patient Instructions (Addendum)
For cough > mucinex dm up to 1200 mg every 12 hours as needed   Please see patient coordinator before you leave today  to schedule HRCT chest  First week in August 2019   Please schedule a follow up visit in 3 months but call sooner if needed

## 2017-08-31 NOTE — Progress Notes (Signed)
Subjective:   Patient ID: Heidi Hobbs, female    DOB: 1940/09/12     MRN: [id number]     Brief patient profile:  49 yowf quit smoking 07/2017 with  Chronic cough since 1980 limited by sob then breathing worse in April 2014 while being being treated by  Dr Heidi Hobbs  For hives, rx with prn Ventolin but didn't help resting sob and dx with bronchiectasis and GOLD I copd criteria 09/27/12   H/o Exposure to chlorine gas in 1980's did not required hospitalization > w/in  Few years started getting recurrent pna     History of Present Illness  08/17/2012 1st pulmonary cc indolent cough x sev years better when stops smoking, not limited by breathing. Cough is mostly clear more p shower, and in ams also but doesn't disturb sleep. rec Mucinex or mucinex dm prn  09/27/2012 f/u ov/Heidi Hobbs returns for pft:  Bronchiectasis/ gold I criteria for copd  Chief Complaint  Patient presents with  . Followup with PFT    Breathing is unchanged since the last visit. No new co's today.   took high dose prednisone for hives, no perceived benefit on breathing but not typically limited by breathing, min cough does not feel it's bad enough to even use mucinex rec For cough best recommendation would be mucinex or mucinex dm as needed     07/22/2017 1st [facility] office visit/ Heidi Hobbs / new pt eval (last seen > 3 years prior to [place]  )  / still smoking some  Chief Complaint  Patient presents with  . Pulmonary Consult    Referred by Dr. Virgina Hobbs. Pt c/o increased cough and SOB for the past 3 wks. She is coughing up yellow sputum.  She states that she was dxed flu 3 x this year.  She states she gets winded walking short distances such as from room to room at home. She feels fatigued.   can't lie down due to cough x 3 weeks but ever since Jan 2019 acute flu like illness  never back to baseline with sob/ fatigue which really dates back to Nov 2017 and then flared 3 weeks > abx and pred > Heidi Hobbs rx 07/18/17   > nebulizer  and more prednisone  But severe cough persists , min sputum vol worse hs  Doe = MMRC3 = can't walk 100 yards even at a slow pace at a flat grade s stopping due to sob   rec Bronchiectasis =   you have scarring of your bronchial tubes  Whenever you develop cough congestion take mucinex or mucinex dm  1200 mg every 12 hours > Try prilosec otc 20 mg  Take 30-60 min before first meal of the day and Pepcid ac (famotidine) 20 mg one @  bedtime until cough is completely gone for at least a week without the need for cough suppression GERD (REFLUX)  Take delsym two tsp every 12 hours and supplement if needed with   Tylenol  # 3 every 4 hours to suppress the urge to cough.    08/31/2017  f/u ov/Heidi Hobbs re: copd I/ bronchiectasis s maint rx / no longer smoking  Chief Complaint  Patient presents with  . Follow-up    PFT's done today.  Cough is much improved but still present esp at night with light yellow sputum.  She has not had to use her albuterol inhaler in the past wk.   Dyspnea:  Can do HT shopping  Now  = MMRC2 = can't walk  a nl pace on a flat grade s sob but does fine slow and flat eg   Cough: when lie down/ but not using mucinex dm or delsym    SABA use: not at all now    No obvious day to day or daytime variability or assoc   mucus plugs or hemoptysis or cp or chest tightness, subjective wheeze or overt sinus or hb symptoms. No unusual exposure hx or h/o childhood pna/ asthma or knowledge of premature birth.   Also denies any obvious fluctuation of symptoms with weather or environmental changes or other aggravating or alleviating factors except as outlined above   Current Allergies, Complete Past Medical History, Past Surgical History, Family History, and Social History were reviewed in [employer] record.  ROS  The following are not active complaints unless bolded Hoarseness, sore throat, dysphagia, dental problems, itching, sneezing,  nasal congestion or discharge of  excess mucus or purulent secretions, ear ache,   fever, chills, sweats, unintended wt loss or wt gain, classically pleuritic or exertional cp,  orthopnea pnd or arm/hand swelling  or leg swelling, presyncope, palpitations, abdominal pain, anorexia, nausea, vomiting, diarrhea  or change in bowel habits or change in bladder habits, change in stools or change in urine, dysuria, hematuria,  rash, arthralgias, visual complaints, headache, numbness, weakness or ataxia or problems with walking or coordination,  change in mood or  memory.        Current Meds plus otc prilosec q am ac and pepcid 20 mg hs   Medication Sig  . acetaminophen (TYLENOL) 325 MG tablet Take 2 tablets (650 mg total) by mouth every 4 (four) hours as needed for mild pain, moderate pain, fever or headache.  . albuterol (PROVENTIL HFA;VENTOLIN HFA) 108 (90 Base) MCG/ACT inhaler Inhale 1 puff into the lungs every 6 (six) hours as needed for wheezing or shortness of breath.  . ALPRAZolam (XANAX) 0.25 MG tablet Take 0.25 tablets by mouth daily as needed.  Marland Kitchen amitriptyline (ELAVIL) 100 MG tablet Take 50 mg by mouth at bedtime.   Marland Kitchen amLODipine (NORVASC) 5 MG tablet Take 5 mg by mouth daily.   Marland Kitchen aspirin 81 MG EC tablet Take 81 mg by mouth daily.   Marland Kitchen atenolol (TENORMIN) 50 MG tablet Take 50 mg by mouth daily.  Marland Kitchen atorvastatin (LIPITOR) 40 MG tablet Take 20 mg by mouth daily.   . cetirizine (ZYRTEC) 10 MG tablet Take 10 mg by mouth daily.  . colchicine 0.6 MG tablet Take 0.6 mg by mouth daily as needed.   . docusate sodium (COLACE) 100 MG capsule You can buy this at any drug store, over the counter and follow package instructions  . EPINEPHrine 0.3 mg/0.3 mL IJ SOAJ injection Inject 0.3 mLs into the muscle once as needed. Allergic reaction  . ezetimibe (ZETIA) 10 MG tablet Take 10 mg by mouth daily.  Marland Kitchen losartan-hydrochlorothiazide (HYZAAR) 50-12.5 MG tablet Take 1 tablet by mouth daily.  . Multiple Vitamins-Minerals (PRESERVISION AREDS 2) CAPS  Take 2 tablets by mouth 2 (two) times daily.  . predniSONE (DELTASONE) 20 MG tablet Take 0.25 tablets by mouth daily.   Marland Kitchen saccharomyces boulardii (FLORASTOR) 250 MG capsule You can buy this over the counter at any drugstore and use for the next month.                  Objective:   Physical Exam   08/31/2017        154  07/22/2017  153   08/17/12 157 lb 9.6 oz (71.487 kg)  05/04/11 156 lb (70.761 kg)  04/20/11 156 lb 6.4 oz (70.943 kg)     Vital signs reviewed - Note on arrival 02 sats  99% on RA      HEENT: nl dentition, turbinates bilaterally, and oropharynx. Nl external ear canals without cough reflex   NECK :  without JVD/Nodes/TM/ nl carotid upstrokes bilaterally   LUNGS: no acc muscle use,  Nl contour chest which is clear to A and P bilaterally without cough on insp or exp maneuvers   CV:  RRR  no s3 or murmur or increase in P2, and no edema   ABD:  soft and nontender with nl inspiratory excursion in the supine position. No bruits or organomegaly appreciated, bowel sounds nl  MS:  Nl gait/ ext warm without deformities, calf tenderness, cyanosis or clubbing No obvious joint restrictions   SKIN: warm and dry without lesions    NEURO:  alert, approp, nl sensorium with  no motor or cerebellar deficits apparent.              Assessment & Plan:

## 2017-08-31 NOTE — Assessment & Plan Note (Addendum)
-   PFT's FEV1 FEV1  2.32 (93%) ratio 68 with no change p B2, DLCO 62 with correcttion to 64% - PFT's  08/31/2017  FEV1 2.09  (90 % ) ratio 71  p no % improvement from saba p nothing  prior to study with DLCO  57 % corrects to 66 % for alv volume      I reviewed the Fletcher curve with the patient that basically indicates  if you quit smoking when your best day FEV1 is still well preserved (as is clearly  the case here)  it is highly unlikely you will progress to severe disease and informed the patient there was  no medication on the market that has proven to alter the curve/ its downward trajectory  or the likelihood of progression of their disease(unlike other chronic medical conditions such as atheroclerosis where we do think we can change the natural hx with risk reducing meds)    Therefore stopping smoking and maintaining abstinence are  the most important aspects of care, not choice of inhalers or for that matter, doctors.   Treatment other than smoking cessation  is entirely directed by severity of symptoms and focused also on reducing exacerbations, not attempting to change the natural history of the disease.    For now prn saba is all that's needed here  - The proper method of use, as well as anticipated side effects, of a metered-dose inhaler are discussed and demonstrated to the patient.   I had an extended discussion with the patient reviewing all relevant studies completed to date and  lasting 15 to 20 minutes of a 25 minute visit    See device teaching which extended face to face time for this visit.  Each maintenance medication was reviewed in detail including emphasizing most importantly the difference between maintenance and prns and under what circumstances the prns are to be triggered using an action plan format that is not reflected in the computer generated alphabetically organized AVS which I have not found useful in most complex patients, especially with respiratory  illnesses  Please see AVS for specific instructions unique to this visit that I personally wrote and verbalized to the the pt in detail and then reviewed with pt  by my nurse highlighting any  changes in therapy recommended at today's visit to their plan of care.

## 2017-08-31 NOTE — Assessment & Plan Note (Signed)
01/28/12 CT chest Mild bronchiectatic change in the right middle lobe and  lingula. In view of these changes, MAC would be a consideration - HRCT ordered for first week in Aug 2019 to compare to previous study  In absence of more evidence of pulmonary infection or symptoms of airways dz in general there is nothing to do here but keep using  the mucinex dm up to 1200 mg twice daily and keep up with flu/ pneumonia vaccinations and f/u prn flare with addition of flutter valve if needed

## 2017-08-31 NOTE — Progress Notes (Signed)
PFT done today. 

## 2017-09-07 IMAGING — CR DG CHEST 2V
3 series · 3 of 3 positions shown · non-contrast
Comparison: Chest CT 06/01/2013.  Chest radiographs 07/24/2012.

CLINICAL DATA: Abdominal pain.  Posterior chest rales on the right.

EXAM:
CHEST  2 VIEW

[chest lat]
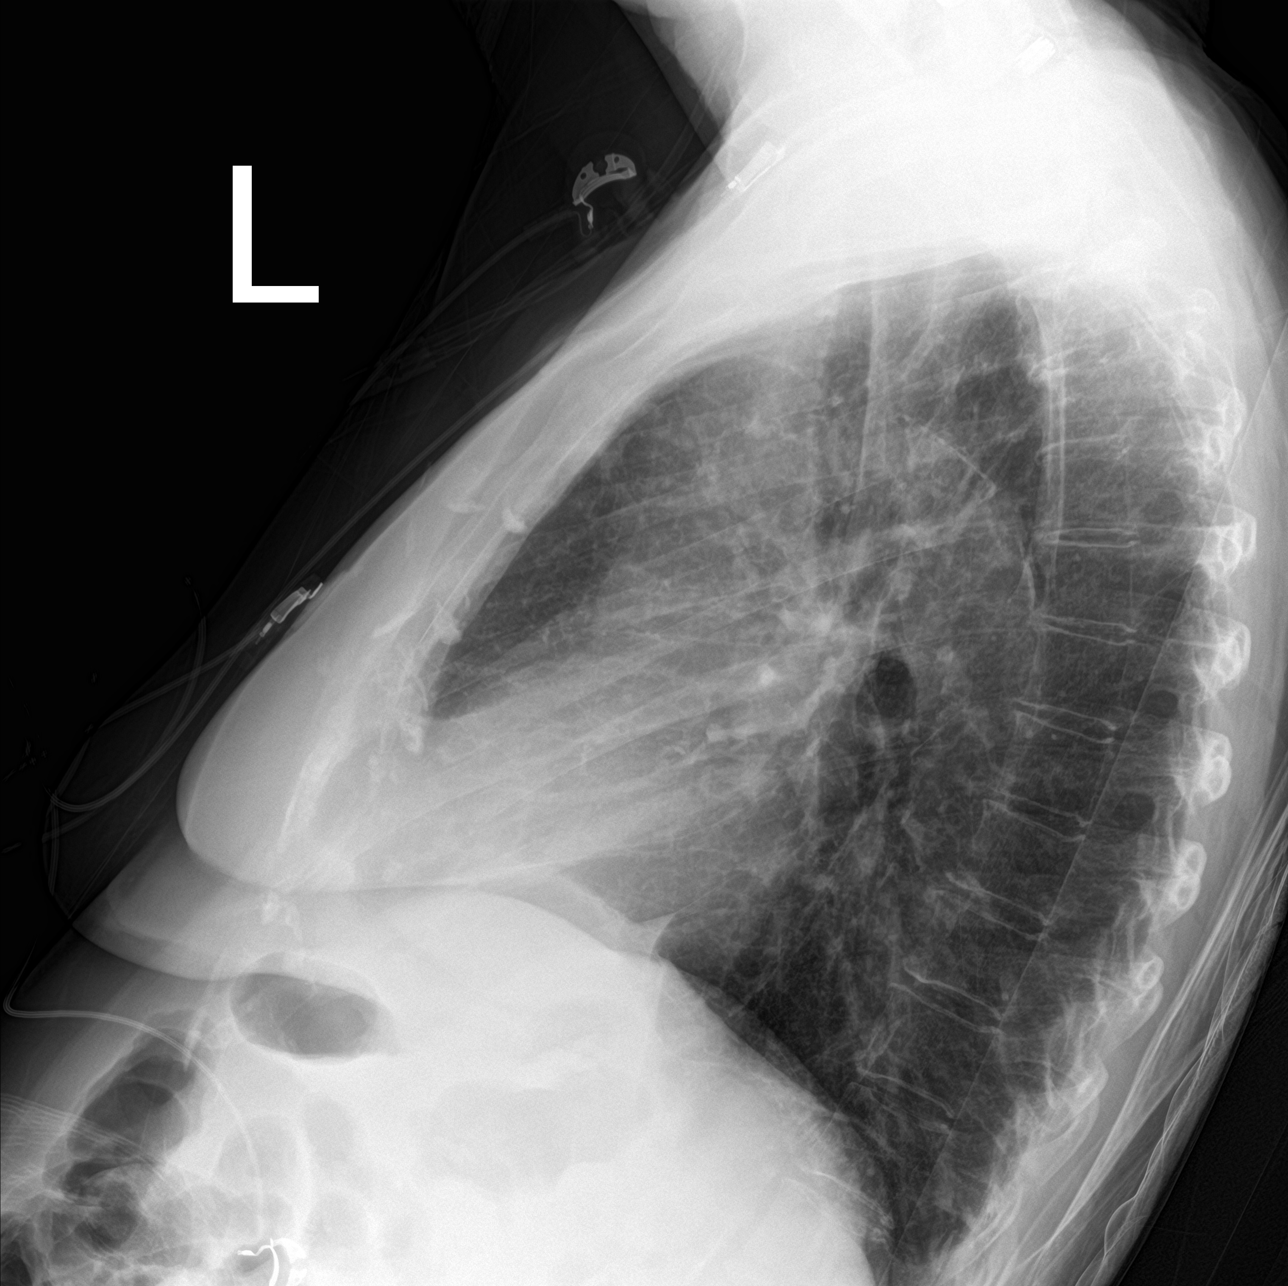

[chest ap (1 of 2)]
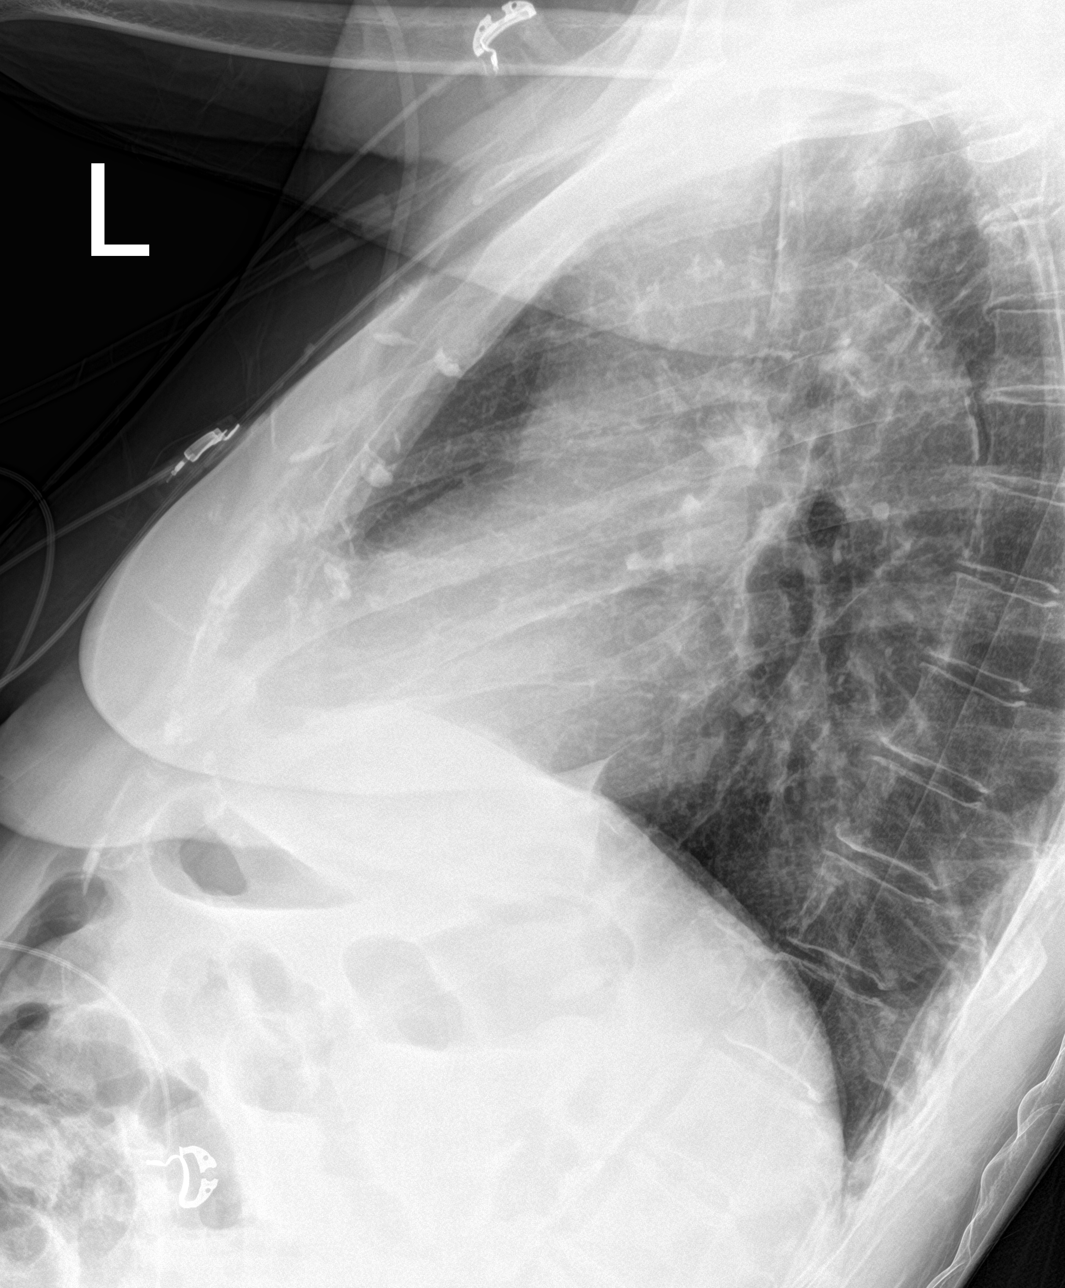

[chest ap (2 of 2)]
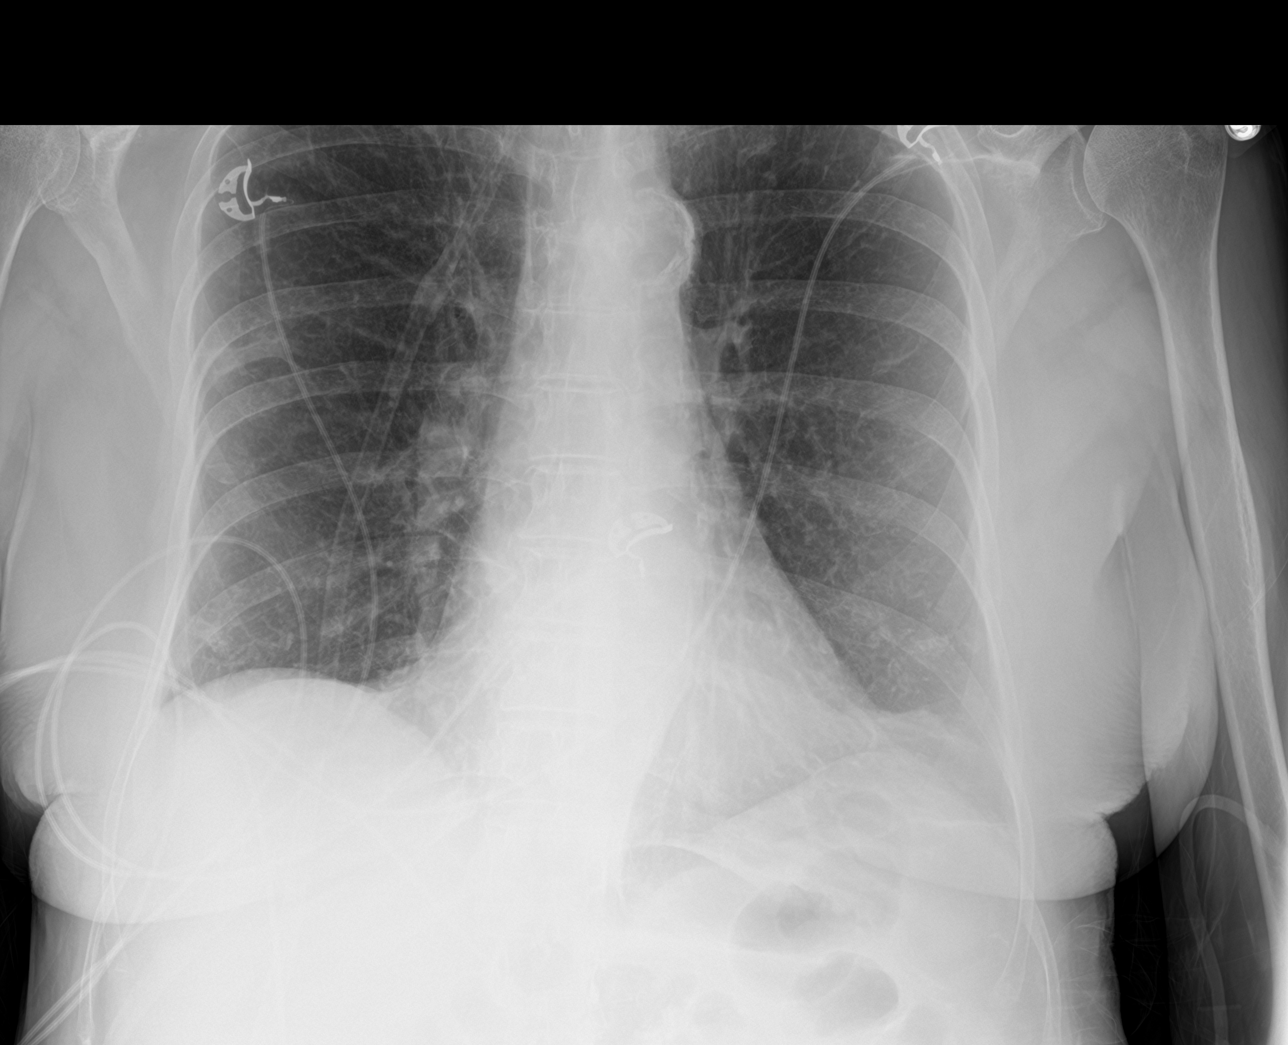

[3 of 3 positions shown; findings below may reference images not displayed]

FINDINGS: The cardiac silhouette is normal in size. Aortic atherosclerosis is
again seen. The lungs remain hyperinflated with increased airway
thickening and interstitial coarsening bilaterally. There is a small
focus of knee opacity in the right midlung which is likely in the
upper lobe along the minor fissure. Mild scarring is again seen in
the lung bases. No pleural effusion or pneumothorax is identified.
No acute osseous abnormality is seen.
IMPRESSION: Increased bronchitic changes. Focal right midlung opacity may
reflect atelectasis.

## 2017-09-20 DIAGNOSIS — H353221 Exudative age-related macular degeneration, left eye, with active choroidal neovascularization: Secondary | ICD-10-CM | POA: Diagnosis not present

## 2017-10-13 ENCOUNTER — Ambulatory Visit (INDEPENDENT_AMBULATORY_CARE_PROVIDER_SITE_OTHER)
Admission: RE | Admit: 2017-10-13 | Discharge: 2017-10-13 | Disposition: A | Discharge status: Home / Self Care | Payer: Medicare Other | Source: Ambulatory Visit | Attending: Internal Medicine | Admitting: Internal Medicine

## 2017-10-13 DIAGNOSIS — R059 Cough, unspecified: Secondary | ICD-10-CM

## 2017-10-13 DIAGNOSIS — R05 Cough: Secondary | ICD-10-CM | POA: Diagnosis not present

## 2017-10-13 DIAGNOSIS — K449 Diaphragmatic hernia without obstruction or gangrene: Secondary | ICD-10-CM | POA: Diagnosis not present

## 2017-10-13 DIAGNOSIS — J479 Bronchiectasis, uncomplicated: Secondary | ICD-10-CM

## 2017-11-06 DIAGNOSIS — J189 Pneumonia, unspecified organism: Secondary | ICD-10-CM | POA: Diagnosis not present

## 2017-11-15 DIAGNOSIS — H353221 Exudative age-related macular degeneration, left eye, with active choroidal neovascularization: Secondary | ICD-10-CM | POA: Diagnosis not present

## 2017-11-18 DIAGNOSIS — Z23 Encounter for immunization: Secondary | ICD-10-CM | POA: Diagnosis not present

## 2017-11-30 ENCOUNTER — Ambulatory Visit (HOSPITAL_COMMUNITY)
Admission: RE | Admit: 2017-11-30 | Discharge: 2017-11-30 | Disposition: A | Discharge status: Home / Self Care | Payer: Medicare Other | Source: Ambulatory Visit | Attending: Vascular Surgery | Admitting: Vascular Surgery

## 2017-11-30 ENCOUNTER — Ambulatory Visit (INDEPENDENT_AMBULATORY_CARE_PROVIDER_SITE_OTHER)
Admission: RE | Admit: 2017-11-30 | Discharge: 2017-11-30 | Disposition: A | Discharge status: Home / Self Care | Payer: Medicare Other | Source: Ambulatory Visit | Attending: Vascular Surgery | Admitting: Vascular Surgery

## 2017-11-30 ENCOUNTER — Ambulatory Visit: Payer: Medicare Other | Admitting: Vascular Surgery

## 2017-11-30 DIAGNOSIS — Z87891 Personal history of nicotine dependence: Secondary | ICD-10-CM | POA: Diagnosis not present

## 2017-11-30 DIAGNOSIS — I10 Essential (primary) hypertension: Secondary | ICD-10-CM | POA: Insufficient documentation

## 2017-11-30 DIAGNOSIS — I70213 Atherosclerosis of native arteries of extremities with intermittent claudication, bilateral legs: Secondary | ICD-10-CM | POA: Insufficient documentation

## 2017-11-30 DIAGNOSIS — I6523 Occlusion and stenosis of bilateral carotid arteries: Secondary | ICD-10-CM | POA: Diagnosis not present

## 2017-12-05 ENCOUNTER — Ambulatory Visit (INDEPENDENT_AMBULATORY_CARE_PROVIDER_SITE_OTHER): Payer: Medicare Other | Admitting: Internal Medicine

## 2017-12-05 ENCOUNTER — Encounter: Payer: Self-pay | Admitting: Internal Medicine

## 2017-12-05 VITALS — BP 142/80 | HR 88 | Ht 67.0 in | Wt 150.8 lb

## 2017-12-05 DIAGNOSIS — J449 Chronic obstructive pulmonary disease, unspecified: Secondary | ICD-10-CM

## 2017-12-05 DIAGNOSIS — J479 Bronchiectasis, uncomplicated: Secondary | ICD-10-CM

## 2017-12-05 DIAGNOSIS — R059 Cough, unspecified: Secondary | ICD-10-CM

## 2017-12-05 DIAGNOSIS — R05 Cough: Secondary | ICD-10-CM | POA: Diagnosis not present

## 2017-12-05 MED ORDER — AMOXICILLIN-POT CLAVULANATE 875-125 MG PO TABS: 1.0000 | ORAL_TABLET | Freq: Two times a day (BID) | ORAL | 0 refills | Status: AC

## 2017-12-05 NOTE — Patient Instructions (Addendum)
Augmentin 875 mg take one pill twice daily  X 10 days - take at breakfast and supper with large glass of water.  It would help reduce the usual side effects (diarrhea and yeast infections) if you ate cultured yogurt at lunch.     Bronchiectasis =   you have scarring of your bronchial tubes which means that they don't function perfectly normally and mucus tends to pool in certain areas of your lung which can cause pneumonia and further scarring of your lung and bronchial tubes  Whenever you develop cough congestion take mucinex or mucinex dm  1200 mg every 12 hours > these will help keep the mucus loose and flowing but if your condition worsens you need to seek help immediately preferably here or somewhere inside the [facility] system to compare xrays ( worse = darker or bloody mucus or pain on breathing in)     Please see patient coordinator before you leave today  to schedule sinus CT in 2 weeks - no sooner    Please schedule a follow up office visit in 4 weeks, sooner if needed

## 2017-12-05 NOTE — Progress Notes (Addendum)
Subjective:   Patient ID: Heidi Hobbs, female    DOB: 1941-02-08     MRN: [id number]     Brief patient profile:  58 yowf quit smoking 07/2017 with  Chronic cough since 1980 limited by sob then breathing worse in April 2014 while being being treated by  Dr Fredderick Phenix  For hives, rx with prn Ventolin but didn't help resting sob and dx with bronchiectasis and GOLD I copd criteria 09/27/12   H/o Exposure to chlorine gas in 1980's did not required hospitalization > w/in  Few years started getting recurrent pna     History of Present Illness  08/17/2012 1st pulmonary cc indolent cough x sev years better when stops smoking, not limited by breathing. Cough is mostly clear more p shower, and in ams also but doesn't disturb sleep. rec Mucinex or mucinex dm prn  09/27/2012 f/u ov/Heidi Hobbs returns for pft:  Bronchiectasis/ gold I criteria for copd  Chief Complaint  Patient presents with  . Followup with PFT    Breathing is unchanged since the last visit. No new co's today.   took high dose prednisone for hives, no perceived benefit on breathing but not typically limited by breathing, min cough does not feel it's bad enough to even use mucinex rec For cough best recommendation would be mucinex or mucinex dm as needed     07/22/2017 1st [facility] office visit/ Heidi Hobbs / new pt eval (last seen > 3 years prior to [place]  )  / still smoking some  Chief Complaint  Patient presents with  . Pulmonary Consult    Referred by Dr. Virgina Jock. Pt c/o increased cough and SOB for the past 3 wks. She is coughing up yellow sputum.  She states that she was dxed flu 3 x this year.  She states she gets winded walking short distances such as from room to room at home. She feels fatigued.   can't lie down due to cough x 3 weeks but ever since Jan 2019 acute flu like illness  never back to baseline with sob/ fatigue which really dates back to Nov 2017 and then flared 3 weeks > abx and pred > [place] rx 07/18/17   > nebulizer  and more prednisone  But severe cough persists , min sputum vol worse hs  Doe = MMRC3 = can't walk 100 yards even at a slow pace at a flat grade s stopping due to sob   rec Bronchiectasis =   you have scarring of your bronchial tubes  Whenever you develop cough congestion take mucinex or mucinex dm  1200 mg every 12 hours > Try prilosec otc 20 mg  Take 30-60 min before first meal of the day and Pepcid ac (famotidine) 20 mg one @  bedtime until cough is completely gone for at least a week without the need for cough suppression GERD (REFLUX)  Take delsym two tsp every 12 hours and supplement if needed with   Tylenol  # 3 every 4 hours to suppress the urge to cough.    08/31/2017  f/u ov/Heidi Hobbs re: copd I/ bronchiectasis s maint rx / no longer smoking  Chief Complaint  Patient presents with  . Follow-up    PFT's done today.  Cough is much improved but still present esp at night with light yellow sputum.  She has not had to use her albuterol inhaler in the past wk.   Dyspnea:  Can do HT shopping  Now  = MMRC2 = can't walk  a nl pace on a flat grade s sob but does fine slow and flat  Cough: when lie down/ but not using mucinex dm or delsym  SABA use: not at all now  rec For cough > mucinex dm up to 1200 mg every 12 hours as needed  Please see patient coordinator before you leave today  to schedule HRCT chest  First week in August 2019  Please schedule a follow up visit in 3 months but call sooner if needed     12/05/2017  f/u ov/Heidi Hobbs re: COPD GOLD I/ bronchiectasis  Chief Complaint  Patient presents with  . Follow-up    went to UC approx 4 wks ago and was told she had PNA-txed with pred and doxy. She states her breathing is doing okay. She is having some left side back pain. She rareley uses her albuterol.   Dyspnea:  MMRC2 = can't walk a nl pace on a flat grade s sob but does fine slow and flat  Cough: variable cough yellow and green better doxy with assoc sinus congestion, some worse since  stopped doxy as is as assoc L post cp  Sleeping: one pillow bed flat  SABA use: rarely  02: no 02    No obvious day to day or daytime variability or assoc mucus plugs or hemoptysis or   chest tightness, subjective wheeze or overt sinus or hb symptoms.   Sleeping as above  without nocturnal  or early am exacerbation  of respiratory  c/o's or need for noct saba. Also denies any obvious fluctuation of symptoms with weather or environmental changes or other aggravating or alleviating factors except as outlined above   No unusual exposure hx or h/o childhood pna/ asthma or knowledge of premature birth.  Current Allergies, Complete Past Medical History, Past Surgical History, Family History, and Social History were reviewed in [employer] record.  ROS  The following are not active complaints unless bolded Hoarseness, sore throat, dysphagia, dental problems, itching, sneezing,  nasal congestion or discharge of excess mucus or purulent secretions, ear ache,   fever, chills, sweats, unintended wt loss or wt gain, classically   exertional cp,  orthopnea pnd or arm/hand swelling  or leg swelling, presyncope, palpitations, abdominal pain, anorexia, nausea, vomiting, diarrhea  or change in bowel habits or change in bladder habits, change in stools or change in urine, dysuria, hematuria,  rash, arthralgias, visual complaints, headache, numbness, weakness or ataxia or problems with walking or coordination,  change in mood or  memory.        Current Meds  Medication Sig  . acetaminophen (TYLENOL) 325 MG tablet Take 2 tablets (650 mg total) by mouth every 4 (four) hours as needed for mild pain, moderate pain, fever or headache.  . albuterol (PROVENTIL HFA;VENTOLIN HFA) 108 (90 Base) MCG/ACT inhaler Inhale 1 puff into the lungs every 6 (six) hours as needed for wheezing or shortness of breath.  . ALPRAZolam (XANAX) 0.25 MG tablet Take 0.25 tablets by mouth daily as needed.  Marland Kitchen  amitriptyline (ELAVIL) 100 MG tablet Take 50 mg by mouth at bedtime.   Marland Kitchen amLODipine (NORVASC) 5 MG tablet Take 5 mg by mouth daily.   Marland Kitchen aspirin 81 MG EC tablet Take 81 mg by mouth daily.   Marland Kitchen atenolol (TENORMIN) 50 MG tablet Take 50 mg by mouth daily.  Marland Kitchen atorvastatin (LIPITOR) 40 MG tablet Take 20 mg by mouth daily.   . cetirizine (ZYRTEC) 10 MG tablet Take 10 mg  by mouth daily.  . colchicine 0.6 MG tablet Take 0.6 mg by mouth daily as needed.   . docusate sodium (COLACE) 100 MG capsule You can buy this at any drug store, over the counter and follow package instructions  . EPINEPHrine 0.3 mg/0.3 mL IJ SOAJ injection Inject 0.3 mLs into the muscle once as needed. Allergic reaction  . ezetimibe (ZETIA) 10 MG tablet Take 10 mg by mouth daily.  Marland Kitchen losartan-hydrochlorothiazide (HYZAAR) 50-12.5 MG tablet Take 1 tablet by mouth daily.  . Multiple Vitamins-Minerals (PRESERVISION AREDS 2) CAPS Take 2 tablets by mouth 2 (two) times daily.  Marland Kitchen saccharomyces boulardii (FLORASTOR) 250 MG capsule You can buy this over the counter at any drugstore and use for the next month.                       Objective:   Physical Exam  amb wf nad   12/05/2017        150  08/31/2017        154  07/22/2017        153   08/17/12 157 lb 9.6 oz (71.487 kg)  05/04/11 156 lb (70.761 kg)  04/20/11 156 lb 6.4 oz (70.943 kg)      Vital signs reviewed - Note on arrival 02 sats  95% on RA      HEENT: nl   turbinates bilaterally, and oropharynx. Nl external ear canals without cough reflex/ full dentures   NECK :  without JVD/Nodes/TM/ nl carotid upstrokes bilaterally   LUNGS: no acc muscle use,  Nl contour chest with scattered insp/ exp rhonchi  bilaterally without cough on insp or exp maneuvers   CV:  RRR  no s3 or murmur or increase in P2, and no edema   ABD:  soft and nontender with nl inspiratory excursion in the supine position. No bruits or organomegaly appreciated, bowel sounds nl  MS:  Nl gait/ ext  warm without deformities, calf tenderness, cyanosis or clubbing No obvious joint restrictions   SKIN: warm and dry without lesions    NEURO:  alert, approp, nl sensorium with  no motor or cerebellar deficits apparent.       no cxr's avail for dx of pna/ apparently made without cxr               Assessment & Plan:

## 2017-12-06 ENCOUNTER — Encounter: Payer: Self-pay | Admitting: Internal Medicine

## 2017-12-06 NOTE — Assessment & Plan Note (Signed)
-   PFT's FEV1 FEV1  2.32 (93%) ratio 68 with no change p B2, DLCO 62 with correcttion to 64% - PFT's  08/31/2017  FEV1 2.09  (90 % ) ratio 71  p no % improvement from saba p nothing  prior to study with DLCO  57 % corrects to 66 % for alv volume    Now that not smoking does not  actually meet the criteria for copd so is better characterized as a GOLD 0 so  rec continue prn saba, cig avoidance

## 2017-12-06 NOTE — Assessment & Plan Note (Signed)
Mild bronchiectatic change in the right middle lobe and  lingula. In view of these changes, MAC would be a consideration - HRCT 10/13/2017  1. Scattered mild cylindrical and varicoid bronchiectasis, tree-in-bud opacities and mucoid impaction in both lungs, predominantly in the upper lobes and right middle lobe, mildly progressed since 10/06/2016 chest CT, particularly in anterior left upper lobe. 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 2. Moderate centrilobular emphysema with diffuse bronchial wall thickening, suggesting COPD.   Her present flare may not actually have anything to do with dx of bronchiectasis or copd and may be due to ongoing cough from sinusitis so rx as augmentin x 10 days then ct sinus and f/u cxr on return if cp not 100% gone .

## 2017-12-06 NOTE — Assessment & Plan Note (Signed)
Note there are no areals of bronchiectasis in the area of her lungs correlating to the new vaguely pleuritic cp dx as pna nor where there cxr's done at that ov so rec f/u with cxr next ov p  Complete rx with augmentin if the cp is not 100% resolved at that point  I had an extended discussion with the patient/daughterreviewing all relevant studies completed to date and  lasting 15 to 20 minutes of a 25 minute visit    Pathophysiology of bronchiectasis reviewed for a second time using the same analogy = broken escalator   Each maintenance medication was reviewed in detail including most importantly the difference between maintenance and prns and under what circumstances the prns are to be triggered using an action plan format that is not reflected in the computer generated alphabetically organized AVS.     Please see AVS for specific instructions unique to this visit that I personally wrote and verbalized to the the pt in detail and then reviewed with pt  by my nurse highlighting any  changes in therapy recommended at today's visit to their plan of care.

## 2017-12-13 ENCOUNTER — Encounter: Payer: Self-pay | Admitting: Vascular Surgery

## 2017-12-13 ENCOUNTER — Ambulatory Visit (INDEPENDENT_AMBULATORY_CARE_PROVIDER_SITE_OTHER): Payer: Medicare Other | Admitting: Vascular Surgery

## 2017-12-13 ENCOUNTER — Other Ambulatory Visit: Payer: Self-pay

## 2017-12-13 VITALS — BP 146/69 | HR 84 | Resp 18 | Ht 67.0 in | Wt 153.0 lb

## 2017-12-13 DIAGNOSIS — I6523 Occlusion and stenosis of bilateral carotid arteries: Secondary | ICD-10-CM

## 2017-12-13 NOTE — Progress Notes (Signed)
Patient name: Heidi Hobbs MRN: [id number] DOB: June 11, 1940 Sex: female  REASON FOR VISIT: Carotid surveillance with interval six-month follow-up  HPI: Heidi Hobbs is a 77 y.o. female with multiple medical comorbidities that presents for interval six-month follow-up for carotid surveillance as well as ABIs.  Regarding her carotid disease we have been following her right carotid where she has had elevated velocities consistent with a 40 to 60% stenosis.  She denies any history of TIAs or strokes in the interim.  Over the last 6 months she has had no new numbness weakness or other neurologic changes.  Continues to smoke.  Takes aspirin and statin.  Having worsening worsening macular degeneration in her eye.  Regarding her legs she is having no claudication, rest pain, or tissue loss at this time.  Past Medical History:  Diagnosis Date  . Adenomatous colon polyp 1995, 2011  . Anxiety   . Carotid stenosis    40-59% right, less than 40% left  . Cataracts, bilateral   . COPD (chronic obstructive pulmonary disease) ([place])   . Essential hypertension 02/13/2016  . Hyperlipidemia   . Hypertension   . MAC (mycobacterium avium-intracellulare complex)   . Macular degeneration, dry 02/13/2016  . Polio 1959  . Stomach ulcer 1960  . 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 Vasculitis ([place]) 02/13/2016   Followed by [facility]     Past Surgical History:  Procedure Laterality Date  . BREAST ENHANCEMENT SURGERY     removal  . CATARACT EXTRACTION    . COLONOSCOPY    . COLONOSCOPY  10/14/09  . LAPAROSCOPIC APPENDECTOMY N/A 01/23/2016   Procedure: APPENDECTOMY LAPAROSCOPIC;  Surgeon: Erroll Luna, MD;  Location: [facility];  Service: General;  Laterality: N/A;  . POLYPECTOMY  2011   x8  . TONSILLECTOMY AND ADENOIDECTOMY    . VAGINAL HYSTERECTOMY  1970   uterine cancer    Family History  Problem Relation Age of Onset  . Colon cancer Father   . Esophageal cancer  Brother   . Esophageal cancer Brother   . Asthma Daughter        as a child  . Stomach cancer Neg Hx   . Rectal cancer Neg Hx     SOCIAL HISTORY: Social History   Tobacco Use  . Smoking status: Former Smoker    Packs/day: 0.50    Years: 50.00    Pack years: 25.00    Types: Cigarettes    Last attempt to quit: 08/01/2017    Years since quitting: 0.3  . Smokeless tobacco: Never Used  Substance Use Topics  . Alcohol use: No    Alcohol/week: 0.0 standard drinks    Allergies  Allergen Reactions  . Eggs Or Egg-Derived Products     Skin nodules    Current Outpatient Medications  Medication Sig Dispense Refill  . acetaminophen (TYLENOL) 325 MG tablet Take 2 tablets (650 mg total) by mouth every 4 (four) hours as needed for mild pain, moderate pain, fever or headache.    . albuterol (PROVENTIL HFA;VENTOLIN HFA) 108 (90 Base) MCG/ACT inhaler Inhale 1 puff into the lungs every 6 (six) hours as needed for wheezing or shortness of breath.    . ALPRAZolam (XANAX) 0.25 MG tablet Take 0.25 tablets by mouth daily as needed.  1  . amitriptyline (ELAVIL) 100 MG tablet Take 50 mg by mouth at bedtime.     Marland Kitchen amLODipine (NORVASC) 5 MG tablet Take 5 mg by mouth daily.     Marland Kitchen  amoxicillin-clavulanate (AUGMENTIN) 875-125 MG tablet Take 1 tablet by mouth 2 (two) times daily for 10 days. 20 tablet 0  . aspirin 81 MG EC tablet Take 81 mg by mouth daily.     Marland Kitchen atenolol (TENORMIN) 50 MG tablet Take 50 mg by mouth daily.    Marland Kitchen atorvastatin (LIPITOR) 40 MG tablet Take 20 mg by mouth daily.     . cetirizine (ZYRTEC) 10 MG tablet Take 10 mg by mouth daily.    . colchicine 0.6 MG tablet Take 0.6 mg by mouth daily as needed.     . docusate sodium (COLACE) 100 MG capsule You can buy this at any drug store, over the counter and follow package instructions 10 capsule 0  . EPINEPHrine 0.3 mg/0.3 mL IJ SOAJ injection Inject 0.3 mLs into the muscle once as needed. Allergic reaction    . ezetimibe (ZETIA) 10 MG tablet  Take 10 mg by mouth daily.    Marland Kitchen losartan-hydrochlorothiazide (HYZAAR) 50-12.5 MG tablet Take 1 tablet by mouth daily.  0  . Multiple Vitamins-Minerals (PRESERVISION AREDS 2) CAPS Take 2 tablets by mouth 2 (two) times daily.    Marland Kitchen saccharomyces boulardii (FLORASTOR) 250 MG capsule You can buy this over the counter at any drugstore and use for the next month.     No current facility-administered medications for this visit.     REVIEW OF SYSTEMS:  _0  denotes positive finding, _1  denotes negative finding Cardiac  Comments:  Chest pain or chest pressure:    Shortness of breath upon exertion:    Short of breath when lying flat:    Irregular heart rhythm:        Vascular    Pain in calf, thigh, or hip brought on by ambulation:    Pain in feet at night that wakes you up from your sleep:     Blood clot in your veins:    Leg swelling:         Pulmonary    Oxygen at home:    Productive cough:     Wheezing:         Neurologic    Sudden weakness in arms or legs:     Sudden numbness in arms or legs:     Sudden onset of difficulty speaking or slurred speech:    Temporary loss of vision in one eye:     Problems with dizziness:         Gastrointestinal    Blood in stool:     Vomited blood:         Genitourinary    Burning when urinating:     Blood in urine:        Psychiatric    Major depression:         Hematologic    Bleeding problems:    Problems with blood clotting too easily:        Skin    Rashes or ulcers:        Constitutional    Fever or chills:      PHYSICAL EXAM: Vitals:   12/13/17 1513 12/13/17 1515  BP: (!) 156/77 (!) 146/69  Pulse: 84   Resp: 18   SpO2: 95%   Weight: 69.4 kg   Height: _2  (1.702 m)     GENERAL: The patient is a well-nourished female, in no acute distress. The vital signs are documented above. CARDIAC: There is a regular rate and rhythm.  VASCULAR:  Palpable femoral pulses. Monophasic signals  in L DP/PT Biphasic signal in R  DP/PR PULMONARY: There is good air exchange bilaterally without wheezing or rales. ABDOMEN: Soft and non-tender with normal pitched bowel sounds.  MUSCULOSKELETAL: There are no major deformities or cyanosis. NEUROLOGIC: No focal weakness or paresthesias are detected. CN II-XII grossly intact. SKIN: There are no ulcers or rashes noted. PSYCHIATRIC: The patient has a normal affect.  DATA:   Dependent reviewed her noninvasive imaging which shows an ABI of 0.9 in the right lower extremity with biphasic waveform and ABI 0.88 in the left lower extremity with a monophasic waveform.    Her right ICA velocity is now 303/43.  Left ICA velocity is 92/18  Assessment/Plan:  She remains asymptomatic from her carotid disease.  Her velocities have increased slightly in the right ICA and are now 303/43 most likely consistent with a moderate stenosis (previously 258/46).  Discussed I will plan to see her back in 6 months with a repeat carotid duplex at that time.  For asymptomatic disease we typically intervene at greater than 80% stenosis.  In the meantime she should continue optimal medical management including aspirin and statin which she is already taking.  Discussed smoking cessation with her. She is having no symptoms in her legs including no claudication, no rest pain, and no tissue loss and I see no role to repeat noninvasive imaging unless she develops symptoms in the future.  Marty Heck, MD [facility] Office: [phone] Pager: [place]

## 2017-12-20 ENCOUNTER — Inpatient Hospital Stay: Admission: RE | Admit: 2017-12-20 | Payer: Medicare Other | Source: Ambulatory Visit

## 2018-01-02 ENCOUNTER — Ambulatory Visit (INDEPENDENT_AMBULATORY_CARE_PROVIDER_SITE_OTHER)
Admission: RE | Admit: 2018-01-02 | Discharge: 2018-01-02 | Disposition: A | Discharge status: Home / Self Care | Payer: Medicare Other | Source: Ambulatory Visit | Attending: Internal Medicine | Admitting: Internal Medicine

## 2018-01-02 DIAGNOSIS — R05 Cough: Secondary | ICD-10-CM

## 2018-01-02 DIAGNOSIS — R059 Cough, unspecified: Secondary | ICD-10-CM

## 2018-01-02 DIAGNOSIS — J479 Bronchiectasis, uncomplicated: Secondary | ICD-10-CM

## 2018-01-03 DIAGNOSIS — H353221 Exudative age-related macular degeneration, left eye, with active choroidal neovascularization: Secondary | ICD-10-CM | POA: Diagnosis not present

## 2018-01-03 NOTE — Progress Notes (Signed)
Spoke with pt and notified of results per Dr. Wert. Pt verbalized understanding and denied any questions. 

## 2018-01-04 ENCOUNTER — Encounter: Payer: Self-pay | Admitting: Internal Medicine

## 2018-01-04 ENCOUNTER — Ambulatory Visit (INDEPENDENT_AMBULATORY_CARE_PROVIDER_SITE_OTHER)
Admission: RE | Admit: 2018-01-04 | Discharge: 2018-01-04 | Disposition: A | Discharge status: Home / Self Care | Payer: Medicare Other | Source: Ambulatory Visit | Attending: Internal Medicine | Admitting: Internal Medicine

## 2018-01-04 ENCOUNTER — Ambulatory Visit (INDEPENDENT_AMBULATORY_CARE_PROVIDER_SITE_OTHER): Payer: Medicare Other | Admitting: Internal Medicine

## 2018-01-04 VITALS — BP 146/82 | HR 74 | Ht 67.0 in | Wt 153.0 lb

## 2018-01-04 DIAGNOSIS — J984 Other disorders of lung: Secondary | ICD-10-CM | POA: Diagnosis not present

## 2018-01-04 DIAGNOSIS — J479 Bronchiectasis, uncomplicated: Secondary | ICD-10-CM | POA: Diagnosis not present

## 2018-01-04 MED ORDER — AMOXICILLIN-POT CLAVULANATE 875-125 MG PO TABS: 1.0000 | ORAL_TABLET | Freq: Two times a day (BID) | ORAL | 2 refills | Status: AC

## 2018-01-04 NOTE — Patient Instructions (Addendum)
For cough / congestion >  mucinex or mucinex dm up to 1200 mg every 12 hours as needed  For nasty mucus  >  Augmentin 875 mg take one pill twice daily  X 10 days - take at breakfast and supper with large glass of water.  It would help reduce the usual side effects (diarrhea and yeast infections) if you ate cultured yogurt at lunch.    Please remember to go to the x-ray department downstairs in the basement  for your tests - we will call you with the results when they are available.      Please schedule a follow up visit in 3 months but call sooner if needed

## 2018-01-04 NOTE — Progress Notes (Signed)
Subjective:   Patient ID: Heidi Hobbs, female    DOB: 1940-05-26     MRN: [id number]   Brief patient profile:  56 yowf quit smoking 07/2017 with  Chronic cough since 1980 limited by sob then breathing worse in April 2014 while being being treated by  Dr Fredderick Phenix  For hives, rx with prn Ventolin but didn't help resting sob and dx with bronchiectasis and GOLD I copd criteria 09/27/12   H/o Exposure to chlorine gas in 1980's did not required hospitalization > w/in  Few years started getting recurrent pna  Has immunodef f/u by [facility]    History of Present Illness  08/17/2012 1st pulmonary cc indolent cough x sev years better when stops smoking, not limited by breathing. Cough is mostly clear more p shower, and in ams also but doesn't disturb sleep. rec Mucinex or mucinex dm prn     12/05/2017  f/u ov/Wert re: COPD GOLD I/ bronchiectasis  Chief Complaint  Patient presents with  . Follow-up    went to UC approx 4 wks ago and was told she had PNA-txed with pred and doxy. She states her breathing is doing okay. She is having some left side back pain. She rareley uses her albuterol.   Dyspnea:  MMRC2 = can't walk a nl pace on a flat grade s sob but does fine slow and flat  Cough: variable cough yellow and green better doxy with assoc sinus congestion, some worse since stopped doxy as is  assoc L post cp  Sleeping: one pillow bed flat  SABA use: rarely   rec Augmentin 875 mg take one pill twice daily  X 10 days - take at breakfast and supper with large glass of water.  It would help reduce the usual side effects (diarrhea and yeast infections) if you ate cultured yogurt at lunch.  Bronchiectasis =   you have scarring of your bronchial tubes  Whenever you develop cough congestion take mucinex or mucinex dm  1200 mg every 12 hours > these will help keep the mucus loose  sinus CT in 2 weeks ->>> all clear   01/04/2018  f/u ov/Wert re: bronchiectasis / better p course of augmentin  Chief  Complaint  Patient presents with  . Follow-up    Cough has improved  some.  She occ produces some yellow sputum.  She rarely uses her albuterol inhaler.   Dyspnea:  mmrc2 Cough: first thing in am  Sleeping: 2 pillow /flat bed  SABA use: rarely  02: none    No obvious day to day or daytime variability or assoc excess/ purulent sputum or mucus plugs or hemoptysis or cp or chest tightness, subjective wheeze or overt sinus or hb symptoms.   Sleeping  without nocturnal  or early am exacerbation  of respiratory  c/o's or need for noct saba. Also denies any obvious fluctuation of symptoms with weather or environmental changes or other aggravating or alleviating factors except as outlined above   No unusual exposure hx or h/o childhood pna/ asthma or knowledge of premature birth.  Current Allergies, Complete Past Medical History, Past Surgical History, Family History, and Social History were reviewed in [employer] record.  ROS  The following are not active complaints unless bolded Hoarseness, sore throat, dysphagia, dental problems, itching, sneezing,  nasal congestion or discharge of excess mucus or purulent secretions, ear ache,   fever, chills, sweats, unintended wt loss or wt gain, classically pleuritic or exertional cp,  orthopnea pnd  or arm/hand swelling  or leg swelling, presyncope, palpitations, abdominal pain, anorexia, nausea, vomiting, diarrhea  or change in bowel habits or change in bladder habits, change in stools or change in urine, dysuria, hematuria,  rash, arthralgias, visual complaints, headache, numbness, weakness or ataxia or problems with walking or coordination,  change in mood or  memory.        Current Meds  Medication Sig  . acetaminophen (TYLENOL) 325 MG tablet Take 2 tablets (650 mg total) by mouth every 4 (four) hours as needed for mild pain, moderate pain, fever or headache.  . albuterol (PROVENTIL HFA;VENTOLIN HFA) 108 (90 Base) MCG/ACT inhaler  Inhale 1 puff into the lungs every 6 (six) hours as needed for wheezing or shortness of breath.  . ALPRAZolam (XANAX) 0.25 MG tablet Take 0.25 tablets by mouth daily as needed.  Marland Kitchen amitriptyline (ELAVIL) 100 MG tablet Take 50 mg by mouth at bedtime.   Marland Kitchen amLODipine (NORVASC) 5 MG tablet Take 5 mg by mouth daily.   Marland Kitchen aspirin 81 MG EC tablet Take 81 mg by mouth daily.   Marland Kitchen atenolol (TENORMIN) 50 MG tablet Take 50 mg by mouth daily.  Marland Kitchen atorvastatin (LIPITOR) 40 MG tablet Take 20 mg by mouth daily.   . cetirizine (ZYRTEC) 10 MG tablet Take 10 mg by mouth daily.  . colchicine 0.6 MG tablet Take 0.6 mg by mouth daily as needed.   . docusate sodium (COLACE) 100 MG capsule You can buy this at any drug store, over the counter and follow package instructions  . EPINEPHrine 0.3 mg/0.3 mL IJ SOAJ injection Inject 0.3 mLs into the muscle once as needed. Allergic reaction  . ezetimibe (ZETIA) 10 MG tablet Take 10 mg by mouth daily.  Marland Kitchen losartan-hydrochlorothiazide (HYZAAR) 50-12.5 MG tablet Take 1 tablet by mouth daily.  . Multiple Vitamins-Minerals (PRESERVISION AREDS 2) CAPS Take 2 tablets by mouth 2 (two) times daily.  Marland Kitchen saccharomyces boulardii (FLORASTOR) 250 MG capsule You can buy this over the counter at any drugstore and use for the next month.             Objective:   Physical Exam  amb nasal tone to voice   01/04/2018      153  12/05/2017        150  08/31/2017        154  07/22/2017        153   08/17/12 157 lb 9.6 oz (71.487 kg)  05/04/11 156 lb (70.761 kg)  04/20/11 156 lb 6.4 oz (70.943 kg)     Vital signs reviewed - Note on arrival 02 sats  96% on RA       HEENT: Full dentures/ nl turbinates bilaterally, and oropharynx. Nl external ear canals without cough reflex   NECK :  without JVD/Nodes/TM/ nl carotid upstrokes bilaterally   LUNGS: no acc muscle use,  Nl contour chest with  scattered insp/exp rhonchi bilaterally without cough on insp or exp maneuvers   CV:  RRR  no s3 or  murmur or increase in P2, and no edema   ABD:  soft and nontender with nl inspiratory excursion in the supine position. No bruits or organomegaly appreciated, bowel sounds nl  MS:  Nl gait/ ext warm without deformities, calf tenderness, cyanosis or clubbing No obvious joint restrictions   SKIN: warm and dry without lesions    NEURO:  alert, approp, nl sensorium with  no motor or cerebellar deficits apparent.  CXR PA and Lateral:   01/04/2018 :    I personally reviewed images and agree with radiology impression as follows:   Chronic changes without acute abnormality.        Assessment & Plan:

## 2018-01-06 ENCOUNTER — Encounter: Payer: Self-pay | Admitting: Internal Medicine

## 2018-01-06 NOTE — Assessment & Plan Note (Addendum)
01/28/12 CT chest Mild bronchiectatic change in the right middle lobe and  lingula. In view of these changes, MAC would be a consideration - HRCT 10/13/2017  1. Scattered mild cylindrical and varicoid bronchiectasis, tree-in-bud opacities and mucoid impaction in both lungs, predominantly in the upper lobes and right middle lobe, mildly progressed since 10/06/2016 chest CT, particularly in anterior left upper lobe. 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 2. Moderate centrilobular emphysema with diffuse bronchial wall thickening, suggesting COPD. - Sinus CT 01/02/18 > Clear paranasal sinuses  - 01/04/2018 added prn augmentin for flares   - The proper method of use, as well as anticipated side effects, of a flutter valve  were discussed and demonstrated to the patient.    She is much better without sinus dz as suspected so just needs to use augmentin for flares for now and mucinex prn   In the absence of plain cxr changes or refractory symptoms see no role here for fob or escalation of care.  Discussed in detail all the  indications, usual  risks and alternatives  relative to the benefits with patient who agrees to proceed with conservative f/u as outlined    See device teaching which extended face to face time for this visit (flutter valve training)  I had an extended discussion with the patient reviewing all relevant studies completed to date and  lasting 15 to 20 minutes of a 25 minute visit    Each maintenance medication was reviewed in detail including most importantly the difference between maintenance and prns and under what circumstances the prns are to be triggered using an action plan format that is not reflected in the computer generated alphabetically organized AVS.     Please see AVS for specific instructions unique to this visit that I personally wrote and verbalized to the the pt in detail and then reviewed with pt  by  my nurse highlighting any  changes in therapy recommended at today's visit to their plan of care.

## 2018-01-16 DIAGNOSIS — M545 Low back pain: Secondary | ICD-10-CM | POA: Diagnosis not present

## 2018-01-16 DIAGNOSIS — D519 Vitamin B12 deficiency anemia, unspecified: Secondary | ICD-10-CM | POA: Diagnosis not present

## 2018-01-16 DIAGNOSIS — J449 Chronic obstructive pulmonary disease, unspecified: Secondary | ICD-10-CM | POA: Diagnosis not present

## 2018-01-16 DIAGNOSIS — D229 Melanocytic nevi, unspecified: Secondary | ICD-10-CM | POA: Diagnosis not present

## 2018-01-16 DIAGNOSIS — I10 Essential (primary) hypertension: Secondary | ICD-10-CM | POA: Diagnosis not present

## 2018-02-03 DIAGNOSIS — H353231 Exudative age-related macular degeneration, bilateral, with active choroidal neovascularization: Secondary | ICD-10-CM | POA: Diagnosis not present

## 2018-02-28 DIAGNOSIS — D225 Melanocytic nevi of trunk: Secondary | ICD-10-CM | POA: Diagnosis not present

## 2018-02-28 DIAGNOSIS — L821 Other seborrheic keratosis: Secondary | ICD-10-CM | POA: Diagnosis not present

## 2018-03-21 DIAGNOSIS — H353231 Exudative age-related macular degeneration, bilateral, with active choroidal neovascularization: Secondary | ICD-10-CM | POA: Diagnosis not present

## 2018-03-23 DIAGNOSIS — M859 Disorder of bone density and structure, unspecified: Secondary | ICD-10-CM | POA: Diagnosis not present

## 2018-04-07 ENCOUNTER — Ambulatory Visit (INDEPENDENT_AMBULATORY_CARE_PROVIDER_SITE_OTHER): Payer: Medicare Other | Admitting: Internal Medicine

## 2018-04-07 ENCOUNTER — Encounter: Payer: Self-pay | Admitting: Internal Medicine

## 2018-04-07 VITALS — BP 132/64 | HR 76 | Ht 67.0 in | Wt 156.0 lb

## 2018-04-07 DIAGNOSIS — J479 Bronchiectasis, uncomplicated: Secondary | ICD-10-CM

## 2018-04-07 DIAGNOSIS — R300 Dysuria: Secondary | ICD-10-CM | POA: Diagnosis not present

## 2018-04-07 DIAGNOSIS — J449 Chronic obstructive pulmonary disease, unspecified: Secondary | ICD-10-CM | POA: Diagnosis not present

## 2018-04-07 MED ORDER — ALBUTEROL SULFATE HFA 108 (90 BASE) MCG/ACT IN AERS: 1.0000 | INHALATION_SPRAY | Freq: Four times a day (QID) | RESPIRATORY_TRACT | 1 refills | Status: DC | PRN

## 2018-04-07 MED ORDER — CIPROFLOXACIN HCL 500 MG PO TABS: 500.0000 mg | ORAL_TABLET | Freq: Two times a day (BID) | ORAL | 0 refills | Status: DC

## 2018-04-07 NOTE — Progress Notes (Signed)
Subjective:   Patient ID: Heidi Hobbs, female    DOB: 1941/03/06     MRN: [id number]   Brief patient profile:  63 yowf quit smoking 07/2017 with  Chronic cough since 1980 limited by sob then breathing worse in April 2014 while being being treated by  Dr Fredderick Phenix  For hives, rx with prn Ventolin but didn't help resting sob and dx with bronchiectasis and GOLD I copd criteria 09/27/12   H/o Exposure to chlorine gas in 1980's did not required hospitalization > w/in  Few years started getting recurrent pna  Stopped taking daily macrodantin around 03/2017  Has immunodef f/u by [facility]   History of Present Illness  08/17/2012 1st pulmonary cc indolent cough x sev years better when stops smoking, not limited by breathing. Cough is mostly clear more p shower, and in ams also but doesn't disturb sleep. rec Mucinex or mucinex dm prn     12/05/2017  f/u ov/Heidi Hobbs re: COPD GOLD I/ bronchiectasis  Chief Complaint  Patient presents with  . Follow-up    went to UC approx 4 wks ago and was told she had PNA-txed with pred and doxy. She states her breathing is doing okay. She is having some left side back pain. She rareley uses her albuterol.   Dyspnea:  MMRC2 = can't walk a nl pace on a flat grade s sob but does fine slow and flat  Cough: variable cough yellow and green better doxy with assoc sinus congestion, some worse since stopped doxy as is  assoc L post cp  Sleeping: one pillow bed flat  SABA use: rarely   rec Augmentin 875 mg take one pill twice daily  X 10 days - take at breakfast and supper with large glass of water.  It would help reduce the usual side effects (diarrhea and yeast infections) if you ate cultured yogurt at lunch.  Bronchiectasis =   you have scarring of your bronchial tubes  Whenever you develop cough congestion take mucinex or mucinex dm  1200 mg every 12 hours > these will help keep the mucus loose  sinus CT in 2 weeks ->>> all clear   01/04/2018  f/u ov/Heidi Hobbs re:  bronchiectasis / better p course of augmentin  Chief Complaint  Patient presents with  . Follow-up    Cough has improved  some.  She occ produces some yellow sputum.  She rarely uses her albuterol inhaler.   Dyspnea:  mmrc2 Cough: first thing in am  Sleeping: 2 pillow /flat bed  SABA use: rarely  02: none  rec For cough / congestion >  mucinex or mucinex dm up to 1200 mg every 12 hours as needed For nasty mucus  >  Augmentin 875 mg take one pill twice daily  X 10 days - take at breakfast and supper with large glass of water.  It would help reduce the usual side effects (diarrhea and yeast infections) if you ate cultured yogurt at lunch.     04/07/2018  f/u ov/Heidi Hobbs re: bronchiectasis / just started back on macrodantin Chief Complaint  Patient presents with  . Follow-up    Breathing is doing well. She is coughing alot at night- admits to eating alot of chocolate at night.   Dyspnea:  MMRC2 = can't walk a nl pace on a flat grade s sob but does fine slow and flat ht ok  Cough: worse at hs and in am eating lots of kisses at bedside Sleeping: flat bed  SABA use:  none  02: none    No obvious day to day or daytime variability or assoc   purulent sputum or mucus plugs or hemoptysis or cp or chest tightness, subjective wheeze or overt sinus or hb symptoms.     Also denies any obvious fluctuation of symptoms with weather or environmental changes or other aggravating or alleviating factors except as outlined above   No unusual exposure hx or h/o childhood pna/ asthma or knowledge of premature birth.  Current Allergies, Complete Past Medical History, Past Surgical History, Family History, and Social History were reviewed in [employer] record.  ROS  The following are not active complaints unless bolded Hoarseness, sore throat, dysphagia, dental problems, itching, sneezing,  nasal congestion or discharge of excess mucus or purulent secretions, ear ache,   fever, chills,  sweats, unintended wt loss or wt gain, classically pleuritic or exertional cp,  orthopnea pnd or arm/hand swelling  or leg swelling, presyncope, palpitations, abdominal pain, anorexia, nausea, vomiting, diarrhea  or change in bowel habits or change in bladder habits, change in stools or change in urine, dysuria, hematuria,  rash, arthralgias, visual complaints, headache, numbness, weakness or ataxia or problems with walking or coordination,  change in mood or  memory.        Current Meds  Medication Sig  . acetaminophen (TYLENOL) 325 MG tablet Take 2 tablets (650 mg total) by mouth every 4 (four) hours as needed for mild pain, moderate pain, fever or headache.  . albuterol (PROVENTIL HFA;VENTOLIN HFA) 108 (90 Base) MCG/ACT inhaler Inhale 1 puff into the lungs every 6 (six) hours as needed for wheezing or shortness of breath.  . ALPRAZolam (XANAX) 0.25 MG tablet Take 0.25 tablets by mouth daily as needed.  Marland Kitchen amitriptyline (ELAVIL) 100 MG tablet Take 50 mg by mouth at bedtime.   Marland Kitchen amLODipine (NORVASC) 5 MG tablet Take 5 mg by mouth daily.   Marland Kitchen aspirin 81 MG EC tablet Take 81 mg by mouth daily.   Marland Kitchen atenolol (TENORMIN) 50 MG tablet Take 50 mg by mouth daily.  Marland Kitchen atorvastatin (LIPITOR) 40 MG tablet Take 20 mg by mouth daily.   . cetirizine (ZYRTEC) 10 MG tablet Take 10 mg by mouth daily.  . colchicine 0.6 MG tablet Take 0.6 mg by mouth daily as needed.   . docusate sodium (COLACE) 100 MG capsule You can buy this at any drug store, over the counter and follow package instructions  . EPINEPHrine 0.3 mg/0.3 mL IJ SOAJ injection Inject 0.3 mLs into the muscle once as needed. Allergic reaction  . ezetimibe (ZETIA) 10 MG tablet Take 10 mg by mouth daily.  . hydrochlorothiazide (HYDRODIURIL) 50 MG tablet Take 50 mg by mouth daily.  Marland Kitchen losartan (COZAAR) 100 MG tablet Take 100 mg by mouth daily.  . Multiple Vitamins-Minerals (PRESERVISION AREDS 2) CAPS Take 2 tablets by mouth 2 (two) times daily.  Marland Kitchen  saccharomyces boulardii (FLORASTOR) 250 MG capsule You can buy this over the counter at any drugstore and use for the next month.             Objective:   Physical Exam  amb wf nad  04/07/2018        156  01/04/2018      153  12/05/2017        150  08/31/2017        154  07/22/2017        153   08/17/12 157 lb 9.6 oz (71.487 kg)  05/04/11 156 lb (70.761 kg)  04/20/11 156 lb 6.4 oz (70.943 kg)     Vital signs reviewed - Note on arrival 02 sats  95% on RA      HEENT: Full dentures/ nl  turbinates bilaterally, and oropharynx. Nl external ear canals without cough reflex   NECK :  without JVD/Nodes/TM/ nl carotid upstrokes bilaterally   LUNGS: no acc muscle use,  Nl contour chest with scattered insp/ exp rhonchi bilaterally  without cough on insp or exp maneuvers   CV:  RRR  no s3 or murmur or increase in P2, and no edema   ABD:  soft and nontender with nl inspiratory excursion in the supine position. No bruits or organomegaly appreciated, bowel sounds nl  MS:  Nl gait/ ext warm without deformities, calf tenderness, cyanosis or clubbing No obvious joint restrictions   SKIN: warm and dry without lesions    NEURO:  alert, approp, nl sensorium with  no motor or cerebellar deficits apparent.            Assessment & Plan:

## 2018-04-07 NOTE — Patient Instructions (Addendum)
cipro 500 mg twice daily with meals x one week and stop the macrodantin    Cough > mucinex 1200 mg every 12 hours as needed and flutter valve as much as possible    6-8 in bed blocks very important - see below    GERD (REFLUX)  is an extremely common cause of respiratory symptoms just like yours , many times with no obvious heartburn at all.    It can be treated with medication, but also with lifestyle changes including elevation of the head of your bed (ideally with 6 -8inch blocks under the headboard of your bed),  Smoking cessation, avoidance of late meals, excessive alcohol, and avoid fatty foods, chocolate, peppermint, colas, red wine, and acidic juices such as orange juice.  NO MINT OR MENTHOL PRODUCTS SO NO COUGH DROPS  USE SUGARLESS CANDY INSTEAD (Jolley ranchers or Stover's or Life Savers) or even ice chips will also do - the key is to swallow to prevent all throat clearing. NO OIL BASED VITAMINS - use powdered substitutes.  Avoid fish oil when coughing.     Please schedule a follow up visit in 3 months but call sooner if needed  with all medications /inhalers/ solutions in hand so we can verify exactly what you are taking. This includes all medications from all doctors and over the counters

## 2018-04-08 ENCOUNTER — Encounter: Payer: Self-pay | Admitting: Internal Medicine

## 2018-04-08 DIAGNOSIS — R300 Dysuria: Secondary | ICD-10-CM | POA: Insufficient documentation

## 2018-04-08 NOTE — Assessment & Plan Note (Addendum)
Cough since 1980   01/28/12 CT chest:  Mild bronchiectatic change in the right middle lobe and  lingula. In view of these changes, MAC would be a consideration - HRCT 10/13/2017  1. Scattered mild cylindrical and varicoid bronchiectasis, tree-in-bud opacities and mucoid impaction in both lungs, predominantly in the upper lobes and right middle lobe, mildly progressed since 10/06/2016 chest CT, particularly in anterior left upper lobe. 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 2. Moderate centrilobular emphysema with diffuse bronchial wall thickening, suggesting COPD. - Sinus CT 01/02/18 > Clear paranasal sinuses - 01/04/2018 added prn augmentin for flares   - 04/07/2018 added flutter, stopped macrodantin   Although the cxr pattern is not typical of macrodantin lung injury, given the background chronic lung dz it is problematic to use an antibiotic with such a high relative risk of lung injury, especially chronically.   rec change to cipro 500 mg bid and notify urology / pcp rec avoid macrodantin in this setting   Also should not be eating chocolate, esp at hs, based on assoc of chocolate with gerd and gerd with bronchiectasis > also rec elevate hob/gerd diet for the same reason

## 2018-04-08 NOTE — Assessment & Plan Note (Addendum)
Quit smoking 07/2017   - PFT's  08/31/2017  FEV1 2.09  (90 % ) ratio 71  p no % improvement from saba p nothing  prior to study with DLCO  57 % corrects to 66 % for alv volume     - The proper method of use, as well as anticipated side effects, of a metered-dose inhaler are discussed and demonstrated to the patient.  Flutter valve use also demonstrated.      I reviewed the Fletcher curve with the patient that basically indicates  if you quit smoking when your best day FEV1 is still well preserved (as is clearly  the case here)  it is highly unlikely you will progress to severe disease and informed the patient there was  no medication on the market that has proven to alter the curve/ its downward trajectory  or the likelihood of progression of their disease(unlike other chronic medical conditions such as atheroclerosis where we do think we can change the natural hx with risk reducing meds)    Therefore stopping smoking and maintaining abstinence are  the most important aspects of care, not choice of inhalers or for that matter, doctors.   Treatment other than smoking cessation  is entirely directed by severity of symptoms and focused also on reducing exacerbations, not attempting to change the natural history of the disease.  Since not having exacerbations and fev1 > 90% predicted doubt rx with more than prn saba is needed here.

## 2018-04-08 NOTE — Assessment & Plan Note (Addendum)
D/c macrodantin permanently 04/07/2018   Advised needs to use alternative based on chronic lung dz > for this episode re cipro 500 mg bid x 7 days   I had an extended discussion with the patient reviewing all relevant studies completed to date and  lasting 15 to 20 minutes of a 25 minute visit    See device teaching (flutter and hfa) which extended face to face time for this visit.  Each maintenance medication was reviewed in detail including emphasizing most importantly the difference between maintenance and prns and under what circumstances the prns are to be triggered using an action plan format that is not reflected in the computer generated alphabetically organized AVS which I have not found useful in most complex patients, especially with respiratory illnesses  Please see AVS for specific instructions unique to this visit that I personally wrote and verbalized to the the pt in detail and then reviewed with pt  by my nurse highlighting any  changes in therapy recommended at today's visit to their plan of care.

## 2018-04-18 DIAGNOSIS — H353231 Exudative age-related macular degeneration, bilateral, with active choroidal neovascularization: Secondary | ICD-10-CM | POA: Diagnosis not present

## 2018-05-17 DIAGNOSIS — I10 Essential (primary) hypertension: Secondary | ICD-10-CM | POA: Diagnosis not present

## 2018-05-17 DIAGNOSIS — N390 Urinary tract infection, site not specified: Secondary | ICD-10-CM | POA: Diagnosis not present

## 2018-05-17 DIAGNOSIS — K635 Polyp of colon: Secondary | ICD-10-CM | POA: Diagnosis not present

## 2018-05-17 DIAGNOSIS — R102 Pelvic and perineal pain: Secondary | ICD-10-CM | POA: Diagnosis not present

## 2018-05-24 DIAGNOSIS — D3191 Benign neoplasm of unspecified part of right eye: Secondary | ICD-10-CM | POA: Diagnosis not present

## 2018-05-24 DIAGNOSIS — H353221 Exudative age-related macular degeneration, left eye, with active choroidal neovascularization: Secondary | ICD-10-CM | POA: Diagnosis not present

## 2018-05-24 DIAGNOSIS — H353211 Exudative age-related macular degeneration, right eye, with active choroidal neovascularization: Secondary | ICD-10-CM | POA: Diagnosis not present

## 2018-06-19 ENCOUNTER — Other Ambulatory Visit: Payer: Self-pay

## 2018-06-19 DIAGNOSIS — I6523 Occlusion and stenosis of bilateral carotid arteries: Secondary | ICD-10-CM

## 2018-06-27 ENCOUNTER — Ambulatory Visit: Payer: Medicare Other | Admitting: Vascular Surgery

## 2018-06-27 ENCOUNTER — Encounter (HOSPITAL_COMMUNITY): Payer: Medicare Other

## 2018-07-03 ENCOUNTER — Other Ambulatory Visit: Payer: Self-pay

## 2018-07-03 ENCOUNTER — Emergency Department (HOSPITAL_COMMUNITY): Payer: Medicare Other

## 2018-07-03 ENCOUNTER — Encounter (HOSPITAL_COMMUNITY): Payer: Self-pay

## 2018-07-03 ENCOUNTER — Emergency Department (HOSPITAL_COMMUNITY)
Admission: EM | Admit: 2018-07-03 | Discharge: 2018-07-03 | Disposition: A | Discharge status: Home / Self Care | Payer: Medicare Other | Attending: Emergency Medicine | Admitting: Emergency Medicine

## 2018-07-03 DIAGNOSIS — K449 Diaphragmatic hernia without obstruction or gangrene: Secondary | ICD-10-CM | POA: Diagnosis not present

## 2018-07-03 DIAGNOSIS — K5730 Diverticulosis of large intestine without perforation or abscess without bleeding: Secondary | ICD-10-CM | POA: Diagnosis not present

## 2018-07-03 DIAGNOSIS — Z7982 Long term (current) use of aspirin: Secondary | ICD-10-CM | POA: Diagnosis not present

## 2018-07-03 DIAGNOSIS — J449 Chronic obstructive pulmonary disease, unspecified: Secondary | ICD-10-CM | POA: Diagnosis not present

## 2018-07-03 DIAGNOSIS — R102 Pelvic and perineal pain: Secondary | ICD-10-CM | POA: Diagnosis not present

## 2018-07-03 DIAGNOSIS — I10 Essential (primary) hypertension: Secondary | ICD-10-CM | POA: Diagnosis not present

## 2018-07-03 DIAGNOSIS — Z79899 Other long term (current) drug therapy: Secondary | ICD-10-CM | POA: Diagnosis not present

## 2018-07-03 DIAGNOSIS — Z87891 Personal history of nicotine dependence: Secondary | ICD-10-CM | POA: Insufficient documentation

## 2018-07-03 DIAGNOSIS — K760 Fatty (change of) liver, not elsewhere classified: Secondary | ICD-10-CM | POA: Diagnosis not present

## 2018-07-03 DIAGNOSIS — K6289 Other specified diseases of anus and rectum: Secondary | ICD-10-CM | POA: Diagnosis not present

## 2018-07-03 LAB — CBC WITH DIFFERENTIAL/PLATELET
Abs Immature Granulocytes: 0.05 10*3/uL (ref 0.00–0.07)
Basophils Absolute: 0.1 10*3/uL (ref 0.0–0.1)
Basophils Relative: 1 %
Eosinophils Absolute: 0.1 10*3/uL (ref 0.0–0.5)
Eosinophils Relative: 1 %
HCT: 39.3 % (ref 36.0–46.0)
Hemoglobin: 13.1 g/dL (ref 12.0–15.0)
Immature Granulocytes: 1 %
Lymphocytes Relative: 18 %
Lymphs Abs: 2 10*3/uL (ref 0.7–4.0)
MCH: 29.6 pg (ref 26.0–34.0)
MCHC: 33.3 g/dL (ref 30.0–36.0)
MCV: 88.9 fL (ref 80.0–100.0)
Monocytes Absolute: 1 10*3/uL (ref 0.1–1.0)
Monocytes Relative: 9 %
Neutro Abs: 7.5 10*3/uL (ref 1.7–7.7)
Neutrophils Relative %: 70 %
Platelets: 315 10*3/uL (ref 150–400)
RBC: 4.42 MIL/uL (ref 3.87–5.11)
RDW: 14.2 % (ref 11.5–15.5)
WBC: 10.7 10*3/uL — ABNORMAL HIGH (ref 4.0–10.5)
nRBC: 0 % (ref 0.0–0.2)

## 2018-07-03 LAB — COMPREHENSIVE METABOLIC PANEL
ALT: 17 U/L (ref 0–44)
AST: 21 U/L (ref 15–41)
Albumin: 3.2 g/dL — ABNORMAL LOW (ref 3.5–5.0)
Alkaline Phosphatase: 72 U/L (ref 38–126)
Anion gap: 10 (ref 5–15)
BUN: 8 mg/dL (ref 8–23)
CO2: 26 mmol/L (ref 22–32)
Calcium: 9 mg/dL (ref 8.9–10.3)
Chloride: 95 mmol/L — ABNORMAL LOW (ref 98–111)
Creatinine, Ser: 0.57 mg/dL (ref 0.44–1.00)
GFR calc Af Amer: >60 mL/min (ref >60)
GFR calc non Af Amer: >60 mL/min (ref >60)
Glucose, Bld: 113 mg/dL — ABNORMAL HIGH (ref 70–99)
Potassium: 3.4 mmol/L — ABNORMAL LOW (ref 3.5–5.1)
Sodium: 131 mmol/L — ABNORMAL LOW (ref 135–145)
Total Bilirubin: 0.5 mg/dL (ref 0.3–1.2)
Total Protein: 7.8 g/dL (ref 6.5–8.1)

## 2018-07-03 LAB — URINALYSIS, ROUTINE W REFLEX MICROSCOPIC
Bacteria, UA: NONE SEEN
Bilirubin Urine: NEGATIVE
Glucose, UA: NEGATIVE mg/dL
Ketones, ur: NEGATIVE mg/dL
Leukocytes,Ua: NEGATIVE
Nitrite: NEGATIVE
Protein, ur: NEGATIVE mg/dL
Specific Gravity, Urine: 1.017 (ref 1.005–1.030)
pH: 8 (ref 5.0–8.0)

## 2018-07-03 LAB — I-STAT CREATININE, ED: Creatinine, Ser: 0.5 mg/dL (ref 0.44–1.00)

## 2018-07-03 MED ORDER — TRAMADOL HCL 50 MG PO TABS
50.0000 mg | ORAL_TABLET | Freq: Once | ORAL | Status: AC
  Administered 2018-07-03: 16:00:00 50 mg via ORAL
  Filled 2018-07-03: qty 1

## 2018-07-03 MED ORDER — IOHEXOL 300 MG/ML  SOLN
100.0000 mL | Freq: Once | INTRAMUSCULAR | Status: AC | PRN
  Administered 2018-07-03: 100 mL via INTRAVENOUS

## 2018-07-03 MED ORDER — TRAMADOL HCL 50 MG PO TABS: 50.0000 mg | ORAL_TABLET | Freq: Four times a day (QID) | ORAL | 0 refills | Status: DC | PRN

## 2018-07-03 NOTE — ED Notes (Signed)
Pt transported to CT ?

## 2018-07-03 NOTE — ED Provider Notes (Signed)
[place] EMERGENCY DEPARTMENT Provider Note   CSN: [id number] Arrival date & time: 07/03/18  1223    History   Chief Complaint Chief Complaint  Patient presents with  . Rectal Pain    HPI Heidi Hobbs is a 78 y.o. female.     Patient is a 78 year old female who has a history of COPD, hypertension and hyperlipidemia who presents with rectal pain and swelling.  She states for the last 6 to 7 weeks she has had pressure and feeling of swelling to her rectum and left vulvar area.  She said she does have some intermittent episodes of constipation for which she is take stool softeners for.  She says the pressure is worse when she has constipation but she has the pain persistently all the time.  She was seen by her PCP at [facility] who referred her to follow-up with gastroenterology although she was not able to make an appointment with them because they are not taking new consults during the [place] outbreak per her report.  She says the pain and pressure is continuing to get worse and she came here for further evaluation.  She does have some chronic issues with UTIs and has a little bit of burning on urination.  She takes chronic Macrobid for preventative measures.  Denies any known fevers.  She has a chronic cough which is unchanged from her baseline.     Past Medical History:  Diagnosis Date  . Adenomatous colon polyp 1995, 2011  . Anxiety   . Carotid stenosis    40-59% right, less than 40% left  . Cataracts, bilateral   . COPD (chronic obstructive pulmonary disease) ([place])   . Essential hypertension 02/13/2016  . Hyperlipidemia   . Hypertension   . MAC (mycobacterium avium-intracellulare complex)   . Macular degeneration, dry 02/13/2016  . Polio 1959  . Stomach ulcer 1960  . 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 Vasculitis ([place]) 02/13/2016   Followed by [facility]     Patient Active Problem List   Diagnosis  Date Noted  . Dysuria 04/08/2018  . Cigarette smoker 07/24/2017  . Venous stasis ulcer ([place]) 05/25/2017  . Atherosclerosis of native arteries of extremity with intermittent claudication ([place]) 05/25/2017  . Occlusion and stenosis of vertebral artery 05/14/2016  . Aortic arch atherosclerosis ([place]) 05/14/2016  . Essential hypertension 02/13/2016  . Vasculitis ([place]) 02/13/2016  . Macular degeneration, dry 02/13/2016  . Acute appendicitis with perforation and peritoneal abscess 01/23/2016  . Carotid artery disease ([place]) 07/31/2015  . Bronchiectasis without acute exacerbation ([place]) 08/18/2012  . Cough 08/18/2012  . COPD GOLD 0 08/17/2012    Past Surgical History:  Procedure Laterality Date  . BREAST ENHANCEMENT SURGERY     removal  . CATARACT EXTRACTION    . COLONOSCOPY    . COLONOSCOPY  10/14/09  . LAPAROSCOPIC APPENDECTOMY N/A 01/23/2016   Procedure: APPENDECTOMY LAPAROSCOPIC;  Surgeon: Erroll Luna, MD;  Location: [place];  Service: General;  Laterality: N/A;  . POLYPECTOMY  2011   x8  . TONSILLECTOMY AND ADENOIDECTOMY    . VAGINAL HYSTERECTOMY  1970   uterine cancer     OB History   No obstetric history on file.      Home Medications    Prior to Admission medications   Medication Sig Start Date End Date Taking? Authorizing Provider  acetaminophen (TYLENOL) 325 MG tablet Take 2 tablets (650 mg total) by mouth every 4 (four)  hours as needed for mild pain, moderate pain, fever or headache. 02/01/16   Earnstine Regal, PA-C  albuterol (PROVENTIL HFA;VENTOLIN HFA) 108 (90 Base) MCG/ACT inhaler Inhale 1 puff into the lungs every 6 (six) hours as needed for wheezing or shortness of breath. 04/07/18   Tanda Rockers, MD  ALPRAZolam Duanne Moron) 0.25 MG tablet Take 0.25 tablets by mouth daily as needed. 07/22/17   [provider]  amitriptyline (ELAVIL) 100 MG tablet Take 50 mg by mouth at bedtime.     [provider]  amLODipine (NORVASC) 5 MG tablet Take 5 mg by mouth  daily.     [provider]  aspirin 81 MG EC tablet Take 81 mg by mouth daily.     [provider]  atenolol (TENORMIN) 50 MG tablet Take 50 mg by mouth daily.    [provider]  atorvastatin (LIPITOR) 40 MG tablet Take 20 mg by mouth daily.     [provider]  cetirizine (ZYRTEC) 10 MG tablet Take 10 mg by mouth daily.    [provider]  ciprofloxacin (CIPRO) 500 MG tablet Take 1 tablet (500 mg total) by mouth 2 (two) times daily. 04/07/18   Tanda Rockers, MD  colchicine 0.6 MG tablet Take 0.6 mg by mouth daily as needed.     [provider]  docusate sodium (COLACE) 100 MG capsule You can buy this at any drug store, over the counter and follow package instructions 02/01/16   Earnstine Regal, PA-C  EPINEPHrine 0.3 mg/0.3 mL IJ SOAJ injection Inject 0.3 mLs into the muscle once as needed. Allergic reaction    [provider]  ezetimibe (ZETIA) 10 MG tablet Take 10 mg by mouth daily.    [provider]  hydrochlorothiazide (HYDRODIURIL) 50 MG tablet Take 50 mg by mouth daily.    [provider]  losartan (COZAAR) 100 MG tablet Take 100 mg by mouth daily.    [provider]  Multiple Vitamins-Minerals (PRESERVISION AREDS 2) CAPS Take 2 tablets by mouth 2 (two) times daily.    [provider]  saccharomyces boulardii (FLORASTOR) 250 MG capsule You can buy this over the counter at any drugstore and use for the next month. 02/01/16   Earnstine Regal, PA-C  traMADol (ULTRAM) 50 MG tablet Take 1 tablet (50 mg total) by mouth every 6 (six) hours as needed. 07/03/18   Malvin Johns, MD    Family History Family History  Problem Relation Age of Onset  . Colon cancer Father   . Esophageal cancer Brother   . Esophageal cancer Brother   . Asthma Daughter        as a child  . Stomach cancer Neg Hx   . Rectal cancer Neg Hx     Social History Social History   Tobacco Use  . Smoking status:  Former Smoker    Packs/day: 0.50    Years: 50.00    Pack years: 25.00    Types: Cigarettes    Last attempt to quit: 08/01/2017    Years since quitting: 0.9  . Smokeless tobacco: Never Used  Substance Use Topics  . Alcohol use: No    Alcohol/week: 0.0 standard drinks  . Drug use: No     Allergies   Eggs or egg-derived products   Review of Systems Review of Systems  Constitutional: Negative for chills, diaphoresis, fatigue and fever.  HENT: Negative for congestion, rhinorrhea and sneezing.   Eyes: Negative.   Respiratory: Negative for  cough, chest tightness and shortness of breath.   Cardiovascular: Negative for chest pain and leg swelling.  Gastrointestinal: Positive for rectal pain. Negative for abdominal pain, blood in stool, diarrhea, nausea and vomiting.  Genitourinary: Negative for difficulty urinating, flank pain, frequency and hematuria.  Musculoskeletal: Negative for arthralgias and back pain.  Skin: Negative for rash.  Neurological: Negative for dizziness, speech difficulty, weakness, numbness and headaches.     Physical Exam Updated Vital Signs BP (!) 163/61   Pulse 80   Temp 98.6 F (37 C) (Oral)   Resp 18   SpO2 100%   Physical Exam Constitutional:      Appearance: She is well-developed.  HENT:     Head: Normocephalic and atraumatic.  Eyes:     Pupils: Pupils are equal, round, and reactive to light.  Neck:     Musculoskeletal: Normal range of motion and neck supple.  Cardiovascular:     Rate and Rhythm: Normal rate and regular rhythm.     Heart sounds: Normal heart sounds.  Pulmonary:     Effort: Pulmonary effort is normal. No respiratory distress.     Breath sounds: Normal breath sounds. No wheezing or rales.  Chest:     Chest wall: No tenderness.  Abdominal:     General: Bowel sounds are normal.     Palpations: Abdomen is soft.     Tenderness: There is no abdominal tenderness. There is no guarding or rebound.  Genitourinary:    Comments:  Patient has some tenderness on palpation of the left perirectal area and the left vulva.  She has some tenderness to goes up over her left buttocks toward her sciatic nerve area but no specific tenderness over sciatic nerve.  She has no bony tenderness.  No visible swelling although she says it feels very swollen down there.  She has some nonthrombosed, nontender hemorrhoids which are noninflamed.  She has no pain during rectal exam.  Brown stool is present. Musculoskeletal: Normal range of motion.  Lymphadenopathy:     Cervical: No cervical adenopathy.  Skin:    General: Skin is warm and dry.     Findings: No rash.  Neurological:     Mental Status: She is alert and oriented to person, place, and time.      ED Treatments / Results  Labs (all labs ordered are listed, but only abnormal results are displayed) Labs Reviewed  COMPREHENSIVE METABOLIC PANEL - Abnormal; Notable for the following components:      Result Value   Sodium 131 (*)    Potassium 3.4 (*)    Chloride 95 (*)    Glucose, Bld 113 (*)    Albumin 3.2 (*)    All other components within normal limits  CBC WITH DIFFERENTIAL/PLATELET - Abnormal; Notable for the following components:   WBC 10.7 (*)    All other components within normal limits  URINALYSIS, ROUTINE W REFLEX MICROSCOPIC - Abnormal; Notable for the following components:   Hgb urine dipstick SMALL (*)    All other components within normal limits  I-STAT CREATININE, ED    EKG None  Radiology Ct Abdomen Pelvis W Contrast  Result Date: 07/03/2018 CLINICAL DATA:  78 year old presenting with a two-month history of pressure and pain involving the rectum and vagina. Patient denies rectal bleeding. Surgical history includes hysterectomy and appendectomy. EXAM: CT ABDOMEN AND PELVIS WITH CONTRAST TECHNIQUE: Multidetector CT imaging of the abdomen and pelvis was performed using the standard protocol following bolus administration of intravenous contrast. CONTRAST:  12m OMNIPAQUE IOHEXOL 300 MG/ML IV. COMPARISON:  04/05/2016 and earlier. FINDINGS: Lower chest: Minimal scarring involving the lower lobes, RIGHT MIDDLE LOBE and lingula, unchanged. Visualized lung bases otherwise clear. Stable normal heart size. Minimal mitral annular calcification. Hepatobiliary: Diffuse hepatic steatosis with focal areas of sparing in the periphery of the liver. No hepatic parenchymal masses. Gallbladder normal in appearance without calcified gallstones. No biliary ductal dilation. Pancreas: Atrophic without evidence of mass or peripancreatic inflammation. Spleen: Normal in size and appearance. Adrenals/Urinary Tract: BILATERAL adrenal nodules, at least 2 if not 3 involving the LEFT adrenal gland and a single small nodule involving the RIGHT adrenal gland, unchanged over multiple prior examinations. The largest nodule arises from the MEDIAL limb of the LEFT adrenal gland and measures approximately 2.7 x 1.5 cm. Stable benign subcentimeter cortical cyst arising from the LOWER pole the LEFT kidney. No significant parenchymal abnormality involving either kidney. No hydronephrosis. No urinary tract calculi. Normal appearing urinary bladder. Stomach/Bowel: Very small hiatal hernia, unchanged. Stomach decompressed and normal in appearance. Diverticula arising from the MEDIAL aspect of the second portion of the duodenum and the ANTERIOR aspect of the third portion of the duodenum. Small bowel otherwise normal in appearance. Sigmoid colon extremely tortuous and elongated. Severe descending and sigmoid colon diverticulosis without evidence of acute diverticulitis. Mobile cecum positioned in the RIGHT UPPER QUADRANT of the abdomen. Appendix surgically absent. Vascular/Lymphatic: Severe aorto-iliofemoral atherosclerosis. Celiac, SMA and IMA patent with atherosclerosis at their origins. Normal-appearing portal venous and systemic venous systems. No pathologic lymphadenopathy. Reproductive: Surgically  absent uterus. No adnexal masses. Other: Laxity of the pelvic floor musculature is suspected, as the bladder and rectum are somewhat low in the pelvis. Musculoskeletal: Osseous demineralization. Facet degenerative changes throughout the lumbar spine. Degenerative changes involving both hips. No acute findings. IMPRESSION: 1. No acute abnormalities involving the abdomen or pelvis. 2. Possible laxity of the pelvic floor musculature. 3. Severe descending and sigmoid colon diverticulosis without evidence of acute diverticulitis. 4. Diffuse hepatic steatosis with focal areas of sparing in the periphery of the liver. 5. Small hiatal hernia. 6. Duodenal diverticula. 7. Stable bilateral adrenal adenomas dating back to 2013. 8.  Aortic Atherosclerosis, severe.  (ICD10-170.0) Electronically Signed   By: TEvangeline DakinM.D.   On: 07/03/2018 14:55    Procedures Procedures (including critical care time)  Medications Ordered in ED Medications  iohexol (OMNIPAQUE) 300 MG/ML solution 100 mL (100 mLs Intravenous Contrast Given 07/03/18 1410)  traMADol (ULTRAM) tablet 50 mg (50 mg Oral Given 07/03/18 1536)     Initial Impression / Assessment and Plan / ED Course  I have reviewed the triage vital signs and the nursing notes.  Pertinent labs & imaging results that were available during my care of the patient were reviewed by me and considered in my medical decision making (see chart for details).        Patient is a 78year old female who presents with pain in her pelvic and rectal area.  She does not have any suggestions of infection.  There is no visible changes on my exam.  CT scan does not show any underlying mass or other acute abnormality.  She could have some disruption in her pelvic floor muscles.  I suspect this may be pushing on a nerve which is causing her pain.  It does not seem to be as consistent with sciatica.  She is neurologically intact.  Her labs are non-concerning.  Her urine does not appear to  be infected.  She was  discharged home in good condition.  She was encouraged to follow-up with her PCP.  She was given a prescription for a short course of tramadol.  Return precautions were given.  Final Clinical Impressions(s) / ED Diagnoses   Final diagnoses:  Pelvic pain    ED Discharge Orders         Ordered    traMADol (ULTRAM) 50 MG tablet  Every 6 hours PRN     07/03/18 1545           Malvin Johns, MD 07/03/18 1547

## 2018-07-03 NOTE — ED Triage Notes (Signed)
Pt reports pain and swelling around her rectum and vagina for several days. Denies nausea or vomiting. Pt denies any blood per rectum.

## 2018-07-03 NOTE — ED Notes (Signed)
Pt verbalized understanding of discharge instructions and denies any further questions at this time.   

## 2018-07-03 NOTE — ED Notes (Signed)
Urine culture sent to lab.

## 2018-07-05 ENCOUNTER — Ambulatory Visit: Payer: Medicare Other | Admitting: Internal Medicine

## 2018-07-06 DIAGNOSIS — N329 Bladder disorder, unspecified: Secondary | ICD-10-CM | POA: Diagnosis not present

## 2018-07-06 DIAGNOSIS — K635 Polyp of colon: Secondary | ICD-10-CM | POA: Diagnosis not present

## 2018-07-06 DIAGNOSIS — I10 Essential (primary) hypertension: Secondary | ICD-10-CM | POA: Diagnosis not present

## 2018-07-06 DIAGNOSIS — R102 Pelvic and perineal pain: Secondary | ICD-10-CM | POA: Diagnosis not present

## 2018-07-10 DIAGNOSIS — H353212 Exudative age-related macular degeneration, right eye, with inactive choroidal neovascularization: Secondary | ICD-10-CM | POA: Diagnosis not present

## 2018-07-10 DIAGNOSIS — H353221 Exudative age-related macular degeneration, left eye, with active choroidal neovascularization: Secondary | ICD-10-CM | POA: Diagnosis not present

## 2018-07-14 DIAGNOSIS — I10 Essential (primary) hypertension: Secondary | ICD-10-CM | POA: Diagnosis not present

## 2018-07-14 DIAGNOSIS — E7849 Other hyperlipidemia: Secondary | ICD-10-CM | POA: Diagnosis not present

## 2018-07-14 DIAGNOSIS — M859 Disorder of bone density and structure, unspecified: Secondary | ICD-10-CM | POA: Diagnosis not present

## 2018-07-14 DIAGNOSIS — R82998 Other abnormal findings in urine: Secondary | ICD-10-CM | POA: Diagnosis not present

## 2018-07-14 DIAGNOSIS — D519 Vitamin B12 deficiency anemia, unspecified: Secondary | ICD-10-CM | POA: Diagnosis not present

## 2018-07-14 DIAGNOSIS — R739 Hyperglycemia, unspecified: Secondary | ICD-10-CM | POA: Diagnosis not present

## 2018-07-20 DIAGNOSIS — N3941 Urge incontinence: Secondary | ICD-10-CM | POA: Diagnosis not present

## 2018-07-20 DIAGNOSIS — R102 Pelvic and perineal pain: Secondary | ICD-10-CM | POA: Diagnosis not present

## 2018-07-21 DIAGNOSIS — R102 Pelvic and perineal pain: Secondary | ICD-10-CM | POA: Diagnosis not present

## 2018-07-21 DIAGNOSIS — K635 Polyp of colon: Secondary | ICD-10-CM | POA: Diagnosis not present

## 2018-07-21 DIAGNOSIS — Z0000 Encounter for general adult medical examination without abnormal findings: Secondary | ICD-10-CM | POA: Diagnosis not present

## 2018-07-21 DIAGNOSIS — N329 Bladder disorder, unspecified: Secondary | ICD-10-CM | POA: Diagnosis not present

## 2018-07-26 ENCOUNTER — Ambulatory Visit (INDEPENDENT_AMBULATORY_CARE_PROVIDER_SITE_OTHER): Payer: Medicare Other | Admitting: Gastroenterology

## 2018-07-26 ENCOUNTER — Encounter: Payer: Self-pay | Admitting: Gastroenterology

## 2018-07-26 ENCOUNTER — Other Ambulatory Visit: Payer: Self-pay

## 2018-07-26 VITALS — Ht 67.0 in | Wt 156.0 lb

## 2018-07-26 DIAGNOSIS — R1030 Lower abdominal pain, unspecified: Secondary | ICD-10-CM

## 2018-07-26 DIAGNOSIS — R102 Pelvic and perineal pain: Secondary | ICD-10-CM | POA: Diagnosis not present

## 2018-07-26 DIAGNOSIS — K5900 Constipation, unspecified: Secondary | ICD-10-CM

## 2018-07-26 MED ORDER — DICYCLOMINE HCL 10 MG PO CAPS: 10.0000 mg | ORAL_CAPSULE | Freq: Three times a day (TID) | ORAL | 11 refills | Status: DC

## 2018-07-26 NOTE — Progress Notes (Signed)
History of Present Illness: This is a 78 year old female referred by Shon Baton, MD for the evaluation of constipation alternating with diarrhea, pelvic pain, lower abdominal pain.  Her daughter accompanies her on this visit and provides some of the history.  She has a history of adenomatous colon polyps and last underwent colonoscopy in February 2013 with finding of one small tubular adenoma, moderate left colon diverticulosis, a tortuous and redundant colon and a polypectomy scar site in her hepatic flexure.  She was recommended to have a 3-year interval colonoscopy and she has not returned for colonoscopy.  She relates problems with mild pelvic pain since her appendectomy in November 2017 over the past 2 to 3 months her symptoms have substantially worsened.  She was evaluated in the ED on April 20 and I reviewed those notes.  AP CT scan as below.  She was seen by [facility] and she has appointment with [facility] in a few weeks.  She takes an over-the-counter laxative intermittently.  She describes her stools as generally pellet-like almost all the time except after 4-5 days of pellet-like stools she will have urgent watery diarrhea for a day or so. Denies weight loss, change in stool caliber, melena, hematochezia, nausea, vomiting, dysphagia, reflux symptoms, chest pain.   CT AP 07/03/2018 IMPRESSION: 1. No acute abnormalities involving the abdomen or pelvis. 2. Possible laxity of the pelvic floor musculature. 3. Severe descending and sigmoid colon diverticulosis without evidence of acute diverticulitis. 4. Diffuse hepatic steatosis with focal areas of sparing in the periphery of the liver. 5. Small hiatal hernia. 6. Duodenal diverticula. 7. Stable bilateral adrenal adenomas dating back to 2013. 8.  Aortic Atherosclerosis, severe.  (ICD10-170.0)    Allergies  Allergen Reactions  . Eggs Or Egg-Derived Products     Skin nodules   Outpatient Medications Prior to Visit  Medication Sig Dispense  Refill  . acetaminophen (TYLENOL) 325 MG tablet Take 2 tablets (650 mg total) by mouth every 4 (four) hours as needed for mild pain, moderate pain, fever or headache.    . albuterol (PROVENTIL HFA;VENTOLIN HFA) 108 (90 Base) MCG/ACT inhaler Inhale 1 puff into the lungs every 6 (six) hours as needed for wheezing or shortness of breath. 1 Inhaler 1  . ALPRAZolam (XANAX) 0.25 MG tablet Take 0.25 tablets by mouth daily as needed.  1  . amitriptyline (ELAVIL) 100 MG tablet Take 100 mg by mouth at bedtime.     Marland Kitchen amLODipine (NORVASC) 5 MG tablet Take 5 mg by mouth daily.     Marland Kitchen aspirin 81 MG EC tablet Take 81 mg by mouth daily.     Marland Kitchen atenolol (TENORMIN) 50 MG tablet Take 50 mg by mouth daily.    Marland Kitchen atorvastatin (LIPITOR) 40 MG tablet Take 20 mg by mouth every other day.     . cetirizine (ZYRTEC) 10 MG tablet Take 10 mg by mouth daily.    . colchicine 0.6 MG tablet Take 0.6 mg by mouth daily as needed.     . docusate sodium (COLACE) 100 MG capsule You can buy this at any drug store, over the counter and follow package instructions 10 capsule 0  . EPINEPHrine 0.3 mg/0.3 mL IJ SOAJ injection Inject 0.3 mLs into the muscle once as needed. Allergic reaction    . ezetimibe (ZETIA) 10 MG tablet Take 10 mg by mouth daily.    . hydrochlorothiazide (HYDRODIURIL) 25 MG tablet Take 25 mg by mouth daily.    Marland Kitchen losartan (COZAAR) 100  MG tablet Take 100 mg by mouth daily.    . Multiple Vitamins-Minerals (PRESERVISION AREDS 2) CAPS Take 2 tablets by mouth 2 (two) times daily.    Marland Kitchen saccharomyces boulardii (FLORASTOR) 250 MG capsule You can buy this over the counter at any drugstore and use for the next month.    . traMADol (ULTRAM) 50 MG tablet Take 1 tablet (50 mg total) by mouth every 6 (six) hours as needed. 15 tablet 0  . ciprofloxacin (CIPRO) 500 MG tablet Take 1 tablet (500 mg total) by mouth 2 (two) times daily. (Patient not taking: Reported on 07/26/2018) 14 tablet 0  . hydrochlorothiazide (HYDRODIURIL) 50 MG tablet  Take 50 mg by mouth daily.     No facility-administered medications prior to visit.    Past Medical History:  Diagnosis Date  . Adenomatous colon polyp 1995, 2011  . Anxiety   . Carotid stenosis    40-59% right, less than 40% left  . Cataracts, bilateral   . COPD (chronic obstructive pulmonary disease) ([place])   . Essential hypertension 02/13/2016  . Hyperlipidemia   . Hypertension   . MAC (mycobacterium avium-intracellulare complex)   . Macular degeneration, dry 02/13/2016  . Polio 1959  . Stomach ulcer 1960  . Vasculitis ([place])    Normocomplementemic urticarial   . Vasculitis ([place]) 02/13/2016   [facility]    Past Surgical History:  Procedure Laterality Date  . BREAST ENHANCEMENT SURGERY     removal  . CATARACT EXTRACTION    . COLONOSCOPY    . COLONOSCOPY  10/14/09  . LAPAROSCOPIC APPENDECTOMY N/A 01/23/2016   Procedure: APPENDECTOMY LAPAROSCOPIC;  Surgeon: Erroll Luna, MD;  Location: [place] [facility];  Service: General;  Laterality: N/A;  . POLYPECTOMY  2011   x8  . TONSILLECTOMY AND ADENOIDECTOMY    . VAGINAL HYSTERECTOMY  1970   uterine cancer   Social History   Socioeconomic History  . Marital status: Widowed    Spouse name: Not on file  . Number of children: 3  . Years of education: Not on file  . Highest education level: Not on file  Occupational History  . Occupation: Works PT as a Engineer, structural  . Financial resource strain: Not on file  . Food insecurity:    Worry: Not on file    Inability: Not on file  . Transportation needs:    Medical: Not on file    Non-medical: Not on file  Tobacco Use  . Smoking status: Former Smoker    Packs/day: 0.50    Years: 50.00    Pack years: 25.00    Types: Cigarettes    Last attempt to quit: 08/01/2017    Years since quitting: 0.9  . Smokeless tobacco: Never Used  Substance and Sexual Activity  . Alcohol use: No    Alcohol/week: 0.0 standard drinks  . Drug use: No  . Sexual activity: Not on  file  Lifestyle  . Physical activity:    Days per week: Not on file    Minutes per session: Not on file  . Stress: Not on file  Relationships  . Social connections:    Talks on phone: Not on file    Gets together: Not on file    Attends religious service: Not on file    Active member of club or organization: Not on file    Attends meetings of clubs or organizations: Not on file    Relationship status: Not on file  Other  Topics Concern  . Not on file  Social History Narrative  . Not on file   Family History  Problem Relation Age of Onset  . Colon cancer Father   . Esophageal cancer Brother   . Esophageal cancer Brother   . Asthma Daughter        as a child  . Stomach cancer Neg Hx   . Rectal cancer Neg Hx       Review of Systems: Pertinent positive and negative review of systems were noted in the above HPI section. All other review of systems were otherwise negative.    Physical Exam: Telemedicine - not peformed   Assessment and Recommendations:  1. Constipation alternating with diarrhea, pelvic pain, lower abdominal pain.  Pain could be related to pelvic muscle laxity, adhesions following appendectomy, constipation or other disorders.  Substantially increase daily water intake to 6 to 8 glasses of water or juice.  Increase dietary fiber intake with high-fiber foods such as prunes or prune juice.  Begin Benefiber once daily.  Begin MiraLAX one half scoop to 2 scoops daily titrated for a complete, formed bowel movement daily or every other day.  Dicyclomine 10 mg 3 times daily as needed abdominal pain, pelvic pain. We discussed colonoscopy for further evaluation however she is reluctant to proceed.  Plan for return office visit in 1 month, reassess symptoms and need for further evaluation.  2.  Personal history of adenomatous colon polyps.  4 years overdue for surveillance colonoscopy.  She is reluctant to proceed with colonoscopy as above.  We discuss again at her follow-up  visit in 1 month.  3.  Hepatic steatosis.   4.  COPD, bronchiectasis.    These services were provided via telemedicine, audio only per patient request.  The patient was at home accompanied by her daughter on the call and the provider was in the office, alone.  We discussed the limitations of evaluation and management by telemedicine and the availability of in person appointments.  Patient consented for this telemedicine visit and is aware of possible charges for this service.  The other person participating in the telemedicine service was Marlon Pel, [place] who reviewed medications, allergies, past history and completed AVS.  Time spent on call: 19 minutes    cc: Shon Baton, MD [address] [place], [place] [address]

## 2018-07-26 NOTE — Patient Instructions (Addendum)
We have sent the following medications to your pharmacy for you to pick up at your convenience: dicyclomine.   Increase your water intake to 6-8 glasses of water/juice daily.   Start over the [facility] daily.   Miralax 1/2 scoop up to 2 scoops daily titrated for a complete bowel movement daily or every other day.  Your follow up appt is with Dr. Fuller Plan on 08/25/18 at 10:00am.

## 2018-08-24 DIAGNOSIS — Z961 Presence of intraocular lens: Secondary | ICD-10-CM | POA: Diagnosis not present

## 2018-08-24 DIAGNOSIS — H353231 Exudative age-related macular degeneration, bilateral, with active choroidal neovascularization: Secondary | ICD-10-CM | POA: Diagnosis not present

## 2018-08-25 ENCOUNTER — Ambulatory Visit: Payer: Medicare Other | Admitting: Gastroenterology

## 2018-08-30 DIAGNOSIS — R102 Pelvic and perineal pain: Secondary | ICD-10-CM | POA: Diagnosis not present

## 2018-08-30 DIAGNOSIS — N390 Urinary tract infection, site not specified: Secondary | ICD-10-CM | POA: Diagnosis not present

## 2018-08-30 DIAGNOSIS — N8110 Cystocele, unspecified: Secondary | ICD-10-CM | POA: Diagnosis not present

## 2018-09-28 DIAGNOSIS — H353231 Exudative age-related macular degeneration, bilateral, with active choroidal neovascularization: Secondary | ICD-10-CM | POA: Diagnosis not present

## 2018-10-12 DIAGNOSIS — N390 Urinary tract infection, site not specified: Secondary | ICD-10-CM | POA: Diagnosis not present

## 2018-10-12 DIAGNOSIS — M1990 Unspecified osteoarthritis, unspecified site: Secondary | ICD-10-CM | POA: Diagnosis not present

## 2018-10-12 DIAGNOSIS — M79621 Pain in right upper arm: Secondary | ICD-10-CM | POA: Diagnosis not present

## 2018-10-12 DIAGNOSIS — M25511 Pain in right shoulder: Secondary | ICD-10-CM | POA: Diagnosis not present

## 2018-10-18 ENCOUNTER — Ambulatory Visit: Payer: Self-pay

## 2018-10-18 ENCOUNTER — Encounter: Payer: Self-pay | Admitting: Orthopedic Surgery

## 2018-10-18 ENCOUNTER — Ambulatory Visit (INDEPENDENT_AMBULATORY_CARE_PROVIDER_SITE_OTHER): Payer: Medicare Other | Admitting: Orthopedic Surgery

## 2018-10-18 DIAGNOSIS — M25511 Pain in right shoulder: Secondary | ICD-10-CM | POA: Diagnosis not present

## 2018-10-18 DIAGNOSIS — M5410 Radiculopathy, site unspecified: Secondary | ICD-10-CM

## 2018-10-18 DIAGNOSIS — M25512 Pain in left shoulder: Secondary | ICD-10-CM

## 2018-10-18 NOTE — Progress Notes (Signed)
Office Visit Note   Patient: Heidi Hobbs           Date of Birth: 1940-05-03           MRN: [id number] Visit Date: 10/18/2018 Requested by: Heidi Hobbs, [place],  [place] [address] PCP: Heidi Baton, MD  Subjective: Chief Complaint  Patient presents with  . Right Shoulder - Pain  . Left Shoulder - Pain    HPI: Heidi Hobbs is a patient with bilateral shoulder pain right worse than left for 1 month.  Denies any history of injury.  Does report radicular nature to the pain radiating into the arm but without numbness and tingling.  Does wake her from sleep at night most nights.  She is right-hand dominant.  She is tried Ultram without relief.  Does report mild neck pain as well.  She has macular degeneration.              ROS: All systems reviewed are negative as they relate to the chief complaint within the history of present illness.  Patient denies  fevers or chills.   Assessment & Plan: Visit Diagnoses:  1. Bilateral shoulder pain, unspecified chronicity   2. Radiculopathy of arm     Plan: Impression is deep bilateral shoulder pain in the mid humeral region and upper humeral region with normal shoulder examination and normal radiographs.  Radiographs of the cervical spine do show mid level cervical degeneration.  I think this is likely coming from her neck.  Is been going on for well over a month and she is having fairly incapacitating pain on a daily basis particularly at night.  Plan C-spine MRI to evaluate for probable C4-5 or C5-6 foraminal stenosis giving the symptoms.  I will see her back after that study  Follow-Up Instructions: Return for after MRI.   Orders:  Orders Placed This Encounter  Procedures  . XR Shoulder Right  . XR Shoulder Left  . XR Cervical Spine 2 or 3 views  . MR Cervical Spine w/o contrast   No orders of the defined types were placed in this encounter.     Procedures: No procedures performed   Clinical Data: No additional  findings.  Objective: Vital Signs: There were no vitals taken for this visit.  Physical Exam:   Constitutional: Patient appears well-developed HEENT:  Head: Normocephalic Eyes:EOM are normal Neck: Normal range of motion Cardiovascular: Normal rate Pulmonary/chest: Effort normal Neurologic: Patient is alert Skin: Skin is warm Psychiatric: Patient has normal mood and affect    Ortho Exam: Ortho exam demonstrates mildly limited flexion extension of the neck with rotation to about 40 degrees bilaterally.  Patient has 5 out of 5 grip EPL FPL interosseous wrist flexion extension bicep triceps and deltoid strength with good rotator cuff strength bilaterally infraspinatus supraspinatus and subscap muscle testing.  Radial pulse intact bilaterally.  No masses lymphadenopathy or skin changes noted in the shoulder girdle region.  No restriction of passive range of motion of both shoulders.  Patient has no definite paresthesias C5-T1.  Specialty Comments:  No specialty comments available.  Imaging: Xr Cervical Spine 2 Or 3 Views  Result Date: 10/18/2018 AP lateral cervical spine reviewed.  Degenerative changes noted at C6-7 C7-8.  No spinal listhesis.  Anterior spurring present.  No acute fracture.  Xr Shoulder Left  Result Date: 10/18/2018 AP outlet axillary left shoulder reviewed.  No significant glenohumeral arthritis or AC joint arthritis.  Acromiohumeral distance normal.  No acute fracture.  Shoulder is located.  Some calcification of the aorta is present.  Xr Shoulder Right  Result Date: 10/18/2018 AP outlet axillary right shoulder reviewed.  Visualized lung fields clear.  No acute fracture or dislocation.  Acromiohumeral distance normal.  No glenohumeral arthritis.  Impression is normal right shoulder    PMFS History: Patient Active Problem List   Diagnosis Date Noted  . Dysuria 04/08/2018  . Cigarette smoker 07/24/2017  . Venous stasis ulcer ([place]) 05/25/2017  . Atherosclerosis  of native arteries of extremity with intermittent claudication ([place]) 05/25/2017  . Occlusion and stenosis of vertebral artery 05/14/2016  . Aortic arch atherosclerosis ([place]) 05/14/2016  . Essential hypertension 02/13/2016  . Vasculitis ([place]) 02/13/2016  . Macular degeneration, dry 02/13/2016  . Acute appendicitis with perforation and peritoneal abscess 01/23/2016  . Carotid artery disease ([place]) 07/31/2015  . Bronchiectasis without acute exacerbation ([place]) 08/18/2012  . Cough 08/18/2012  . COPD GOLD 0 08/17/2012   Past Medical History:  Diagnosis Date  . Adenomatous colon polyp 1995, 2011  . Anxiety   . Carotid stenosis    40-59% right, less than 40% left  . Cataracts, bilateral   . COPD (chronic obstructive pulmonary disease) ([place])   . Essential hypertension 02/13/2016  . Hyperlipidemia   . Hypertension   . MAC (mycobacterium avium-intracellulare complex)   . Macular degeneration, dry 02/13/2016  . Polio 1959  . Stomach ulcer 1960  . Vasculitis ([place])    Normocomplementemic urticarial   . Vasculitis ([place]) 02/13/2016   Follow[facility]     Family History  Problem Relation Age of Onset  . Colon cancer Father   . Esophageal cancer Brother   . Esophageal cancer Brother   . Asthma Daughter        as a child  . Stomach cancer Neg Hx   . Rectal cancer Neg Hx     Past Surgical History:  Procedure Laterality Date  . BREAST ENHANCEMENT SURGERY     removal  . CATARACT EXTRACTION    . COLONOSCOPY    . COLONOSCOPY  10/14/09  . LAPAROSCOPIC APPENDECTOMY N/A 01/23/2016   Procedure: APPENDECTOMY LAPAROSCOPIC;  Surgeon: Erroll Luna, MD;  Location: [facility];  Service: General;  Laterality: N/A;  . POLYPECTOMY  2011   x8  . TONSILLECTOMY AND ADENOIDECTOMY    . VAGINAL HYSTERECTOMY  1970   uterine cancer   Social History   Occupational History  . 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 Smoking status: Former Smoker    Packs/day: 0.50    Years: 50.00     Pack years: 25.00    Types: Cigarettes    Quit date: 08/01/2017    Years since quitting: 1.2  . Smokeless tobacco: Never Used  Substance and Sexual Activity  . Alcohol use: No    Alcohol/week: 0.0 standard drinks  . Drug use: No  . Sexual activity: Not on file

## 2018-10-25 ENCOUNTER — Telehealth: Payer: Self-pay | Admitting: Orthopedic Surgery

## 2018-10-25 DIAGNOSIS — R339 Retention of urine, unspecified: Secondary | ICD-10-CM | POA: Diagnosis not present

## 2018-10-25 DIAGNOSIS — N390 Urinary tract infection, site not specified: Secondary | ICD-10-CM | POA: Diagnosis not present

## 2018-10-25 DIAGNOSIS — M5410 Radiculopathy, site unspecified: Secondary | ICD-10-CM

## 2018-10-25 DIAGNOSIS — R338 Other retention of urine: Secondary | ICD-10-CM | POA: Diagnosis not present

## 2018-10-25 NOTE — Telephone Encounter (Signed)
Patient's daughter Heidi Hobbs called advised patient is having pain and numbness in her wrist and hands. Tammy said patient is having an MRI on her neck tomorrow. The number to contact Tammy is [phone]

## 2018-10-25 NOTE — Telephone Encounter (Signed)
Need 3 weeks of PT then ROV for exam. Called to advise No answer, no VM to LM. Please order PT

## 2018-10-25 NOTE — Telephone Encounter (Signed)
Please advise. Thanks.  

## 2018-10-26 ENCOUNTER — Other Ambulatory Visit: Payer: Medicare Other

## 2018-10-26 ENCOUNTER — Telehealth: Payer: Self-pay | Admitting: Orthopedic Surgery

## 2018-10-26 NOTE — Telephone Encounter (Signed)
IC explained that unfortunately there was not anything we could do other than place referral and see her back in month for repeat exam and try to get approved at that time.

## 2018-10-26 NOTE — Telephone Encounter (Signed)
Put order in for PT Advised patient of denial of scan Joycelyn Schmid can you please get her a follow up appt with Dr Marlou Sa in a month.

## 2018-10-26 NOTE — Telephone Encounter (Signed)
Patient's daughter called stating that her insurance will not cover her MRI until she has had 3 consecutive weeks of PT.  The daughter is wanting to know if the PT could be bypassed and the insurance still pay for her MRI.  CB#[phone].  Thank you.

## 2018-11-07 ENCOUNTER — Ambulatory Visit: Payer: Medicare Other | Attending: Orthopedic Surgery | Admitting: Physical Therapy

## 2018-11-07 ENCOUNTER — Other Ambulatory Visit: Payer: Self-pay

## 2018-11-07 DIAGNOSIS — M6281 Muscle weakness (generalized): Secondary | ICD-10-CM | POA: Diagnosis not present

## 2018-11-07 DIAGNOSIS — M25511 Pain in right shoulder: Secondary | ICD-10-CM | POA: Insufficient documentation

## 2018-11-07 DIAGNOSIS — M25512 Pain in left shoulder: Secondary | ICD-10-CM | POA: Diagnosis not present

## 2018-11-07 DIAGNOSIS — M25612 Stiffness of left shoulder, not elsewhere classified: Secondary | ICD-10-CM | POA: Diagnosis not present

## 2018-11-07 DIAGNOSIS — M25611 Stiffness of right shoulder, not elsewhere classified: Secondary | ICD-10-CM | POA: Diagnosis not present

## 2018-11-07 NOTE — Patient Instructions (Signed)
Decompression Exercise: Basic - have a pillow under head initially and a small towel under Right arm for support    Lie on back on firm surface, knees bent, feet flat, arms turned up, out to sides, backs of hands down. Time 3-5___ minutes. Surface: floor  Shoulder Press   Press both shoulders down. Hold __3-5_ seconds. Repeat _10__ times. Press one shoulder down. Hold _3-5__ seconds Repeat __10_ times. Do other shoulder. If unable to press one or both shoulders, lie in position a few sessions until you can. Surface: floor or bed to start  Head Press With Timpson chin SLIGHTLY toward chest, keep mouth closed. Feel weight on back of head. Increase weight by pressing head down. Hold _3-5__ seconds. Relax. Repeat __10_ times.  Leg Lengthener: Full   Straighten one leg. Pull toes AND forefoot toward knee, extend heel. Lengthen leg by pulling pelvis away from ribs. Hold __3-5_ seconds. Relax. Repeat 10 times. Re-bend knee. Do other leg. Each leg __10_ times.  Leg Press: Single   Straighten one leg down to floor. Bring toes AND forefoot toward knee, extend heel. Press leg down. DO NOT BEND KNEE. Hold _3-5__ seconds. Relax leg. Repeat exercise 10 times. Relax leg. Re-bend knee. Repeat with other leg. Each leg _10__ times.

## 2018-11-07 NOTE — Therapy (Addendum)
[place], [place], [address] Phone: [phone]   Fax:  [phone]  Physical Therapy Evaluation/Discharge  Patient Details  Name: Heidi Hobbs MRN: [id number] Date of Birth: [birth year] Referring Provider (PT): Dr Bonnell Public   Encounter Date: 11/07/2018  PT End of Session - 11/07/18 1413    Visit Number  1    Number of Visits  4    Date for PT Re-Evaluation  12/05/18    Authorization Type  MCR p note at 10th visit    Authorization Time Period  pt requested once a week    PT Start Time  1414   in late   PT Stop Time  1453    PT Time Calculation (min)  39 min    Activity Tolerance  Patient tolerated treatment well    Behavior During Therapy  [facility] for tasks assessed/performed       Past Medical History:  Diagnosis Date  . Adenomatous colon polyp 1995, 2011  . Anxiety   . Carotid stenosis    40-59% right, less than 40% left  . Cataracts, bilateral   . COPD (chronic obstructive pulmonary disease) ([place])   . Essential hypertension 02/13/2016  . Hyperlipidemia   . Hypertension   . MAC (mycobacterium avium-intracellulare complex)   . Macular degeneration, dry 02/13/2016  . Polio 1959  . Stomach ulcer 1960  . Vasculitis ([place])    Normocomplementemic urticarial   . Vasculitis ([place]) 02/13/2016   Followed by [facility]     Past Surgical History:  Procedure Laterality Date  . BREAST ENHANCEMENT SURGERY     removal  . CATARACT EXTRACTION    . COLONOSCOPY    . COLONOSCOPY  10/14/09  . LAPAROSCOPIC APPENDECTOMY N/A 01/23/2016   Procedure: APPENDECTOMY LAPAROSCOPIC;  Surgeon: Erroll Luna, MD;  Location: [facility];  Service: General;  Laterality: N/A;  . POLYPECTOMY  2011   x8  . TONSILLECTOMY AND ADENOIDECTOMY    . VAGINAL HYSTERECTOMY  1970   uterine cancer    There were no vitals filed for this visit.   Subjective Assessment - 11/07/18 1413    Subjective  Pt reports she was referred  to ortho MD for Rt shoulder pain, xrays showed neck issues.  She states since seeing him now both arms hurt and her neck and both arms are going numb.  Nights are worse. MD wants her to have an MRI however insurance requires PT first. Going back to Dr Marlou Sa tomorrow because of her other problems and changes, wondering if she has RA    Pertinent History  carotid stenosis, COPD, HTN, polio, venous insuffiency    Diagnostic tests  xrays - cervical DDD    Patient Stated Goals  participate so she can get an MRI    Currently in Pain?  Yes    Pain Score  3     Pain Location  Shoulder    Pain Orientation  Right;Left    Pain Descriptors / Indicators  Aching;Burning;Numbness    Pain Type  Acute pain    Pain Onset  More than a month ago    Pain Frequency  Constant    Aggravating Factors   using her arms, trying to sleep    Pain Relieving Factors  nothing         [facility] PT Assessment - 11/07/18 0001      Assessment   Medical Diagnosis  bilat UE radiculopathy    Referring Provider (  PT)  Dr Bonnell Public    Onset Date/Surgical Date  10/07/18    Hand Dominance  Right    Next MD Visit  11/08/2018    Prior Therapy  no      Precautions   Precautions  None      Restrictions   Weight Bearing Restrictions  No      Balance Screen   Has the patient fallen in the past 6 months  No      [place] residence    Living Arrangements  Children      Prior Function   Level of Independence  Independent    Vocation  Retired    Leisure  care for great grandchildren      Observation/Other Assessments   Other Surveys   Neck Disability Index    Neck Disability Index   18/50, 36%       Posture/Postural Control   Posture/Postural Control  Postural limitations    Postural Limitations  Rounded Shoulders;Forward head;Increased thoracic kyphosis      ROM / Strength   AROM / PROM / Strength  AROM;Strength      AROM   AROM Assessment Site  Cervical;Shoulder;Elbow     Right/Left Shoulder  --   bilat flex 80, ER 45, abduct 100,    Right/Left Elbow  --   behind back Rt to SIJ, Lt to L3, elbow WNL   Cervical Flexion  WNL    Cervical Extension  WNL with some dizziness   not sure if it is from carotid stenosis or her polio B/c i   Cervical - Right Rotation  65    Cervical - Left Rotation  50      Strength   Strength Assessment Site  Shoulder;Elbow;Hand    Right/Left Shoulder  --   Rt grossly 3-/5 with pain, Lt 4-/5   Right/Left Elbow  --   biceps WNL, triceps Rt 3+/5, Lt 4/5   Right/Left hand  --   grip equal bilat     Palpation   Spinal mobility  NA at this visit.     Palpation comment  hypersentive to palpation around Rt shoulder complex, cervical area, slight tenderness Lt side.                 Objective measurements completed on examination: See above findings.      [facility] Adult PT Treatment/Exercise - 11/07/18 0001      Exercises   Exercises  Other Exercises    Other Exercises   10 reps of all the decompression series exercises per handout on HEP             PT Education - 11/07/18 1438    Education Details  HEP and POC    Person(s) Educated  Patient    Methods  Explanation;Handout    Comprehension  Verbalized understanding;Tactile cues required          PT Long Term Goals - 11/07/18 1442      PT LONG TERM GOAL #1   Title  I with HEP for postural re-ed and strengthening ( 12/05/2018)    Time  4    Period  Weeks    Status  New    Target Date  12/05/18      PT LONG TERM GOAL #2   Title  demo bilat shoulder and cervical rotation WFL with no pain ( 12/05/2018)    Time  4  Period  Weeks    Status  New    Target Date  12/05/18      PT LONG TERM GOAL #3   Title  report =/> 75% reduction in numbness/tingling in UE's to allow return to normal sleep patterns ( 12/05/2018)    Time  4    Period  Weeks    Status  New    Target Date  12/05/18      PT LONG TERM GOAL #4   Title  demo bilat shoulder strength =/>  4+/5 to allow her to easily lift 2 yo great grandchild up to changing table. ( 12/05/2018)    Time  4    Status  New    Target Date  12/05/18      PT LONG TERM GOAL #5   Title  improve NDI =/<11/50 or22% limited ( 12/05/2018)    Time  4    Period  Weeks    Status  New    Target Date  12/05/18             Plan - 11/07/18 1535    Clinical Impression Statement  78 yo female with recent onset of Rt shoulder pain that has now developed into bilat arm pain/numbness/tingling and neck pain.  These are interfering with her ability to sleep and care for her great granchildren.  She wishes to have an MRI however insurance dictates she try PT first.  Pt has decreased cervical and shoulder ROM, weakness in her UE's and significant postural changes.  She would benefit from PT to work on postural correction, ROM/strength to assist with decreasing upper body symptoms.    Personal Factors and Comorbidities  Comorbidity 3+;Age    Examination-Activity Limitations  Carry;Other;Caring for Others    Examination-Participation Restrictions  Cleaning;Other    Stability/Clinical Decision Making  Evolving/Moderate complexity    Clinical Decision Making  Moderate    Rehab Potential  Fair    PT Frequency  1x / week   per patient request   PT Duration  4 weeks    PT Treatment/Interventions  Patient/family education;Moist Heat;Therapeutic exercise;Ultrasound;Passive range of motion;Dry needling;Cryotherapy;Electrical Stimulation;Manual techniques    PT Next Visit Plan  postural re-ed exercise, manaul PRN to neck and shoulders.    Consulted and Agree with Plan of Care  Patient       Patient will benefit from skilled therapeutic intervention in order to improve the following deficits and impairments:  Decreased range of motion, Impaired UE functional use, Increased muscle spasms, Pain, Decreased strength, Postural dysfunction  Visit Diagnosis: Acute pain of right shoulder - Plan: PT plan of care  cert/re-cert  Acute pain of left shoulder - Plan: PT plan of care cert/re-cert  Muscle weakness (generalized) - Plan: PT plan of care cert/re-cert  Stiffness of left shoulder, not elsewhere classified - Plan: PT plan of care cert/re-cert  Stiffness of right shoulder, not elsewhere classified - Plan: PT plan of care cert/re-cert     Problem List Patient Active Problem List   Diagnosis Date Noted  . Dysuria 04/08/2018  . Cigarette smoker 07/24/2017  . Venous stasis ulcer ([place]) 05/25/2017  . Atherosclerosis of native arteries of extremity with intermittent claudication ([place]) 05/25/2017  . Occlusion and stenosis of vertebral artery 05/14/2016  . Aortic arch atherosclerosis ([place]) 05/14/2016  . Essential hypertension 02/13/2016  . Vasculitis ([place]) 02/13/2016  . Macular degeneration, dry 02/13/2016  . Acute appendicitis with perforation and peritoneal abscess 01/23/2016  . Carotid artery disease ([place]) 07/31/2015  .  Bronchiectasis without acute exacerbation ([place]) 08/18/2012  . Cough 08/18/2012  . COPD GOLD 0 08/17/2012   Jeral Pinch PT 11/07/2018, 3:43 PM  [facility] [address] [place], [place], [address] Phone: [phone]   Fax:  [phone]  Name: Heidi Hobbs MRN: [id number] Date of Birth: 1940/09/21   PHYSICAL THERAPY DISCHARGE SUMMARY  Visits from Start of Care: 1  Current functional level related to goals / functional outcomes: Pt returned to MD reporting increased pain, she has been referred to MRI and surgical consult.    Remaining deficits: As above   Education / Equipment: HEP Plan: Patient agrees to discharge.  Patient goals were not met. Patient is being discharged due to                                                    pt having further medical assessment ?????    Jeral Pinch, PT 11/21/18 10:43 AM

## 2018-11-08 ENCOUNTER — Ambulatory Visit: Payer: Medicare Other | Admitting: Orthopedic Surgery

## 2018-11-08 ENCOUNTER — Encounter: Payer: Self-pay | Admitting: Orthopedic Surgery

## 2018-11-08 MED ORDER — GABAPENTIN 100 MG PO CAPS: 100.0000 mg | ORAL_CAPSULE | Freq: Three times a day (TID) | ORAL | 0 refills | Status: DC

## 2018-11-08 MED ORDER — ACETAMINOPHEN-CODEINE #3 300-30 MG PO TABS: 1.0000 | ORAL_TABLET | Freq: Every day | ORAL | 0 refills | Status: DC

## 2018-11-08 NOTE — Progress Notes (Unsigned)
Office Visit Note   Patient: Heidi Hobbs           Date of Birth: Apr 12, 1940           MRN: [id number] Visit Date: 11/08/2018 Requested by: Shon Baton, [place],  [place] [address] PCP: Shon Baton, MD  Subjective: Chief Complaint  Patient presents with  . Right Shoulder - Pain  . Left Shoulder - Pain    HPI: Heidi Hobbs is a 78 y.o. female who presents to the office complaining of bilateral shoulder pain and radicular symptoms.  Patient was seen about 3 weeks ago and found to have a normal shoulder exam with shoulder symptoms that seem to be coming from her cervical spine.  She was scheduled for MRI pending physical therapy.  Since that was set up she has attended therapy and this is made her symptoms acutely worse.  She notes increased pain with new radicular symptoms that radiates down both of her arms, right greater than left.  She also notes new onset numbness and tingling that occurs in the entire right hand.  She has some weakness that is developed as well of the right arm.  She cannot sleep on either side due to pain.  She has been taking tramadol but this has not provided much relief.              ROS:  All systems reviewed are negative as they relate to the chief complaint within the history of present illness.  Patient denies fevers or chills.  Assessment & Plan: Visit Diagnoses: No diagnosis found.  Plan: Patient is an 77 year old female who presents with bilateral shoulder pain and radicular symptoms.  Her symptoms are worse since beginning therapy as she has now developed numbness and tingling of the right hand as well as some right deltoid weakness on exam.  I am concerned that the pathology in her C-spine is steadily worsening and she needs advanced imaging of her neck sooner rather than later in order to employ targeted therapy in the form of ESI's.  Patient understands and we will proceed with rescheduling the MRI of the C-spine to opt for a sooner  date.  In the meantime prescribed Tylenol 3 for bedtime to help patient sleep as well as gabapentin to help with pain that is likely coming from her neck.  Follow-Up Instructions: No follow-ups on file.   Orders:  No orders of the defined types were placed in this encounter.  Meds ordered this encounter  Medications  . gabapentin (NEURONTIN) 100 MG capsule    Sig: Take 1 capsule (100 mg total) by mouth 3 (three) times daily.    Dispense:  30 capsule    Refill:  0  . acetaminophen-codeine (TYLENOL #3) 300-30 MG tablet    Sig: Take 1 tablet by mouth at bedtime.    Dispense:  20 tablet    Refill:  0      Procedures: No procedures performed   Clinical Data: No additional findings.  Objective: Vital Signs: There were no vitals taken for this visit.  Physical Exam:  Constitutional: Patient appears well-developed HEENT:  Head: Normocephalic Eyes:EOM are normal Neck: Normal range of motion Cardiovascular: Normal rate Pulmonary/chest: Effort normal Neurologic: Patient is alert Skin: Skin is warm Psychiatric: Patient has normal mood and affect  Ortho Exam:  Right shoulder Exam Able to fully forward flex and abduct shoulder overhead No loss of ER relative to the other shoulder.  Good endpoint  with ER No TTP over the [facility] joint or bicipital groove Good subscapularis, supraspinatus, and infraspinatus strength Negative Hawkins impingement 5/5 grip strength, forearm pronation/supination, and bicep strength 5/5 triceps strength 4/5 deltoid strength when compared to the contralateral side (5/5 deltoid strength on the left)  Specialty Comments:  No specialty comments available.  Imaging: No results found.   PMFS History: Patient Active Problem List   Diagnosis Date Noted  . Dysuria 04/08/2018  . Cigarette smoker 07/24/2017  . Venous stasis ulcer ([place]) 05/25/2017  . Atherosclerosis of native arteries of extremity with intermittent claudication ([place]) 05/25/2017  .  Occlusion and stenosis of vertebral artery 05/14/2016  . Aortic arch atherosclerosis ([place]) 05/14/2016  . Essential hypertension 02/13/2016  . Vasculitis ([place]) 02/13/2016  . Macular degeneration, dry 02/13/2016  . Acute appendicitis with perforation and peritoneal abscess 01/23/2016  . Carotid artery disease ([place]) 07/31/2015  . Bronchiectasis without acute exacerbation ([place]) 08/18/2012  . Cough 08/18/2012  . COPD GOLD 0 08/17/2012   Past Medical History:  Diagnosis Date  . Adenomatous colon polyp 1995, 2011  . Anxiety   . Carotid stenosis    40-59% right, less than 40% left  . Cataracts, bilateral   . COPD (chronic obstructive pulmonary disease) ([place])   . Essential hypertension 02/13/2016  . Hyperlipidemia   . Hypertension   . MAC (mycobacterium avium-intracellulare complex)   . Macular degeneration, dry 02/13/2016  . Polio 1959  . Stomach ulcer 1960  . Vasculitis ([place])    Normocomplementemic urticarial   . Vasculitis ([place]) 02/13/2016   [facility]     Family History  Problem Relation Age of Onset  . Colon cancer Father   . Esophageal cancer Brother   . Esophageal cancer Brother   . Asthma Daughter        as a child  . Stomach cancer Neg Hx   . Rectal cancer Neg Hx     Past Surgical History:  Procedure Laterality Date  . BREAST ENHANCEMENT SURGERY     removal  . CATARACT EXTRACTION    . COLONOSCOPY    . COLONOSCOPY  10/14/09  . LAPAROSCOPIC APPENDECTOMY N/A 01/23/2016   Procedure: APPENDECTOMY LAPAROSCOPIC;  Surgeon: Erroll Luna, MD;  Location: [facility];  Service: General;  Laterality: N/A;  . POLYPECTOMY  2011   x8  . TONSILLECTOMY AND ADENOIDECTOMY    . VAGINAL HYSTERECTOMY  1970   uterine cancer   Social History   Occupational History  . 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 Smoking status: Former Smoker    Packs/day: 0.50    Years: 50.00    Pack years: 25.00    Types: Cigarettes    Quit date: 08/01/2017    Years  since quitting: 1.2  . Smokeless tobacco: Never Used  Substance and Sexual Activity  . Alcohol use: No    Alcohol/week: 0.0 standard drinks  . Drug use: No  . Sexual activity: Not on file

## 2018-11-09 ENCOUNTER — Telehealth: Payer: Self-pay

## 2018-11-09 NOTE — Telephone Encounter (Signed)
Please advise. Thanks.  

## 2018-11-09 NOTE — Telephone Encounter (Signed)
Larene Beach with [facility] called wanting to know is it okay to fill the Rx for Tylenol #3, due to patient receiving Tramadol on 10/12/2018, 30 day supply from another provider.  Cb# is [phone].  Please advise.  Thank you.

## 2018-11-09 NOTE — Telephone Encounter (Signed)
She was already given the tramadol. They are wanting to know if you want them to fill T#3

## 2018-11-09 NOTE — Telephone Encounter (Signed)
IC advised per Dr Dean 

## 2018-11-09 NOTE — Telephone Encounter (Signed)
No tramadol thx

## 2018-11-09 NOTE — Telephone Encounter (Signed)
I remember this now.  Tramadol during the day Tylenol 3 1 at night only

## 2018-11-10 ENCOUNTER — Other Ambulatory Visit: Payer: Medicare Other

## 2018-11-13 ENCOUNTER — Telehealth: Payer: Self-pay | Admitting: Orthopedic Surgery

## 2018-11-13 NOTE — Telephone Encounter (Signed)
Patient's daughter called wanting the status of the MRI.  Please call the patient back at [phone].  Thank you.

## 2018-11-14 NOTE — Telephone Encounter (Signed)
This was sent to me by mistake, I think.

## 2018-11-14 NOTE — Telephone Encounter (Signed)
I sw pt this am. Pt is going to contact [facility] to schedule appt

## 2018-11-15 ENCOUNTER — Other Ambulatory Visit: Payer: Self-pay

## 2018-11-15 ENCOUNTER — Ambulatory Visit
Admission: RE | Admit: 2018-11-15 | Discharge: 2018-11-15 | Disposition: A | Discharge status: Home / Self Care | Payer: Medicare Other | Source: Ambulatory Visit | Attending: Orthopedic Surgery | Admitting: Orthopedic Surgery

## 2018-11-15 DIAGNOSIS — M5410 Radiculopathy, site unspecified: Secondary | ICD-10-CM

## 2018-11-15 DIAGNOSIS — M4802 Spinal stenosis, cervical region: Secondary | ICD-10-CM | POA: Diagnosis not present

## 2018-11-15 DIAGNOSIS — M4722 Other spondylosis with radiculopathy, cervical region: Secondary | ICD-10-CM | POA: Diagnosis not present

## 2018-11-15 DIAGNOSIS — Z961 Presence of intraocular lens: Secondary | ICD-10-CM | POA: Insufficient documentation

## 2018-11-15 DIAGNOSIS — M50122 Cervical disc disorder at C5-C6 level with radiculopathy: Secondary | ICD-10-CM | POA: Diagnosis not present

## 2018-11-16 ENCOUNTER — Telehealth: Payer: Self-pay | Admitting: Orthopedic Surgery

## 2018-11-16 ENCOUNTER — Ambulatory Visit: Payer: Medicare Other | Admitting: Physical Therapy

## 2018-11-16 DIAGNOSIS — H353221 Exudative age-related macular degeneration, left eye, with active choroidal neovascularization: Secondary | ICD-10-CM | POA: Diagnosis not present

## 2018-11-16 NOTE — Telephone Encounter (Signed)
Patient called advised she had the MRI yesterday and she is in a lot of pain. Patient said she has numbness in her arms,hands and wrist. Patient asked if Dr Marlou Sa would call her. The number to contact patient is [phone]

## 2018-11-16 NOTE — Telephone Encounter (Signed)
pls advise

## 2018-11-17 ENCOUNTER — Other Ambulatory Visit: Payer: Self-pay

## 2018-11-17 ENCOUNTER — Other Ambulatory Visit: Payer: Medicare Other

## 2018-11-17 DIAGNOSIS — M542 Cervicalgia: Secondary | ICD-10-CM

## 2018-11-17 MED ORDER — ACETAMINOPHEN-CODEINE #3 300-30 MG PO TABS: 1.0000 | ORAL_TABLET | Freq: Two times a day (BID) | ORAL | 0 refills | Status: DC | PRN

## 2018-11-17 NOTE — Telephone Encounter (Signed)
Patients daughter aware Rx called in, also that the order has been placed for an injection and appt made with yates Wednesday morning

## 2018-11-17 NOTE — Telephone Encounter (Signed)
Please call in Tylenol 3 1 p.o. every 12 hours as needed pain #30 and refer her to Dr. Ernestina Patches for C-spine [facility] and refer her to Dr. Lorin Mercy for surgical evaluation thank you

## 2018-11-22 ENCOUNTER — Other Ambulatory Visit: Payer: Self-pay

## 2018-11-22 ENCOUNTER — Encounter: Payer: Self-pay | Admitting: Orthopaedic Surgery

## 2018-11-22 ENCOUNTER — Ambulatory Visit (INDEPENDENT_AMBULATORY_CARE_PROVIDER_SITE_OTHER): Payer: Medicare Other | Admitting: Orthopaedic Surgery

## 2018-11-22 DIAGNOSIS — M4802 Spinal stenosis, cervical region: Secondary | ICD-10-CM

## 2018-11-22 MED ORDER — PREDNISONE 10 MG (21) PO TBPK: ORAL_TABLET | ORAL | 0 refills | Status: DC

## 2018-11-22 NOTE — Progress Notes (Signed)
Office Visit Note   Patient: Heidi Hobbs           Date of Birth: [birth year]           MRN: [id number] Visit Date: 11/22/2018              Requested by: Shon Baton, [address] [place],  [place] [address] PCP: Shon Baton, MD   Assessment & Plan: Visit Diagnoses:  1. Spinal stenosis of cervical region   2. Foraminal stenosis of cervical region     Plan: Patient has single level moderate central stenosis C5-6 with severe by foraminal stenosis C5-6.  Mild foraminal narrowing at other levels but no significant compression other than at C5-6.  Prednisone Dosepak prescribed.  Patient states she like to proceed with operative intervention due to her severe uncontrolled pain.  Surgical treatment was discussed with single level fusion allograft and plate with overnight stay in the hospital at the C5-6 level.  Risks of dysphasia dysphonia use of the postoperative collar discussed questions were elicited and answered.  She understands and requests we proceed.  Follow-Up Instructions: Patient will follow-up 1 week after her C5-6 anterior cervical discectomy and fusion, allograft and plate.  Orders:  No orders of the defined types were placed in this encounter.  Meds ordered this encounter  Medications  . predniSONE (STERAPRED UNI-PAK 21 TAB) 10 MG (21) TBPK tablet    Sig: Take with food daily 6 tablets first day , then 5,4,3,2,1 decreasing one tablet daily with food.    Dispense:  21 tablet    Refill:  0      Procedures: No procedures performed   Clinical Data: No additional findings.   Subjective: Chief Complaint  Patient presents with  . Neck - Pain    HPI 78 year old female seen with ongoing severe neck pain right arm pain right arm weakness.  She has been crying she is used Tylenol 3, Ultram,  anti-inflammatories, Tylenol, topical creams without relief.  Dr. Marlou Sa referred her to me for anterior cervical fusion at C5-6 due to central disc protrusion and severe by  neural foraminal compression with moderate central compression.  She states she cannot get comfortable is unable to tolerate the pain.  Her daughter is with her relating identical history for the patient.  Patient states she cannot take it any longer and something needs to be done.  Pulmonary function test FVC 95% predicted FEV1 90% predicted June 2019.   Review of Systems 14 point review of systems positive for history of bronchiectasis, COPD Gold 0.  Positive for hypertension.  Previous perforated appendix with peritoneal abscess.  Macular degeneration.  Negative for chest pain, negative for angina, 2D echo 2000 60% ejection fraction.  Past 1/2 pack/day smoker x50 years quit May 2019.  Objective: Vital Signs: BP (!) 154/73   Pulse 89   Ht _0  (1.727 m)   Wt 150 lb (68 kg)   BMI 22.81 kg/m   Physical Exam Constitutional:      Appearance: She is well-developed.  HENT:     Head: Normocephalic.     Right Ear: External ear normal.     Left Ear: External ear normal.  Eyes:     Pupils: Pupils are equal, round, and reactive to light.  Neck:     Thyroid: No thyromegaly.     Trachea: No tracheal deviation.  Cardiovascular:     Rate and Rhythm: Normal rate.  Pulmonary:     Effort: Pulmonary effort  right hand. Carpal tunnel exam is negative. Ulnar nerve at the elbow is normal. Negative impingement of the shoulder. Patient has mild biceps weakness on the right normal on the left slight wrist extension weakness on the right negative on the left. Interossei are normal. Normal finger flexion.  Specialty  Comments:  No specialty comments available.  Imaging:  CLINICAL DATA: Radiculopathy, persistent symptoms  EXAM:  MRI CERVICAL SPINE WITHOUT CONTRAST  TECHNIQUE:  Multiplanar, multisequence MR imaging of the cervical spine was  performed. No intravenous contrast was administered.  COMPARISON: None.  FINDINGS:  Alignment: Physiologic  Vertebrae: The vertebral body heights are well maintained. No  fracture, marrow edema,or pathologic marrow infiltration.  Cord: Normal signal and morphology.  Posterior Fossa, vertebral arteries, paraspinal tissues:  The visualized portion of the posterior fossa is unremarkable.  Normal flow voids seen within the vertebral arteries. The paraspinal  soft tissues are unremarkable.  Disc levels:  C1-C2: Atlanto-axial junction is normal, without canal narrowing  C2-C3: No significant spinal canal or neural foraminal narrowing  C3-C4: There is a minimal disc osteophyte complex and uncovertebral  osteophytes which causes mild left neural foraminal narrowing.  C4-C5: There is a disc osteophyte complex and uncovertebral  osteophytes which causes severe left and mild right neural foraminal  narrowing. There is mild effacement anterior thecal sac.  C5-C6: There is a disc osteophyte complex and uncovertebral  osteophytes with a central disc protrusion. There is severe  bilateral neural foraminal narrowing, left greater than right. The  central thecal sac measures 6 mm in AP diameter.  C6-C7: There is a disc osteophyte complex and uncovertebral  osteophytes which causes severe left and mild right neural foraminal  narrowing. There is mild effacement of the anterior thecal sac which  measures 8 mm in AP diameter.  C7-T1: No significant spinal canal or neural foraminal narrowing  IMPRESSION:  Cervical spine spondylosis most notable at C5-C6 with a central disc  protrusion, severe bilateral neural foraminal narrowing and mild to  moderate central canal stenosis.   Electronically Signed  By: Bindu Avutu M.D.  On: 11/15/2018 12:39  Previous cervical spine x-rays 10/18/2018 showed slight narrowing with anterior posterior spurring at C5-6 C6-7 without listhesis. Uncovertebral changes and facet arthropathy was noted.  PMFS History:      Patient Active Problem List   Diagnosis Date Noted  . Spinal stenosis of cervical region 11/22/2018  . Foraminal stenosis of cervical region 11/22/2018  . Dysuria 04/08/2018  . Cigarette smoker 07/24/2017  . Venous stasis ulcer (HCC) 05/25/2017  . Atherosclerosis of native arteries of extremity with intermittent claudication (HCC) 05/25/2017  . Occlusion and stenosis of vertebral artery 05/14/2016  . Aortic arch atherosclerosis (HCC) 05/14/2016  . Essential hypertension 02/13/2016  . Vasculitis (HCC) 02/13/2016  . Macular degeneration, dry 02/13/2016  . Acute appendicitis with perforation and peritoneal abscess 01/23/2016  . Carotid artery disease (HCC) 07/31/2015  . Bronchiectasis without acute exacerbation (HCC) 08/18/2012  . Cough 08/18/2012  . COPD GOLD 0 08/17/2012       Past Medical History:  Diagnosis Date  . Adenomatous colon polyp 1995, 2011  . Anxiety   . Carotid stenosis    40-59% right, less than 40% left  . Cataracts, bilateral   . COPD (chronic obstructive pulmonary disease) (HCC)   . Essential hypertension 02/13/2016  . Hyperlipidemia   . Hypertension   . MAC (mycobacterium avium-intracellulare complex)   . Macular degeneration, dry 02/13/2016  . Polio 1959  . Stomach ulcer 1960  .   0 08/17/2012   Past Medical History:  Diagnosis Date  . Adenomatous colon polyp 1995, 2011  . Anxiety   . Carotid stenosis    40-59% right, less than 40% left  . Cataracts, bilateral   . COPD (chronic obstructive pulmonary disease) ([facility])   . Essential hypertension 02/13/2016  . Hyperlipidemia   . Hypertension   . MAC (mycobacterium avium-intracellulare complex)   . Macular degeneration, dry 02/13/2016  . Polio 1959  . Stomach ulcer 1960  . Vasculitis ([facility])    Normocomplementemic urticarial   . Vasculitis ([place]) 02/13/2016   Followed by [facility]     Family History  Problem Relation Age of Onset  . Colon cancer Father   . Esophageal cancer Brother   . Esophageal cancer Brother   . Asthma  Daughter        as a child  . Stomach cancer Neg Hx   . Rectal cancer Neg Hx     Past Surgical History:  Procedure Laterality Date  . BREAST ENHANCEMENT SURGERY     removal  . CATARACT EXTRACTION    . COLONOSCOPY    . COLONOSCOPY  10/14/09  . LAPAROSCOPIC APPENDECTOMY N/A 01/23/2016   Procedure: APPENDECTOMY LAPAROSCOPIC;  Surgeon: Erroll Luna, MD;  Location: [facility];  Service: General;  Laterality: N/A;  . POLYPECTOMY  2011   x8  . TONSILLECTOMY AND ADENOIDECTOMY    . VAGINAL HYSTERECTOMY  1970   uterine cancer   Social History   Occupational History  . 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 Smoking status: Former Smoker    Packs/day: 0.50    Years: 50.00    Pack years: 25.00    Types: Cigarettes    Quit date: 08/01/2017    Years since quitting: 1.3  . Smokeless tobacco: Never Used  Substance and Sexual Activity  . Alcohol use: No    Alcohol/week: 0.0 standard drinks  . Drug use: No  . Sexual activity: Not on file

## 2018-11-23 ENCOUNTER — Encounter: Payer: Self-pay | Admitting: Orthopaedic Surgery

## 2018-11-23 ENCOUNTER — Other Ambulatory Visit (HOSPITAL_COMMUNITY)
Admission: RE | Admit: 2018-11-23 | Discharge: 2018-11-23 | Disposition: A | Discharge status: Home / Self Care | Payer: Medicare Other | Source: Ambulatory Visit | Attending: Orthopaedic Surgery | Admitting: Orthopaedic Surgery

## 2018-11-23 ENCOUNTER — Ambulatory Visit: Payer: Medicare Other | Admitting: Physical Therapy

## 2018-11-23 DIAGNOSIS — Z01812 Encounter for preprocedural laboratory examination: Secondary | ICD-10-CM | POA: Diagnosis not present

## 2018-11-23 DIAGNOSIS — Z20828 Contact with and (suspected) exposure to other viral communicable diseases: Secondary | ICD-10-CM | POA: Insufficient documentation

## 2018-11-24 ENCOUNTER — Ambulatory Visit: Payer: Medicare Other | Admitting: Orthopedic Surgery

## 2018-11-24 ENCOUNTER — Encounter (HOSPITAL_COMMUNITY): Payer: Self-pay

## 2018-11-24 LAB — NOVEL CORONAVIRUS, NAA (HOSP ORDER, SEND-OUT TO REF LAB; TAT 18-24 HRS): SARS-CoV-2, NAA: NOT DETECTED

## 2018-11-27 ENCOUNTER — Other Ambulatory Visit: Payer: Self-pay

## 2018-11-27 ENCOUNTER — Ambulatory Visit (HOSPITAL_COMMUNITY): Payer: Medicare Other

## 2018-11-27 ENCOUNTER — Encounter (HOSPITAL_COMMUNITY): Payer: Self-pay | Admitting: *Deleted

## 2018-11-27 ENCOUNTER — Ambulatory Visit (HOSPITAL_COMMUNITY): Payer: Medicare Other | Admitting: Anesthesiology

## 2018-11-27 ENCOUNTER — Observation Stay (HOSPITAL_COMMUNITY)
Admission: RE | Admit: 2018-11-27 | Discharge: 2018-11-28 | Disposition: A | Discharge status: Home / Self Care | Payer: Medicare Other | Attending: Orthopaedic Surgery | Admitting: Orthopaedic Surgery

## 2018-11-27 ENCOUNTER — Encounter (HOSPITAL_COMMUNITY)
Admission: RE | Disposition: A | Discharge status: Home / Self Care | Payer: Self-pay | Source: Home / Self Care | Attending: Orthopaedic Surgery

## 2018-11-27 DIAGNOSIS — M50122 Cervical disc disorder at C5-C6 level with radiculopathy: Secondary | ICD-10-CM | POA: Diagnosis not present

## 2018-11-27 DIAGNOSIS — Z87891 Personal history of nicotine dependence: Secondary | ICD-10-CM | POA: Diagnosis not present

## 2018-11-27 DIAGNOSIS — Z91012 Allergy to eggs: Secondary | ICD-10-CM | POA: Diagnosis not present

## 2018-11-27 DIAGNOSIS — Z79899 Other long term (current) drug therapy: Secondary | ICD-10-CM | POA: Insufficient documentation

## 2018-11-27 DIAGNOSIS — H3530 Unspecified macular degeneration: Secondary | ICD-10-CM | POA: Diagnosis not present

## 2018-11-27 DIAGNOSIS — I700 Atherosclerosis of aorta: Secondary | ICD-10-CM | POA: Diagnosis not present

## 2018-11-27 DIAGNOSIS — I70219 Atherosclerosis of native arteries of extremities with intermittent claudication, unspecified extremity: Secondary | ICD-10-CM | POA: Diagnosis not present

## 2018-11-27 DIAGNOSIS — F419 Anxiety disorder, unspecified: Secondary | ICD-10-CM | POA: Diagnosis not present

## 2018-11-27 DIAGNOSIS — E785 Hyperlipidemia, unspecified: Secondary | ICD-10-CM | POA: Insufficient documentation

## 2018-11-27 DIAGNOSIS — Z01818 Encounter for other preprocedural examination: Secondary | ICD-10-CM

## 2018-11-27 DIAGNOSIS — M4802 Spinal stenosis, cervical region: Secondary | ICD-10-CM | POA: Diagnosis not present

## 2018-11-27 DIAGNOSIS — J439 Emphysema, unspecified: Secondary | ICD-10-CM | POA: Insufficient documentation

## 2018-11-27 DIAGNOSIS — I10 Essential (primary) hypertension: Secondary | ICD-10-CM | POA: Insufficient documentation

## 2018-11-27 DIAGNOSIS — I6509 Occlusion and stenosis of unspecified vertebral artery: Secondary | ICD-10-CM | POA: Diagnosis not present

## 2018-11-27 DIAGNOSIS — M4322 Fusion of spine, cervical region: Secondary | ICD-10-CM | POA: Diagnosis not present

## 2018-11-27 DIAGNOSIS — M4722 Other spondylosis with radiculopathy, cervical region: Principal | ICD-10-CM | POA: Insufficient documentation

## 2018-11-27 DIAGNOSIS — J479 Bronchiectasis, uncomplicated: Secondary | ICD-10-CM | POA: Diagnosis not present

## 2018-11-27 DIAGNOSIS — Z419 Encounter for procedure for purposes other than remedying health state, unspecified: Secondary | ICD-10-CM

## 2018-11-27 HISTORY — DX: Anemia, unspecified: D64.9

## 2018-11-27 HISTORY — PX: ANTERIOR CERVICAL DECOMP/DISCECTOMY FUSION: SHX1161

## 2018-11-27 HISTORY — DX: Malignant (primary) neoplasm, unspecified: C80.1

## 2018-11-27 LAB — SURGICAL PCR SCREEN
MRSA, PCR: NEGATIVE
Staphylococcus aureus: NEGATIVE

## 2018-11-27 LAB — URINALYSIS, ROUTINE W REFLEX MICROSCOPIC
Bilirubin Urine: NEGATIVE
Glucose, UA: NEGATIVE mg/dL
Ketones, ur: NEGATIVE mg/dL
Nitrite: NEGATIVE
Protein, ur: NEGATIVE mg/dL
Specific Gravity, Urine: 1.005 (ref 1.005–1.030)
pH: 7 (ref 5.0–8.0)

## 2018-11-27 LAB — COMPREHENSIVE METABOLIC PANEL
ALT: 21 U/L (ref 0–44)
AST: 18 U/L (ref 15–41)
Albumin: 3.2 g/dL — ABNORMAL LOW (ref 3.5–5.0)
Alkaline Phosphatase: 63 U/L (ref 38–126)
Anion gap: 14 (ref 5–15)
BUN: 10 mg/dL (ref 8–23)
CO2: 23 mmol/L (ref 22–32)
Calcium: 8.9 mg/dL (ref 8.9–10.3)
Chloride: 101 mmol/L (ref 98–111)
Creatinine, Ser: 0.72 mg/dL (ref 0.44–1.00)
GFR calc Af Amer: >60 mL/min (ref >60)
GFR calc non Af Amer: >60 mL/min (ref >60)
Glucose, Bld: 102 mg/dL — ABNORMAL HIGH (ref 70–99)
Potassium: 3.1 mmol/L — ABNORMAL LOW (ref 3.5–5.1)
Sodium: 138 mmol/L (ref 135–145)
Total Bilirubin: 0.5 mg/dL (ref 0.3–1.2)
Total Protein: 6.9 g/dL (ref 6.5–8.1)

## 2018-11-27 LAB — CBC
HCT: 40.7 % (ref 36.0–46.0)
Hemoglobin: 13.7 g/dL (ref 12.0–15.0)
MCH: 29.8 pg (ref 26.0–34.0)
MCHC: 33.7 g/dL (ref 30.0–36.0)
MCV: 88.5 fL (ref 80.0–100.0)
Platelets: 409 10*3/uL — ABNORMAL HIGH (ref 150–400)
RBC: 4.6 MIL/uL (ref 3.87–5.11)
RDW: 14.4 % (ref 11.5–15.5)
WBC: 12.9 10*3/uL — ABNORMAL HIGH (ref 4.0–10.5)
nRBC: 0 % (ref 0.0–0.2)

## 2018-11-27 SURGERY — ANTERIOR CERVICAL DECOMPRESSION/DISCECTOMY FUSION 1 LEVEL
Anesthesia: General | Site: Spine Cervical

## 2018-11-27 MED ORDER — AMITRIPTYLINE HCL 50 MG PO TABS
100.0000 mg | ORAL_TABLET | Freq: Every day | ORAL | Status: DC
  Administered 2018-11-27: 100 mg via ORAL
  Filled 2018-11-27: qty 1

## 2018-11-27 MED ORDER — SODIUM CHLORIDE 0.9 % IV SOLN: 250.0000 mL | INTRAVENOUS | Status: DC

## 2018-11-27 MED ORDER — HEMOSTATIC AGENTS (NO CHARGE) OPTIME
TOPICAL | Status: DC | PRN
  Administered 2018-11-27: 1 via TOPICAL

## 2018-11-27 MED ORDER — PANTOPRAZOLE SODIUM 40 MG PO TBEC
40.0000 mg | DELAYED_RELEASE_TABLET | Freq: Every day | ORAL | Status: DC
  Administered 2018-11-27: 40 mg via ORAL
  Filled 2018-11-27: qty 1

## 2018-11-27 MED ORDER — CEFAZOLIN SODIUM-DEXTROSE 2-4 GM/100ML-% IV SOLN
2.0000 g | INTRAVENOUS | Status: AC
  Administered 2018-11-27: 2 g via INTRAVENOUS

## 2018-11-27 MED ORDER — SODIUM CHLORIDE 0.9 % IV SOLN
INTRAVENOUS | Status: DC | PRN
  Administered 2018-11-27: 25 ug/min via INTRAVENOUS

## 2018-11-27 MED ORDER — HYDROCODONE-ACETAMINOPHEN 7.5-325 MG PO TABS
1.0000 | ORAL_TABLET | Freq: Four times a day (QID) | ORAL | Status: DC | PRN
  Administered 2018-11-27 – 2018-11-28 (×2): 1 via ORAL
  Filled 2018-11-27 (×2): qty 1

## 2018-11-27 MED ORDER — SUGAMMADEX SODIUM 200 MG/2ML IV SOLN
INTRAVENOUS | Status: DC | PRN
  Administered 2018-11-27: 272 mg via INTRAVENOUS

## 2018-11-27 MED ORDER — ONDANSETRON HCL 4 MG/2ML IJ SOLN
INTRAMUSCULAR | Status: AC
  Filled 2018-11-27: qty 2

## 2018-11-27 MED ORDER — ONDANSETRON HCL 4 MG/2ML IJ SOLN: 4.0000 mg | Freq: Once | INTRAMUSCULAR | Status: DC | PRN

## 2018-11-27 MED ORDER — POLYETHYLENE GLYCOL 3350 17 G PO PACK: 17.0000 g | PACK | Freq: Every day | ORAL | Status: DC

## 2018-11-27 MED ORDER — ROCURONIUM BROMIDE 10 MG/ML (PF) SYRINGE
PREFILLED_SYRINGE | INTRAVENOUS | Status: DC | PRN
  Administered 2018-11-27: 50 mg via INTRAVENOUS
  Administered 2018-11-27: 20 mg via INTRAVENOUS

## 2018-11-27 MED ORDER — AMLODIPINE BESYLATE 5 MG PO TABS: 5.0000 mg | ORAL_TABLET | Freq: Every day | ORAL | Status: DC

## 2018-11-27 MED ORDER — ONDANSETRON HCL 4 MG PO TABS: 4.0000 mg | ORAL_TABLET | Freq: Four times a day (QID) | ORAL | Status: DC | PRN

## 2018-11-27 MED ORDER — LACTATED RINGERS IV SOLN
INTRAVENOUS | Status: DC
  Administered 2018-11-27 (×2): via INTRAVENOUS

## 2018-11-27 MED ORDER — FENTANYL CITRATE (PF) 250 MCG/5ML IJ SOLN
INTRAMUSCULAR | Status: AC
  Filled 2018-11-27: qty 5

## 2018-11-27 MED ORDER — ATORVASTATIN CALCIUM 40 MG PO TABS: 40.0000 mg | ORAL_TABLET | ORAL | Status: DC

## 2018-11-27 MED ORDER — CHLORHEXIDINE GLUCONATE 4 % EX LIQD: 60.0000 mL | Freq: Once | CUTANEOUS | Status: DC

## 2018-11-27 MED ORDER — DEXAMETHASONE SODIUM PHOSPHATE 10 MG/ML IJ SOLN
INTRAMUSCULAR | Status: DC | PRN
  Administered 2018-11-27: 10 mg via INTRAVENOUS

## 2018-11-27 MED ORDER — DOCUSATE SODIUM 100 MG PO CAPS
100.0000 mg | ORAL_CAPSULE | Freq: Two times a day (BID) | ORAL | Status: DC
  Administered 2018-11-27: 100 mg via ORAL
  Filled 2018-11-27: qty 1

## 2018-11-27 MED ORDER — ACETAMINOPHEN 650 MG RE SUPP: 650.0000 mg | RECTAL | Status: DC | PRN

## 2018-11-27 MED ORDER — PROPOFOL 10 MG/ML IV BOLUS
INTRAVENOUS | Status: DC | PRN
  Administered 2018-11-27: 120 mg via INTRAVENOUS

## 2018-11-27 MED ORDER — FENTANYL CITRATE (PF) 100 MCG/2ML IJ SOLN
INTRAMUSCULAR | Status: AC
  Filled 2018-11-27: qty 2

## 2018-11-27 MED ORDER — ALBUTEROL SULFATE HFA 108 (90 BASE) MCG/ACT IN AERS: 2.0000 | INHALATION_SPRAY | Freq: Four times a day (QID) | RESPIRATORY_TRACT | Status: DC | PRN

## 2018-11-27 MED ORDER — LIDOCAINE 2% (20 MG/ML) 5 ML SYRINGE
INTRAMUSCULAR | Status: AC
  Filled 2018-11-27: qty 5

## 2018-11-27 MED ORDER — SODIUM CHLORIDE 0.9% FLUSH: 3.0000 mL | Freq: Two times a day (BID) | INTRAVENOUS | Status: DC

## 2018-11-27 MED ORDER — ROCURONIUM BROMIDE 10 MG/ML (PF) SYRINGE
PREFILLED_SYRINGE | INTRAVENOUS | Status: AC
  Filled 2018-11-27: qty 10

## 2018-11-27 MED ORDER — HYDROCHLOROTHIAZIDE 25 MG PO TABS: 25.0000 mg | ORAL_TABLET | Freq: Every day | ORAL | Status: DC

## 2018-11-27 MED ORDER — BUPIVACAINE-EPINEPHRINE 0.5% -1:200000 IJ SOLN
INTRAMUSCULAR | Status: DC | PRN
  Administered 2018-11-27: 30 mL

## 2018-11-27 MED ORDER — SODIUM CHLORIDE 0.9% FLUSH: 3.0000 mL | INTRAVENOUS | Status: DC | PRN

## 2018-11-27 MED ORDER — FENTANYL CITRATE (PF) 250 MCG/5ML IJ SOLN
INTRAMUSCULAR | Status: DC | PRN
  Administered 2018-11-27 (×2): 50 ug via INTRAVENOUS
  Administered 2018-11-27: 100 ug via INTRAVENOUS
  Administered 2018-11-27: 50 ug via INTRAVENOUS

## 2018-11-27 MED ORDER — ONDANSETRON HCL 4 MG/2ML IJ SOLN
INTRAMUSCULAR | Status: DC | PRN
  Administered 2018-11-27: 4 mg via INTRAVENOUS

## 2018-11-27 MED ORDER — BUPIVACAINE-EPINEPHRINE (PF) 0.5% -1:200000 IJ SOLN
INTRAMUSCULAR | Status: AC
  Filled 2018-11-27: qty 30

## 2018-11-27 MED ORDER — FAMOTIDINE 20 MG PO TABS: 20.0000 mg | ORAL_TABLET | Freq: Every day | ORAL | Status: DC

## 2018-11-27 MED ORDER — MENTHOL 3 MG MT LOZG
1.0000 | LOZENGE | OROMUCOSAL | Status: DC | PRN
  Filled 2018-11-27: qty 9

## 2018-11-27 MED ORDER — ATENOLOL 50 MG PO TABS: 50.0000 mg | ORAL_TABLET | Freq: Every day | ORAL | Status: DC

## 2018-11-27 MED ORDER — FENTANYL CITRATE (PF) 100 MCG/2ML IJ SOLN
25.0000 ug | INTRAMUSCULAR | Status: DC | PRN
  Administered 2018-11-27 (×2): 25 ug via INTRAVENOUS

## 2018-11-27 MED ORDER — ALPRAZOLAM 0.25 MG PO TABS: 0.0625 mg | ORAL_TABLET | Freq: Every day | ORAL | Status: DC | PRN

## 2018-11-27 MED ORDER — VITAMIN D 25 MCG (1000 UNIT) PO TABS: 1000.0000 [IU] | ORAL_TABLET | Freq: Every day | ORAL | Status: DC

## 2018-11-27 MED ORDER — ONDANSETRON HCL 4 MG/2ML IJ SOLN: 4.0000 mg | Freq: Four times a day (QID) | INTRAMUSCULAR | Status: DC | PRN

## 2018-11-27 MED ORDER — DEXAMETHASONE SODIUM PHOSPHATE 10 MG/ML IJ SOLN
INTRAMUSCULAR | Status: AC
  Filled 2018-11-27: qty 1

## 2018-11-27 MED ORDER — 0.9 % SODIUM CHLORIDE (POUR BTL) OPTIME
TOPICAL | Status: DC | PRN
  Administered 2018-11-27: 1000 mL

## 2018-11-27 MED ORDER — CEFAZOLIN SODIUM-DEXTROSE 1-4 GM/50ML-% IV SOLN
1.0000 g | Freq: Three times a day (TID) | INTRAVENOUS | Status: AC
  Administered 2018-11-27 – 2018-11-28 (×2): 1 g via INTRAVENOUS
  Filled 2018-11-27 (×2): qty 50

## 2018-11-27 MED ORDER — OXYCODONE HCL 5 MG/5ML PO SOLN: 5.0000 mg | Freq: Once | ORAL | Status: DC | PRN

## 2018-11-27 MED ORDER — EZETIMIBE 10 MG PO TABS
10.0000 mg | ORAL_TABLET | Freq: Every day | ORAL | Status: DC
  Administered 2018-11-27: 10 mg via ORAL
  Filled 2018-11-27: qty 1

## 2018-11-27 MED ORDER — LOSARTAN POTASSIUM 50 MG PO TABS: 100.0000 mg | ORAL_TABLET | Freq: Every day | ORAL | Status: DC

## 2018-11-27 MED ORDER — ACETAMINOPHEN 325 MG PO TABS: 650.0000 mg | ORAL_TABLET | ORAL | Status: DC | PRN

## 2018-11-27 MED ORDER — LIDOCAINE HCL (CARDIAC) PF 100 MG/5ML IV SOSY
PREFILLED_SYRINGE | INTRAVENOUS | Status: DC | PRN
  Administered 2018-11-27: 60 mg via INTRAVENOUS

## 2018-11-27 MED ORDER — PHENOL 1.4 % MT LIQD: 1.0000 | OROMUCOSAL | Status: DC | PRN

## 2018-11-27 MED ORDER — SODIUM CHLORIDE 0.9 % IV SOLN: INTRAVENOUS | Status: DC

## 2018-11-27 MED ORDER — OMEPRAZOLE MAGNESIUM 20 MG PO TBEC: 20.0000 mg | DELAYED_RELEASE_TABLET | Freq: Every day | ORAL | Status: DC

## 2018-11-27 MED ORDER — OXYCODONE HCL 5 MG PO TABS: 5.0000 mg | ORAL_TABLET | Freq: Once | ORAL | Status: DC | PRN

## 2018-11-27 SURGICAL SUPPLY — 56 items
BENZOIN TINCTURE PRP APPL 2/3 (GAUZE/BANDAGES/DRESSINGS) ×2 IMPLANT
BIT DRILL SRG 14X2.2XFLT CHK (BIT) ×1 IMPLANT
BIT DRL SRG 14X2.2XFLT CHK (BIT) ×1
BLADE CLIPPER SURG (BLADE) IMPLANT
BONE CERV LORDOTIC 14.5X12X6 (Bone Implant) ×2 IMPLANT
BONE CERV LORDOTIC 14.5X12X8 (Bone Implant) ×2 IMPLANT
BUR ROUND FLUTED 4 SOFT TCH (BURR) IMPLANT
CLSR STERI-STRIP ANTIMIC 1/2X4 (GAUZE/BANDAGES/DRESSINGS) ×2 IMPLANT
COLLAR CERV LO CONTOUR FIRM DE (SOFTGOODS) ×2 IMPLANT
COVER MAYO STAND STRL (DRAPES) ×2 IMPLANT
COVER SURGICAL LIGHT HANDLE (MISCELLANEOUS) ×2 IMPLANT
COVER WAND RF STERILE (DRAPES) ×2 IMPLANT
DRAPE C-ARM 42X72 X-RAY (DRAPES) ×2 IMPLANT
DRAPE HALF SHEET 40X57 (DRAPES) IMPLANT
DRAPE MICROSCOPE LEICA (MISCELLANEOUS) ×2 IMPLANT
DRILL BIT SKYLINE 14MM (BIT) ×1
DURAPREP 6ML APPLICATOR 50/CS (WOUND CARE) ×2 IMPLANT
ELECT COATED BLADE 2.86 ST (ELECTRODE) ×2 IMPLANT
ELECT REM PT RETURN 9FT ADLT (ELECTROSURGICAL) ×2
ELECTRODE REM PT RTRN 9FT ADLT (ELECTROSURGICAL) ×1 IMPLANT
EVACUATOR 1/8 PVC DRAIN (DRAIN) ×4 IMPLANT
GAUZE SPONGE 4X4 12PLY STRL (GAUZE/BANDAGES/DRESSINGS) ×2 IMPLANT
GAUZE SPONGE 4X4 12PLY STRL LF (GAUZE/BANDAGES/DRESSINGS) ×2 IMPLANT
GLOVE BIOGEL PI IND STRL 8 (GLOVE) ×2 IMPLANT
GLOVE BIOGEL PI INDICATOR 8 (GLOVE) ×2
GLOVE ORTHO TXT STRL SZ7.5 (GLOVE) ×6 IMPLANT
GOWN STRL REUS W/ TWL LRG LVL3 (GOWN DISPOSABLE) ×2 IMPLANT
GOWN STRL REUS W/ TWL XL LVL3 (GOWN DISPOSABLE) ×2 IMPLANT
GOWN STRL REUS W/TWL 2XL LVL3 (GOWN DISPOSABLE) ×2 IMPLANT
GOWN STRL REUS W/TWL LRG LVL3 (GOWN DISPOSABLE) ×2
GOWN STRL REUS W/TWL XL LVL3 (GOWN DISPOSABLE) ×2
HALTER HD/CHIN CERV TRACTION D (MISCELLANEOUS) ×2 IMPLANT
KIT BASIN OR (CUSTOM PROCEDURE TRAY) ×2 IMPLANT
KIT TURNOVER KIT B (KITS) ×2 IMPLANT
MANIFOLD NEPTUNE II (INSTRUMENTS) IMPLANT
NEEDLE 25GX 5/8IN NON SAFETY (NEEDLE) ×2 IMPLANT
NS IRRIG 1000ML POUR BTL (IV SOLUTION) ×2 IMPLANT
PACK ORTHO CERVICAL (CUSTOM PROCEDURE TRAY) ×2 IMPLANT
PAD ARMBOARD 7.5X6 YLW CONV (MISCELLANEOUS) ×4 IMPLANT
PATTIES SURGICAL .5 X.5 (GAUZE/BANDAGES/DRESSINGS) IMPLANT
PIN TEMP SKYLINE THREADED (PIN) ×2 IMPLANT
PLATE ONE LEVEL SKYLINE 14MM (Plate) ×2 IMPLANT
PLATE TWO LEVEL SKYLINE 30MM (Plate) ×2 IMPLANT
POSITIONER HEAD DONUT 9IN (MISCELLANEOUS) ×2 IMPLANT
SCREW VARIABLE SELF TAP 12MM (Screw) ×2 IMPLANT
STRIP CLOSURE SKIN 1/2X4 (GAUZE/BANDAGES/DRESSINGS) ×2 IMPLANT
SURGIFLO W/THROMBIN 8M KIT (HEMOSTASIS) ×2 IMPLANT
SUT BONE WAX W31G (SUTURE) ×2 IMPLANT
SUT VIC AB 3-0 PS2 18 (SUTURE) ×1
SUT VIC AB 3-0 PS2 18XBRD (SUTURE) ×1 IMPLANT
SUT VIC AB 4-0 PS2 27 (SUTURE) ×2 IMPLANT
SYR BULB 3OZ (MISCELLANEOUS) ×2 IMPLANT
TAPE CLOTH SURG 6X10 WHT LF (GAUZE/BANDAGES/DRESSINGS) ×2 IMPLANT
TOWEL GREEN STERILE (TOWEL DISPOSABLE) ×2 IMPLANT
TOWEL GREEN STERILE FF (TOWEL DISPOSABLE) ×2 IMPLANT
WATER STERILE IRR 1000ML POUR (IV SOLUTION) ×2 IMPLANT

## 2018-11-27 NOTE — Progress Notes (Signed)
Patient attempted to give urine sample but was unsuccessful, patient stated that she will try again at 12 pm

## 2018-11-27 NOTE — Anesthesia Postprocedure Evaluation (Signed)
Anesthesia Post Note  Patient: Heidi Hobbs  Procedure(s) Performed: CERVICAL FIVE TO SIX , CERVICAL SIX TO SEVEN, ANTERIOR CERVICAL DECOMPRESSION/DISCECTOMY FUSION, ALLOGRAFT, PLATE (N/A Spine Cervical)     Patient location during evaluation: PACU Anesthesia Type: General Level of consciousness: awake and alert Pain management: pain level controlled Vital Signs Assessment: post-procedure vital signs reviewed and stable Respiratory status: spontaneous breathing, nonlabored ventilation, respiratory function stable and patient connected to nasal cannula oxygen Cardiovascular status: blood pressure returned to baseline and stable Postop Assessment: no apparent nausea or vomiting Anesthetic complications: no    Last Vitals:  Vitals:   11/27/18 1630 11/27/18 1649  BP:  (!) 155/65  Pulse: 72 72  Resp: 14 18  Temp: 36.7 C 36.6 C  SpO2: 94% 96%    Last Pain:  Vitals:   11/27/18 1924  TempSrc:   PainSc: 6                  Arn Mcomber COKER

## 2018-11-27 NOTE — Progress Notes (Signed)
Relief of pre-op severe arm pain noted in PACU. MAE, good strength. Discussed with daughter 2 levels done instead of single C5-6 as we had planned. Copy of xrays pre and post op given as well as MRI report which I had reviewed pre-op with her.  She was present at [facility] when we discussed possibly including C6-7 which moderate stenosis AP diameter 8 mm and severe stenosis left but her pain was severe on right and C5-6 had 6 mm canal AP diameter.discussed with pt as well.

## 2018-11-27 NOTE — H&P (Signed)
Office Visit Note  Patient: Heidi Hobbs  Date of Birth: 13-May-1940  MRN: [id number]  Visit Date: 11/22/2018  Requested by: Shon Baton, [address]  [place], [place] [address]  PCP: Shon Baton, MD  Assessment & Plan:  Visit Diagnoses:  1. Spinal stenosis of cervical region   2. Foraminal stenosis of cervical region   Plan: Patient has single level moderate central stenosis C5-6 with severe by foraminal stenosis C5-6. Mild foraminal narrowing at other levels but no significant compression other than at C5-6. Prednisone Dosepak prescribed. Patient states she like to proceed with operative intervention due to her severe uncontrolled pain. Surgical treatment was discussed with single level fusion allograft and plate with overnight stay in the hospital at the C5-6 level. Risks of dysphasia dysphonia use of the postoperative collar discussed questions were elicited and answered. She understands and requests we proceed.  Follow-Up Instructions: Patient will follow-up 1 week after her C5-6 anterior cervical discectomy and fusion, allograft and plate.  Orders:  No orders of the defined types were placed in this encounter.      Meds ordered this encounter  Medications  . predniSONE (STERAPRED UNI-PAK 21 TAB) 10 MG (21) TBPK tablet    Sig: Take with food daily 6 tablets first day , then 5,4,3,2,1 decreasing one tablet daily with food.    Dispense: 21 tablet    Refill: 0  Procedures:  No procedures performed  Clinical Data:  No additional findings.  Subjective:     Chief Complaint  Patient presents with  . Neck - Pain  HPI 78 year old female seen with ongoing severe neck pain right arm pain right arm weakness. She has been crying she is used Tylenol 3, Ultram, anti-inflammatories, Tylenol, topical creams without relief. Dr. Marlou Sa referred her to me for anterior cervical fusion at C5-6 due to central disc protrusion and severe by neural foraminal compression with moderate central  compression. She states she cannot get comfortable is unable to tolerate the pain. Her daughter is with her relating identical history for the patient. Patient states she cannot take it any longer and something needs to be done. Pulmonary function test FVC 95% predicted FEV1 90% predicted June 2019.  Review of Systems 14 point review of systems positive for history of bronchiectasis, COPD Gold 0. Positive for hypertension. Previous perforated appendix with peritoneal abscess. Macular degeneration. Negative for chest pain, negative for angina, 2D echo 2000 60% ejection fraction. Past 1/2 pack/day smoker x50 years quit May 2019.  Objective:  Vital Signs: BP (!) 154/73  Pulse 89  Ht _0  (1.727 m)  Wt 150 lb (68 kg)  BMI 22.81 kg/m  Physical Exam  Constitutional:  Appearance: She is well-developed.  HENT:  Head: Normocephalic.  Right Ear: External ear normal.  Left Ear: External ear normal.  Eyes:  Pupils: Pupils are equal, round, and reactive to light.  Neck:  Thyroid: No thyromegaly.  Trachea: No tracheal deviation.  Cardiovascular:  Rate and Rhythm: Normal rate.  Pulmonary:  Effort: Pulmonary effort is normal.  Abdominal:  Palpations: Abdomen is soft.  Skin:  General: Skin is warm and dry.  Neurological:  Mental Status: She is alert and oriented to person, place, and time.  Psychiatric:  Behavior: Behavior normal.   Ortho Exam patient has sharp pain with forward flexion more pain with extension. Severe pain with cervical compression minimal improvement with distraction. She has severe brachial plexus tenderness on the right. Patient has decreased sensation radial 3 fingers of her  right hand. Carpal tunnel exam is negative. Ulnar nerve at the elbow is normal. Negative impingement of the shoulder. Patient has mild biceps weakness on the right normal on the left slight wrist extension weakness on the right negative on the left. Interossei are normal. Normal finger flexion.  Specialty  Comments:  No specialty comments available.  Imaging:  CLINICAL DATA: Radiculopathy, persistent symptoms  EXAM:  MRI CERVICAL SPINE WITHOUT CONTRAST  TECHNIQUE:  Multiplanar, multisequence MR imaging of the cervical spine was  performed. No intravenous contrast was administered.  COMPARISON: None.  FINDINGS:  Alignment: Physiologic  Vertebrae: The vertebral body heights are well maintained. No  fracture, marrow edema,or pathologic marrow infiltration.  Cord: Normal signal and morphology.  Posterior Fossa, vertebral arteries, paraspinal tissues:  The visualized portion of the posterior fossa is unremarkable.  Normal flow voids seen within the vertebral arteries. The paraspinal  soft tissues are unremarkable.  Disc levels:  C1-C2: Atlanto-axial junction is normal, without canal narrowing  C2-C3: No significant spinal canal or neural foraminal narrowing  C3-C4: There is a minimal disc osteophyte complex and uncovertebral  osteophytes which causes mild left neural foraminal narrowing.  C4-C5: There is a disc osteophyte complex and uncovertebral  osteophytes which causes severe left and mild right neural foraminal  narrowing. There is mild effacement anterior thecal sac.  C5-C6: There is a disc osteophyte complex and uncovertebral  osteophytes with a central disc protrusion. There is severe  bilateral neural foraminal narrowing, left greater than right. The  central thecal sac measures 6 mm in AP diameter.  C6-C7: There is a disc osteophyte complex and uncovertebral  osteophytes which causes severe left and mild right neural foraminal  narrowing. There is mild effacement of the anterior thecal sac which  measures 8 mm in AP diameter.  C7-T1: No significant spinal canal or neural foraminal narrowing  IMPRESSION:  Cervical spine spondylosis most notable at C5-C6 with a central disc  protrusion, severe bilateral neural foraminal narrowing and mild to  moderate central canal stenosis.   Electronically Signed  By: Prudencio Pair M.D.  On: 11/15/2018 12:39  Previous cervical spine x-rays 10/18/2018 showed slight narrowing with anterior posterior spurring at C5-6 C6-7 without listhesis. Uncovertebral changes and facet arthropathy was noted.  PMFS History:      Patient Active Problem List   Diagnosis Date Noted  . Spinal stenosis of cervical region 11/22/2018  . Foraminal stenosis of cervical region 11/22/2018  . Dysuria 04/08/2018  . Cigarette smoker 07/24/2017  . Venous stasis ulcer ([facility]) 05/25/2017  . Atherosclerosis of native arteries of extremity with intermittent claudication ([facility]) 05/25/2017  . Occlusion and stenosis of vertebral artery 05/14/2016  . Aortic arch atherosclerosis ([facility]) 05/14/2016  . Essential hypertension 02/13/2016  . Vasculitis ([facility]) 02/13/2016  . Macular degeneration, dry 02/13/2016  . Acute appendicitis with perforation and peritoneal abscess 01/23/2016  . Carotid artery disease ([facility]) 07/31/2015  . Bronchiectasis without acute exacerbation ([facility]) 08/18/2012  . Cough 08/18/2012  . COPD GOLD 0 08/17/2012       Past Medical History:  Diagnosis Date  . Adenomatous colon polyp 1995, 2011  . Anxiety   . Carotid stenosis    40-59% right, less than 40% left  . Cataracts, bilateral   . COPD (chronic obstructive pulmonary disease) ([facility])   . Essential hypertension 02/13/2016  . Hyperlipidemia   . Hypertension   . MAC (mycobacterium avium-intracellulare complex)   . Macular degeneration, dry 02/13/2016  . Polio 1959  . Stomach ulcer 1960  .  Vasculitis ([facility])    Normocomplementemic urticarial   . Vasculitis ([facility]) 02/13/2016   Follow[facility] by [facility]         Family History  Problem Relation Age of Onset  . Colon cancer Father   . Esophageal cancer Brother   . Esophageal cancer Brother   . Asthma Daughter    as a child  . Stomach cancer Neg Hx   . Rectal cancer Neg Hx         Past Surgical History:  Procedure Laterality Date  .  BREAST ENHANCEMENT SURGERY     removal  . CATARACT EXTRACTION    . COLONOSCOPY    . COLONOSCOPY  10/14/09  . LAPAROSCOPIC APPENDECTOMY N/A 01/23/2016   Procedure: APPENDECTOMY LAPAROSCOPIC; Surgeon: Erroll Luna, MD; Location: [facility]; Service: General; Laterality: N/A;  . POLYPECTOMY  2011   x8  . TONSILLECTOMY AND ADENOIDECTOMY    . VAGINAL HYSTERECTOMY  1970   uterine cancer   Social History        Occupational History  . 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 Smoking status: Former Smoker    Packs/day: 0.50    Years: 50.00    Pack years: 25.00    Types: Cigarettes    Quit date: 08/01/2017    Years since quitting: 1.3  . Smokeless tobacco: Never Used  Substance and Sexual Activity  . Alcohol use: No    Alcohol/week: 0.0 standard drinks  . Drug use: No  . Sexual activity: Not on file

## 2018-11-27 NOTE — Transfer of Care (Signed)
Immediate Anesthesia Transfer of Care Note  Patient: Heidi Hobbs  Procedure(s) Performed: CERVICAL FIVE TO SIX , CERVICAL SIX TO SEVEN, ANTERIOR CERVICAL DECOMPRESSION/DISCECTOMY FUSION, ALLOGRAFT, PLATE (N/A Spine Cervical)  Patient Location: PACU  Anesthesia Type:General  Level of Consciousness: awake, alert , oriented and patient cooperative  Airway & Oxygen Therapy: Patient Spontanous Breathing and Patient connected to face mask oxygen  Post-op Assessment: Report given to RN and Post -op Vital signs reviewed and stable  Post vital signs: Reviewed and stable  Last Vitals:  Vitals Value Taken Time  BP 172/68 11/27/18 1520  Temp    Pulse 76 11/27/18 1522  Resp 14 11/27/18 1522  SpO2 97 % 11/27/18 1522  Vitals shown include unvalidated device data.  Last Pain:  Vitals:   11/27/18 1056  TempSrc:   PainSc: 0-No pain      Patients Stated Pain Goal: 4 ([id number])  Complications: No apparent anesthesia complications

## 2018-11-27 NOTE — Anesthesia Procedure Notes (Signed)
Procedure Name: Intubation Date/Time: 11/27/2018 1:10 PM Performed by: Raenette Rover, CRNA Pre-anesthesia Checklist: Patient identified, Emergency Drugs available, Suction available and Patient being monitored Patient Re-evaluated:Patient Re-evaluated prior to induction Oxygen Delivery Method: Circle system utilized Preoxygenation: Pre-oxygenation with 100% oxygen Induction Type: IV induction Ventilation: Mask ventilation without difficulty and Oral airway inserted - appropriate to patient size Laryngoscope Size: Sabra Heck and 2 Grade View: Grade I Tube type: Oral Tube size: 7.0 mm Number of attempts: 1 Airway Equipment and Method: Stylet Placement Confirmation: ETT inserted through vocal cords under direct vision,  positive ETCO2 and breath sounds checked- equal and bilateral Secured at: 21 cm Tube secured with: Tape Dental Injury: Teeth and Oropharynx as per pre-operative assessment

## 2018-11-27 NOTE — Anesthesia Preprocedure Evaluation (Addendum)
Anesthesia Evaluation  Patient identified by MRN, date of birth, ID band Patient awake    Reviewed: Allergy & Precautions, NPO status , Patient's Chart, lab work & pertinent test results  Airway Mallampati: II  TM Distance: >3 FB Neck ROM: Full    Dental  (+) Edentulous Upper, Edentulous Lower   Pulmonary former smoker,    breath sounds clear to auscultation       Cardiovascular hypertension,  Rhythm:Regular Rate:Normal     Neuro/Psych    GI/Hepatic   Endo/Other    Renal/GU      Musculoskeletal   Abdominal   Peds  Hematology   Anesthesia Other Findings   Reproductive/Obstetrics                            Anesthesia Physical Anesthesia Plan  ASA: III  Anesthesia Plan: General   Post-op Pain Management:    Induction: Intravenous  PONV Risk Score and Plan: Ondansetron and Dexamethasone  Airway Management Planned: Oral ETT  Additional Equipment:   Intra-op Plan:   Post-operative Plan:   Informed Consent: I have reviewed the patients History and Physical, chart, labs and discussed the procedure including the risks, benefits and alternatives for the proposed anesthesia with the patient or authorized representative who has indicated his/her understanding and acceptance.     Dental advisory given  Plan Discussed with: CRNA and Anesthesiologist  Anesthesia Plan Comments:         Anesthesia Quick Evaluation

## 2018-11-27 NOTE — Op Note (Signed)
Preop diagnosis: Cervical spondylosis with severe foraminal stenosis and radiculopathy.  Postop diagnosis: Same  Procedure: C5-6, C6-7 anterior cervical discectomy and fusion, allograft and plate.  Surgeon: Rodell Perna, MD  Assistant: Benjiman Core, PA-C medically necessary and present for the entire procedure  Anesthesia: General oral tracheal.  EBL: 50 cc  Drains: 1 Hemovac neck  Complications: Additional level fused C6-7 which had severe foraminal stenosis.  Implants: Synthes skyline 30 mm plate 12 mm screws x6. VG-2 8 mm graft at C6-7 and 6 mm graft at C5-6.  Procedure: After induction general anesthesia orotracheal intubation wrist restraints applied but no pulldown needed during the case yellow pads over the ulnar nerves had ultra traction application without weight neck was prepped with DuraPrep timeout procedure was completed.  Fluoroscopic images were displayed and preop plain radiographs were taped up for localization.  Ancef was given prophylactically and after prepping the neck there is squared with towel sterile skin marker was used based on palpable landmarks for incision starting at the midline extending to the left Betadine Steri-Drape application sterile female standard the head and thyroid sheets and drapes.  Timeout procedure was completed.  Incision was made at the midline extending to the left platysma was divided in line with the fibers.  Blunt dissection down to the large prominent spur was performed and a short 25 needle was placed and sterilely draped C arm was brought in spot image was obtained and levels interpreted as C5-6.  Preoperatively I discussed with patient also her daughter planned surgical procedure.  Patient had central stenosis to 6 mm at C5-6 with severe by foraminal stenosis.  At C6-7 she had 8 mm less severe stenosis and severe foraminal stenosis worse on the left than right.  Preoperatively patient was having more right arm symptoms and left and I discussed  with patient and her daughter 2 level versus 1 level and we decided on one level at C5-6 level.  Image was incorrectly counted with short 25 needle crosstable lateral C arm image showing C3 which was misinterpreted as C2 and self-retaining retractors were placed anterior spurs were removed operative microscope was draped and brought in and discectomy was performed progressing back to the posterior level where a large amount of extruded disc was found causing spinal stenosis and additional bone had to be removed at this level to remove disc material which was worse in the right side than the left with complete decompression of the dura and to around tube.  Trial sizers were 6 7 and then 8 mm graft was selected and graft was inserted.  The plate was selected fixed with screws.  Operative wound was dry at this point repeat C arm images were obtained which is recognized with our team at the C6- 7 level and not the planned level at C5-6.  Self-retaining retractors were moved to the C5-6 level discectomy was performed at this level progressing back to the posterior longitudinal ligament taken down posterior longitudinal ligament.  Overhanging spurs were removed and disc material was removed decompressing the spinal stenosis at C5-6.  Trial sizer showed a 6 graft gave good fit.  The plate at [id number] had been left on until graft was fitted at C5-6 to prevent blood loss through the screw holes at this point plate was removed screws were saved and a 30 mm plate was selected checked under C arm with good position and sick screws were placed.  Traction was pulled by the CRNA whether grafts were inserted at each level  and both grafts were countersunk 2 mm and were stable.  Wound was irrigated.  Operative field was dry.  Hemovac was placed with a knot technique on the left side in line with the skin incision platysma closed with 3-0 Vicryl 4-0 Vicryl subcuticular closure tincture benzoin Steri-Strips..  4 x 4's tape soft cervical  collar.  I discussed with the patient's daughter that 2 level procedure had been performed rather than the planned 1 level.  Preoperatively we had discussed the stenosis and I decided on planning just fusing C5-6 since she had severe foraminal stenosis at both left and right.  Discussed with the patient in the PACU two-level fusion has been performed instead of 1.  Patient had good relief her preop arm pain.  Copy of the report was given to the daughter as well as postop fluoroscopic images of the grafts and plate.  Below is a copy of the patient's MRI scan from 11/15/2018.  CLINICAL DATA:  Radiculopathy, persistent symptoms  EXAM: MRI CERVICAL SPINE WITHOUT CONTRAST  TECHNIQUE: Multiplanar, multisequence MR imaging of the cervical spine was performed. No intravenous contrast was administered.  COMPARISON:  None.  FINDINGS: Alignment: Physiologic  Vertebrae: The vertebral body heights are well maintained. No fracture, marrow edema,or pathologic marrow infiltration.  Cord: Normal signal and morphology.  Posterior Fossa, vertebral arteries, paraspinal tissues:  The visualized portion of the posterior fossa is unremarkable. Normal flow voids seen within the vertebral arteries. The paraspinal soft tissues are unremarkable.  Disc levels:  C1-C2: Atlanto-axial junction is normal, without canal narrowing  C2-C3: No significant spinal canal or neural foraminal narrowing  C3-C4: There is a minimal disc osteophyte complex and uncovertebral osteophytes which causes mild left neural foraminal narrowing.  C4-C5: There is a disc osteophyte complex and uncovertebral osteophytes which causes severe left and mild right neural foraminal narrowing. There is mild effacement anterior thecal sac.  C5-C6: There is a disc osteophyte complex and uncovertebral osteophytes with a central disc protrusion. There is severe bilateral neural foraminal narrowing, left greater than right.  The central thecal sac measures 6 mm in AP diameter.  C6-C7: There is a disc osteophyte complex and uncovertebral osteophytes which causes severe left and mild right neural foraminal narrowing. There is mild effacement of the anterior thecal sac which measures 8 mm in AP diameter.  C7-T1: No significant spinal canal or neural foraminal narrowing  IMPRESSION: Cervical spine spondylosis most notable at C5-C6 with a central disc protrusion, severe bilateral neural foraminal narrowing and mild to moderate central canal stenosis.   Electronically Signed   By: Prudencio Pair M.D.   On: 11/15/2018 12:39  Patient had full strength in the recovery room good relief of her severe excruciating arm pain that she had preoperatively.  She was transferred recovery room in stable condition and then to the floor.

## 2018-11-27 NOTE — Interval H&P Note (Signed)
History and Physical Interval Note:  11/27/2018 12:56 PM  Heidi Hobbs  has presented today for surgery, with the diagnosis of Spinal stenosis of cervical region Foraminal stenosis of CERVICAL REGION.  The various methods of treatment have been discussed with the patient and family. After consideration of risks, benefits and other options for treatment, the patient has consented to  Procedure(s): CERVICAL FIVE TO SIX ANTERIOR CERVICAL DECOMPRESSION/DISCECTOMY FUSION, ALLOGRAFT, PLATE (N/A) as a surgical intervention.  The patient's history has been reviewed, patient examined, no change in status, stable for surgery.  I have reviewed the patient's chart and labs.  Questions were answered to the patient's satisfaction.     [name]

## 2018-11-28 ENCOUNTER — Ambulatory Visit: Payer: Medicare Other | Admitting: Physical Therapy

## 2018-11-28 DIAGNOSIS — I70219 Atherosclerosis of native arteries of extremities with intermittent claudication, unspecified extremity: Secondary | ICD-10-CM | POA: Diagnosis not present

## 2018-11-28 DIAGNOSIS — M4722 Other spondylosis with radiculopathy, cervical region: Secondary | ICD-10-CM | POA: Diagnosis not present

## 2018-11-28 DIAGNOSIS — M50122 Cervical disc disorder at C5-C6 level with radiculopathy: Secondary | ICD-10-CM | POA: Diagnosis not present

## 2018-11-28 DIAGNOSIS — F419 Anxiety disorder, unspecified: Secondary | ICD-10-CM | POA: Diagnosis not present

## 2018-11-28 DIAGNOSIS — Z79899 Other long term (current) drug therapy: Secondary | ICD-10-CM | POA: Diagnosis not present

## 2018-11-28 DIAGNOSIS — Z87891 Personal history of nicotine dependence: Secondary | ICD-10-CM | POA: Diagnosis not present

## 2018-11-28 DIAGNOSIS — M4802 Spinal stenosis, cervical region: Secondary | ICD-10-CM | POA: Diagnosis not present

## 2018-11-28 DIAGNOSIS — J439 Emphysema, unspecified: Secondary | ICD-10-CM | POA: Diagnosis not present

## 2018-11-28 DIAGNOSIS — H3530 Unspecified macular degeneration: Secondary | ICD-10-CM | POA: Diagnosis not present

## 2018-11-28 DIAGNOSIS — Z91012 Allergy to eggs: Secondary | ICD-10-CM | POA: Diagnosis not present

## 2018-11-28 DIAGNOSIS — E785 Hyperlipidemia, unspecified: Secondary | ICD-10-CM | POA: Diagnosis not present

## 2018-11-28 DIAGNOSIS — I10 Essential (primary) hypertension: Secondary | ICD-10-CM | POA: Diagnosis not present

## 2018-11-28 DIAGNOSIS — I700 Atherosclerosis of aorta: Secondary | ICD-10-CM | POA: Diagnosis not present

## 2018-11-28 MED ORDER — HYDROCODONE-ACETAMINOPHEN 7.5-325 MG PO TABS
1.0000 | ORAL_TABLET | ORAL | Status: DC | PRN
  Administered 2018-11-28 (×2): 1 via ORAL
  Filled 2018-11-28 (×2): qty 1

## 2018-11-28 MED ORDER — HYDROCODONE-ACETAMINOPHEN 5-325 MG PO TABS: 1.0000 | ORAL_TABLET | Freq: Four times a day (QID) | ORAL | 0 refills | Status: DC | PRN

## 2018-11-28 NOTE — Evaluation (Addendum)
Occupational Therapy Evaluation Patient Details Name: Heidi Hobbs MRN: [id number] DOB: October 08, 1940 Today's Date: 11/28/2018    History of Present Illness Pt is an 78 yo female s/p C5-6, C6-7 ACDF. PMHx: HTN, CAD and COPD   Clinical Impression   Pt PTA: Living with daughter and caring for great grandchildren at home. Pt performing figure 4 technique to cross legs in order to perform LB ADL tasks. Pt very mobile and prefers ambulating with RW. Pt reminded of donning/doffing soft splint and requiring 2 cups for brushing teeth. Pt tolerating session very well.  Back handout provided and reviewed ADL in detail. Pt educated on: clothing between brace, never sleep in brace, set an alarm at night for medication, avoid sitting for long periods of time, correct bed positioning for sleeping, correct sequence for bed mobility, avoiding lifting more than 5 pounds and never wash directly over incision. All education is complete and patient indicates understanding. Pt does not require continued OT skilled services as pt at her functional baseline.  Recommending BSC for shower chair and for use as commode for safety.; and RW for greater stability and independence with the AD.    Follow Up Recommendations  No OT follow up;Supervision - Intermittent    Equipment Recommendations  3 in 1 bedside commode;Other (comment)(Rolling walker)    Recommendations for Other Services       Precautions / Restrictions Precautions Precautions: Cervical Precaution Booklet Issued: Yes (comment) Precaution Comments: verbally disussed and issued handout Required Braces or Orthoses: Cervical Brace Cervical Brace: Soft collar Restrictions Weight Bearing Restrictions: No      Mobility Bed Mobility Overal bed mobility: Needs Assistance Bed Mobility: Sidelying to Sit;Sit to Supine   Sidelying to sit: Supervision   Sit to supine: Supervision   General bed mobility comments: SupervisionA for  safety  Transfers Overall transfer level: Needs assistance   Transfers: Sit to/from Stand Sit to Stand: Supervision;Min guard         General transfer comment: miguardA for initial standing balance and pt more confident with RW    Balance Overall balance assessment: Needs assistance   Sitting balance-Leahy Scale: Good       Standing balance-Leahy Scale: Fair Standing balance comment: increased steadiness and speed with RW                           ADL either performed or assessed with clinical judgement   ADL Overall ADL's : At baseline                                       General ADL Comments: Pt performing figure 4 technique to cross legs in order to perform LB ADL tasks. Pt very mobile and prefers ambulating with RW. Pt reminded of donning.doffing soft splint and requiring 2 cups for brushing teeth. Pt tolerating session very well.     Vision Baseline Vision/History: No visual deficits Vision Assessment?: No apparent visual deficits     Perception     Praxis      Pertinent Vitals/Pain Pain Assessment: Faces Faces Pain Scale: Hurts a little bit Pain Location: neck Pain Descriptors / Indicators: Discomfort Pain Intervention(s): Limited activity within patient's tolerance     Hand Dominance Right   Extremity/Trunk Assessment Upper Extremity Assessment Upper Extremity Assessment: Generalized weakness;RUE deficits/detail RUE Deficits / Details: Splinted above elbow through MCPs. Shoulder, WFLs. Finger movement  mostly in flexion RUE: Unable to fully assess due to immobilization RUE Sensation: decreased light touch RUE Coordination: decreased fine motor   Lower Extremity Assessment Lower Extremity Assessment: Overall WFL for tasks assessed   Cervical / Trunk Assessment Cervical / Trunk Assessment: Normal   Communication Communication Communication: HOH   Cognition Arousal/Alertness: Awake/alert Behavior During Therapy: WFL  for tasks assessed/performed Overall Cognitive Status: Within Functional Limits for tasks assessed                                 General Comments: Folowing commands and spouse not present   General Comments       Exercises     Shoulder Instructions      Home Living Family/patient expects to be discharged to:: Private residence Living Arrangements: Children Available Help at Discharge: Family;Available PRN/intermittently Type of Home: House Home Access: Ramped entrance     Home Layout: Multi-level;Able to live on main level with bedroom/bathroom Alternate Level Stairs-Number of Steps: full flight   Bathroom Shower/Tub: Tub/shower unit;Walk-in shower   Bathroom Toilet: Handicapped height     Home Equipment: None          Prior Functioning/Environment Level of Independence: Independent                 OT Problem List: Decreased strength;Decreased activity tolerance;Impaired balance (sitting and/or standing);Decreased coordination;Decreased safety awareness;Pain      OT Treatment/Interventions:      OT Goals(Current goals can be found in the care plan section) Acute Rehab OT Goals Patient Stated Goal: to go home OT Goal Formulation: With patient  OT Frequency:     Barriers to D/C:            Co-evaluation              AM-PAC OT "6 Clicks" Daily Activity     Outcome Measure Help from another person eating meals?: None Help from another person taking care of personal grooming?: None Help from another person toileting, which includes using toliet, bedpan, or urinal?: None Help from another person bathing (including washing, rinsing, drying)?: A Little Help from another person to put on and taking off regular upper body clothing?: None Help from another person to put on and taking off regular lower body clothing?: None 6 Click Score: 23   End of Session Equipment Utilized During Treatment: Gait belt;Rolling walker;Cervical  collar Nurse Communication: Mobility status  Activity Tolerance: Patient tolerated treatment well Patient left: in bed;with call bell/phone within reach  OT Visit Diagnosis: Unsteadiness on feet (R26.81);Pain Pain - part of body: (back and neck)                Time: [id number] OT Time Calculation (min): 29 min Charges:  OT General Charges $OT Visit: 1 Visit OT Evaluation $OT Eval Moderate Complexity: 1 Mod OT Treatments $Self Care/Home Management : 8-22 mins  Ebony Hail Harold Hedge) Marsa Aris OTR/L Acute Rehabilitation Services Pager: [phone] Office: [phone] 11/28/2018, 9:00 AM

## 2018-11-28 NOTE — Discharge Summary (Signed)
Physician Discharge Summary  Patient ID: Heidi Hobbs MRN: [id number] DOB/AGE: 10/16/1940 78 y.o.  Admit date: 11/27/2018 Discharge date: 11/28/2018  Admission Diagnoses: Cervical stenosis with central and foraminal stenosis C5-6, C6-7.  Discharge Diagnoses:  Active Problems:   Spinal stenosis of cervical region   Foraminal stenosis of cervical region   Cervical spinal stenosis   Discharged Condition: Improved.  Good relief for preop arm pain with return of normal sensation.  Hospital Course: Patient preoperatively had severe uncontrolled neck and right arm pain with radiculopathy and cervical stenosis at 6 mm at C5-6 and 8 mm at C6-7.  She had by foraminal stenosis at C5-6 and severe left and mild right neural foraminal stenosis at C6-7.  Preoperatively I discussed doing 2 levels versus 1 level and daughter was present for the discussion as well.  Intraoperatively C6-7 was fused followed by C5-6 with findings of significant stenosis at both levels.  Postoperatively she was in a soft collar Hemovac was removed the following morning.  Patient had good relief of her preop pain and discussion with patient and daughter about the additional level fused.  Patient was happy with the surgical result.  Consults:   Significant Diagnostic Studies: pre-op MRI CLINICAL DATA:  Radiculopathy, persistent symptoms  EXAM: MRI CERVICAL SPINE WITHOUT CONTRAST  TECHNIQUE: Multiplanar, multisequence MR imaging of the cervical spine was performed. No intravenous contrast was administered.  COMPARISON:  None.  FINDINGS: Alignment: Physiologic  Vertebrae: The vertebral body heights are well maintained. No fracture, marrow edema,or pathologic marrow infiltration.  Cord: Normal signal and morphology.  Posterior Fossa, vertebral arteries, paraspinal tissues:  The visualized portion of the posterior fossa is unremarkable. Normal flow voids seen within the vertebral arteries. The  paraspinal soft tissues are unremarkable.  Disc levels:  C1-C2: Atlanto-axial junction is normal, without canal narrowing  C2-C3: No significant spinal canal or neural foraminal narrowing  C3-C4: There is a minimal disc osteophyte complex and uncovertebral osteophytes which causes mild left neural foraminal narrowing.  C4-C5: There is a disc osteophyte complex and uncovertebral osteophytes which causes severe left and mild right neural foraminal narrowing. There is mild effacement anterior thecal sac.  C5-C6: There is a disc osteophyte complex and uncovertebral osteophytes with a central disc protrusion. There is severe bilateral neural foraminal narrowing, left greater than right. The central thecal sac measures 6 mm in AP diameter.  C6-C7: There is a disc osteophyte complex and uncovertebral osteophytes which causes severe left and mild right neural foraminal narrowing. There is mild effacement of the anterior thecal sac which measures 8 mm in AP diameter.  C7-T1: No significant spinal canal or neural foraminal narrowing  IMPRESSION: Cervical spine spondylosis most notable at C5-C6 with a central disc protrusion, severe bilateral neural foraminal narrowing and mild to moderate central canal stenosis.   Electronically Signed   By: Prudencio Pair M.D.   On: 11/15/2018 12:39 CLINICAL DATA:  C5-C6 two C6-C7 anterior cervical disc fusion.  EXAM: CERVICAL SPINE - 2-3 VIEW; DG C-ARM 1-60 MIN  COMPARISON:  Radiographs, 10/18/2018.  Cervical MRI, 11/15/2018  FINDINGS: Three submitted. Portable spot fluoro graphic images demonstrate an anterior fusion plate spanning C5 through C7. Fixation screws are well-seated. Bone graft material is well positioned within the C5-C6 and C6-C7 disc interspaces.  IMPRESSION: Portable imaging for C5 through C7 anterior cervical disc fusion.   Electronically Signed   By: Lajean Manes M.D.   On: 11/27/2018  15:24 Treatments: C5-6, C6-7 anterior cervical discectomy and fusion with allograft  and plate.  Discharge Exam: Blood pressure (!) 147/72, pulse 91, temperature 98.7 F (37.1 C), temperature source Oral, resp. rate 16, height 5\' 8"  (1.727 m), weight 68 kg, SpO2 94 %. Physical exam: Postop patient had good relief of preop arm pain weakness and numbness.  She was ambulatory dressing was changed to her neck Hemovac was removed and patient was discharged in a soft collar.  Extra collar was given for showering.  Disposition: Discharged home with office follow-up 1 week.  Soft collar as above.   Allergies as of 11/28/2018      Reactions   Eggs Or Egg-derived Products    Skin nodules      Medication List    STOP taking these medications   acetaminophen 500 MG tablet Commonly known as: TYLENOL   acetaminophen-codeine 300-30 MG tablet Commonly known as: TYLENOL #3   gabapentin 100 MG capsule Commonly known as: NEURONTIN   predniSONE 10 MG (21) Tbpk tablet Commonly known as: STERAPRED UNI-PAK 21 TAB   traMADol 50 MG tablet Commonly known as: ULTRAM     TAKE these medications   albuterol 108 (90 Base) MCG/ACT inhaler Commonly known as: VENTOLIN HFA Inhale 1 puff into the lungs every 6 (six) hours as needed for wheezing or shortness of breath. What changed: how much to take   ALPRAZolam 0.25 MG tablet Commonly known as: XANAX Take 0.25 tablets by mouth daily as needed for anxiety.   amitriptyline 100 MG tablet Commonly known as: ELAVIL Take 100 mg by mouth at bedtime.   amLODipine 5 MG tablet Commonly known as: NORVASC Take 5 mg by mouth daily.   aspirin 81 MG EC tablet Take 81 mg by mouth daily.   atenolol 50 MG tablet Commonly known as: TENORMIN Take 50 mg by mouth daily.   atorvastatin 40 MG tablet Commonly known as: LIPITOR Take 40 mg by mouth every other day. At night   cephALEXin 250 MG capsule Commonly known as: KEFLEX Take 250 mg by mouth daily.    cholecalciferol 25 MCG (1000 UT) tablet Commonly known as: VITAMIN D3 Take 1,000 Units by mouth daily.   dicyclomine 10 MG capsule Commonly known as: BENTYL Take 1 capsule (10 mg total) by mouth 3 (three) times daily before meals.   docusate sodium 100 MG capsule Commonly known as: COLACE You can buy this at any drug store, over the counter and follow package instructions What changed:   how much to take  how to take this  when to take this  additional instructions   EPINEPHrine 0.3 mg/0.3 mL Soaj injection Commonly known as: EPI-PEN Inject 0.3 mLs into the muscle once as needed for anaphylaxis.   ezetimibe 10 MG tablet Commonly known as: ZETIA Take 10 mg by mouth at bedtime.   famotidine 20 MG tablet Commonly known as: PEPCID Take 20 mg by mouth daily.   hydrochlorothiazide 25 MG tablet Commonly known as: HYDRODIURIL Take 25 mg by mouth daily.   HYDROcodone-acetaminophen 5-325 MG tablet Commonly known as: Norco Take 1-2 tablets by mouth every 6 (six) hours as needed for moderate pain.   losartan 100 MG tablet Commonly known as: COZAAR Take 100 mg by mouth daily.   omeprazole 20 MG tablet Commonly known as: PRILOSEC OTC Take 20 mg by mouth at bedtime.   Probiotic Caps Take 1 capsule by mouth daily.   saccharomyces boulardii 250 MG capsule Commonly known as: FLORASTOR You can buy this over the counter at any drugstore and use for the  next month.   vitamin B-12 500 MCG tablet Commonly known as: CYANOCOBALAMIN Take 500 mcg by mouth daily.      Follow-up Information    [name], MD Follow up in 1 week(s).   Specialty: Orthopedic Surgery Contact information: [place] [address] [phone]           Signed: [name] 11/28/2018, 5:48 PM

## 2018-11-28 NOTE — Progress Notes (Signed)
   Subjective: 1 Day Post-Op Procedure(s) (LRB): CERVICAL FIVE TO SIX , CERVICAL SIX TO SEVEN, ANTERIOR CERVICAL DECOMPRESSION/DISCECTOMY FUSION, ALLOGRAFT, PLATE (N/A) Patient reports pain as mild.  "my pain is gone "  Objective: Vital signs in last 24 hours: Temp:  [97.9 F (36.6 C)-98.7 F (37.1 C)] 98.7 F (37.1 C) (09/15 0731) Pulse Rate:  [66-91] 91 (09/15 0731) Resp:  [11-21] 16 (09/15 0731) BP: (143-172)/(58-72) 147/72 (09/15 0731) SpO2:  [93 %-98 %] 94 % (09/15 0731) Weight:  [68 kg] 68 kg (09/14 1048)  Intake/Output from previous day: 09/14 0701 - 09/15 0700 In: 1500 [I.V.:1500] Out: 345 [Urine:300; Drains:20; Blood:25] Intake/Output this shift: No intake/output data recorded.  Recent Labs    11/27/18 1200  HGB 13.7   Recent Labs    11/27/18 1200  WBC 12.9*  RBC 4.60  HCT 40.7  PLT 409*   Recent Labs    11/27/18 1200  NA 138  K 3.1*  CL 101  CO2 23  BUN 10  CREATININE 0.72  GLUCOSE 102*  CALCIUM 8.9   No results for input(s): LABPT, INR in the last 72 hours.  Neurologically intact, DTR's intact. No weakness.  Dg Chest 2 View  Result Date: 11/27/2018 CLINICAL DATA:  Preoperative examination.  Patient for neck surgery. EXAM: CHEST - 2 VIEW COMPARISON:  PA and lateral chest 01/04/2018.  CT chest 10/13/2017. FINDINGS: The lungs are emphysematous. Small focus of scar in the right middle lobe is unchanged. No consolidative process, pneumothorax or effusion. Atherosclerosis noted. No acute or focal bony abnormality. IMPRESSION: No acute disease. Atherosclerosis. Emphysema. Electronically Signed   By: Inge Rise M.D.   On: 11/27/2018 10:38   Dg Cervical Spine 2-3 Views  Result Date: 11/27/2018 CLINICAL DATA:  C5-C6 two C6-C7 anterior cervical disc fusion. EXAM: CERVICAL SPINE - 2-3 VIEW; DG C-ARM 1-60 MIN COMPARISON:  Radiographs, 10/18/2018.  Cervical MRI, 11/15/2018 FINDINGS: Three submitted. Portable spot fluoro graphic images demonstrate an  anterior fusion plate spanning C5 through C7. Fixation screws are well-seated. Bone graft material is well positioned within the C5-C6 and C6-C7 disc interspaces. IMPRESSION: Portable imaging for C5 through C7 anterior cervical disc fusion. Electronically Signed   By: Lajean Manes M.D.   On: 11/27/2018 15:24   Dg C-arm 1-60 Min  Result Date: 11/27/2018 CLINICAL DATA:  C5-C6 two C6-C7 anterior cervical disc fusion. EXAM: CERVICAL SPINE - 2-3 VIEW; DG C-ARM 1-60 MIN COMPARISON:  Radiographs, 10/18/2018.  Cervical MRI, 11/15/2018 FINDINGS: Three submitted. Portable spot fluoro graphic images demonstrate an anterior fusion plate spanning C5 through C7. Fixation screws are well-seated. Bone graft material is well positioned within the C5-C6 and C6-C7 disc interspaces. IMPRESSION: Portable imaging for C5 through C7 anterior cervical disc fusion. Electronically Signed   By: Lajean Manes M.D.   On: 11/27/2018 15:24    Assessment/Plan: 1 Day Post-Op Procedure(s) (LRB): CERVICAL FIVE TO SIX , CERVICAL SIX TO SEVEN, ANTERIOR CERVICAL DECOMPRESSION/DISCECTOMY FUSION, ALLOGRAFT, PLATE (N/A) Plan: discharge home. HV pulled, dressing changed.   Heidi Hobbs 11/28/2018, 7:55 AM

## 2018-11-28 NOTE — Plan of Care (Signed)
Patient alert and oriented, mae's well, voiding adequate amount of urine, swallowing without difficulty, no c/o pain at time of discharge. Patient discharged home with family. Script and discharged instructions given to patient. Patient and family stated understanding of instructions given. Patient has an appointment with Dr. Yates  

## 2018-11-29 ENCOUNTER — Ambulatory Visit: Payer: Medicare Other | Admitting: Orthopedic Surgery

## 2018-11-30 ENCOUNTER — Encounter (HOSPITAL_COMMUNITY): Payer: Self-pay | Admitting: Orthopaedic Surgery

## 2018-12-05 ENCOUNTER — Encounter: Payer: Self-pay | Admitting: Orthopaedic Surgery

## 2018-12-05 ENCOUNTER — Ambulatory Visit (INDEPENDENT_AMBULATORY_CARE_PROVIDER_SITE_OTHER): Payer: Medicare Other | Admitting: Orthopaedic Surgery

## 2018-12-05 ENCOUNTER — Encounter: Payer: Medicare Other | Admitting: Physical Therapy

## 2018-12-05 ENCOUNTER — Ambulatory Visit (INDEPENDENT_AMBULATORY_CARE_PROVIDER_SITE_OTHER): Payer: Medicare Other

## 2018-12-05 VITALS — BP 128/72 | HR 98 | Ht 68.0 in | Wt 150.0 lb

## 2018-12-05 DIAGNOSIS — Z981 Arthrodesis status: Secondary | ICD-10-CM

## 2018-12-05 DIAGNOSIS — M7542 Impingement syndrome of left shoulder: Secondary | ICD-10-CM

## 2018-12-05 MED ORDER — HYDROCODONE-ACETAMINOPHEN 5-325 MG PO TABS: 1.0000 | ORAL_TABLET | Freq: Four times a day (QID) | ORAL | 0 refills | Status: DC | PRN

## 2018-12-05 MED ORDER — DICLOFENAC SODIUM 1 % TD GEL: 4.0000 g | Freq: Four times a day (QID) | TRANSDERMAL | Status: DC

## 2018-12-05 NOTE — Progress Notes (Signed)
Post-Op Visit Note   Patient: Heidi Hobbs           Date of Birth: 1940/03/21           MRN: [id number] Visit Date: 12/05/2018 PCP: Shon Baton, MD   Assessment & Plan: Postop two-level cervical fusion.  Incision looks good Steri-Strips changed.  Return in 6 weeks for lateral flexion-extension x-rays.  She has had some problems with her left shoulder with stiffness decreased range of motion and has some impingement present.  Negative drop arm test.  She can use some Voltaren cream on her shoulder 4 times a day.  Good relief of preop arm pain.  Patient is happy the surgical result.  Daughter is a CNA and has been helping with her care.  Chief Complaint:  Chief Complaint  Patient presents with  . Neck - Routine Post Op    11/27/2018 C5-6,C6-7 ACDF, Allograft, Plate   Visit Diagnoses:  1. Status post cervical spinal fusion   2. S/P cervical spinal fusion   3. Impingement syndrome of left shoulder     Plan: Return in 6 weeks for lateral flexion-extension C-spine x-ray on return visit.  If she is having some continued shoulder issues we can obtain x-rays of her shoulder as well.  Follow-Up Instructions: No follow-ups on file.   Orders:  Orders Placed This Encounter  Procedures  . XR Cervical Spine 2 or 3 views   Meds ordered this encounter  Medications  . diclofenac sodium (VOLTAREN) 1 % transdermal gel 4 g    Imaging: Xr Cervical Spine 2 Or 3 Views  Result Date: 12/05/2018 AP lateral cervical spine demonstrates C5-6, C6-7 two-level anterior cervical fusion with good position of plate and grafts. Impression: Satisfactory postop C5-C7 two-level cervical fusion.   PMFS History: Patient Active Problem List   Diagnosis Date Noted  . S/P cervical spinal fusion 12/05/2018  . Spinal stenosis of cervical region 11/22/2018  . Dysuria 04/08/2018  . Cigarette smoker 07/24/2017  . Venous stasis ulcer ([place]) 05/25/2017  . Atherosclerosis of native arteries of extremity with  intermittent claudication ([place]) 05/25/2017  . Occlusion and stenosis of vertebral artery 05/14/2016  . Aortic arch atherosclerosis ([place]) 05/14/2016  . Essential hypertension 02/13/2016  . Vasculitis ([place]) 02/13/2016  . Macular degeneration, dry 02/13/2016  . Acute appendicitis with perforation and peritoneal abscess 01/23/2016  . Carotid artery disease ([place]) 07/31/2015  . Bronchiectasis without acute exacerbation ([place]) 08/18/2012  . Cough 08/18/2012  . COPD GOLD 0 08/17/2012   Past Medical History:  Diagnosis Date  . Adenomatous colon polyp 1995, 2011  . Anemia   . Anxiety   . Cancer [facility])    uterine cancer  . Carotid stenosis    40-59% right, less than 40% left  . Cataracts, bilateral   . COPD (chronic obstructive pulmonary disease) ([place])   . Essential hypertension 02/13/2016  . Hyperlipidemia   . Hypertension   . MAC (mycobacterium avium-intracellulare complex)   . Macular degeneration, dry 02/13/2016  . Polio 1959  . Stomach ulcer 1960  . Vasculitis ([place])    Normocomplementemic urticarial   . Vasculitis ([place]) 02/13/2016   Followed by [facility]     Family History  Problem Relation Age of Onset  . Colon cancer Father   . Esophageal cancer Brother   . Esophageal cancer Brother   . Asthma Daughter        as a child  . Stomach cancer Neg Hx   . Rectal cancer  Neg Hx     Past Surgical History:  Procedure Laterality Date  . ANTERIOR CERVICAL DECOMP/DISCECTOMY FUSION N/A 11/27/2018   Procedure: CERVICAL FIVE TO SIX , CERVICAL SIX TO SEVEN, ANTERIOR CERVICAL DECOMPRESSION/DISCECTOMY FUSION, ALLOGRAFT, PLATE;  Surgeon: [name], MD;  Location: [place];  Service: Orthopedics;  Laterality: N/A;  . APPENDECTOMY     rupture appendix  . BREAST ENHANCEMENT SURGERY     removal  . CATARACT EXTRACTION    . COLONOSCOPY    . COLONOSCOPY  10/14/09  . LAPAROSCOPIC APPENDECTOMY N/A 01/23/2016   Procedure: APPENDECTOMY LAPAROSCOPIC;  Surgeon: Erroll Luna, MD;  Location:  [place];  Service: General;  Laterality: N/A;  . POLYPECTOMY  2011   x8  . TONSILLECTOMY AND ADENOIDECTOMY    . VAGINAL HYSTERECTOMY  1970   uterine cancer   Social History   Occupational History  . 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 Smoking status: Former Smoker    Packs/day: 0.50    Years: 50.00    Pack years: 25.00    Types: Cigarettes    Quit date: 08/01/2017    Years since quitting: 1.3  . Smokeless tobacco: Never Used  Substance and Sexual Activity  . Alcohol use: No    Alcohol/week: 0.0 standard drinks  . Drug use: No  . Sexual activity: Not on file

## 2018-12-06 ENCOUNTER — Telehealth: Payer: Self-pay | Admitting: Orthopaedic Surgery

## 2018-12-06 MED ORDER — DICLOFENAC SODIUM 1 % TD GEL: 4.0000 g | Freq: Four times a day (QID) | TRANSDERMAL | 0 refills | Status: DC

## 2018-12-06 NOTE — Telephone Encounter (Signed)
Pt called in said her pharmacy never received the prescription order for diclofenac sodium please have that sent to [facility] on [address]   [phone]

## 2018-12-06 NOTE — Telephone Encounter (Signed)
Please advise on any new instructions. I will resend voltaren gel to pharmacy.

## 2018-12-06 NOTE — Telephone Encounter (Signed)
Pt also said is she allowed to bend,twist, raise her arms. Any new instructions

## 2018-12-07 NOTE — Telephone Encounter (Signed)
Avoid activities with her shoulder that cause increased pain. Would walk daily and avoid liftng activities. thanks

## 2018-12-08 NOTE — Telephone Encounter (Signed)
, °

## 2018-12-08 NOTE — Telephone Encounter (Signed)
IC s/w patient and advised  

## 2018-12-12 ENCOUNTER — Encounter: Payer: Medicare Other | Admitting: Physical Therapy

## 2018-12-15 ENCOUNTER — Telehealth: Payer: Self-pay | Admitting: Orthopaedic Surgery

## 2018-12-15 ENCOUNTER — Telehealth: Payer: Self-pay | Admitting: Radiology

## 2018-12-15 NOTE — Telephone Encounter (Signed)
Juliann Pulse with [facility] called in to let dr.yates know they denied a prescription refill for Diclofenac Sodium Gel due to it not being FDA medically expected use.    [phone]

## 2018-12-15 NOTE — Telephone Encounter (Signed)
I called patient and advised. She states that she needs to see someone, that she is having continued pain and numbness and that it is really bothering her. She paid out of pocket for the voltaren gel and has not gotten any relief. I did explain that she is still recovering from surgery and sometimes it may take a while for symptoms to be gone and get better. She would like an appointment to be seen. Appt made with Benjiman Core, PA-C for next Wednesday morning when Dr. Louanne Skye will be in the office as well. Patient is aware Dr. Lorin Mercy is out of the office.

## 2018-12-15 NOTE — Telephone Encounter (Signed)
[facility] denied authorization for Voltaren Gel. Will advise patient that this is now available over the counter and she may obtain at her pharmacy that way.

## 2018-12-15 NOTE — Telephone Encounter (Signed)
noted 

## 2018-12-20 ENCOUNTER — Encounter: Payer: Self-pay | Admitting: Surgery

## 2018-12-20 ENCOUNTER — Ambulatory Visit (INDEPENDENT_AMBULATORY_CARE_PROVIDER_SITE_OTHER): Payer: Medicare Other | Admitting: Surgery

## 2018-12-20 DIAGNOSIS — M25511 Pain in right shoulder: Secondary | ICD-10-CM

## 2018-12-20 DIAGNOSIS — M2550 Pain in unspecified joint: Secondary | ICD-10-CM | POA: Diagnosis not present

## 2018-12-20 DIAGNOSIS — Z981 Arthrodesis status: Secondary | ICD-10-CM

## 2018-12-20 DIAGNOSIS — M25512 Pain in left shoulder: Secondary | ICD-10-CM

## 2018-12-20 MED ORDER — HYDROCODONE-ACETAMINOPHEN 5-325 MG PO TABS: 1.0000 | ORAL_TABLET | Freq: Four times a day (QID) | ORAL | 0 refills | Status: DC | PRN

## 2018-12-20 NOTE — Progress Notes (Signed)
Office Visit Note   Patient: Heidi Hobbs           Date of Birth: [birth year]           MRN: [id number] Visit Date: 12/20/2018              Requested by: Shon Baton, [place],  [place] [address] PCP: Shon Baton, MD   Assessment & Plan: Visit Diagnoses:  1. Polyarthralgia   2. S/P cervical spinal fusion   3. Bilateral shoulder pain, unspecified chronicity     Plan: Patient has about bilateral shoulder injections versus oral prednisone for her pain.  I advised her that I do not recommend immediate 1 with her recent surgery.  She is still early out.  I did get blood work today to check a CBC and arthritis panel due to her polyarthralgias.  Follow-up in a few weeks for recheck.  We will discuss lab results at that time.  We will also consider doing shoulder subacromial injections if she continues to be symptomatic there.  Follow-Up Instructions: Return in about 4 weeks (around 01/17/2019).   Orders:  Orders Placed This Encounter  Procedures  . CBC  . Uric acid  . Rheumatoid Factor  . Antinuclear Antib (ANA)  . Sed Rate (ESR)   Meds ordered this encounter  Medications  . HYDROcodone-acetaminophen (NORCO/VICODIN) 5-325 MG tablet    Sig: Take 1 tablet by mouth every 6 (six) hours as needed for moderate pain.    Dispense:  40 tablet    Refill:  0      Procedures: No procedures performed   Clinical Data: No additional findings.   Subjective: Chief Complaint  Patient presents with  . Neck - Routine Post Op  . Left Shoulder - Numbness, Pain  . Right Shoulder - Pain, Numbness    HPI 78 year old white female who is about 3 weeks status post two-level cervical fusion returns.  She continues have ongoing pain in both shoulders.  She describes having impingement symptoms.  She is asking about injections versus oral prednisone.  She also has some radicular symptoms in her bilateral hands but it sounds like most of her problems are centered around the  shoulders.  Needs refill of Norco.  States that she is also taken Aleve.  Review of Systems No current cardiac pulmonary GI GU issues  Objective: Vital Signs: There were no vitals taken for this visit.  Physical Exam Surgical incision anterior neck is well-healed.  Follow shoulders moderate to markedly positive impingement test.  Negative drop arm.  Some pain weakness with supraspinatus resistance.  Neurovascular intact. Ortho Exam  Specialty Comments:  No specialty comments available.  Imaging: No results found.   PMFS History: Patient Active Problem List   Diagnosis Date Noted  . S/P cervical spinal fusion 12/05/2018  . Spinal stenosis of cervical region 11/22/2018  . Dysuria 04/08/2018  . Cigarette smoker 07/24/2017  . Venous stasis ulcer ([place]) 05/25/2017  . Atherosclerosis of native arteries of extremity with intermittent claudication ([place]) 05/25/2017  . Occlusion and stenosis of vertebral artery 05/14/2016  . Aortic arch atherosclerosis ([place]) 05/14/2016  . Essential hypertension 02/13/2016  . Vasculitis ([place]) 02/13/2016  . Macular degeneration, dry 02/13/2016  . Acute appendicitis with perforation and peritoneal abscess 01/23/2016  . Carotid artery disease ([place]) 07/31/2015  . Bronchiectasis without acute exacerbation ([place]) 08/18/2012  . Cough 08/18/2012  . COPD GOLD 0 08/17/2012   Past Medical History:  Diagnosis Date  . Adenomatous  colon polyp 1995, 2011  . Anemia   . Anxiety   . Cancer [facility])    uterine cancer  . Carotid stenosis    40-59% right, less than 40% left  . Cataracts, bilateral   . COPD (chronic obstructive pulmonary disease) ([place])   . Essential hypertension 02/13/2016  . Hyperlipidemia   . Hypertension   . MAC (mycobacterium avium-intracellulare complex)   . Macular degeneration, dry 02/13/2016  . Polio 1959  . Stomach ulcer 1960  . Vasculitis ([place])    Normocomplementemic urticarial   . Vasculitis ([place]) 02/13/2016   Followed by [facility]     Family History  Problem Relation Age of Onset  . Colon cancer Father   . Esophageal cancer Brother   . Esophageal cancer Brother   . Asthma Daughter        as a child  . Stomach cancer Neg Hx   . Rectal cancer Neg Hx     Past Surgical History:  Procedure Laterality Date  . ANTERIOR CERVICAL DECOMP/DISCECTOMY FUSION N/A 11/27/2018   Procedure: CERVICAL FIVE TO SIX , CERVICAL SIX TO SEVEN, ANTERIOR CERVICAL DECOMPRESSION/DISCECTOMY FUSION, ALLOGRAFT, PLATE;  Surgeon: [name], MD;  Location: [place];  Service: Orthopedics;  Laterality: N/A;  . APPENDECTOMY     rupture appendix  . BREAST ENHANCEMENT SURGERY     removal  . CATARACT EXTRACTION    . COLONOSCOPY    . COLONOSCOPY  10/14/09  . LAPAROSCOPIC APPENDECTOMY N/A 01/23/2016   Procedure: APPENDECTOMY LAPAROSCOPIC;  Surgeon: Erroll Luna, MD;  Location: [place];  Service: General;  Laterality: N/A;  . POLYPECTOMY  2011   x8  . TONSILLECTOMY AND ADENOIDECTOMY    . VAGINAL HYSTERECTOMY  1970   uterine cancer   Social History   Occupational History  . 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 Smoking status: Former Smoker    Packs/day: 0.50    Years: 50.00    Pack years: 25.00    Types: Cigarettes    Quit date: 08/01/2017    Years since quitting: 1.3  . Smokeless tobacco: Never Used  Substance and Sexual Activity  . Alcohol use: No    Alcohol/week: 0.0 standard drinks  . Drug use: No  . Sexual activity: Not on file

## 2018-12-22 ENCOUNTER — Telehealth: Payer: Self-pay | Admitting: Surgery

## 2018-12-22 LAB — CBC
HCT: 39.3 % (ref 35.0–45.0)
Hemoglobin: 13 g/dL (ref 11.7–15.5)
MCH: 28.8 pg (ref 27.0–33.0)
MCHC: 33.1 g/dL (ref 32.0–36.0)
MCV: 87.1 fL (ref 80.0–100.0)
MPV: 9.2 fL (ref 7.5–12.5)
Platelets: 489 10*3/uL — ABNORMAL HIGH (ref 140–400)
RBC: 4.51 10*6/uL (ref 3.80–5.10)
RDW: 12.8 % (ref 11.0–15.0)
WBC: 11.8 10*3/uL — ABNORMAL HIGH (ref 3.8–10.8)

## 2018-12-22 LAB — SEDIMENTATION RATE: Sed Rate: 22 mm/h (ref 0–30)

## 2018-12-22 LAB — RHEUMATOID FACTOR: Rheumatoid fact SerPl-aCnc: <14 IU/mL (ref <14)

## 2018-12-22 LAB — ANA: Anti Nuclear Antibody (ANA): NEGATIVE

## 2018-12-22 LAB — URIC ACID: Uric Acid, Serum: 3.9 mg/dL (ref 2.5–7.0)

## 2018-12-22 NOTE — Telephone Encounter (Signed)
Pt called in requesting a call back from someone in regards to her blood work she had done at her last appt with james.    [phone]

## 2018-12-22 NOTE — Telephone Encounter (Signed)
Please see message below.    Please advise.     Thank you.

## 2018-12-25 NOTE — Telephone Encounter (Signed)
Talked with patient and advised her of message below, per Benjiman Core.     Patient stated that she is having a lot of pain in her shoulders and arms and that both of her hands are swollen, with the right hand being worse than the left.  Stated that she is taking the Hydrocodone. Cb# is [phone].  Please advise.  Thank you.

## 2018-12-25 NOTE — Telephone Encounter (Signed)
Please advise patient labs are okay.  Follow-up as scheduled.

## 2018-12-26 ENCOUNTER — Other Ambulatory Visit: Payer: Self-pay

## 2018-12-26 ENCOUNTER — Emergency Department (HOSPITAL_COMMUNITY)
Admission: EM | Admit: 2018-12-26 | Discharge: 2018-12-26 | Discharge status: Against Medical Advice | Payer: Medicare Other | Attending: Emergency Medicine | Admitting: Emergency Medicine

## 2018-12-26 ENCOUNTER — Telehealth: Payer: Self-pay | Admitting: Surgery

## 2018-12-26 DIAGNOSIS — Z5321 Procedure and treatment not carried out due to patient leaving prior to being seen by health care provider: Secondary | ICD-10-CM | POA: Insufficient documentation

## 2018-12-26 DIAGNOSIS — R200 Anesthesia of skin: Secondary | ICD-10-CM | POA: Diagnosis present

## 2018-12-26 LAB — CBC WITH DIFFERENTIAL/PLATELET
Abs Immature Granulocytes: 0.05 10*3/uL (ref 0.00–0.07)
Basophils Absolute: 0.1 10*3/uL (ref 0.0–0.1)
Basophils Relative: 1 %
Eosinophils Absolute: 0.1 10*3/uL (ref 0.0–0.5)
Eosinophils Relative: 1 %
HCT: 37.3 % (ref 36.0–46.0)
Hemoglobin: 12.8 g/dL (ref 12.0–15.0)
Immature Granulocytes: 0 %
Lymphocytes Relative: 18 %
Lymphs Abs: 2.2 10*3/uL (ref 0.7–4.0)
MCH: 30.2 pg (ref 26.0–34.0)
MCHC: 34.3 g/dL (ref 30.0–36.0)
MCV: 88 fL (ref 80.0–100.0)
Monocytes Absolute: 1.1 10*3/uL — ABNORMAL HIGH (ref 0.1–1.0)
Monocytes Relative: 9 %
Neutro Abs: 8.5 10*3/uL — ABNORMAL HIGH (ref 1.7–7.7)
Neutrophils Relative %: 71 %
Platelets: 391 10*3/uL (ref 150–400)
RBC: 4.24 MIL/uL (ref 3.87–5.11)
RDW: 14.2 % (ref 11.5–15.5)
WBC: 12 10*3/uL — ABNORMAL HIGH (ref 4.0–10.5)
nRBC: 0 % (ref 0.0–0.2)

## 2018-12-26 LAB — COMPREHENSIVE METABOLIC PANEL
ALT: 12 U/L (ref 0–44)
AST: 16 U/L (ref 15–41)
Albumin: 3 g/dL — ABNORMAL LOW (ref 3.5–5.0)
Alkaline Phosphatase: 70 U/L (ref 38–126)
Anion gap: 11 (ref 5–15)
BUN: 5 mg/dL — ABNORMAL LOW (ref 8–23)
CO2: 24 mmol/L (ref 22–32)
Calcium: 8.9 mg/dL (ref 8.9–10.3)
Chloride: 98 mmol/L (ref 98–111)
Creatinine, Ser: 0.67 mg/dL (ref 0.44–1.00)
GFR calc Af Amer: >60 mL/min (ref >60)
GFR calc non Af Amer: >60 mL/min (ref >60)
Glucose, Bld: 124 mg/dL — ABNORMAL HIGH (ref 70–99)
Potassium: 3.2 mmol/L — ABNORMAL LOW (ref 3.5–5.1)
Sodium: 133 mmol/L — ABNORMAL LOW (ref 135–145)
Total Bilirubin: 0.4 mg/dL (ref 0.3–1.2)
Total Protein: 7 g/dL (ref 6.5–8.1)

## 2018-12-26 NOTE — ED Notes (Signed)
Per daughter  America Brown , she is taking pt and leaving, states wait is way to long and she will come in by ambulamce tomm

## 2018-12-26 NOTE — Telephone Encounter (Signed)
Patient called. She says she is in a lot of pain. Would like to talk to someone. Her call back number is [phone]

## 2018-12-26 NOTE — ED Triage Notes (Signed)
Pt reports recent neck surgery. Here because of ongoing bilateral arm pain/numbness/tingling. Pt awake, alert, appropriate. Pts main compliant is pain. No neuro deficits noted.

## 2018-12-26 NOTE — Telephone Encounter (Signed)
FYI  I called patient. She states that she is having excruciating pain and cannot even get out of the bed without assistance. She has been taking the hydrocodone, but it is not providing any relief. She states that she cannot stand the pain that she is having in her arms and shoulders. I advised patient if the pain is that debilitating, she should go to the ED to be evaluated, most especially because they are more equipped to help with the pain that she is having than what we would be able to do in the office. I also explained that if the ED felt that she needed emergent imaging based on her exam, they would be able to order and get that done. I told patient that I would send urgent message to Benjiman Core, PA-C to let him know what I have asked her to do.  She understands that he is in surgery assisting Dr. Louanne Skye today.

## 2018-12-27 ENCOUNTER — Emergency Department (HOSPITAL_COMMUNITY): Payer: Medicare Other

## 2018-12-27 ENCOUNTER — Other Ambulatory Visit: Payer: Self-pay

## 2018-12-27 ENCOUNTER — Encounter (HOSPITAL_COMMUNITY): Payer: Self-pay | Admitting: Emergency Medicine

## 2018-12-27 ENCOUNTER — Emergency Department (HOSPITAL_COMMUNITY)
Admission: EM | Admit: 2018-12-27 | Discharge: 2018-12-27 | Disposition: A | Discharge status: Home / Self Care | Payer: Medicare Other | Attending: Emergency Medicine | Admitting: Emergency Medicine

## 2018-12-27 DIAGNOSIS — R52 Pain, unspecified: Secondary | ICD-10-CM | POA: Diagnosis not present

## 2018-12-27 DIAGNOSIS — Z79899 Other long term (current) drug therapy: Secondary | ICD-10-CM | POA: Insufficient documentation

## 2018-12-27 DIAGNOSIS — M5412 Radiculopathy, cervical region: Secondary | ICD-10-CM | POA: Diagnosis not present

## 2018-12-27 DIAGNOSIS — J449 Chronic obstructive pulmonary disease, unspecified: Secondary | ICD-10-CM | POA: Insufficient documentation

## 2018-12-27 DIAGNOSIS — R531 Weakness: Secondary | ICD-10-CM | POA: Diagnosis not present

## 2018-12-27 DIAGNOSIS — Z87891 Personal history of nicotine dependence: Secondary | ICD-10-CM | POA: Diagnosis not present

## 2018-12-27 DIAGNOSIS — M4322 Fusion of spine, cervical region: Secondary | ICD-10-CM | POA: Diagnosis not present

## 2018-12-27 DIAGNOSIS — I10 Essential (primary) hypertension: Secondary | ICD-10-CM | POA: Insufficient documentation

## 2018-12-27 DIAGNOSIS — M79603 Pain in arm, unspecified: Secondary | ICD-10-CM | POA: Diagnosis not present

## 2018-12-27 DIAGNOSIS — M542 Cervicalgia: Secondary | ICD-10-CM | POA: Diagnosis present

## 2018-12-27 LAB — COMPREHENSIVE METABOLIC PANEL
ALT: 12 U/L (ref 0–44)
AST: 16 U/L (ref 15–41)
Albumin: 3.2 g/dL — ABNORMAL LOW (ref 3.5–5.0)
Alkaline Phosphatase: 70 U/L (ref 38–126)
Anion gap: 12 (ref 5–15)
BUN: 8 mg/dL (ref 8–23)
CO2: 24 mmol/L (ref 22–32)
Calcium: 8.8 mg/dL — ABNORMAL LOW (ref 8.9–10.3)
Chloride: 100 mmol/L (ref 98–111)
Creatinine, Ser: 0.51 mg/dL (ref 0.44–1.00)
GFR calc Af Amer: >60 mL/min (ref >60)
GFR calc non Af Amer: >60 mL/min (ref >60)
Glucose, Bld: 121 mg/dL — ABNORMAL HIGH (ref 70–99)
Potassium: 3.5 mmol/L (ref 3.5–5.1)
Sodium: 136 mmol/L (ref 135–145)
Total Bilirubin: 0.2 mg/dL — ABNORMAL LOW (ref 0.3–1.2)
Total Protein: 6.9 g/dL (ref 6.5–8.1)

## 2018-12-27 LAB — CBC WITH DIFFERENTIAL/PLATELET
Abs Immature Granulocytes: 0.08 10*3/uL — ABNORMAL HIGH (ref 0.00–0.07)
Basophils Absolute: 0.1 10*3/uL (ref 0.0–0.1)
Basophils Relative: 1 %
Eosinophils Absolute: 0.1 10*3/uL (ref 0.0–0.5)
Eosinophils Relative: 1 %
HCT: 38.8 % (ref 36.0–46.0)
Hemoglobin: 12.5 g/dL (ref 12.0–15.0)
Immature Granulocytes: 1 %
Lymphocytes Relative: 14 %
Lymphs Abs: 1.9 10*3/uL (ref 0.7–4.0)
MCH: 29 pg (ref 26.0–34.0)
MCHC: 32.2 g/dL (ref 30.0–36.0)
MCV: 90 fL (ref 80.0–100.0)
Monocytes Absolute: 1.2 10*3/uL — ABNORMAL HIGH (ref 0.1–1.0)
Monocytes Relative: 9 %
Neutro Abs: 10.2 10*3/uL — ABNORMAL HIGH (ref 1.7–7.7)
Neutrophils Relative %: 74 %
Platelets: 416 10*3/uL — ABNORMAL HIGH (ref 150–400)
RBC: 4.31 MIL/uL (ref 3.87–5.11)
RDW: 14.3 % (ref 11.5–15.5)
WBC: 13.6 10*3/uL — ABNORMAL HIGH (ref 4.0–10.5)
nRBC: 0 % (ref 0.0–0.2)

## 2018-12-27 MED ORDER — HYDROMORPHONE HCL 1 MG/ML IJ SOLN
0.5000 mg | Freq: Once | INTRAMUSCULAR | Status: AC
  Administered 2018-12-27: 13:00:00 0.5 mg via INTRAVENOUS
  Filled 2018-12-27: qty 1

## 2018-12-27 MED ORDER — HYDROCODONE-ACETAMINOPHEN 5-325 MG PO TABS: 1.0000 | ORAL_TABLET | ORAL | 0 refills | Status: DC | PRN

## 2018-12-27 MED ORDER — GADOBUTROL 1 MMOL/ML IV SOLN
7.0000 mL | Freq: Once | INTRAVENOUS | Status: AC | PRN
  Administered 2018-12-27: 7 mL via INTRAVENOUS

## 2018-12-27 MED ORDER — HYDROMORPHONE HCL 1 MG/ML IJ SOLN
0.5000 mg | Freq: Once | INTRAMUSCULAR | Status: AC
  Administered 2018-12-27: 0.5 mg via INTRAVENOUS
  Filled 2018-12-27: qty 1

## 2018-12-27 MED ORDER — METHYLPREDNISOLONE 4 MG PO TBPK: ORAL_TABLET | ORAL | 0 refills | Status: DC

## 2018-12-27 NOTE — ED Triage Notes (Signed)
Patient presents with neck pain that radiates into bilateral arms post neck surgery on Sept. 15th. EMS administered 75 mcg of Fentanyl for 8/10 pain. Pain medication reduced her pain to 4/10.     EMS vitals: 151/67 BP 88 HR  20 Resp Rate 97.2 Temp 96% O2 sat on room air

## 2018-12-27 NOTE — Discharge Instructions (Signed)
If you develop worsening, recurrent, or continued neck  pain, numbness or weakness in the arms or legs, incontinence of your bowels or bladders, numbness of your buttocks, fever, abdominal pain, or any other new/concerning symptoms then return to the ER for evaluation.  

## 2018-12-27 NOTE — ED Provider Notes (Signed)
[facility] DEPT Provider Note   CSN: [id number] Arrival date & time: 12/27/18  1117     History   Chief Complaint Chief Complaint  Patient presents with  . Neck Pain  . Arm Pain    HPI Heidi Hobbs is a 78 y.o. female.     HPI 78 year old female presents with severe bilateral arm pain and numbness/weakness.  Has been ongoing for about 2 months.  Some neck pain.  Had C5-6 and C6-7 cervical discectomy on 9/14.  She tells me that she feels like her arm symptoms never really improved and have slowly been worsening.  She is been taking hydrocodone.  She was told she cannot take prednisone or have any other type of steroids until about 7 weeks postop (is about 4 weeks now).  She tells me she was given 6 days of steroids just prior to the surgery and this is the best she ever felt.  There is no fever, shortness of breath, chest pain or leg symptoms.  No incontinence.  Past Medical History:  Diagnosis Date  . Adenomatous colon polyp 1995, 2011  . Anemia   . Anxiety   . Cancer [facility])    uterine cancer  . Carotid stenosis    40-59% right, less than 40% left  . Cataracts, bilateral   . COPD (chronic obstructive pulmonary disease) ([place])   . Essential hypertension 02/13/2016  . Hyperlipidemia   . Hypertension   . MAC (mycobacterium avium-intracellulare complex)   . Macular degeneration, dry 02/13/2016  . Polio 1959  . Stomach ulcer 1960  . 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 Vasculitis ([place]) 02/13/2016   Followed by [facility]     Patient Active Problem List   Diagnosis Date Noted  . S/P cervical spinal fusion 12/05/2018  . Spinal stenosis of cervical region 11/22/2018  . Dysuria 04/08/2018  . Cigarette smoker 07/24/2017  . Venous stasis ulcer ([place]) 05/25/2017  . Atherosclerosis of native arteries of extremity with intermittent claudication ([place]) 05/25/2017  . Occlusion and stenosis of vertebral artery 05/14/2016   . Aortic arch atherosclerosis ([place]) 05/14/2016  . Essential hypertension 02/13/2016  . Vasculitis ([place]) 02/13/2016  . Macular degeneration, dry 02/13/2016  . Acute appendicitis with perforation and peritoneal abscess 01/23/2016  . Carotid artery disease ([place]) 07/31/2015  . Bronchiectasis without acute exacerbation ([place]) 08/18/2012  . Cough 08/18/2012  . COPD GOLD 0 08/17/2012    Past Surgical History:  Procedure Laterality Date  . ANTERIOR CERVICAL DECOMP/DISCECTOMY FUSION N/A 11/27/2018   Procedure: CERVICAL FIVE TO SIX , CERVICAL SIX TO SEVEN, ANTERIOR CERVICAL DECOMPRESSION/DISCECTOMY FUSION, ALLOGRAFT, PLATE;  Surgeon: [name], MD;  Location: [facility];  Service: Orthopedics;  Laterality: N/A;  . APPENDECTOMY     rupture appendix  . BREAST ENHANCEMENT SURGERY     removal  . CATARACT EXTRACTION    . COLONOSCOPY    . COLONOSCOPY  10/14/09  . LAPAROSCOPIC APPENDECTOMY N/A 01/23/2016   Procedure: APPENDECTOMY LAPAROSCOPIC;  Surgeon: Erroll Luna, MD;  Location: [place];  Service: General;  Laterality: N/A;  . POLYPECTOMY  2011   x8  . TONSILLECTOMY AND ADENOIDECTOMY    . VAGINAL HYSTERECTOMY  1970   uterine cancer     OB History   No obstetric history on file.      Home Medications    Prior to Admission medications   Medication Sig Start Date End Date Taking? Authorizing Provider  albuterol (PROVENTIL HFA;VENTOLIN HFA)  108 (90 Base) MCG/ACT inhaler Inhale 1 puff into the lungs every 6 (six) hours as needed for wheezing or shortness of breath. Patient taking differently: Inhale 2 puffs into the lungs every 6 (six) hours as needed for wheezing or shortness of breath.  04/07/18  Yes Tanda Rockers, MD  ALPRAZolam Duanne Moron) 0.25 MG tablet Take 0.25 tablets by mouth daily as needed for anxiety.  07/22/17  Yes [provider]  amitriptyline (ELAVIL) 100 MG tablet Take 100 mg by mouth at bedtime.    Yes [provider]  amLODipine (NORVASC) 5 MG tablet Take 5 mg  by mouth daily.    Yes [provider]  atenolol (TENORMIN) 50 MG tablet Take 50 mg by mouth daily.   Yes [provider]  atorvastatin (LIPITOR) 40 MG tablet Take 40 mg by mouth every other day. At night   Yes [provider]  cephALEXin (KEFLEX) 250 MG capsule Take 250 mg by mouth daily.   Yes [provider]  cholecalciferol (VITAMIN D3) 25 MCG (1000 UT) tablet Take 1,000 Units by mouth daily.   Yes [provider]  docusate sodium (COLACE) 100 MG capsule You can buy this at any drug store, over the counter and follow package instructions Patient taking differently: Take 100 mg by mouth daily.  02/01/16  Yes Earnstine Regal, PA-C  ezetimibe (ZETIA) 10 MG tablet Take 10 mg by mouth at bedtime.    Yes [provider]  famotidine (PEPCID) 20 MG tablet Take 20 mg by mouth daily.   Yes [provider]  hydrochlorothiazide (HYDRODIURIL) 25 MG tablet Take 25 mg by mouth daily.   Yes [provider]  HYDROcodone-acetaminophen (NORCO/VICODIN) 5-325 MG tablet Take 1 tablet by mouth every 6 (six) hours as needed for moderate pain. 12/20/18  Yes Lanae Crumbly, PA-C  losartan (COZAAR) 100 MG tablet Take 100 mg by mouth daily.   Yes [provider]  omeprazole (PRILOSEC OTC) 20 MG tablet Take 20 mg by mouth at bedtime.   Yes [provider]  Probiotic CAPS Take 1 capsule by mouth daily.   Yes [provider]  vitamin B-12 (CYANOCOBALAMIN) 500 MCG tablet Take 500 mcg by mouth daily.   Yes [provider]  diclofenac sodium (VOLTAREN) 1 % GEL Apply 4 g topically 4 (four) times daily. Patient not taking: Reported on 12/27/2018 12/06/18   [name], MD  dicyclomine (BENTYL) 10 MG capsule Take 1 capsule (10 mg total) by mouth 3 (three) times daily before meals. Patient not taking: Reported on 12/27/2018 07/26/18   Ladene Artist, MD  EPINEPHrine 0.3 mg/0.3 mL IJ SOAJ injection Inject 0.3 mLs into the  muscle once as needed for anaphylaxis.     [provider]  HYDROcodone-acetaminophen (NORCO) 5-325 MG tablet Take 1-2 tablets by mouth every 6 (six) hours as needed for moderate pain. Patient not taking: Reported on 12/27/2018 11/28/18   [name], MD  HYDROcodone-acetaminophen [facility]) 5-325 MG tablet Take 1 tablet by mouth every 4 (four) hours as needed for severe pain. 12/27/18   Sherwood Gambler, MD  methylPREDNISolone (MEDROL DOSEPAK) 4 MG TBPK tablet Take per package instructions for Medrol Dose pack 12/27/18   Sherwood Gambler, MD  saccharomyces boulardii (FLORASTOR) 250 MG capsule You can buy this over the counter at any drugstore and use for the next month. Patient not taking: Reported on 12/27/2018 02/01/16   Earnstine Regal, PA-C    Family History Family History  Problem  Relation Age of Onset  . Colon cancer Father   . Esophageal cancer Brother   . Esophageal cancer Brother   . Asthma Daughter        as a child  . Stomach cancer Neg Hx   . Rectal cancer Neg Hx     Social History Social History   Tobacco Use  . Smoking status: Former Smoker    Packs/day: 0.50    Years: 50.00    Pack years: 25.00    Types: Cigarettes    Quit date: 08/01/2017    Years since quitting: 1.4  . Smokeless tobacco: Never Used  Substance Use Topics  . Alcohol use: No    Alcohol/week: 0.0 standard drinks  . Drug use: No     Allergies   Eggs or egg-derived products   Review of Systems Review of Systems  Constitutional: Negative for fever.  Respiratory: Negative for shortness of breath.   Cardiovascular: Negative for chest pain.  Musculoskeletal: Negative for neck pain.  Neurological: Positive for weakness and numbness.  All other systems reviewed and are negative.    Physical Exam Updated Vital Signs BP (!) 156/62   Pulse 69   Temp 97.7 F (36.5 C) (Oral)   Resp (!) 9   Ht _0  (1.727 m)   Wt 68 kg   SpO2 93%   BMI 22.81 kg/m   Physical Exam Vitals signs  and nursing note reviewed.  Constitutional:      General: She is not in acute distress.    Appearance: She is well-developed. She is not ill-appearing or diaphoretic.  HENT:     Head: Normocephalic and atraumatic.     Right Ear: External ear normal.     Left Ear: External ear normal.     Nose: Nose normal.  Eyes:     General:        Right eye: No discharge.        Left eye: No discharge.  Neck:     Musculoskeletal: No muscular tenderness.     Comments: In soft cervical collar Well healing cervical surgical scar Cardiovascular:     Rate and Rhythm: Normal rate and regular rhythm.     Pulses:          Radial pulses are 2+ on the right side and 2+ on the left side.     Heart sounds: Normal heart sounds.  Pulmonary:     Effort: Pulmonary effort is normal.     Breath sounds: Normal breath sounds.  Abdominal:     Palpations: Abdomen is soft.     Tenderness: There is no abdominal tenderness.  Skin:    General: Skin is warm and dry.  Neurological:     Mental Status: She is alert.     Comments: CN 3-12 grossly intact. 4/5 strength in bilateral upper extremities, limited due to pain it causes. 5/5 strength in bilateral lower extremities.  Psychiatric:        Mood and Affect: Mood is not anxious.      ED Treatments / Results  Labs (all labs ordered are listed, but only abnormal results are displayed) Labs Reviewed  COMPREHENSIVE METABOLIC PANEL - Abnormal; Notable for the following components:      Result Value   Glucose, Bld 121 (*)    Calcium 8.8 (*)    Albumin 3.2 (*)    Total Bilirubin 0.2 (*)    All other components within normal limits  CBC WITH DIFFERENTIAL/PLATELET - Abnormal; Notable  for the following components:   WBC 13.6 (*)    Platelets 416 (*)    Neutro Abs 10.2 (*)    Monocytes Absolute 1.2 (*)    Abs Immature Granulocytes 0.08 (*)    All other components within normal limits    EKG EKG Interpretation  Date/Time:  Wednesday December 27 2018 11:38:14  EDT Ventricular Rate:  77 PR Interval:    QRS Duration: 80 QT Interval:  396 QTC Calculation: 449 R Axis:   69 Text Interpretation:  Sinus rhythm nonspecific ST changes similar to Sept 2020 Confirmed by Sherwood Gambler [phone]) on 12/27/2018 12:54:45 PM   Radiology Mr Cervical Spine W Or Wo Contrast  Result Date: 12/27/2018 CLINICAL DATA:  Neck pain and bilateral arm pain. Postoperative weakness in both arms. EXAM: MRI CERVICAL SPINE WITHOUT AND WITH CONTRAST TECHNIQUE: Multiplanar and multiecho pulse sequences of the cervical spine, to include the craniocervical junction and cervicothoracic junction, were obtained without and with intravenous contrast. CONTRAST:  68m GADAVIST GADOBUTROL 1 MMOL/ML IV SOLN COMPARISON:  Radiographs dated 12/05/2018 and MRI dated 11/15/2018 FINDINGS: Alignment: Normal. Vertebrae: Interval anterior cervical fusion at C5-6 and C6-7. Slight enhancement of those vertebral bodies after contrast administration to the expected degree. No other significant abnormalities. Cord: No mass lesion or myelopathy or spinal cord compression. No epidural hematoma. No pathologic enhancement of the spinal cord after contrast administration. Posterior Fossa, vertebral arteries, paraspinal tissues: Negative. Disc levels: C2-3: Normal disc. Slight bilateral facet arthritis, left greater than right. C3-4: Normal disc. Moderate left and mild right facet arthritis. C4-5: Small disc osteophyte complex asymmetric to the left without neural impingement. C5-6 and C6-7: Anterior cervical fusion. No residual impingement. Slight enhancement of the vertebral bodies consistent with recent surgery. C7-T1: Negative. IMPRESSION: 1. Interval anterior cervical fusion at C5-6 and C6-7 with no residual impingement. 2. Moderate left facet arthritis at C3-4. 3. No other significant abnormalities. Specifically, no evidence of hematoma in the spinal canal or new cervical spine spinal cord impingement. The impingement  at C5-6 noted on the prior study has been relieved. Electronically Signed   By: JLorriane ShireM.D.   On: 12/27/2018 15:20    Procedures Procedures (including critical care time)  Medications Ordered in ED Medications  HYDROmorphone (DILAUDID) injection 0.5 mg (0.5 mg Intravenous Given 12/27/18 1238)  gadobutrol (GADAVIST) 1 MMOL/ML injection 7 mL (7 mLs Intravenous Contrast Given 12/27/18 1436)  HYDROmorphone (DILAUDID) injection 0.5 mg (0.5 mg Intravenous Given 12/27/18 1549)     Initial Impression / Assessment and Plan / ED Course  I have reviewed the triage vital signs and the nursing notes.  Pertinent labs & imaging results that were available during my care of the patient were reviewed by me and considered in my medical decision making (see chart for details).        MRI obtained to help rule out postop complication such as hematoma or infection.  This is negative.  Labs show continued mild WBC elevation but no other acute abnormalities.  I discussed with Dr. WDurward Fortesof OLorne Skeens he recommends Medrol Dosepak.  At this point, it has been more than 4 weeks since her surgery and given risk/benefit of different medications versus patient comfort, recommends that steroids should be okay.  Dr. YLorin Mercyis unable to see patient so he recommends her seeing Dr. NLouanne Skyeearly next week for spine specialist referral and work-up as an outpatient.  Otherwise, she does not appear to have significant weakness in her arms but a lot of  limitation due to pain.  I still think this probably nerve related given the numbness and radicular type pain.  It would be unlikely to be bilateral shoulder/arm issues.  She states she is basically almost out of the hydrocodone so I will give her a brief amount for discharge.  Discharged home with return precautions.  Final Clinical Impressions(s) / ED Diagnoses   Final diagnoses:  Cervical radiculopathy    ED Discharge Orders         Ordered     methylPREDNISolone (MEDROL DOSEPAK) 4 MG TBPK tablet     12/27/18 1619    HYDROcodone-acetaminophen (NORCO) 5-325 MG tablet  Every 4 hours PRN     12/27/18 1619           Sherwood Gambler, MD 12/27/18 1649

## 2019-01-02 ENCOUNTER — Other Ambulatory Visit: Payer: Self-pay

## 2019-01-02 ENCOUNTER — Ambulatory Visit: Payer: Self-pay

## 2019-01-02 ENCOUNTER — Encounter: Payer: Self-pay | Admitting: Specialist

## 2019-01-02 ENCOUNTER — Ambulatory Visit (INDEPENDENT_AMBULATORY_CARE_PROVIDER_SITE_OTHER): Payer: Medicare Other | Admitting: Specialist

## 2019-01-02 VITALS — BP 145/78 | HR 92 | Ht 68.0 in | Wt 150.0 lb

## 2019-01-02 DIAGNOSIS — M25511 Pain in right shoulder: Secondary | ICD-10-CM | POA: Diagnosis not present

## 2019-01-02 DIAGNOSIS — M778 Other enthesopathies, not elsewhere classified: Secondary | ICD-10-CM | POA: Diagnosis not present

## 2019-01-02 DIAGNOSIS — L958 Other vasculitis limited to the skin: Secondary | ICD-10-CM

## 2019-01-02 DIAGNOSIS — M318 Other specified necrotizing vasculopathies: Secondary | ICD-10-CM

## 2019-01-02 DIAGNOSIS — Z981 Arthrodesis status: Secondary | ICD-10-CM | POA: Diagnosis not present

## 2019-01-02 DIAGNOSIS — M7551 Bursitis of right shoulder: Secondary | ICD-10-CM | POA: Diagnosis not present

## 2019-01-02 DIAGNOSIS — M7581 Other shoulder lesions, right shoulder: Secondary | ICD-10-CM

## 2019-01-02 DIAGNOSIS — M7552 Bursitis of left shoulder: Secondary | ICD-10-CM

## 2019-01-02 DIAGNOSIS — R202 Paresthesia of skin: Secondary | ICD-10-CM

## 2019-01-02 DIAGNOSIS — R200 Anesthesia of skin: Secondary | ICD-10-CM

## 2019-01-02 DIAGNOSIS — M25512 Pain in left shoulder: Secondary | ICD-10-CM

## 2019-01-02 MED ORDER — BUPIVACAINE HCL 0.25 % IJ SOLN
4.0000 mL | INTRAMUSCULAR | Status: AC | PRN
  Administered 2019-01-02: 4 mL via INTRA_ARTICULAR

## 2019-01-02 MED ORDER — METHYLPREDNISOLONE ACETATE 40 MG/ML IJ SUSP
40.0000 mg | INTRAMUSCULAR | Status: AC | PRN
  Administered 2019-01-02: 40 mg via INTRA_ARTICULAR

## 2019-01-02 NOTE — Patient Instructions (Signed)
Avoid overhead lifting and overhead use of the arms. Do not lift greater than 5 lbs. Adjust head rest in vehicle to prevent hyperextension if rear ended. Take extra precautions to avoid falling, including use of a cane if you feel weak. EMG/NCVs testing of nerves right and left arm by neurology ordered. Physical therapy ordered for shoulders.  Rheumatology evaluation for possible seronegative rheumatologic condition as steroid seems to provide the  Most relief of your shoulder and arm pain and history of urticarial vasculitis

## 2019-01-02 NOTE — Progress Notes (Signed)
Office Visit Note   Patient: Heidi Hobbs           Date of Birth: 03-11-1941           MRN: [id number] Visit Date: 01/02/2019              Requested by: Shon Baton, [address],  [place] [address] PCP: Shon Baton, MD   Assessment & Plan: Visit Diagnoses:  1. S/P cervical spinal fusion   2. Bilateral shoulder pain, unspecified chronicity   3. Bilateral shoulder bursitis   4. Right shoulder tendonitis   5. Urticarial vasculitis (HCC)     Plan:Avoid overhead lifting and overhead use of the arms. Do not lift greater than 5 lbs. Adjust head rest in vehicle to prevent hyperextension if rear ended. Take extra precautions to avoid falling, including use of a cane if you feel weak. EMG/NCVs testing of nerves right and left arm by neurology ordered. Physical therapy ordered for shoulders.  Rheumatology evaluation for possible seronegative rheumatologic condition as steroid seems to provide the  Most relief of your shoulder and arm pain and history of urticarial vasculitis  Follow-Up Instructions: No follow-ups on file.   Orders:  Orders Placed This Encounter  Procedures  . XR Cervical Spine 2 or 3 views   No orders of the defined types were placed in this encounter.     Procedures: Large Joint Inj: bilateral subacromial bursa on 01/02/2019 12:21 PM Indications: pain Details: 25 G 1.5 in needle, posterior approach  Arthrogram: No  Medications (Right): 4 mL bupivacaine 0.25 %; 40 mg methylPREDNISolone acetate 40 MG/ML Medications (Left): 4 mL bupivacaine 0.25 %; 40 mg methylPREDNISolone acetate 40 MG/ML Outcome: tolerated well, no immediate complications  Bandaid applied Procedure, treatment alternatives, risks and benefits explained, specific risks discussed. Consent was given by the patient. Immediately prior to procedure a time out was called to verify the correct patient, procedure, equipment, support staff and site/side marked as required. Patient was  prepped and draped in the usual sterile fashion.       Clinical Data: No additional findings.   Subjective: Chief Complaint  Patient presents with  . Neck - Routine Post Op  . Left Arm - Pain, Numbness  . Right Arm - Pain, Numbness    78 year old female right handed 5 weeks post ACDF by Dr. Lorin Mercy C5-6 and C6-7. Reports that her neck does not hurt but her shoulders are killing her. Night pain and difficulty using the arms. I almost died of an appendicitis but this is worse. She has seen Benjiman Core and Dr. Lorin Mercy post op and was seen in the ER recently and MRI done with no significant post op abnormalities, no compressive spondylosis at C4-5 above the fusion. She is now on prednison with improved pain.     Review of Systems  Constitutional: Negative.   HENT: Negative.   Eyes: Negative.   Respiratory: Negative.   Cardiovascular: Negative.   Gastrointestinal: Negative.   Endocrine: Negative.   Genitourinary: Negative.   Musculoskeletal: Negative.   Skin: Negative.   Allergic/Immunologic: Negative.   Neurological: Negative.   Hematological: Negative.   Psychiatric/Behavioral: Negative.      Objective: Vital Signs: BP (!) 145/78 (BP Location: Left Arm, Patient Position: Sitting)   Pulse 92   Ht _0  (1.727 m)   Wt 150 lb (68 kg)   BMI 22.81 kg/m   Physical Exam Constitutional:      Appearance: She is well-developed.  HENT:     Head: Normocephalic and atraumatic.  Eyes:     Pupils: Pupils are equal, round, and reactive to light.  Neck:     Musculoskeletal: Normal range of motion and neck supple.  Pulmonary:     Effort: Pulmonary effort is normal.     Breath sounds: Normal breath sounds.  Abdominal:     General: Bowel sounds are normal.     Palpations: Abdomen is soft.  Skin:    General: Skin is warm and dry.  Neurological:     Mental Status: She is alert and oriented to person, place, and time.  Psychiatric:        Behavior: Behavior normal.         Thought Content: Thought content normal.        Judgment: Judgment normal.     Right Shoulder Exam   Tenderness  The patient is experiencing tenderness in the acromion and biceps tendon.  Range of Motion  Active abduction: abnormal  Passive abduction: abnormal  Extension: normal  External rotation: abnormal  Forward flexion: abnormal  Internal rotation 0 degrees: abnormal   Muscle Strength  Abduction: 4/5  Internal rotation: 4/5  External rotation: 4/5  Supraspinatus: 4/5  Subscapularis: 5/5  Biceps: 5/5   Tests  Apprehension: positive Impingement: positive  Other  Erythema: absent Scars: absent Sensation: normal Pulse: present   Left Shoulder Exam   Tenderness  The patient is experiencing tenderness in the acromion.  Range of Motion  Active abduction: abnormal  Passive abduction: abnormal  External rotation: abnormal  Forward flexion: abnormal  Internal rotation 0 degrees: abnormal   Muscle Strength  Abduction: 4/5  Internal rotation: 5/5  External rotation: 5/5  Supraspinatus: 5/5  Subscapularis: 5/5  Biceps: 5/5   Tests  Apprehension: positive Impingement: positive  Other  Erythema: absent Scars: absent Sensation: normal Pulse: present       Specialty Comments:  No specialty comments available.  Imaging: Xr Cervical Spine 2 Or 3 Views  Result Date: 01/02/2019 AP and lateral radiographs of the cervical spine with plate and screws and bone plugs at the C5-6 and C6-7 levels in good position and aligment. No significant soft tissue swelling anterior to the cervical spine surgical levels.     PMFS History: Patient Active Problem List   Diagnosis Date Noted  . S/P cervical spinal fusion 12/05/2018  . Spinal stenosis of cervical region 11/22/2018  . Dysuria 04/08/2018  . Cigarette smoker 07/24/2017  . Venous stasis ulcer ([place]) 05/25/2017  . Atherosclerosis of native arteries of extremity with intermittent claudication ([place])  05/25/2017  . Occlusion and stenosis of vertebral artery 05/14/2016  . Aortic arch atherosclerosis ([place]) 05/14/2016  . Essential hypertension 02/13/2016  . Vasculitis ([place]) 02/13/2016  . Macular degeneration, dry 02/13/2016  . Acute appendicitis with perforation and peritoneal abscess 01/23/2016  . Carotid artery disease ([place]) 07/31/2015  . Bronchiectasis without acute exacerbation ([place]) 08/18/2012  . Cough 08/18/2012  . COPD GOLD 0 08/17/2012   Past Medical History:  Diagnosis Date  . Adenomatous colon polyp 1995, 2011  . Anemia   . Anxiety   . Cancer [facility])    uterine cancer  . Carotid stenosis    40-59% right, less than 40% left  . Cataracts, bilateral   . COPD (chronic obstructive pulmonary disease) ([place])   . Essential hypertension 02/13/2016  . Hyperlipidemia   . Hypertension   . MAC (mycobacterium avium-intracellulare complex)   . Macular degeneration, dry 02/13/2016  . Polio  [place]  . 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 Vasculitis ([place]) 02/13/2016   Followed by [facility]     Family History  Problem Relation Age of Onset  . Colon cancer Father   . Esophageal cancer Brother   . Esophageal cancer Brother   . Asthma Daughter        as a child  . Stomach cancer Neg Hx   . Rectal cancer Neg Hx     Past Surgical History:  Procedure Laterality Date  . ANTERIOR CERVICAL DECOMP/DISCECTOMY FUSION N/A 11/27/2018   Procedure: CERVICAL FIVE TO SIX , CERVICAL SIX TO SEVEN, ANTERIOR CERVICAL DECOMPRESSION/DISCECTOMY FUSION, ALLOGRAFT, PLATE;  Surgeon: [name], MD;  Location: [place];  Service: Orthopedics;  Laterality: N/A;  . APPENDECTOMY     rupture appendix  . BREAST ENHANCEMENT SURGERY     removal  . CATARACT EXTRACTION    . COLONOSCOPY    . COLONOSCOPY  10/14/09  . LAPAROSCOPIC APPENDECTOMY N/A 01/23/2016   Procedure: APPENDECTOMY LAPAROSCOPIC;  Surgeon: Erroll Luna, MD;  Location: [place];  Service: General;   Laterality: N/A;  . POLYPECTOMY  2011   x8  . TONSILLECTOMY AND ADENOIDECTOMY    . VAGINAL HYSTERECTOMY  1970   uterine cancer   Social History   Occupational History  . 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 Smoking status: Former Smoker    Packs/day: 0.50    Years: 50.00    Pack years: 25.00    Types: Cigarettes    Quit date: 08/01/2017    Years since quitting: 1.4  . Smokeless tobacco: Never Used  Substance and Sexual Activity  . Alcohol use: No    Alcohol/week: 0.0 standard drinks  . Drug use: No  . Sexual activity: Not on file

## 2019-01-11 DIAGNOSIS — H353221 Exudative age-related macular degeneration, left eye, with active choroidal neovascularization: Secondary | ICD-10-CM | POA: Diagnosis not present

## 2019-01-16 ENCOUNTER — Other Ambulatory Visit: Payer: Self-pay

## 2019-01-16 ENCOUNTER — Ambulatory Visit (INDEPENDENT_AMBULATORY_CARE_PROVIDER_SITE_OTHER): Payer: Medicare Other | Admitting: Orthopaedic Surgery

## 2019-01-16 ENCOUNTER — Encounter: Payer: Self-pay | Admitting: Orthopaedic Surgery

## 2019-01-16 ENCOUNTER — Ambulatory Visit (INDEPENDENT_AMBULATORY_CARE_PROVIDER_SITE_OTHER): Payer: Medicare Other

## 2019-01-16 VITALS — BP 138/75 | HR 78 | Ht 68.0 in | Wt 147.5 lb

## 2019-01-16 DIAGNOSIS — Z981 Arthrodesis status: Secondary | ICD-10-CM | POA: Diagnosis not present

## 2019-01-16 NOTE — Progress Notes (Signed)
Post-Op Visit Note   Patient: Heidi Hobbs           Date of Birth: 24-Sep-1940           MRN: [id number] Visit Date: 01/16/2019 PCP: Shon Baton, MD   Assessment & Plan:  Chief Complaint:  Chief Complaint  Patient presents with  . Neck - Follow-up    11/27/2018 C5-6, C6-7 ACDF, Allograft, Plate   Visit Diagnoses:  1. S/P cervical spinal fusion     Plan: Patient returns post two-level cervical fusion at 7 weeks.  She states her neck feels good.  She continues to have pain with attempted abduction of her shoulder.  She has had some weakness in her legs from her cervical stenosis and have been using her arms to push herself up.  She developed significant pain in one shoulder followed by the other.  Rheumatologic labs were negative.  She had a MRI obtained which showed 2 level fusion mild facet arthritis on the left at C3-4 and satisfactory decompression at both levels where she had moderate stenosis C6-7 and severe at C5-6.  She has an appointment coming up with a neurologist for electrical tests to rule out carpal tunnel syndrome.  I plan to check her back again in 6 weeks.  She can discontinue the soft collar.  Follow-Up Instructions: No follow-ups on file.   Orders:  Orders Placed This Encounter  Procedures  . XR Cervical Spine 2 or 3 views   No orders of the defined types were placed in this encounter.   Imaging: No results found.  PMFS History: Patient Active Problem List   Diagnosis Date Noted  . S/P cervical spinal fusion 12/05/2018  . Spinal stenosis of cervical region 11/22/2018  . Dysuria 04/08/2018  . Cigarette smoker 07/24/2017  . Venous stasis ulcer ([place]) 05/25/2017  . Atherosclerosis of native arteries of extremity with intermittent claudication ([place]) 05/25/2017  . Occlusion and stenosis of vertebral artery 05/14/2016  . Aortic arch atherosclerosis ([place]) 05/14/2016  . Essential hypertension 02/13/2016  . Vasculitis ([place]) 02/13/2016  . Macular  degeneration, dry 02/13/2016  . Acute appendicitis with perforation and peritoneal abscess 01/23/2016  . Carotid artery disease ([place]) 07/31/2015  . Bronchiectasis without acute exacerbation ([place]) 08/18/2012  . Cough 08/18/2012  . COPD GOLD 0 08/17/2012   Past Medical History:  Diagnosis Date  . Adenomatous colon polyp 1995, 2011  . Anemia   . Anxiety   . Cancer [facility])    uterine cancer  . Carotid stenosis    40-59% right, less than 40% left  . Cataracts, bilateral   . COPD (chronic obstructive pulmonary disease) ([place])   . Essential hypertension 02/13/2016  . Hyperlipidemia   . Hypertension   . MAC (mycobacterium avium-intracellulare complex)   . Macular degeneration, dry 02/13/2016  . Polio 1959  . Stomach ulcer 1960  . Vasculitis ([place])    Normocomplementemic urticarial   . Vasculitis ([place]) 02/13/2016   Followed by [facility]     Family History  Problem Relation Age of Onset  . Colon cancer Father   . Esophageal cancer Brother   . Esophageal cancer Brother   . Asthma Daughter        as a child  . Stomach cancer Neg Hx   . Rectal cancer Neg Hx     Past Surgical History:  Procedure Laterality Date  . ANTERIOR CERVICAL DECOMP/DISCECTOMY FUSION N/A 11/27/2018   Procedure: CERVICAL FIVE TO SIX , CERVICAL SIX TO SEVEN,  ANTERIOR CERVICAL DECOMPRESSION/DISCECTOMY FUSION, ALLOGRAFT, PLATE;  Surgeon: [name], MD;  Location: [place];  Service: Orthopedics;  Laterality: N/A;  . APPENDECTOMY     rupture appendix  . BREAST ENHANCEMENT SURGERY     removal  . CATARACT EXTRACTION    . COLONOSCOPY    . COLONOSCOPY  10/14/09  . LAPAROSCOPIC APPENDECTOMY N/A 01/23/2016   Procedure: APPENDECTOMY LAPAROSCOPIC;  Surgeon: Erroll Luna, MD;  Location: [place];  Service: General;  Laterality: N/A;  . POLYPECTOMY  2011   x8  . TONSILLECTOMY AND ADENOIDECTOMY    . VAGINAL HYSTERECTOMY  1970   uterine cancer   Social History   Occupational History  . Occupation: Works PT as  a [employer]  . Smoking status: Former Smoker    Packs/day: 0.50    Years: 50.00    Pack years: 25.00    Types: Cigarettes    Quit date: 08/01/2017    Years since quitting: 1.4  . Smokeless tobacco: Never Used  Substance and Sexual Activity  . Alcohol use: No    Alcohol/week: 0.0 standard drinks  . Drug use: No  . Sexual activity: Not on file

## 2019-02-01 NOTE — Progress Notes (Deleted)
Office Visit Note  Patient: Heidi Hobbs             Date of Birth: [birth year]           MRN: [id number]             PCP: Shon Baton, MD Referring: Jessy Oto, MD Visit Date: 02/15/2019 Occupation: _0 @  Subjective:  No chief complaint on file.   History of Present Illness: Heidi Hobbs is a 78 y.o. female ***   Activities of Daily Living:  Patient reports morning stiffness for *** {minute/hour:19697}.   Patient {ACTIONS;DENIES/REPORTS:21021675::"Denies"} nocturnal pain.  Difficulty dressing/grooming: {ACTIONS;DENIES/REPORTS:21021675::"Denies"} Difficulty climbing stairs: {ACTIONS;DENIES/REPORTS:21021675::"Denies"} Difficulty getting out of chair: {ACTIONS;DENIES/REPORTS:21021675::"Denies"} Difficulty using hands for taps, buttons, cutlery, and/or writing: {ACTIONS;DENIES/REPORTS:21021675::"Denies"}  No Rheumatology ROS completed.   PMFS History:  Patient Active Problem List   Diagnosis Date Noted  . S/P cervical spinal fusion 12/05/2018  . Spinal stenosis of cervical region 11/22/2018  . Dysuria 04/08/2018  . Cigarette smoker 07/24/2017  . Venous stasis ulcer ([place]) 05/25/2017  . Atherosclerosis of native arteries of extremity with intermittent claudication ([place]) 05/25/2017  . Occlusion and stenosis of vertebral artery 05/14/2016  . Aortic arch atherosclerosis ([place]) 05/14/2016  . Essential hypertension 02/13/2016  . Vasculitis ([place]) 02/13/2016  . Macular degeneration, dry 02/13/2016  . Acute appendicitis with perforation and peritoneal abscess 01/23/2016  . Carotid artery disease ([place]) 07/31/2015  . Bronchiectasis without acute exacerbation ([place]) 08/18/2012  . Cough 08/18/2012  . COPD GOLD 0 08/17/2012    Past Medical History:  Diagnosis Date  . Adenomatous colon polyp 1995, 2011  . Anemia   . Anxiety   . Cancer [facility])    uterine cancer  . Carotid stenosis    40-59% right, less than 40% left  . Cataracts, bilateral   . COPD (chronic obstructive  pulmonary disease) ([place])   . Essential hypertension 02/13/2016  . Hyperlipidemia   . Hypertension   . MAC (mycobacterium avium-intracellulare complex)   . Macular degeneration, dry 02/13/2016  . Polio 1959  . Stomach ulcer 1960  . Vasculitis ([place])    Normocomplementemic urticarial   . Vasculitis ([place]) 02/13/2016   Followed by [facility]     Family History  Problem Relation Age of Onset  . Colon cancer Father   . Esophageal cancer Brother   . Esophageal cancer Brother   . Asthma Daughter        as a child  . Stomach cancer Neg Hx   . Rectal cancer Neg Hx    Past Surgical History:  Procedure Laterality Date  . ANTERIOR CERVICAL DECOMP/DISCECTOMY FUSION N/A 11/27/2018   Procedure: CERVICAL FIVE TO SIX , CERVICAL SIX TO SEVEN, ANTERIOR CERVICAL DECOMPRESSION/DISCECTOMY FUSION, ALLOGRAFT, PLATE;  Surgeon: [name], MD;  Location: [facility];  Service: Orthopedics;  Laterality: N/A;  . APPENDECTOMY     rupture appendix  . BREAST ENHANCEMENT SURGERY     removal  . CATARACT EXTRACTION    . COLONOSCOPY    . COLONOSCOPY  10/14/09  . LAPAROSCOPIC APPENDECTOMY N/A 01/23/2016   Procedure: APPENDECTOMY LAPAROSCOPIC;  Surgeon: Erroll Luna, MD;  Location: [facility];  Service: General;  Laterality: N/A;  . POLYPECTOMY  2011   x8  . TONSILLECTOMY AND ADENOIDECTOMY    . VAGINAL HYSTERECTOMY  1970   uterine cancer   Social History   Social History Narrative  . Not on file   Immunization History  Administered Date(s) Administered  . Influenza Whole 12/14/2011,  12/13/2016  . Influenza, High Dose Seasonal PF 11/21/2017  . Tdap 10/16/2011     Objective: Vital Signs: There were no vitals taken for this visit.   Physical Exam   Musculoskeletal Exam: ***  CDAI Exam: CDAI Score: - Patient Global: -; Provider Global: - Swollen: -; Tender: - Joint Exam   No joint exam has been documented for this visit   There is currently no information documented on the homunculus. Go to  the Rheumatology activity and complete the homunculus joint exam.  Investigation: Findings:  12/20/18: ANA-, RF<14, ESR 22, uric acid 3.9   Component     Latest Ref Rng & Units 12/20/2018  Uric Acid, Serum     2.5 - 7.0 mg/dL 3.9  RA Latex Turbid.     <14 IU/mL <14  Anti Nuclear Antibody (ANA)     NEGATIVE NEGATIVE  Sed Rate     0 - 30 mm/h 22   Imaging: Xr Cervical Spine 2 Or 3 Views  Result Date: 01/16/2019 Lateral flexion-extension x-ray AP x-ray demonstrates two-level cervical fusion C5-6 C6-7 without motion.  Graft incorporation partially is noted no evidence of screw loosening. Impression: 7 weeks post cervical fusion without evidence of motion on flexion-extension x-rays.  Xr Cervical Spine 2 Or 3 Views  Result Date: 01/02/2019 AP and lateral radiographs of the cervical spine with plate and screws and bone plugs at the C5-6 and C6-7 levels in good position and aligment. No significant soft tissue swelling anterior to the cervical spine surgical levels.    Recent Labs: Lab Results  Component Value Date   WBC 13.6 (H) 12/27/2018   HGB 12.5 12/27/2018   PLT 416 (H) 12/27/2018   NA 136 12/27/2018   K 3.5 12/27/2018   CL 100 12/27/2018   CO2 24 12/27/2018   GLUCOSE 121 (H) 12/27/2018   BUN 8 12/27/2018   CREATININE 0.51 12/27/2018   BILITOT 0.2 (L) 12/27/2018   ALKPHOS 70 12/27/2018   AST 16 12/27/2018   ALT 12 12/27/2018   PROT 6.9 12/27/2018   ALBUMIN 3.2 (L) 12/27/2018   CALCIUM 8.8 (L) 12/27/2018   GFRAA >60 12/27/2018    Speciality Comments: No specialty comments available.  Procedures:  No procedures performed Allergies: Eggs or egg-derived products   Assessment / Plan:     Visit Diagnoses: No diagnosis found.  Orders: No orders of the defined types were placed in this encounter.  No orders of the defined types were placed in this encounter.   Face-to-face time spent with patient was *** minutes. Greater than 50% of time was spent in counseling  and coordination of care.  Follow-Up Instructions: No follow-ups on file.   Ofilia Neas, PA-C  Note - This record has been created using Dragon software.  Chart creation errors have been sought, but may not always  have been located. Such creation errors do not reflect on  the standard of medical care.

## 2019-02-07 ENCOUNTER — Telehealth: Payer: Self-pay | Admitting: Orthopaedic Surgery

## 2019-02-07 NOTE — Telephone Encounter (Signed)
Patient request something for pain? °

## 2019-02-07 NOTE — Telephone Encounter (Signed)
Can you advise? 

## 2019-02-12 ENCOUNTER — Other Ambulatory Visit: Payer: Self-pay | Admitting: Surgical

## 2019-02-12 MED ORDER — HYDROCODONE-ACETAMINOPHEN 5-325 MG PO TABS: 1.0000 | ORAL_TABLET | Freq: Three times a day (TID) | ORAL | 0 refills | Status: DC | PRN

## 2019-02-15 ENCOUNTER — Ambulatory Visit: Payer: Medicare Other | Admitting: Rheumatology

## 2019-02-20 ENCOUNTER — Telehealth: Payer: Self-pay | Admitting: Neurology

## 2019-02-20 ENCOUNTER — Encounter: Payer: Self-pay | Admitting: Neurology

## 2019-02-20 ENCOUNTER — Encounter: Payer: Medicare Other | Admitting: Neurology

## 2019-02-20 NOTE — Telephone Encounter (Signed)
This patient canceled the same day of an EMG and nerve conduction study evaluation today.

## 2019-02-22 DIAGNOSIS — H353231 Exudative age-related macular degeneration, bilateral, with active choroidal neovascularization: Secondary | ICD-10-CM | POA: Diagnosis not present

## 2019-02-22 DIAGNOSIS — Z961 Presence of intraocular lens: Secondary | ICD-10-CM | POA: Diagnosis not present

## 2019-02-25 ENCOUNTER — Encounter (HOSPITAL_BASED_OUTPATIENT_CLINIC_OR_DEPARTMENT_OTHER): Payer: Self-pay | Admitting: Emergency Medicine

## 2019-02-25 ENCOUNTER — Emergency Department (HOSPITAL_BASED_OUTPATIENT_CLINIC_OR_DEPARTMENT_OTHER)
Admission: EM | Admit: 2019-02-25 | Discharge: 2019-02-25 | Disposition: A | Discharge status: Home / Self Care | Payer: Medicare Other | Attending: Emergency Medicine | Admitting: Emergency Medicine

## 2019-02-25 ENCOUNTER — Other Ambulatory Visit: Payer: Self-pay

## 2019-02-25 DIAGNOSIS — Z87891 Personal history of nicotine dependence: Secondary | ICD-10-CM | POA: Diagnosis not present

## 2019-02-25 DIAGNOSIS — I10 Essential (primary) hypertension: Secondary | ICD-10-CM | POA: Insufficient documentation

## 2019-02-25 DIAGNOSIS — Z79899 Other long term (current) drug therapy: Secondary | ICD-10-CM | POA: Diagnosis not present

## 2019-02-25 DIAGNOSIS — R300 Dysuria: Secondary | ICD-10-CM | POA: Diagnosis present

## 2019-02-25 DIAGNOSIS — N3000 Acute cystitis without hematuria: Secondary | ICD-10-CM | POA: Insufficient documentation

## 2019-02-25 DIAGNOSIS — Z8542 Personal history of malignant neoplasm of other parts of uterus: Secondary | ICD-10-CM | POA: Diagnosis not present

## 2019-02-25 DIAGNOSIS — J449 Chronic obstructive pulmonary disease, unspecified: Secondary | ICD-10-CM | POA: Diagnosis not present

## 2019-02-25 HISTORY — DX: Urinary tract infection, site not specified: N39.0

## 2019-02-25 LAB — URINALYSIS, MICROSCOPIC (REFLEX): WBC, UA: >50 WBC/hpf (ref 0–5)

## 2019-02-25 LAB — URINALYSIS, ROUTINE W REFLEX MICROSCOPIC

## 2019-02-25 MED ORDER — SODIUM CHLORIDE 0.9 % IV SOLN
1.0000 g | Freq: Once | INTRAVENOUS | Status: AC
  Administered 2019-02-25: 1 g via INTRAVENOUS
  Filled 2019-02-25: qty 10

## 2019-02-25 MED ORDER — CEPHALEXIN 500 MG PO CAPS: 500.0000 mg | ORAL_CAPSULE | Freq: Three times a day (TID) | ORAL | 0 refills | Status: DC

## 2019-02-25 NOTE — Discharge Instructions (Addendum)
It was our pleasure to provide your ER care today - we hope that you feel better.  Take antibiotic as prescribed. Rest. Drink plenty of fluids. Take acetaminophen or ibuprofen as need.   We did send a urine culture the results of which will be back in 2 days - have your doctor follow up on those results to ensure your antibiotic will effectively treat your infection. If recurrent urine infection(s) - follow up with urologist - discuss possible referral with your doctor.   Return to ER if worse, new symptoms, high fevers, worsening or severe pain, persistent vomiting, weak/faint, severe abdominal or flank pain, or other concern.

## 2019-02-25 NOTE — ED Provider Notes (Signed)
[facility] EMERGENCY DEPARTMENT Provider Note   CSN: [id number] Arrival date & time: 02/25/19  1029     History Chief Complaint  Patient presents with  . Urinary Tract Infection    Heidi Hobbs is a 78 y.o. female.  Patient c/o dysuria for the past several days. Symptoms acute onset, moderate, persistent, felt worse today. States feels same as with prior utis. Takes keflex once daily for prophylaxis. Denies back or flank pain. No fever or chills. No nausea or vomiting. Does feel as if is emptying bladder.   The history is provided by the patient.  Urinary Tract Infection Associated symptoms: no fever, no flank pain, no vaginal discharge and no vomiting        Past Medical History:  Diagnosis Date  . Adenomatous colon polyp 1995, 2011  . Anemia   . Anxiety   . Cancer [facility])    uterine cancer  . Carotid stenosis    40-59% right, less than 40% left  . Cataracts, bilateral   . COPD (chronic obstructive pulmonary disease) ([place])   . Essential hypertension 02/13/2016  . Hyperlipidemia   . Hypertension   . MAC (mycobacterium avium-intracellulare complex)   . Macular degeneration, dry 02/13/2016  . Polio 1959  . Stomach ulcer 1960  . Urinary tract infection   . Vasculitis ([place])    Normocomplementemic urticarial   . Vasculitis ([place]) 02/13/2016   Followed by [facility]     Patient Active Problem List   Diagnosis Date Noted  . S/P cervical spinal fusion 12/05/2018  . Spinal stenosis of cervical region 11/22/2018  . Dysuria 04/08/2018  . Cigarette smoker 07/24/2017  . Venous stasis ulcer ([place]) 05/25/2017  . Atherosclerosis of native arteries of extremity with intermittent claudication ([place]) 05/25/2017  . Occlusion and stenosis of vertebral artery 05/14/2016  . Aortic arch atherosclerosis ([place]) 05/14/2016  . Essential hypertension 02/13/2016  . Vasculitis ([place]) 02/13/2016  . Macular degeneration, dry 02/13/2016  . Acute appendicitis with  perforation and peritoneal abscess 01/23/2016  . Carotid artery disease ([place]) 07/31/2015  . Bronchiectasis without acute exacerbation ([place]) 08/18/2012  . Cough 08/18/2012  . COPD GOLD 0 08/17/2012    Past Surgical History:  Procedure Laterality Date  . ANTERIOR CERVICAL DECOMP/DISCECTOMY FUSION N/A 11/27/2018   Procedure: CERVICAL FIVE TO SIX , CERVICAL SIX TO SEVEN, ANTERIOR CERVICAL DECOMPRESSION/DISCECTOMY FUSION, ALLOGRAFT, PLATE;  Surgeon: [name], MD;  Location: [facility];  Service: Orthopedics;  Laterality: N/A;  . APPENDECTOMY     rupture appendix  . BREAST ENHANCEMENT SURGERY     removal  . CATARACT EXTRACTION    . COLONOSCOPY    . COLONOSCOPY  10/14/09  . LAPAROSCOPIC APPENDECTOMY N/A 01/23/2016   Procedure: APPENDECTOMY LAPAROSCOPIC;  Surgeon: Erroll Luna, MD;  Location: [facility];  Service: General;  Laterality: N/A;  . POLYPECTOMY  2011   x8  . TONSILLECTOMY AND ADENOIDECTOMY    . VAGINAL HYSTERECTOMY  1970   uterine cancer     OB History   No obstetric history on file.     Family History  Problem Relation Age of Onset  . Colon cancer Father   . Esophageal cancer Brother   . Esophageal cancer Brother   . Asthma Daughter        as a child  . Stomach cancer Neg Hx   . Rectal cancer Neg Hx     Social History   Tobacco Use  . Smoking status: Former Smoker  Packs/day: 0.50    Years: 50.00    Pack years: 25.00    Types: Cigarettes    Quit date: 08/01/2017    Years since quitting: 1.5  . Smokeless tobacco: Never Used  Substance Use Topics  . Alcohol use: No    Alcohol/week: 0.0 standard drinks  . Drug use: No    Home Medications Prior to Admission medications   Medication Sig Start Date End Date Taking? Authorizing Provider  albuterol (PROVENTIL HFA;VENTOLIN HFA) 108 (90 Base) MCG/ACT inhaler Inhale 1 puff into the lungs every 6 (six) hours as needed for wheezing or shortness of breath. Patient taking differently: Inhale 2 puffs into the lungs  every 6 (six) hours as needed for wheezing or shortness of breath.  04/07/18   Tanda Rockers, MD  ALPRAZolam Duanne Moron) 0.25 MG tablet Take 0.25 tablets by mouth daily as needed for anxiety.  07/22/17   [provider]  amitriptyline (ELAVIL) 100 MG tablet Take 100 mg by mouth at bedtime.     [provider]  amLODipine (NORVASC) 5 MG tablet Take 5 mg by mouth daily.     [provider]  atenolol (TENORMIN) 50 MG tablet Take 50 mg by mouth daily.    [provider]  atorvastatin (LIPITOR) 40 MG tablet Take 40 mg by mouth every other day. At night    [provider]  cephALEXin (KEFLEX) 250 MG capsule Take 250 mg by mouth daily.    [provider]  cholecalciferol (VITAMIN D3) 25 MCG (1000 UT) tablet Take 1,000 Units by mouth daily.    [provider]  diclofenac sodium (VOLTAREN) 1 % GEL Apply 4 g topically 4 (four) times daily. Patient not taking: Reported on 12/27/2018 12/06/18   [name], MD  dicyclomine (BENTYL) 10 MG capsule Take 1 capsule (10 mg total) by mouth 3 (three) times daily before meals. Patient not taking: Reported on 12/27/2018 07/26/18   Ladene Artist, MD  docusate sodium (COLACE) 100 MG capsule You can buy this at any drug store, over the counter and follow package instructions Patient taking differently: Take 100 mg by mouth daily.  02/01/16   Earnstine Regal, PA-C  EPINEPHrine 0.3 mg/0.3 mL IJ SOAJ injection Inject 0.3 mLs into the muscle once as needed for anaphylaxis.     [provider]  ezetimibe (ZETIA) 10 MG tablet Take 10 mg by mouth at bedtime.     [provider]  famotidine (PEPCID) 20 MG tablet Take 20 mg by mouth daily.    [provider]  hydrochlorothiazide (HYDRODIURIL) 25 MG tablet Take 25 mg by mouth daily.    [provider]  HYDROcodone-acetaminophen (NORCO) 5-325 MG tablet Take 1-2 tablets by mouth every 6 (six) hours as needed for moderate pain. Patient  not taking: Reported on 12/27/2018 11/28/18   [name], MD  HYDROcodone-acetaminophen [facility]) 5-325 MG tablet Take 1 tablet by mouth every 4 (four) hours as needed for severe pain. 12/27/18   Sherwood Gambler, MD  HYDROcodone-acetaminophen (NORCO/VICODIN) 5-325 MG tablet Take 1 tablet by mouth every 6 (six) hours as needed for moderate pain. 12/20/18   Lanae Crumbly, PA-C  HYDROcodone-acetaminophen (NORCO/VICODIN) 5-325 MG tablet Take 1 tablet by mouth every 8 (eight) hours as needed for moderate pain. 02/12/19 02/12/20  Magnant, Charles L, PA-C  losartan (COZAAR) 100 MG tablet Take 100 mg by mouth daily.    [provider]  methylPREDNISolone (MEDROL DOSEPAK) 4 MG TBPK tablet Take per  package instructions for Medrol Dose pack 12/27/18   Sherwood Gambler, MD  omeprazole (PRILOSEC OTC) 20 MG tablet Take 20 mg by mouth at bedtime.    [provider]  Probiotic CAPS Take 1 capsule by mouth daily.    [provider]  saccharomyces boulardii (FLORASTOR) 250 MG capsule You can buy this over the counter at any drugstore and use for the next month. Patient not taking: Reported on 12/27/2018 02/01/16   Earnstine Regal, PA-C  vitamin B-12 (CYANOCOBALAMIN) 500 MCG tablet Take 500 mcg by mouth daily.    [provider]    Allergies    Eggs or egg-derived products  Review of Systems   Review of Systems  Constitutional: Negative for chills and fever.  HENT: Negative for sore throat.   Eyes: Negative for redness.  Respiratory: Negative for shortness of breath.   Cardiovascular: Negative for chest pain.  Gastrointestinal: Negative for vomiting.  Genitourinary: Positive for dysuria. Negative for flank pain and vaginal discharge.  Musculoskeletal: Negative for back pain.  Skin: Negative for rash.  Neurological: Negative for headaches.  Hematological: Does not bruise/bleed easily.  Psychiatric/Behavioral: Negative for confusion.    Physical Exam Updated Vital  Signs BP 103/62 (BP Location: Right Arm)   Pulse 95   Temp 99.2 F (37.3 C) (Oral)   Resp 20   Ht 1.727 m (_0 )   Wt 65.8 kg   SpO2 93%   BMI 22.05 kg/m   Physical Exam Vitals and nursing note reviewed.  Constitutional:      Appearance: Normal appearance. She is well-developed.  HENT:     Head: Atraumatic.     Nose: Nose normal.     Mouth/Throat:     Mouth: Mucous membranes are moist.  Eyes:     General: No scleral icterus.    Conjunctiva/sclera: Conjunctivae normal.  Neck:     Trachea: No tracheal deviation.  Cardiovascular:     Rate and Rhythm: Normal rate and regular rhythm.     Pulses: Normal pulses.     Heart sounds: Normal heart sounds. No murmur. No friction rub. No gallop.   Pulmonary:     Effort: Pulmonary effort is normal. No respiratory distress.     Breath sounds: Normal breath sounds.  Abdominal:     General: Bowel sounds are normal. There is no distension.     Palpations: Abdomen is soft.     Tenderness: There is no abdominal tenderness.  Genitourinary:    Comments: No cva tenderness.  Musculoskeletal:        General: No swelling.     Cervical back: Normal range of motion and neck supple. No rigidity. No muscular tenderness.  Skin:    General: Skin is warm and dry.     Findings: No rash ( ).  Neurological:     Mental Status: She is alert.     Comments: Alert, speech normal.   Psychiatric:        Mood and Affect: Mood normal.     ED Results / Procedures / Treatments   Labs (all labs ordered are listed, but only abnormal results are displayed) Results for orders placed or performed during the hospital encounter of 02/25/19  UA  Result Value Ref Range   Color, Urine ORANGE (A) YELLOW   APPearance TURBID (A) CLEAR   Specific Gravity, Urine  1.005 - 1.030    TEST NOT REPORTED DUE TO COLOR INTERFERENCE OF URINE PIGMENT   pH  5.0 - 8.0  TEST NOT REPORTED DUE TO COLOR INTERFERENCE OF URINE PIGMENT   Glucose, UA (A) NEGATIVE mg/dL    TEST  NOT REPORTED DUE TO COLOR INTERFERENCE OF URINE PIGMENT   Hgb urine dipstick (A) NEGATIVE    TEST NOT REPORTED DUE TO COLOR INTERFERENCE OF URINE PIGMENT   Bilirubin Urine (A) NEGATIVE    TEST NOT REPORTED DUE TO COLOR INTERFERENCE OF URINE PIGMENT   Ketones, ur (A) NEGATIVE mg/dL    TEST NOT REPORTED DUE TO COLOR INTERFERENCE OF URINE PIGMENT   Protein, ur (A) NEGATIVE mg/dL    TEST NOT REPORTED DUE TO COLOR INTERFERENCE OF URINE PIGMENT   Nitrite (A) NEGATIVE    TEST NOT REPORTED DUE TO COLOR INTERFERENCE OF URINE PIGMENT   Leukocytes,Ua (A) NEGATIVE    TEST NOT REPORTED DUE TO COLOR INTERFERENCE OF URINE PIGMENT  Urinalysis, Microscopic (reflex)  Result Value Ref Range   RBC / HPF 6-10 0 - 5 RBC/hpf   WBC, UA >50 0 - 5 WBC/hpf   Bacteria, UA FEW (A) NONE SEEN   Squamous Epithelial / LPF 6-10 0 - 5   EKG None  Radiology No results found.  Procedures Procedures (including critical care time)  Medications Ordered in ED Medications - No data to display  ED Course  I have reviewed the triage vital signs and the nursing notes.  Pertinent labs & imaging results that were available during my care of the patient were reviewed by me and considered in my medical decision making (see chart for details).    MDM Rules/Calculators/A&P  Labs sent.   Reviewed nursing notes and prior charts for additional history. Last urine culture in system 08/2018 [facility], Kebsiella, susceptible to ceph.   Labs reviewed/interpreted by me - ua w > 50 wbc c/w uti. Urine culture sent.   Rocephin iv.   Pt tolerating po. No nv.   rx for home.    Final Clinical Impression(s) / ED Diagnoses Final diagnoses:  None    Rx / DC Orders ED Discharge Orders    None       Lajean Saver, MD 02/25/19 1212

## 2019-02-25 NOTE — ED Triage Notes (Addendum)
Pt c/o burning with urination for several days. Pt has tried AZO but only helped for one day. Pt had UTI several months ago. Pt is currently on a daily 6 month regimen of Keflex 250 mg, pt reports that for she has been taking about 4 pills a day for the past few days. Pt reports that she took 3 1/2 hydrocodone pills last night due to pain.

## 2019-02-27 ENCOUNTER — Other Ambulatory Visit: Payer: Self-pay

## 2019-02-27 ENCOUNTER — Encounter: Payer: Self-pay | Admitting: Orthopaedic Surgery

## 2019-02-27 ENCOUNTER — Ambulatory Visit (INDEPENDENT_AMBULATORY_CARE_PROVIDER_SITE_OTHER): Payer: Medicare Other | Admitting: Orthopaedic Surgery

## 2019-02-27 VITALS — Ht 68.0 in | Wt 145.0 lb

## 2019-02-27 DIAGNOSIS — Z981 Arthrodesis status: Secondary | ICD-10-CM

## 2019-02-27 DIAGNOSIS — M7552 Bursitis of left shoulder: Secondary | ICD-10-CM

## 2019-02-27 DIAGNOSIS — M7551 Bursitis of right shoulder: Secondary | ICD-10-CM

## 2019-02-27 DIAGNOSIS — M778 Other enthesopathies, not elsewhere classified: Secondary | ICD-10-CM

## 2019-02-27 LAB — URINE CULTURE: Culture: >=100000 — AB

## 2019-02-27 MED ORDER — HYDROCODONE-ACETAMINOPHEN 5-325 MG PO TABS: 1.0000 | ORAL_TABLET | Freq: Four times a day (QID) | ORAL | 0 refills | Status: DC | PRN

## 2019-02-28 ENCOUNTER — Telehealth: Payer: Self-pay

## 2019-02-28 NOTE — Progress Notes (Signed)
Office Visit Note   Patient: Heidi Hobbs           Date of Birth: 25-Sep-1940           MRN: [id number] Visit Date: 02/27/2019              Requested by: Shon Baton, [address] [place],  [place] [address] PCP: Shon Baton, MD   Assessment & Plan: Visit Diagnoses:  1. Right shoulder tendonitis   2. Bilateral shoulder bursitis   3. S/P cervical spinal fusion     Plan: Final prescription of hydrocodone 15 tablets prescribed we discussed she may do better if she stops it completely and not take any for 2 weeks.  We will proceed with an MRI scan right shoulder rule out rotator cuff tear.  Neck incisions well-healed and reflexes are intact with good relief her preop radicular pain.  This appears to be more due to likely rotator cuff tearing of the right shoulder and follow-up after MRI scan is obtained.  Her daughter was present today and included in the discussion and outlined treatment plan.  Follow-Up Instructions: return after MRI shoulder  Orders:  Orders Placed This Encounter  Procedures  . MR SHOULDER RIGHT WO CONTRAST   Meds ordered this encounter  Medications  . HYDROcodone-acetaminophen (NORCO/VICODIN) 5-325 MG tablet    Sig: Take 1 tablet by mouth every 6 (six) hours as needed for moderate pain.    Dispense:  15 tablet    Refill:  0      Procedures: No procedures performed   Clinical Data: No additional findings.   Subjective: Chief Complaint  Patient presents with  . Neck - Follow-up    11/27/2018 C5-6, C6-7 ACDF, Allograft, Plate    HPI 78 year old female returns post two-level cervical fusion with increased problems with her shoulder right and left.  Injections previously helped.  She has difficulty worse with the right arm than left with lifting.  She has appointment with Dr. Colon Flattery but this was canceled pending rescheduling at the beginning of the year.  She has had Pseudomonas UTI and just had her antibiotics switched to Cipro which she  needs to pick up today.  She has had some pain in her trochanters difficulty walking.  Last x-ray showed good position of plate graft and screws without motion.  She has been taking pain tablets total of almost 400 tablets since June and some of her pain may be related to hyperalgesia and decreasing the narcotic medication.  She has noticed improvement in her neck preop symptoms and hands feel much better.  Review of Systems 14 point system update unchanged other than mentioned in HPI.   Objective: Vital Signs: Ht _0  (1.727 m)   Wt 145 lb (65.8 kg)   BMI 22.05 kg/m   Physical Exam Constitutional:      Appearance: She is well-developed.  HENT:     Head: Normocephalic.     Right Ear: External ear normal.     Left Ear: External ear normal.  Eyes:     Pupils: Pupils are equal, round, and reactive to light.  Neck:     Thyroid: No thyromegaly.     Trachea: No tracheal deviation.  Cardiovascular:     Rate and Rhythm: Normal rate.  Pulmonary:     Effort: Pulmonary effort is normal.  Abdominal:     Palpations: Abdomen is soft.  Skin:    General: Skin is warm and dry.  Neurological:  Mental Status: She is alert and oriented to person, place, and time.  Psychiatric:        Behavior: Behavior normal.     Ortho Exam patient has intact upper extremity reflexes.  She withdraws with palpation of long head of biceps tendon.  Positive impingement test right negative Hawkins test right and left.  Mild impingement left shoulder.  She is tender over the trochanters.  No lower extremity hyperreflexia anterior tib EHL gastrocsoleus is intact.  Specialty Comments:  No specialty comments available.  Imaging: No results found.   PMFS History: Patient Active Problem List   Diagnosis Date Noted  . S/P cervical spinal fusion 12/05/2018  . Spinal stenosis of cervical region 11/22/2018  . Dysuria 04/08/2018  . Cigarette smoker 07/24/2017  . Venous stasis ulcer ([place]) 05/25/2017  .  Atherosclerosis of native arteries of extremity with intermittent claudication ([place]) 05/25/2017  . Occlusion and stenosis of vertebral artery 05/14/2016  . Aortic arch atherosclerosis ([place]) 05/14/2016  . Essential hypertension 02/13/2016  . Vasculitis ([place]) 02/13/2016  . Macular degeneration, dry 02/13/2016  . Acute appendicitis with perforation and peritoneal abscess 01/23/2016  . Carotid artery disease ([place]) 07/31/2015  . Bronchiectasis without acute exacerbation ([place]) 08/18/2012  . Cough 08/18/2012  . COPD GOLD 0 08/17/2012   Past Medical History:  Diagnosis Date  . Adenomatous colon polyp 1995, 2011  . Anemia   . Anxiety   . Cancer [facility])    uterine cancer  . Carotid stenosis    40-59% right, less than 40% left  . Cataracts, bilateral   . COPD (chronic obstructive pulmonary disease) ([place])   . Essential hypertension 02/13/2016  . Hyperlipidemia   . Hypertension   . MAC (mycobacterium avium-intracellulare complex)   . Macular degeneration, dry 02/13/2016  . Polio 1959  . Stomach ulcer 1960  . Urinary tract infection   . Vasculitis ([place])    Normocomplementemic urticarial   . Vasculitis ([place]) 02/13/2016   Followed by [facility]     Family History  Problem Relation Age of Onset  . Colon cancer Father   . Esophageal cancer Brother   . Esophageal cancer Brother   . Asthma Daughter        as a child  . Stomach cancer Neg Hx   . Rectal cancer Neg Hx     Past Surgical History:  Procedure Laterality Date  . ANTERIOR CERVICAL DECOMP/DISCECTOMY FUSION N/A 11/27/2018   Procedure: CERVICAL FIVE TO SIX , CERVICAL SIX TO SEVEN, ANTERIOR CERVICAL DECOMPRESSION/DISCECTOMY FUSION, ALLOGRAFT, PLATE;  Surgeon: [name], MD;  Location: [facility];  Service: Orthopedics;  Laterality: N/A;  . APPENDECTOMY     rupture appendix  . BREAST ENHANCEMENT SURGERY     removal  . CATARACT EXTRACTION    . COLONOSCOPY    . COLONOSCOPY  10/14/09  . LAPAROSCOPIC APPENDECTOMY N/A 01/23/2016    Procedure: APPENDECTOMY LAPAROSCOPIC;  Surgeon: Erroll Luna, MD;  Location: [facility];  Service: General;  Laterality: N/A;  . POLYPECTOMY  2011   x8  . TONSILLECTOMY AND ADENOIDECTOMY    . VAGINAL HYSTERECTOMY  1970   uterine cancer   Social History   Occupational History  . 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 Smoking status: Former Smoker    Packs/day: 0.50    Years: 50.00    Pack years: 25.00    Types: Cigarettes    Quit date: 08/01/2017    Years since quitting: 1.5  . Smokeless  tobacco: Never Used  Substance and Sexual Activity  . Alcohol use: No    Alcohol/week: 0.0 standard drinks  . Drug use: No  . Sexual activity: Not on file

## 2019-02-28 NOTE — Progress Notes (Signed)
ED Antimicrobial Stewardship Positive Culture Follow Up   Heidi Hobbs is an 78 y.o. female who presented to [facility] on 02/25/2019 with a chief complaint of  Chief Complaint  Patient presents with  . Urinary Tract Infection   Presenting with dysuria for several days (on keflex daily on ppx). Negative back/flank pain. UA few bacteria, WBC >50. No previous cx recently in Epic with pseudomonas growth. Scr 0.51.  Recent Results (from the past 720 hour(s))  Urine culture     Status: Abnormal   Collection Time: 02/25/19 11:30 AM   Specimen: Urine, Clean Catch  Result Value Ref Range Status   Specimen Description   Final    URINE, CLEAN CATCH Performed at [facility], [address] [place], [place] [address]    Special Requests   Final    NONE Performed at [facility], [address] [place], [place] [address]    Culture >=100,000 COLONIES/mL PSEUDOMONAS AERUGINOSA (A)  Final   Report Status 02/27/2019 FINAL  Final   Organism ID, Bacteria PSEUDOMONAS AERUGINOSA (A)  Final      Susceptibility   Pseudomonas aeruginosa - MIC*    CEFTAZIDIME 2 SENSITIVE Sensitive     CIPROFLOXACIN <=0.25 SENSITIVE Sensitive     GENTAMICIN <=1 SENSITIVE Sensitive     IMIPENEM 2 SENSITIVE Sensitive     PIP/TAZO <=4 SENSITIVE Sensitive     CEFEPIME <=1 SENSITIVE Sensitive     * >=100,000 COLONIES/mL PSEUDOMONAS AERUGINOSA    [x]  Treated with cephalexin, organism resistant to prescribed antimicrobial  New antibiotic prescription: Stop treatment dose of cephalexin. Order fosfomycin 3 g PO once. If any cost issues with medication, call [phone].  ED Provider: Valeria Batman, PA-C  Antonietta Jewel, PharmD, [facility] Clinical Pharmacist  02/28/2019, 9:21 AM Clinical Pharmacist Monday - Friday phone -  [phone] Saturday - Sunday phone - [phone]

## 2019-02-28 NOTE — Telephone Encounter (Signed)
Pt need change in ABX for UC ED 02/25/2019  Has already seen PCP and made change per family. Encouraged to F/U with PCP if any further problems

## 2019-03-07 DIAGNOSIS — N398 Other specified disorders of urinary system: Secondary | ICD-10-CM | POA: Insufficient documentation

## 2019-03-07 DIAGNOSIS — N390 Urinary tract infection, site not specified: Secondary | ICD-10-CM | POA: Diagnosis not present

## 2019-03-12 ENCOUNTER — Telehealth (HOSPITAL_COMMUNITY): Payer: Self-pay

## 2019-03-12 NOTE — Telephone Encounter (Signed)

## 2019-03-13 ENCOUNTER — Ambulatory Visit (HOSPITAL_COMMUNITY)
Admission: RE | Admit: 2019-03-13 | Discharge: 2019-03-13 | Disposition: A | Discharge status: Home / Self Care | Payer: Medicare Other | Source: Ambulatory Visit | Attending: Vascular Surgery | Admitting: Vascular Surgery

## 2019-03-13 ENCOUNTER — Encounter: Payer: Self-pay | Admitting: Vascular Surgery

## 2019-03-13 ENCOUNTER — Ambulatory Visit (INDEPENDENT_AMBULATORY_CARE_PROVIDER_SITE_OTHER): Payer: Medicare Other | Admitting: Vascular Surgery

## 2019-03-13 ENCOUNTER — Other Ambulatory Visit: Payer: Self-pay

## 2019-03-13 VITALS — BP 142/72 | HR 77 | Temp 97.3°F | Resp 16 | Ht 68.0 in | Wt 149.0 lb

## 2019-03-13 DIAGNOSIS — I6523 Occlusion and stenosis of bilateral carotid arteries: Secondary | ICD-10-CM | POA: Diagnosis not present

## 2019-03-13 NOTE — Progress Notes (Signed)
Patient name: Heidi Hobbs MRN: [id number] DOB: 12-12-1940 Sex: female  REASON FOR VISIT: Carotid surveillance with interval six-month follow-up  HPI: Heidi Hobbs is a 78 y.o. female that presents for interval six-month follow-up for carotid surveillance.  We have been following her right carotid where she has had elevated velocities consistent with a 40 to 60% stenosis.  She denies any history of TIAs or strokes in the interim.  Over the last 6 months she has had no new neurologic changes consistent with TIA or stroke.  Her main issue is been cervical issues following neck surgery.  She says she scheduled to see a neurosurgeon now.  Past Medical History:  Diagnosis Date  . Adenomatous colon polyp 1995, 2011  . Anemia   . Anxiety   . Cancer [facility])    uterine cancer  . Carotid stenosis    40-59% right, less than 40% left  . Cataracts, bilateral   . COPD (chronic obstructive pulmonary disease) ([place])   . Essential hypertension 02/13/2016  . Hyperlipidemia   . Hypertension   . MAC (mycobacterium avium-intracellulare complex)   . Macular degeneration, dry 02/13/2016  . Polio 1959  . Stomach ulcer 1960  . Urinary tract infection   . Vasculitis ([place])    Normocomplementemic urticarial   . Vasculitis ([place]) 02/13/2016   Follow[facility] by [facility]     Past Surgical History:  Procedure Laterality Date  . ANTERIOR CERVICAL DECOMP/DISCECTOMY FUSION N/A 11/27/2018   Procedure: CERVICAL FIVE TO SIX , CERVICAL SIX TO SEVEN, ANTERIOR CERVICAL DECOMPRESSION/DISCECTOMY FUSION, ALLOGRAFT, PLATE;  Surgeon: [name], MD;  Location: [facility];  Service: Orthopedics;  Laterality: N/A;  . APPENDECTOMY     rupture appendix  . BREAST ENHANCEMENT SURGERY     removal  . CATARACT EXTRACTION    . COLONOSCOPY    . COLONOSCOPY  10/14/09  . LAPAROSCOPIC APPENDECTOMY N/A 01/23/2016   Procedure: APPENDECTOMY LAPAROSCOPIC;  Surgeon: Erroll Luna, MD;  Location: [facility];  Service: General;   Laterality: N/A;  . POLYPECTOMY  2011   x8  . TONSILLECTOMY AND ADENOIDECTOMY    . VAGINAL HYSTERECTOMY  1970   uterine cancer    Family History  Problem Relation Age of Onset  . Colon cancer Father   . Esophageal cancer Brother   . Esophageal cancer Brother   . Asthma Daughter        as a child  . Stomach cancer Neg Hx   . Rectal cancer Neg Hx     SOCIAL HISTORY: Social History   Tobacco Use  . Smoking status: Former Smoker    Packs/day: 0.50    Years: 50.00    Pack years: 25.00    Types: Cigarettes    Quit date: 08/01/2017    Years since quitting: 1.6  . Smokeless tobacco: Never Used  Substance Use Topics  . Alcohol use: No    Alcohol/week: 0.0 standard drinks    Allergies  Allergen Reactions  . Eggs Or Egg-Derived Products     Skin nodules    Current Outpatient Medications  Medication Sig Dispense Refill  . albuterol (PROVENTIL HFA;VENTOLIN HFA) 108 (90 Base) MCG/ACT inhaler Inhale 1 puff into the lungs every 6 (six) hours as needed for wheezing or shortness of breath. (Patient taking differently: Inhale 2 puffs into the lungs every 6 (six) hours as needed for wheezing or shortness of breath. ) 1 Inhaler 1  . ALPRAZolam (XANAX) 0.25 MG tablet Take 0.25 tablets by mouth  daily as needed for anxiety.   1  . amitriptyline (ELAVIL) 100 MG tablet Take 100 mg by mouth at bedtime.     Marland Kitchen amLODipine (NORVASC) 5 MG tablet Take 5 mg by mouth daily.     Marland Kitchen atenolol (TENORMIN) 50 MG tablet Take 50 mg by mouth daily.    Marland Kitchen atorvastatin (LIPITOR) 40 MG tablet Take 40 mg by mouth every other day. At night    . cholecalciferol (VITAMIN D3) 25 MCG (1000 UT) tablet Take 1,000 Units by mouth daily.    . diclofenac sodium (VOLTAREN) 1 % GEL Apply 4 g topically 4 (four) times daily. 150 g 0  . dicyclomine (BENTYL) 10 MG capsule Take 1 capsule (10 mg total) by mouth 3 (three) times daily before meals. 90 capsule 11  . docusate sodium (COLACE) 100 MG capsule You can buy this at any drug  store, over the counter and follow package instructions (Patient taking differently: Take 100 mg by mouth daily. ) 10 capsule 0  . EPINEPHrine 0.3 mg/0.3 mL IJ SOAJ injection Inject 0.3 mLs into the muscle once as needed for anaphylaxis.     Marland Kitchen ezetimibe (ZETIA) 10 MG tablet Take 10 mg by mouth at bedtime.     . famotidine (PEPCID) 20 MG tablet Take 20 mg by mouth daily.    . hydrochlorothiazide (HYDRODIURIL) 25 MG tablet Take 25 mg by mouth daily.    Marland Kitchen HYDROcodone-acetaminophen (NORCO) 5-325 MG tablet Take 1-2 tablets by mouth every 6 (six) hours as needed for moderate pain. 30 tablet 0  . HYDROcodone-acetaminophen (NORCO) 5-325 MG tablet Take 1 tablet by mouth every 4 (four) hours as needed for severe pain. 10 tablet 0  . HYDROcodone-acetaminophen (NORCO/VICODIN) 5-325 MG tablet Take 1 tablet by mouth every 6 (six) hours as needed for moderate pain. 40 tablet 0  . HYDROcodone-acetaminophen (NORCO/VICODIN) 5-325 MG tablet Take 1 tablet by mouth every 8 (eight) hours as needed for moderate pain. 20 tablet 0  . HYDROcodone-acetaminophen (NORCO/VICODIN) 5-325 MG tablet Take 1 tablet by mouth every 6 (six) hours as needed for moderate pain. 15 tablet 0  . losartan (COZAAR) 100 MG tablet Take 100 mg by mouth daily.    . methylPREDNISolone (MEDROL DOSEPAK) 4 MG TBPK tablet Take per package instructions for Medrol Dose pack 21 tablet 0  . omeprazole (PRILOSEC OTC) 20 MG tablet Take 20 mg by mouth at bedtime.    . Probiotic CAPS Take 1 capsule by mouth daily.    Marland Kitchen saccharomyces boulardii (FLORASTOR) 250 MG capsule You can buy this over the counter at any drugstore and use for the next month.    . vitamin B-12 (CYANOCOBALAMIN) 500 MCG tablet Take 500 mcg by mouth daily.    . cephALEXin (KEFLEX) 250 MG capsule Take 250 mg by mouth daily.    . cephALEXin (KEFLEX) 500 MG capsule Take 1 capsule (500 mg total) by mouth 3 (three) times daily. (Patient not taking: Reported on 03/13/2019) 15 capsule 0   Current  Facility-Administered Medications  Medication Dose Route Frequency Provider Last Rate Last Admin  . diclofenac sodium (VOLTAREN) 1 % transdermal gel 4 g  4 g Topical QID [name], MD        REVIEW OF SYSTEMS:  _0  denotes positive finding, _1  denotes negative finding Cardiac  Comments:  Chest pain or chest pressure:    Shortness of breath upon exertion:    Short of breath when lying flat:    Irregular heart rhythm:  Vascular    Pain in calf, thigh, or hip brought on by ambulation:    Pain in feet at night that wakes you up from your sleep:     Blood clot in your veins:    Leg swelling:         Pulmonary    Oxygen at home:    Productive cough:     Wheezing:         Neurologic    Sudden weakness in arms or legs:     Sudden numbness in arms or legs:     Sudden onset of difficulty speaking or slurred speech:    Temporary loss of vision in one eye:     Problems with dizziness:         Gastrointestinal    Blood in stool:     Vomited blood:         Genitourinary    Burning when urinating:     Blood in urine:        Psychiatric    Major depression:         Hematologic    Bleeding problems:    Problems with blood clotting too easily:        Skin    Rashes or ulcers:        Constitutional    Fever or chills:      PHYSICAL EXAM: Vitals:   03/13/19 1502 03/13/19 1506  BP: (!) 148/73 (!) 142/72  Pulse: 77 77  Resp: 16   Temp: (!) 97.3 F (36.3 C)   TempSrc: Temporal   SpO2: 95%   Weight: 149 lb (67.6 kg)   Height: _0  (1.727 m)     GENERAL: The patient is a well-nourished female, in no acute distress. The vital signs are documented above. CARDIAC: There is a regular rate and rhythm.  PULMONARY: There is good air exchange bilaterally without wheezing or rales. ABDOMEN: Soft and non-tender with normal pitched bowel sounds.  MUSCULOSKELETAL: There are no major deformities or cyanosis. NEUROLOGIC: No focal weakness or paresthesias are detected. CN  II-XII grossly intact. SKIN: There are no ulcers or rashes noted. PSYCHIATRIC: The patient has a normal affect.  DATA:   Independently reviewed her carotid duplex and her left carotid velocities consistent with 1 to 39% stenosis and the right velocities are stable consistent with a 40 to 59% stenosis.  Assessment/Plan:  78 year old female who presents for ongoing carotid surveillance in the setting of a known 40 to 59% right ICA stenosis.  Her velocities are stable over the last 6 months with no significant progression.  Discussed with her and her daughter on the phone that we do not offer carotid surgery unless she has more than 80% stenosis in the setting of asymptomatic disease.  I will plan to see her back in 1 year with carotid duplex.  Discussed concerning signs that should warrant evaluation sooner.  Marty Heck, MD [facility] Office: [phone] Pager: [place]

## 2019-03-19 ENCOUNTER — Ambulatory Visit
Admission: RE | Admit: 2019-03-19 | Discharge: 2019-03-19 | Disposition: A | Discharge status: Home / Self Care | Payer: Medicare Other | Source: Ambulatory Visit | Attending: Orthopaedic Surgery | Admitting: Orthopaedic Surgery

## 2019-03-19 ENCOUNTER — Other Ambulatory Visit: Payer: Self-pay

## 2019-03-19 DIAGNOSIS — M25511 Pain in right shoulder: Secondary | ICD-10-CM | POA: Diagnosis not present

## 2019-03-19 DIAGNOSIS — M7551 Bursitis of right shoulder: Secondary | ICD-10-CM

## 2019-03-19 DIAGNOSIS — M778 Other enthesopathies, not elsewhere classified: Secondary | ICD-10-CM

## 2019-03-21 ENCOUNTER — Ambulatory Visit (INDEPENDENT_AMBULATORY_CARE_PROVIDER_SITE_OTHER): Payer: Medicare Other | Admitting: Orthopaedic Surgery

## 2019-03-21 ENCOUNTER — Encounter: Payer: Self-pay | Admitting: Orthopaedic Surgery

## 2019-03-21 ENCOUNTER — Ambulatory Visit: Payer: Medicare Other | Admitting: Rheumatology

## 2019-03-21 ENCOUNTER — Other Ambulatory Visit: Payer: Self-pay

## 2019-03-21 VITALS — Ht 68.0 in | Wt 150.0 lb

## 2019-03-21 DIAGNOSIS — M2550 Pain in unspecified joint: Secondary | ICD-10-CM

## 2019-03-21 DIAGNOSIS — M7541 Impingement syndrome of right shoulder: Secondary | ICD-10-CM

## 2019-03-21 NOTE — Progress Notes (Signed)
Office Visit Note   Patient: Heidi Hobbs           Date of Birth: May 14, 1940           MRN: [id number] Visit Date: 03/21/2019              Requested by: Shon Baton, [place],  [place] [id number] PCP: Shon Baton, MD   Assessment & Plan: Visit Diagnoses:  1. Impingement syndrome of right shoulder   2. Polyarthralgia     Plan: Injection performed.  Patient states she is to see rheumatologist versus multiple areas of joint complaints and this may represent some fibromyalgia..She is taking amitriptyline 100 mg at night.  Follow-Up Instructions: Return if symptoms worsen or fail to improve.   Orders:  Orders Placed This Encounter  Procedures  . Large Joint Inj: R subacromial bursa  . Ambulatory referral to Rheumatology   No orders of the defined types were placed in this encounter.     Procedures: Large Joint Inj: R subacromial bursa on 03/21/2019 10:32 AM Indications: pain Details: 22 G 1.5 in needle  Arthrogram: No  Medications: 4 mL bupivacaine 0.25 %; 40 mg methylPREDNISolone acetate 40 MG/ML; 0.5 mL lidocaine 1 % Outcome: tolerated well, no immediate complications Procedure, treatment alternatives, risks and benefits explained, specific risks discussed. Consent was given by the patient. Immediately prior to procedure a time out was called to verify the correct patient, procedure, equipment, support staff and site/side marked as required. Patient was prepped and draped in the usual sterile fashion.       Clinical Data: No additional findings.   Subjective: Chief Complaint  Patient presents with  . Right Shoulder - Pain, Follow-up    MRI Right Shoulder Review     HPI 79 year old female with ongoing problems with bilateral shoulder pain particular with outstretch reaching and overhead activities.  She is also had some knee and hip pain.  Appointments with Dr. Estanislado Pandy were canceled due to [facility] and she is waiting for reschedule.  She is  requesting possible referral to another rheumatologist if possible.  Patient had MRI scan 03/19/2019 right shoulder which shows partial-thickness tear of the anterior aspect the distal supraspinatus and small glenohumeral effusion.  She did not have any muscle atrophy biceps tendon was in good position.  Patient is requesting a repeat injection which previously gave her some relief.  She has been weaning her pain medication only taking half tablet.  She denies numbness or tingling in her fingers.  Recent problems with UTI which was treated.  Review of Systems 14 point update unchanged.  Problems with rash, esophagitis swallowing.  Two-level cervical fusion September 2020 solid on x-ray.   Objective: Vital Signs: Ht _0  (1.727 m)   Wt 150 lb (68 kg)   BMI 22.81 kg/m   Physical Exam Constitutional:      Appearance: She is well-developed.  HENT:     Head: Normocephalic.     Right Ear: External ear normal.     Left Ear: External ear normal.  Eyes:     Pupils: Pupils are equal, round, and reactive to light.  Neck:     Thyroid: No thyromegaly.     Trachea: No tracheal deviation.  Cardiovascular:     Rate and Rhythm: Normal rate.  Pulmonary:     Effort: Pulmonary effort is normal.  Abdominal:     Palpations: Abdomen is soft.  Skin:    General: Skin is warm and dry.  Neurological:     Mental Status: She is alert and oriented to person, place, and time.  Psychiatric:        Behavior: Behavior normal.     Ortho Exam positive impingement right shoulder.  No brachial plexus tenderness.  No pain with neck flexion.  Mild positive impingement left shoulder.  Negative Yergason negative Hawkins test.  Upper extremity reflexes are 2+ and symmetrical normal heel toe gait no lower extremity clonus.  Patient has tenderness over greater trochanter also some of her pes bursa.  Specialty Comments:  No specialty comments available.  Imaging: No results found.   PMFS History: Patient Active  Problem List   Diagnosis Date Noted  . Impingement syndrome of right shoulder 03/23/2019  . S/P cervical spinal fusion 12/05/2018  . Spinal stenosis of cervical region 11/22/2018  . Dysuria 04/08/2018  . Cigarette smoker 07/24/2017  . Venous stasis ulcer ([place]) 05/25/2017  . Atherosclerosis of native arteries of extremity with intermittent claudication ([employer]) 05/25/2017  . Occlusion and stenosis of vertebral artery 05/14/2016  . Aortic arch atherosclerosis ([place]) 05/14/2016  . Essential hypertension 02/13/2016  . Vasculitis ([place]) 02/13/2016  . Macular degeneration, dry 02/13/2016  . Acute appendicitis with perforation and peritoneal abscess 01/23/2016  . Carotid artery disease ([place]) 07/31/2015  . Bronchiectasis without acute exacerbation ([place]) 08/18/2012  . Cough 08/18/2012  . COPD GOLD 0 08/17/2012   Past Medical History:  Diagnosis Date  . Adenomatous colon polyp 1995, 2011  . Anemia   . Anxiety   . Cancer [facility])    uterine cancer  . Carotid stenosis    40-59% right, less than 40% left  . Cataracts, bilateral   . COPD (chronic obstructive pulmonary disease) ([place])   . Essential hypertension 02/13/2016  . Hyperlipidemia   . Hypertension   . MAC (mycobacterium avium-intracellulare complex)   . Macular degeneration, dry 02/13/2016  . Polio 1959  . Stomach ulcer 1960  . Urinary tract infection   . Vasculitis ([place])    Normocomplementemic urticarial   . Vasculitis ([place]) 02/13/2016   Follow[facility] by [facility]     Family History  Problem Relation Age of Onset  . Colon cancer Father   . Esophageal cancer Brother   . Esophageal cancer Brother   . Asthma Daughter        as a child  . Stomach cancer Neg Hx   . Rectal cancer Neg Hx     Past Surgical History:  Procedure Laterality Date  . ANTERIOR CERVICAL DECOMP/DISCECTOMY FUSION N/A 11/27/2018   Procedure: CERVICAL FIVE TO SIX , CERVICAL SIX TO SEVEN, ANTERIOR CERVICAL DECOMPRESSION/DISCECTOMY FUSION, ALLOGRAFT, PLATE;   Surgeon: [name], MD;  Location: [place];  Service: Orthopedics;  Laterality: N/A;  . APPENDECTOMY     rupture appendix  . BREAST ENHANCEMENT SURGERY     removal  . CATARACT EXTRACTION    . COLONOSCOPY    . COLONOSCOPY  10/14/09  . LAPAROSCOPIC APPENDECTOMY N/A 01/23/2016   Procedure: APPENDECTOMY LAPAROSCOPIC;  Surgeon: Erroll Luna, MD;  Location: [place];  Service: General;  Laterality: N/A;  . POLYPECTOMY  2011   x8  . TONSILLECTOMY AND ADENOIDECTOMY    . VAGINAL HYSTERECTOMY  1970   uterine cancer   Social History   Occupational History  . 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 Smoking status: Former Smoker    Packs/day: 0.50    Years: 50.00    Pack years: 25.00  Types: Cigarettes    Quit date: 08/01/2017    Years since quitting: 1.6  . Smokeless tobacco: Never Used  Substance and Sexual Activity  . Alcohol use: No    Alcohol/week: 0.0 standard drinks  . Drug use: No  . Sexual activity: Not on file

## 2019-03-23 ENCOUNTER — Other Ambulatory Visit (HOSPITAL_COMMUNITY): Payer: Self-pay | Admitting: Vascular Surgery

## 2019-03-23 DIAGNOSIS — I6529 Occlusion and stenosis of unspecified carotid artery: Secondary | ICD-10-CM

## 2019-03-23 DIAGNOSIS — M7541 Impingement syndrome of right shoulder: Secondary | ICD-10-CM | POA: Insufficient documentation

## 2019-03-23 MED ORDER — BUPIVACAINE HCL 0.25 % IJ SOLN
4.0000 mL | INTRAMUSCULAR | Status: AC | PRN
  Administered 2019-03-21: 4 mL via INTRA_ARTICULAR

## 2019-03-23 MED ORDER — METHYLPREDNISOLONE ACETATE 40 MG/ML IJ SUSP
40.0000 mg | INTRAMUSCULAR | Status: AC | PRN
  Administered 2019-03-21: 40 mg via INTRA_ARTICULAR

## 2019-03-23 MED ORDER — LIDOCAINE HCL 1 % IJ SOLN
0.5000 mL | INTRAMUSCULAR | Status: AC | PRN
  Administered 2019-03-21: .5 mL

## 2019-03-30 DIAGNOSIS — M2550 Pain in unspecified joint: Secondary | ICD-10-CM | POA: Diagnosis not present

## 2019-03-30 DIAGNOSIS — Z6822 Body mass index (BMI) 22.0-22.9, adult: Secondary | ICD-10-CM | POA: Diagnosis not present

## 2019-03-30 DIAGNOSIS — M150 Primary generalized (osteo)arthritis: Secondary | ICD-10-CM | POA: Diagnosis not present

## 2019-03-30 DIAGNOSIS — R5383 Other fatigue: Secondary | ICD-10-CM | POA: Diagnosis not present

## 2019-03-30 DIAGNOSIS — Z111 Encounter for screening for respiratory tuberculosis: Secondary | ICD-10-CM | POA: Diagnosis not present

## 2019-03-30 DIAGNOSIS — M353 Polymyalgia rheumatica: Secondary | ICD-10-CM | POA: Diagnosis not present

## 2019-04-05 DIAGNOSIS — H353221 Exudative age-related macular degeneration, left eye, with active choroidal neovascularization: Secondary | ICD-10-CM | POA: Diagnosis not present

## 2019-04-10 DIAGNOSIS — N390 Urinary tract infection, site not specified: Secondary | ICD-10-CM | POA: Diagnosis not present

## 2019-04-10 DIAGNOSIS — R339 Retention of urine, unspecified: Secondary | ICD-10-CM | POA: Diagnosis not present

## 2019-04-10 DIAGNOSIS — R1032 Left lower quadrant pain: Secondary | ICD-10-CM | POA: Diagnosis not present

## 2019-04-16 ENCOUNTER — Other Ambulatory Visit: Payer: Self-pay

## 2019-04-16 ENCOUNTER — Ambulatory Visit (INDEPENDENT_AMBULATORY_CARE_PROVIDER_SITE_OTHER): Payer: Medicare Other | Admitting: Neurology

## 2019-04-16 ENCOUNTER — Encounter: Payer: Self-pay | Admitting: Neurology

## 2019-04-16 DIAGNOSIS — I776 Arteritis, unspecified: Secondary | ICD-10-CM

## 2019-04-16 DIAGNOSIS — M791 Myalgia, unspecified site: Secondary | ICD-10-CM

## 2019-04-16 DIAGNOSIS — M542 Cervicalgia: Secondary | ICD-10-CM | POA: Diagnosis not present

## 2019-04-16 NOTE — Progress Notes (Signed)
[facility]    Nerve / Sites Muscle Latency Ref. Amplitude Ref. Rel Amp Segments Distance Velocity Ref. Area    ms ms mV mV %  cm m/s m/s mVms  L Median - APB     Wrist APB 3.3 ?4.4 4.0 ?4.0 100 Wrist - APB 7   12.6     Upper arm APB 7.5  3.5  86.5 Upper arm - Wrist 23 55 ?49 11.6  R Median - APB     Wrist APB 3.8 ?4.4 5.7 ?4.0 100 Wrist - APB 7   20.4     Upper arm APB 8.1  5.2  92.4 Upper arm - Wrist 22 52 ?49 19.7  L Ulnar - ADM     Wrist ADM 2.7 ?3.3 10.0 ?6.0 100 Wrist - ADM 7   29.2     B.Elbow ADM 6.5  8.7  87.1 B.Elbow - Wrist 20 52 ?49 28.3     A.Elbow ADM 8.5  8.4  96.7 A.Elbow - B.Elbow 10 50 ?49 27.6         A.Elbow - Wrist      R Ulnar - ADM     Wrist ADM 2.5 ?3.3 10.4 ?6.0 100 Wrist - ADM 7   28.9     B.Elbow ADM 6.1  9.2  88.5 B.Elbow - Wrist 19 54 ?49 28.8     A.Elbow ADM 7.9  8.5  92 A.Elbow - B.Elbow 10 55 ?49 28.5         A.Elbow - Wrist                 SNC    Nerve / Sites Rec. Site Peak Lat Ref.  Amp Ref. Segments Distance    ms ms V V  cm  L Median - Orthodromic (Dig II, Mid palm)     Dig II Wrist 2.9 ?3.4 18 ?10 Dig II - Wrist 13  R Median - Orthodromic (Dig II, Mid palm)     Dig II Wrist 3.6 ?3.4 13 ?10 Dig II - Wrist 13  L Ulnar - Orthodromic, (Dig V, Mid palm)     Dig V Wrist 2.8 ?3.1 7 ?5 Dig V - Wrist 11  R Ulnar - Orthodromic, (Dig V, Mid palm)     Dig V Wrist 2.5 ?3.1 8 ?5 Dig V - Wrist 3             F  Wave    Nerve F Lat Ref.   ms ms  L Ulnar - ADM 29.2 ?32.0  R Ulnar - ADM 28.6 ?32.0

## 2019-04-16 NOTE — Progress Notes (Signed)
Please refer to EMG and nerve conduction procedure note.  

## 2019-04-16 NOTE — Procedures (Signed)
     HISTORY:  Heidi Hobbs is an 79 year old patient with a history of prior cervical spine surgery.  The patient has had a 6 or 49-month history of discomfort in the neck and shoulders and sometimes around the hips as well.  She was felt to have polymyalgia rheumatica, she has been on prednisone for 2 weeks and has had a dramatic improvement in her pain.  The patient is being evaluated for possible cervical radiculopathy.  NERVE CONDUCTION STUDIES:  Nerve conduction studies were performed on both upper extremities. The distal motor latencies and motor amplitudes for the median and ulnar nerves were within normal limits. The nerve conduction velocities for these nerves were also normal. The sensory latencies for the median and ulnar nerves were normal, with exception of a minimal prolongation for the right median sensory latency. The F wave latencies for the ulnar nerves were within normal limits.   EMG STUDIES:  EMG study was performed on the right upper extremity:  The first dorsal interosseous muscle reveals 2 to 4 K units with full recruitment. No fibrillations or positive waves were noted. The abductor pollicis brevis muscle reveals 2 to 4 K units with full recruitment. No fibrillations or positive waves were noted. The extensor indicis proprius muscle reveals 1 to 3 K units with full recruitment. No fibrillations or positive waves were noted. The pronator teres muscle reveals 2 to 3 K units with full recruitment. No fibrillations or positive waves were noted. The biceps muscle reveals 1 to 2 K units with full recruitment. No fibrillations or positive waves were noted. The triceps muscle reveals 2 to 4 K units with full recruitment. No fibrillations or positive waves were noted. The anterior deltoid muscle reveals 2 to 3 K units with full recruitment. No fibrillations or positive waves were noted. The cervical paraspinal muscles were tested at 2 levels. No abnormalities of insertional  activity were seen at either level tested. There was poor relaxation.   IMPRESSION:  Nerve conduction studies done on both upper extremities were within normal limits with exception of a minimal prolongation of the right median sensory latency.  EMG evaluation of the right upper extremity was unremarkable, no evidence of a cervical radiculopathy was seen, no evidence of a myopathic disorder was noted.  Heidi Alexanders MD 04/16/2019 4:06 PM  [facility] [address] [place], [place] [address]  Phone [phone] Fax [phone]

## 2019-05-02 DIAGNOSIS — M353 Polymyalgia rheumatica: Secondary | ICD-10-CM | POA: Diagnosis not present

## 2019-05-02 DIAGNOSIS — L958 Other vasculitis limited to the skin: Secondary | ICD-10-CM | POA: Diagnosis not present

## 2019-05-02 DIAGNOSIS — M150 Primary generalized (osteo)arthritis: Secondary | ICD-10-CM | POA: Diagnosis not present

## 2019-05-02 DIAGNOSIS — M2550 Pain in unspecified joint: Secondary | ICD-10-CM | POA: Diagnosis not present

## 2019-05-24 DIAGNOSIS — Z961 Presence of intraocular lens: Secondary | ICD-10-CM | POA: Diagnosis not present

## 2019-05-24 DIAGNOSIS — H353210 Exudative age-related macular degeneration, right eye, stage unspecified: Secondary | ICD-10-CM | POA: Diagnosis not present

## 2019-05-24 DIAGNOSIS — H353221 Exudative age-related macular degeneration, left eye, with active choroidal neovascularization: Secondary | ICD-10-CM | POA: Diagnosis not present

## 2019-05-24 DIAGNOSIS — H04129 Dry eye syndrome of unspecified lacrimal gland: Secondary | ICD-10-CM | POA: Diagnosis not present

## 2019-06-07 NOTE — Progress Notes (Signed)
_0  ID: Heidi Hobbs, female    DOB: April 02, 1940, 79 y.o.   MRN: [id number]  No chief complaint on file.   Referring provider: Shon Baton, MD  HPI:  79 year old female former smoker followed in our office for COPD, cough, bronchiectasis  PMH: CAD, hypertension, vasculitis, Smoker/ Smoking History: Former smoker Maintenance:  None  Pt of: [name]  06/08/2019  - Visit   79 year old female former smoker followed in our office for bronchiectasis, COPD, chronic cough.  Patient also is followed by rheumatology Dr. Trudie Reed for polymyalgia rheumatica and vasculitis.  Patient is maintained on prednisone for management of this.  Patient reports that she is about 4 to 6 weeks of increased cough, congestion, discolored mucus.  She also reports there is a rattling in her chest.  2019 high-resolution CT chest shows bronchiectasis with mucous plugging.  Patient's daughter also reports that she has been wheezing.   Questionaires / Pulmonary Flowsheets:   MMRC: mMRC Dyspnea Scale mMRC Score  06/08/2019 0    Tests:   12/20/2018-Rh factor-negative 12/20/2018-ANA-negative 12/20/2018-sed rate-22 1014/2020-CBC with differential-eosinophils absolute 0.1, eosinophils relative 1 11/27/2018-chest x-ray-emphysema, no acute disease  08/31/2017-pulmonary function test-FVC 2.95 (95% predicted), postbronchodilator ratio 68, postbronchodilator FEV1 2.23 (95% predicted) no bronchodilator response, mid flow reversibility, DLCO 16.75 (57% predicted)  10/14/2017-CT chest high-res-moderate centrilobular emphysema and diffuse bronchial wall thickening, no acute consolidative airspace disease or lung masses, scattered mild cylindrical varicoid bronchiectasis throughout both lungs predominantly in bilateral upper lobes and right middle lobe with the associated mild patchy tree-in-bud opacities and scattered mucoid impaction, findings suggestive of chronic infectious bronchiolitis due to atypical  Mycobacterium infection, moderate centrilobular emphysema, patchy lobular air trapping in both lungs indicative of small airways disease,  01/03/2018-CT maxillofacial-clear paranasal sinuses   FENO:  No results found for: NITRICOXIDE  PFT: PFT Results Latest Ref Rng & Units 08/31/2017  FVC-Pre L 2.95  FVC-Predicted Pre % 95  FVC-Post L 3.27  FVC-Predicted Post % 105  Pre FEV1/FVC % % 71  Post FEV1/FCV % % 68  FEV1-Pre L 2.09  FEV1-Predicted Pre % 90  FEV1-Post L 2.23  DLCO UNC% % 57  DLCO COR %Predicted % 66  TLC L 6.53  TLC % Predicted % 117  RV % Predicted % 136    WALK:  No flowsheet data found.  Imaging: DG Chest 2 View  Result Date: 06/08/2019 CLINICAL DATA:  Cough. EXAM: CHEST - 2 VIEW COMPARISON:  November 27, 2018. FINDINGS: The heart size and mediastinal contours are within normal limits. Both lungs are clear. Atherosclerosis of thoracic aorta is noted. No pneumothorax or pleural effusion is noted. The visualized skeletal structures are unremarkable. IMPRESSION: No active cardiopulmonary disease. Aortic Atherosclerosis (ICD10-I70.0). Electronically Signed   By: Marijo Conception M.D.   On: 06/08/2019 10:39    Lab Results:  CBC    Component Value Date/Time   WBC 13.6 (H) 12/27/2018 1200   RBC 4.31 12/27/2018 1200   HGB 12.5 12/27/2018 1200   HCT 38.8 12/27/2018 1200   PLT 416 (H) 12/27/2018 1200   MCV 90.0 12/27/2018 1200   MCH 29.0 12/27/2018 1200   MCHC 32.2 12/27/2018 1200   RDW 14.3 12/27/2018 1200   LYMPHSABS 1.9 12/27/2018 1200   MONOABS 1.2 (H) 12/27/2018 1200   EOSABS 0.1 12/27/2018 1200   BASOSABS 0.1 12/27/2018 1200    BMET    Component Value Date/Time   NA 136 12/27/2018 1200   K 3.5 12/27/2018 1200  CL 100 12/27/2018 1200   CO2 24 12/27/2018 1200   GLUCOSE 121 (H) 12/27/2018 1200   BUN 8 12/27/2018 1200   CREATININE 0.51 12/27/2018 1200   CALCIUM 8.8 (L) 12/27/2018 1200   GFRNONAA >60 12/27/2018 1200   GFRAA >60 12/27/2018 1200     BNP No results found for: BNP  ProBNP No results found for: PROBNP  Specialty Problems      Pulmonary Problems   COPD GOLD 1    Followed in Pulmonary clinic/ [facility]/ [facility]  Quit smoking 07/2017   - PFT's  08/31/2017  FEV1 2.09  (90 % ) ratio 71  p no % improvement from saba p nothing  prior to study with DLCO  57 % corrects to 66 % for alv volume          Bronchiectasis without acute exacerbation (HCC)    Followed in Pulmonary clinic/ [facility]/ [facility]  Cough since 1980   01/28/12 CT chest:  Mild bronchiectatic change in the right middle lobe and  lingula. In view of these changes, MAC would be a consideration - HRCT 10/13/2017  1. Scattered mild cylindrical and varicoid bronchiectasis, tree-in-bud opacities and mucoid impaction in both lungs, predominantly in the upper lobes and right middle lobe, mildly progressed since 10/06/2016 chest CT, particularly in anterior left upper lobe. 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 2. Moderate centrilobular emphysema with diffuse bronchial wall thickening, suggesting COPD. - Sinus CT 01/02/18 > Clear paranasal sinuses - 01/04/2018 added prn augmentin for flares  - 04/07/2018 added flutter, stopped macrodantin       Cough    Followed in Pulmonary clinic/ [facility]/ [facility]           Allergies  Allergen Reactions  . Eggs Or Egg-Derived Products     Skin nodules    Immunization History  Administered Date(s) Administered  . Influenza Whole 12/14/2011, 12/13/2016  . Influenza, High Dose Seasonal PF 11/21/2017  . Tdap 10/16/2011    Past Medical History:  Diagnosis Date  . Adenomatous colon polyp 1995, 2011  . Anemia   . Anxiety   . Cancer [facility])    uterine cancer  . Carotid stenosis    40-59% right, less than 40% left  . Cataracts, bilateral   . COPD (chronic obstructive pulmonary disease) ([place])   . Essential hypertension 02/13/2016  .  Hyperlipidemia   . Hypertension   . MAC (mycobacterium avium-intracellulare complex)   . Macular degeneration, dry 02/13/2016  . Polio 1959  . Stomach ulcer 1960  . Urinary tract infection   . Vasculitis ([place])    Normocomplementemic urticarial   . Vasculitis ([place]) 02/13/2016   Followed by [facility]     Tobacco History: Social History   Tobacco Use  Smoking Status Former Smoker  . Packs/day: 0.50  . Years: 50.00  . Pack years: 25.00  . Types: Cigarettes  . Quit date: 08/01/2017  . Years since quitting: 1.8  Smokeless Tobacco Never Used   Counseling given: Not Answered   Continue to not smoke  Outpatient Encounter Medications as of 06/08/2019  Medication Sig  . albuterol (PROVENTIL HFA;VENTOLIN HFA) 108 (90 Base) MCG/ACT inhaler Inhale 1 puff into the lungs every 6 (six) hours as needed for wheezing or shortness of breath. (Patient taking differently: Inhale 2 puffs into the lungs every 6 (six) hours as needed for wheezing or shortness of breath. )  . ALPRAZolam (XANAX) 0.25 MG tablet Take 0.25 tablets  by mouth daily as needed for anxiety.   Marland Kitchen amitriptyline (ELAVIL) 100 MG tablet Take 100 mg by mouth at bedtime.   Marland Kitchen amLODipine (NORVASC) 5 MG tablet Take 5 mg by mouth daily.   Marland Kitchen atenolol (TENORMIN) 50 MG tablet Take 50 mg by mouth daily.  Marland Kitchen atorvastatin (LIPITOR) 40 MG tablet Take 40 mg by mouth every other day. At night  . cholecalciferol (VITAMIN D3) 25 MCG (1000 UT) tablet Take 1,000 Units by mouth daily.  . diclofenac sodium (VOLTAREN) 1 % GEL Apply 4 g topically 4 (four) times daily.  Marland Kitchen docusate sodium (COLACE) 100 MG capsule You can buy this at any drug store, over the counter and follow package instructions (Patient taking differently: Take 100 mg by mouth daily. )  . EPINEPHrine 0.3 mg/0.3 mL IJ SOAJ injection Inject 0.3 mLs into the muscle once as needed for anaphylaxis.   Marland Kitchen ezetimibe (ZETIA) 10 MG tablet Take 10 mg by mouth at bedtime.   . famotidine  (PEPCID) 20 MG tablet Take 1 tablet (20 mg total) by mouth daily.  . hydrochlorothiazide (HYDRODIURIL) 25 MG tablet Take 25 mg by mouth daily.  Marland Kitchen losartan (COZAAR) 100 MG tablet Take 100 mg by mouth daily.  Marland Kitchen omeprazole (PRILOSEC OTC) 20 MG tablet Take 1 tablet (20 mg total) by mouth at bedtime.  . predniSONE (DELTASONE) 5 MG tablet Take 10 mg by mouth daily.  . Probiotic CAPS Take 1 capsule by mouth daily.  . traMADol (ULTRAM) 50 MG tablet SMARTSIG:1 Tablet(s) By Mouth Every 12 Hours PRN  . vitamin B-12 (CYANOCOBALAMIN) 500 MCG tablet Take 500 mcg by mouth daily.  . [DISCONTINUED] famotidine (PEPCID) 20 MG tablet Take 20 mg by mouth daily.  . [DISCONTINUED] omeprazole (PRILOSEC OTC) 20 MG tablet Take 20 mg by mouth at bedtime.  Marland Kitchen doxycycline (VIBRA-TABS) 100 MG tablet Take 1 tablet (100 mg total) by mouth 2 (two) times daily.  Marland Kitchen Respiratory Therapy Supplies (FLUTTER) DEVI 1 Device by Does not apply route 2 (two) times daily.  . [DISCONTINUED] cephALEXin (KEFLEX) 250 MG capsule Take 250 mg by mouth daily.  . [DISCONTINUED] cephALEXin (KEFLEX) 500 MG capsule Take 1 capsule (500 mg total) by mouth 3 (three) times daily. (Patient not taking: Reported on 03/13/2019)  . [DISCONTINUED] dicyclomine (BENTYL) 10 MG capsule Take 1 capsule (10 mg total) by mouth 3 (three) times daily before meals.  . [DISCONTINUED] HYDROcodone-acetaminophen (NORCO) 5-325 MG tablet Take 1-2 tablets by mouth every 6 (six) hours as needed for moderate pain. (Patient not taking: Reported on 03/21/2019)  . [DISCONTINUED] HYDROcodone-acetaminophen (NORCO) 5-325 MG tablet Take 1 tablet by mouth every 4 (four) hours as needed for severe pain. (Patient not taking: Reported on 03/21/2019)  . [DISCONTINUED] HYDROcodone-acetaminophen (NORCO/VICODIN) 5-325 MG tablet Take 1 tablet by mouth every 6 (six) hours as needed for moderate pain. (Patient not taking: Reported on 03/21/2019)  . [DISCONTINUED] HYDROcodone-acetaminophen (NORCO/VICODIN)  5-325 MG tablet Take 1 tablet by mouth every 8 (eight) hours as needed for moderate pain. (Patient not taking: Reported on 03/21/2019)  . [DISCONTINUED] HYDROcodone-acetaminophen (NORCO/VICODIN) 5-325 MG tablet Take 1 tablet by mouth every 6 (six) hours as needed for moderate pain.  . [DISCONTINUED] methylPREDNISolone (MEDROL DOSEPAK) 4 MG TBPK tablet Take per package instructions for Medrol Dose pack (Patient not taking: Reported on 03/21/2019)  . [DISCONTINUED] saccharomyces boulardii (FLORASTOR) 250 MG capsule You can buy this over the counter at any drugstore and use for the next month.   Facility-Administered Encounter Medications as  of 06/08/2019  Medication  . diclofenac sodium (VOLTAREN) 1 % transdermal gel 4 g     Review of Systems  Review of Systems  Constitutional: Positive for appetite change and fatigue. Negative for activity change and fever.  HENT: Positive for congestion. Negative for sinus pressure, sinus pain and sore throat.   Respiratory: Positive for cough, shortness of breath and wheezing.   Cardiovascular: Negative for chest pain and palpitations.  Gastrointestinal: Negative for diarrhea, nausea and vomiting.  Musculoskeletal: Negative for arthralgias.  Neurological: Negative for dizziness.  Psychiatric/Behavioral: Negative for sleep disturbance. The patient is not nervous/anxious.      Physical Exam  BP 118/70 (BP Location: Left Arm, Patient Position: Sitting, Cuff Size: Normal)   Pulse 78   Temp 98.4 F (36.9 C) (Temporal)   Ht _0  (1.727 m)   Wt 146 lb (66.2 kg)   SpO2 94% Comment: on RA  BMI 22.20 kg/m   Wt Readings from Last 5 Encounters:  06/08/19 146 lb (66.2 kg)  03/21/19 150 lb (68 kg)  03/13/19 149 lb (67.6 kg)  02/27/19 145 lb (65.8 kg)  02/25/19 145 lb (65.8 kg)    BMI Readings from Last 5 Encounters:  06/08/19 22.20 kg/m  03/21/19 22.81 kg/m  03/13/19 22.66 kg/m  02/27/19 22.05 kg/m  02/25/19 22.05 kg/m     Physical  Exam Vitals and nursing note reviewed.  Constitutional:      General: She is not in acute distress.    Appearance: Normal appearance. She is normal weight.  HENT:     Head: Normocephalic and atraumatic.     Right Ear: Tympanic membrane, ear canal and external ear normal. There is no impacted cerumen.     Left Ear: Tympanic membrane, ear canal and external ear normal. There is no impacted cerumen.     Nose: Rhinorrhea present. No congestion.     Mouth/Throat:     Mouth: Mucous membranes are moist.     Pharynx: Oropharynx is clear.  Eyes:     Pupils: Pupils are equal, round, and reactive to light.  Cardiovascular:     Rate and Rhythm: Normal rate and regular rhythm.     Pulses: Normal pulses.     Heart sounds: Normal heart sounds. No murmur.  Pulmonary:     Effort: Pulmonary effort is normal. No respiratory distress.     Breath sounds: No decreased air movement. No decreased breath sounds, wheezing, rhonchi or rales.     Comments: Scattered squeaks, favors upper lobes, no audible wheezing Musculoskeletal:     Cervical back: Normal range of motion.  Skin:    General: Skin is warm and dry.     Capillary Refill: Capillary refill takes less than 2 seconds.  Neurological:     General: No focal deficit present.     Mental Status: She is alert and oriented to person, place, and time. Mental status is at baseline.     Gait: Gait normal.  Psychiatric:        Mood and Affect: Mood normal.        Behavior: Behavior normal.        Thought Content: Thought content normal.        Judgment: Judgment normal.       Assessment & Plan:   Polymyalgia rheumatica ([place]) Plan: Continue follow-up with rheumatology Continue prednisone  Vasculitis ([place]) Plan: Continue follow-up with [facility] Continue follow-up with rheumatology   Bronchiectasis without acute exacerbation ([place]) 2019 CT chest shows bronchiectasis  as well as favoring MAI Increased cough-4 to 6 weeks Not using flutter  valve  Plan: Start flutter valve today Chest x-ray today We will treat with doxycycline today Sputum cultures today Follow-up in 8 weeks  COPD GOLD 1 Known emphysema on CT imaging Not currently on a maintenance inhaler Former smoker  Plan: We will continue to clinically monitor May need to consider trial of maintenance inhaler such as a LAMA LABA at next office visit    Return in about 2 months (around 08/08/2019), or if symptoms worsen or fail to improve, for Follow up with Dr. Melvyn Novas.   [name], NP 06/08/2019   This appointment required 8 minutes of patient care (this includes precharting, chart review, review of results, face-to-face care, etc.).

## 2019-06-08 ENCOUNTER — Other Ambulatory Visit: Payer: Self-pay

## 2019-06-08 ENCOUNTER — Encounter: Payer: Self-pay | Admitting: Pulmonary Disease

## 2019-06-08 ENCOUNTER — Ambulatory Visit (INDEPENDENT_AMBULATORY_CARE_PROVIDER_SITE_OTHER): Payer: Medicare Other

## 2019-06-08 ENCOUNTER — Ambulatory Visit (INDEPENDENT_AMBULATORY_CARE_PROVIDER_SITE_OTHER): Payer: Medicare Other | Admitting: Pulmonary Disease

## 2019-06-08 VITALS — BP 118/70 | HR 78 | Temp 98.4°F | Ht 68.0 in | Wt 146.0 lb

## 2019-06-08 DIAGNOSIS — K219 Gastro-esophageal reflux disease without esophagitis: Secondary | ICD-10-CM | POA: Diagnosis not present

## 2019-06-08 DIAGNOSIS — I776 Arteritis, unspecified: Secondary | ICD-10-CM | POA: Diagnosis not present

## 2019-06-08 DIAGNOSIS — J479 Bronchiectasis, uncomplicated: Secondary | ICD-10-CM

## 2019-06-08 DIAGNOSIS — M353 Polymyalgia rheumatica: Secondary | ICD-10-CM | POA: Insufficient documentation

## 2019-06-08 DIAGNOSIS — J449 Chronic obstructive pulmonary disease, unspecified: Secondary | ICD-10-CM

## 2019-06-08 DIAGNOSIS — R05 Cough: Secondary | ICD-10-CM | POA: Diagnosis not present

## 2019-06-08 MED ORDER — DOXYCYCLINE HYCLATE 100 MG PO TABS: 100.0000 mg | ORAL_TABLET | Freq: Two times a day (BID) | ORAL | 0 refills | Status: DC

## 2019-06-08 MED ORDER — FLUTTER DEVI: 1.0000 | Freq: Two times a day (BID) | 0 refills | Status: AC

## 2019-06-08 MED ORDER — FAMOTIDINE 20 MG PO TABS: 20.0000 mg | ORAL_TABLET | Freq: Every day | ORAL | 3 refills | Status: DC

## 2019-06-08 MED ORDER — OMEPRAZOLE MAGNESIUM 20 MG PO TBEC: 20.0000 mg | DELAYED_RELEASE_TABLET | Freq: Every day | ORAL | 3 refills | Status: DC

## 2019-06-08 NOTE — Progress Notes (Signed)
Patient identification verified. Patient's recent chest x ray reviewed. Per Wyn Quaker NP, chest x ray shows no acute changes. There are no plan of care changes at this time. Please keep your follow up appointment and call us if symptoms worsen or you do not feel like you are improving. Patient verbalized understanding of results.

## 2019-06-08 NOTE — Patient Instructions (Addendum)
You were seen today by [name], NP  for:   Thank you for coming in today.  Please review the instructions below.  We will get an x-ray today as well as treat you with antibiotics.  We will also start you on a flutter valve.  I would like for you to use this twice daily.  You can also use Mucinex.  Make sure that you are hydrating well with water.  I have refilled your acid reflux medications.  We will bring you back in our office to see Dr. Melvyn Novas in 6 to 8 weeks.  Sooner if you need it.  Take care and stay safe,  Heidi Hobbs  1. Bronchiectasis without acute exacerbation (HCC)  - doxycycline (VIBRA-TABS) 100 MG tablet; Take 1 tablet (100 mg total) by mouth 2 (two) times daily.  Dispense: 14 tablet; Refill: 0  Bronchiectasis: This is the medical term which indicates that you have damage, dilated airways making you more susceptible to respiratory infection. Use a flutter valve 10 breaths twice a day or 4 to 5 breaths 4-5 times a day to help clear mucus out Let us know if you have cough with change in mucus color or fevers or chills.  At that point you would need an antibiotic. Maintain a healthy nutritious diet, eating whole foods Take your medications as prescribed   We will test her sputum You are provided cups today, please follow the instructions provided  2. Gastroesophageal reflux disease, unspecified whether esophagitis present  - famotidine (PEPCID) 20 MG tablet; Take 1 tablet (20 mg total) by mouth daily.  Dispense: 30 tablet; Refill: 3 - omeprazole (PRILOSEC OTC) 20 MG tablet; Take 1 tablet (20 mg total) by mouth at bedtime.  Dispense: 30 tablet; Refill: 3  GERD management: >>>Avoid laying flat until 2 hours after meals >>>Elevate head of the bed including entire chest >>>Reduce size of meals and amount of fat, acid, spices, caffeine and sweets >>>If you are smoking, Please stop! >>>Decrease alcohol consumption >>>Work on maintaining a healthy weight with normal BMI     3.  Polymyalgia rheumatica ([place]) 4. Vasculitis ([place])  Continue to follow-up with rheumatology  Continue prednisone as outlined   We recommend today:  Orders Placed This Encounter  Procedures  . Respiratory or Resp and Sputum Culture    Standing Status:   Future    Standing Expiration Date:   06/07/2020  . AFB Culture & Smear    Standing Status:   Future    Standing Expiration Date:   06/07/2020    Order Specific Question:   Release to patient    Answer:   Immediate  . Fungus Culture & Smear    Standing Status:   Future    Standing Expiration Date:   06/07/2020  . DG Chest 2 View    Standing Status:   Future    Standing Expiration Date:   08/07/2020    Order Specific Question:   Reason for Exam (SYMPTOM  OR DIAGNOSIS REQUIRED)    Answer:   cough    Order Specific Question:   Preferred imaging location?    Answer:   Internal    Order Specific Question:   Radiology Contrast Protocol - do NOT remove file path    Answer:   [email or web address]   Orders Placed This Encounter  Procedures  . Respiratory or Resp and Sputum Culture  . AFB Culture & Smear  . Fungus Culture & Smear  . DG Chest 2 View  Meds ordered this encounter  Medications  . famotidine (PEPCID) 20 MG tablet    Sig: Take 1 tablet (20 mg total) by mouth daily.    Dispense:  30 tablet    Refill:  3  . omeprazole (PRILOSEC OTC) 20 MG tablet    Sig: Take 1 tablet (20 mg total) by mouth at bedtime.    Dispense:  30 tablet    Refill:  3  . doxycycline (VIBRA-TABS) 100 MG tablet    Sig: Take 1 tablet (100 mg total) by mouth 2 (two) times daily.    Dispense:  14 tablet    Refill:  0    Follow Up:    Return in about 2 months (around 08/08/2019), or if symptoms worsen or fail to improve, for Follow up with Dr. Melvyn Novas.   Please do your part to reduce the spread of COVID-19:      Reduce your risk of any infection  and COVID19 by using the similar precautions used for avoiding  the common cold or flu:  Marland Kitchen Wash your hands often with soap and warm water for at least 20 seconds.  If soap and water are not readily available, use an alcohol-based hand sanitizer with at least 60% alcohol.  . If coughing or sneezing, cover your mouth and nose by coughing or sneezing into the elbow areas of your shirt or coat, into a tissue or into your sleeve (not your hands). [name] A MASK when in public  . Avoid shaking hands with others and consider head nods or verbal greetings only. . Avoid touching your eyes, nose, or mouth with unwashed hands.  . Avoid close contact with people who are sick. . Avoid places or events with large numbers of people in one location, like concerts or sporting events. . If you have some symptoms but not all symptoms, continue to monitor at home and seek medical attention if your symptoms worsen. . If you are having a medical emergency, call 911.   [place] / e-Visit: [email or web address]         [facility]: [facility]: [id number]                   [facility]: [id number]     It is flu season:   >>> Best ways to protect herself from the flu: Receive the yearly flu vaccine, practice good hand hygiene washing with soap and also using hand sanitizer when available, eat a nutritious meals, get adequate rest, hydrate appropriately   Please contact the office if your symptoms worsen or you have concerns that you are not improving.   Thank you for choosing [facility] for your healthcare, and for allowing [place] to partner with you on your healthcare journey. I am thankful to be able to provide care to you today.   Wyn Quaker FNP-C    Bronchiectasis  Bronchiectasis is a condition in which the airways in the lungs (bronchi) are damaged and widened. The condition makes it hard for the lungs  to get rid of mucus, and it causes mucus to gather in the bronchi. This condition often leads to lung infections, which can make the condition worse. What are the causes? You can be born with this condition or you can develop it later in life. Common causes of this condition include:  Cystic fibrosis.  Repeated lung infections, such as pneumonia or tuberculosis.  An object or other blockage in the lungs.  Breathing in fluid, food, or other objects (aspiration).  A problem with the immune system and lung structure that is present at birth (congenital). Sometimes the cause is not known. What are the signs or symptoms? Common symptoms of this condition include:  A daily cough that brings up mucus and lasts for more than 3 weeks.  Lung infections that happen often.  Shortness of breath and wheezing.  Weakness and fatigue. How is this diagnosed? This condition is diagnosed with tests, such as:  Chest X-rays or CT scans. These are done to check for changes in the lungs.  Breathing tests. These are done to check how well your lungs are working.  A test of a sample of your saliva (sputum culture). This test is done to check for infection.  Blood tests and other tests. These are done to check for related diseases or causes. How is this treated? Treatment for this condition depends on the severity of the illness and its cause. Treatment may include:  Medicines that loosen mucus so it can be coughed up (expectorants).  Medicines that relax the muscles of the bronchi (bronchodilators).  Antibiotic medicines to prevent or treat infection.  Physical therapy to help clear mucus from the lungs. Techniques may include: ? Postural drainage. This is when you sit or lie in certain positions so that mucus can drain by gravity. ? Chest percussion. This involves tapping the chest or back with a cupped hand. ? Chest vibration. For this therapy, a hand or special equipment vibrates your chest  and back.  Surgery to remove the affected part of the lung. This may be done in severe cases. Follow these instructions at home: Medicines  Take over-the-counter and prescription medicines only as told by your health care provider.  If you were prescribed an antibiotic medicine, take it as told by your health care provider. Do not stop taking the antibiotic even if you start to feel better.  Avoid taking sedatives and antihistamines unless your health care provider tells you to take them. These medicines tend to thicken the mucus in the lungs. Managing symptoms  Perform breathing exercises or techniques to clear your lungs as told by your health care provider.  Consider using a cold steam vaporizer or humidifier in your room or home to help loosen secretions.  If you have a cough that gets worse at night, try sleeping in a semi-upright position. General instructions  Get plenty of rest.  Drink enough fluid to keep your urine clear or pale yellow.  Stay inside when pollution and ozone levels are high.  Stay up to date with vaccinations and immunizations.  Avoid cigarette smoke and other lung irritants.  Do not use any products that contain nicotine or tobacco, such as cigarettes and e-cigarettes. If you need help quitting, ask your health care provider.  Keep all follow-up visits as told by your health care provider. This is important. Contact a health care provider if:  You cough up more sputum than before and the sputum is yellow or green in color.  You have a fever.  You cannot control your cough and are losing sleep. Get help right away if:  You cough up blood.  You have chest pain.  You have increasing shortness of breath.  You have pain that gets worse or is not controlled with medicines.  You have a fever and your symptoms suddenly get worse. Summary  Bronchiectasis is a condition in which  the airways in the lungs (bronchi) are damaged and widened. The  condition makes it hard for the lungs to get rid of mucus, and it causes mucus to gather in the bronchi.  Treatment usually includes therapy to help clear mucus from the lungs.  Stay up to date with vaccinations and immunizations. This information is not intended to replace advice given to you by your health care provider. Make sure you discuss any questions you have with your health care provider. Document Revised: 02/11/2017 Document Reviewed: 04/05/2016 Elsevier Patient Education  2020 [employer].

## 2019-06-08 NOTE — Assessment & Plan Note (Signed)
Plan: Continue follow-up with [facility] Continue follow-up with rheumatology 

## 2019-06-08 NOTE — Assessment & Plan Note (Signed)
Plan: Continue follow-up with rheumatology Continue prednisone

## 2019-06-08 NOTE — Assessment & Plan Note (Signed)
Refilled patient's Pepcid and patient's omeprazole

## 2019-06-08 NOTE — Assessment & Plan Note (Signed)
2019 CT chest shows bronchiectasis as well as favoring MAI Increased cough-4 to 6 weeks Not using flutter valve  Plan: Start flutter valve today Chest x-ray today We will treat with doxycycline today Sputum cultures today Follow-up in 8 weeks

## 2019-06-08 NOTE — Assessment & Plan Note (Signed)
Known emphysema on CT imaging Not currently on a maintenance inhaler Former smoker  Plan: We will continue to clinically monitor May need to consider trial of maintenance inhaler such as a LAMA LABA at next office visit

## 2019-07-19 DIAGNOSIS — M316 Other giant cell arteritis: Secondary | ICD-10-CM | POA: Diagnosis not present

## 2019-07-19 DIAGNOSIS — H353221 Exudative age-related macular degeneration, left eye, with active choroidal neovascularization: Secondary | ICD-10-CM | POA: Diagnosis not present

## 2019-07-19 DIAGNOSIS — Z961 Presence of intraocular lens: Secondary | ICD-10-CM | POA: Diagnosis not present

## 2019-07-19 DIAGNOSIS — H353213 Exudative age-related macular degeneration, right eye, with inactive scar: Secondary | ICD-10-CM | POA: Diagnosis not present

## 2019-07-20 DIAGNOSIS — Z0000 Encounter for general adult medical examination without abnormal findings: Secondary | ICD-10-CM | POA: Diagnosis not present

## 2019-07-20 DIAGNOSIS — D519 Vitamin B12 deficiency anemia, unspecified: Secondary | ICD-10-CM | POA: Diagnosis not present

## 2019-07-20 DIAGNOSIS — R739 Hyperglycemia, unspecified: Secondary | ICD-10-CM | POA: Diagnosis not present

## 2019-07-20 DIAGNOSIS — E7849 Other hyperlipidemia: Secondary | ICD-10-CM | POA: Diagnosis not present

## 2019-07-20 DIAGNOSIS — M859 Disorder of bone density and structure, unspecified: Secondary | ICD-10-CM | POA: Diagnosis not present

## 2019-07-26 DIAGNOSIS — M316 Other giant cell arteritis: Secondary | ICD-10-CM | POA: Diagnosis not present

## 2019-07-26 DIAGNOSIS — Z7952 Long term (current) use of systemic steroids: Secondary | ICD-10-CM | POA: Diagnosis not present

## 2019-07-26 DIAGNOSIS — M353 Polymyalgia rheumatica: Secondary | ICD-10-CM | POA: Diagnosis not present

## 2019-07-26 DIAGNOSIS — M150 Primary generalized (osteo)arthritis: Secondary | ICD-10-CM | POA: Diagnosis not present

## 2019-07-27 DIAGNOSIS — M315 Giant cell arteritis with polymyalgia rheumatica: Secondary | ICD-10-CM | POA: Diagnosis not present

## 2019-07-27 DIAGNOSIS — G4700 Insomnia, unspecified: Secondary | ICD-10-CM | POA: Diagnosis not present

## 2019-07-27 DIAGNOSIS — I10 Essential (primary) hypertension: Secondary | ICD-10-CM | POA: Diagnosis not present

## 2019-07-27 DIAGNOSIS — E099 Drug or chemical induced diabetes mellitus without complications: Secondary | ICD-10-CM | POA: Diagnosis not present

## 2019-07-27 DIAGNOSIS — Z0000 Encounter for general adult medical examination without abnormal findings: Secondary | ICD-10-CM | POA: Diagnosis not present

## 2019-07-27 DIAGNOSIS — R82998 Other abnormal findings in urine: Secondary | ICD-10-CM | POA: Diagnosis not present

## 2019-07-27 DIAGNOSIS — Z1331 Encounter for screening for depression: Secondary | ICD-10-CM | POA: Diagnosis not present

## 2019-07-30 ENCOUNTER — Encounter: Payer: Self-pay | Admitting: Acute Care

## 2019-07-30 ENCOUNTER — Ambulatory Visit (INDEPENDENT_AMBULATORY_CARE_PROVIDER_SITE_OTHER): Payer: Medicare Other | Admitting: Acute Care

## 2019-07-30 DIAGNOSIS — M353 Polymyalgia rheumatica: Secondary | ICD-10-CM | POA: Diagnosis not present

## 2019-07-30 DIAGNOSIS — J440 Chronic obstructive pulmonary disease with acute lower respiratory infection: Secondary | ICD-10-CM | POA: Diagnosis not present

## 2019-07-30 DIAGNOSIS — J479 Bronchiectasis, uncomplicated: Secondary | ICD-10-CM

## 2019-07-30 DIAGNOSIS — I776 Arteritis, unspecified: Secondary | ICD-10-CM | POA: Diagnosis not present

## 2019-07-30 DIAGNOSIS — J209 Acute bronchitis, unspecified: Secondary | ICD-10-CM

## 2019-07-30 MED ORDER — ALBUTEROL SULFATE (2.5 MG/3ML) 0.083% IN NEBU: 2.5000 mg | INHALATION_SOLUTION | Freq: Four times a day (QID) | RESPIRATORY_TRACT | 12 refills | Status: DC | PRN

## 2019-07-30 MED ORDER — ALBUTEROL SULFATE HFA 108 (90 BASE) MCG/ACT IN AERS: 2.0000 | INHALATION_SPRAY | Freq: Four times a day (QID) | RESPIRATORY_TRACT | 2 refills | Status: DC | PRN

## 2019-07-30 MED ORDER — DOXYCYCLINE HYCLATE 100 MG PO TABS: 100.0000 mg | ORAL_TABLET | Freq: Two times a day (BID) | ORAL | 0 refills | Status: DC

## 2019-07-30 NOTE — Progress Notes (Signed)
Virtual Visit via Telephone Note  I connected with Heidi Hobbs on 07/30/19 at 11:00 AM EDT by telephone and verified that I am speaking with the correct person using two identifiers.  Location: Patient: At home Provider: Working virtually from home   I discussed the limitations, risks, security and privacy concerns of performing an evaluation and management service by telephone and the availability of in person appointments. I also discussed with the patient that there may be a patient responsible charge related to this service. The patient expressed understanding and agreed to proceed.  79 year old female former smoker ( Quit 2019 with a 25 pack year smoking history) followed by Dr. Melvyn Novas  in our office for COPD, cough, bronchiectasis.Patient also is followed by rheumatology Dr. Trudie Hobbs for polymyalgia rheumatica and vasculitis.  Patient is maintained on prednisone for management of this.  PMH: CAD, hypertension, vasculitis, Smoker/ Smoking History: Former smoker Maintenance:  None  Pt of: [name]  History of Present Illness: Pt. Presents for follow up. She was last seen in the office by Wyn Quaker, NP 06/08/2019 with complaints of 4-6 weeks of an increase in cough and getting short of breath. She was treated at that time with Doxycycline. She states she got better after the Doxycycline, but she did not follow up with sputum cultures.   She went to rheumatologist ( Dr. Trudie Hobbs) this past  Thursday>> CXR at that time was negative for pneumonia per patient report. We do not have access to the images or the results. She was then seen by  her family physician Dr. Virgina Jock Friday, and he did hear rattling in her chest at that time. She stated she did not feel as bad when she saw him Friday  as she did  this weekend, and today. . She is complaining of some shortness of breath. She states she has used her albuterol  inhaler about 3 times this weekend. Her cough is worse and she has thick  stringy yellow secretions.She has post nasal drip that is causing her cough jags.  She states she has been using her mucinex and her flutter valve. She has been on prednisone 80 mg daily for new diagnosis of PMR Giant Cell vasculitis . She states this was diagnosed 3 weeks ago, an this is why her rheumatologist has increased her prednisone to 80 mg daily. ( 40 mg in the morning and 40 mg at night. She is not sleeping well. . She has post nasal drip at present, and this is triggering her cough. She does have a nebulizer machine. We will order albuterol nebs to get her through this acute exacerbation.  I did extensive teaching regarding both the nebs and the inhaler are albuterol, and she can use one or the other, but not both at once. She verbalized understanding.  Observations/Objective: 12/20/2018-Rh factor-negative 12/20/2018-ANA-negative 12/20/2018-sed rate-22 1014/2020-CBC with differential-eosinophils absolute 0.1, eosinophils relative 1 11/27/2018-chest x-ray-emphysema, no acute disease  08/31/2017-pulmonary function test-FVC 2.95 (95% predicted), postbronchodilator ratio 68, postbronchodilator FEV1 2.23 (95% predicted) no bronchodilator response, mid flow reversibility, DLCO 16.75 (57% predicted)  10/14/2017-CT chest high-res-moderate centrilobular emphysema and diffuse bronchial wall thickening, no acute consolidative airspace disease or lung masses, scattered mild cylindrical varicoid bronchiectasis throughout both lungs predominantly in bilateral upper lobes and right middle lobe with the associated mild patchy tree-in-bud opacities and scattered mucoid impaction, 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, moderate centrilobular emphysema, patchy lobular air trapping in both lungs indicative of small airways disease,  01/03/2018-CT maxillofacial-clear paranasal sinuses   FENO:  Recent Labs  No results found for: NITRICOXIDE     PFT: PFT Results Latest Ref Rng & Units 08/31/2017  FVC-Pre L 2.95  FVC-Predicted Pre % 95  FVC-Post L 3.27  FVC-Predicted Post % 105  Pre FEV1/FVC % % 71  Post FEV1/FCV % % 68  FEV1-Pre L 2.09  FEV1-Predicted Pre % 90  FEV1-Post L 2.23  DLCO UNC% % 57  DLCO COR %Predicted % 66  TLC L 6.53  TLC % Predicted % 117  RV % Predicted % 136     Assessment and Plan: COPD GOLD 1 Emphysema Plan We will send in a prescription for doxycycline 100 mg twice daily x 7 days. Take until gone. Take probiotic with antibiotic We will do a trial of a LABA/LAMA maintenance inhaler once you hare improved from this current flare  , as I think this wil help with your shortness of breath. We will also send in a prescription to renew your Albuterol inhaler  2 puffs  As needed up to every 6 hours for shortness of breath or wheezing. We will send in some albuterol nebulizer treatments for use in your nebulizer machine. Use as needed for shortness of breath or wheezing up to 3 times daily. Remember the nebulizer and inhaler are the same medication, so do not double dose  Cough Post nasal drip Plan At the drug store please pick up some Delsym for cough, Claritin or Zyrtec for post nasal drip, and Flonase sinus spray. ( Generic ok)  Sips of water instead of throat clearing Sugar Free [employer] or [employer] originals for throat soothing. Delsym Cough syrup 5 cc's every 12 hours Non-sedating antihistamine of your choice daily ( Zyrtec, Allegra, Xyzol, Claritin ( Generic ok) Use the Flonase nasal spray once daily, 2 sprays in each nostril    Bronchiectasis Continue  Do Mucinex once daily x 1 week in the morning , then resume twice daily Continue flutter valve 2-3 times daily, 4 puffs each time.  Call the office in 2-3 days if you are not feeling better, as we will need to see you in the office to better assess you. . We will need to do sputum cultures once you are better and have been off  antibiotics for 4-6 weeks.  Vasculitis Continue follow-up with [facility] Continue follow-up with rheumatology  Polymyalgia Rheumatica Plan Continue follow-up with rheumatology Continue prednisone per Rheum   Follow Up Instructions: Follow up tele visit  With Gwyn Hieronymus NP, 5/24/at 11 am to ensure you are getting better.  We will schedule an OV with Dr. Melvyn Novas after this tele visit follow up.     I discussed the assessment and treatment plan with the patient. The patient was provided an opportunity to ask questions and all were answered. The patient agreed with the plan and demonstrated an understanding of the instructions.   The patient was advised to call back or seek an in-person evaluation if the symptoms worsen or if the condition fails to improve as anticipated.  I provided 55 minutes of non-face-to-face time during this encounter.   Magdalen Spatz, NP 07/30/2019

## 2019-07-30 NOTE — Patient Instructions (Addendum)
We will send in a prescription for doxycycline 100 mg twice daily x 7 days. Take until gone. Take probiotic with antibiotic We will also send in a prescription to renew your Albuterol inhaler  2 puffs  As needed up to every 6 hours for shortness of breath or wheezing. We will send in some albuterol nebulizer treatments for use in your nebulizer machine. Use as needed for shortness of breath or wheezing up to 3 times daily. Remember the nebulizer and inhaler are the same medication, so do not double dose At the drug store please pick up some Delsym for cough, Claritin or Zyrtec for post nasal drip, and Flonase sinus spray. ( Generic ok)  Sips of water instead of throat clearing Sugar Free [employer] or [employer] originals for throat soothing. Delsym Cough syrup 5 cc's every 12 hours Non-sedating antihistamine of your choice daily ( Zyrtec, Allegra, Xyzol, Claritin ( Generic ok) Use the Flonase nasal spray once daily, 2 sprays in each nostril Do Mucinex once daily x 1 week in the morning , then resume twice daily Continue flutter valve 2-3 times daily, 4 puffs each time.  Call the office in 2-3 days if you are not feeling better, as we will need to see you in the office to better assess you. . We will do a trial of a LABA/LAMA maintenance inhaler once you hare improved from this flare, as I think this will help with your shortness of breath.   Follow up tele visit  With Amaris Delafuente NP, 5/24/at 11 am to ensure you are getting better.  We will schedule an OV with Dr. Melvyn Novas after this tele visit follow up.  Please contact office for sooner follow up if symptoms do not improve or worsen or seek emergency care

## 2019-08-01 ENCOUNTER — Telehealth: Payer: Self-pay | Admitting: Internal Medicine

## 2019-08-01 NOTE — Telephone Encounter (Signed)
Yes, she needs to go to urgent care of ED, but should still be scheduled next week 5/25 to be assessed and evaluated for improvement. Thanks

## 2019-08-01 NOTE — Telephone Encounter (Signed)
There are no appts with any provider until next Tues 5/25 Should we tell her to go to [facility] or ED?

## 2019-08-01 NOTE — Telephone Encounter (Signed)
She needs to be seen in the office with a  CXR prior. I told her we could only do so much virtually, and that if she was not imptoving she needed to be seen and assessed in the office. Please schedule her for an OV tomorrow, or if there is a doc who can do this today as an add on. Thanks

## 2019-08-01 NOTE — Telephone Encounter (Signed)
Spoke with Tammy and notified of recs per SG  She will take pt to UC or ED for eval now  Appt for f/u with MW scheduled for 08/09/19

## 2019-08-01 NOTE — Telephone Encounter (Signed)
Called and spoke with daughter in law and she states that patient is not feeling any better since starting medications given on her Televisit on 5/17. Please advise with any other recommendations     We will send in a prescription for doxycycline 100 mg twice daily x 7 days. Take until gone. Take probiotic with antibiotic We will also send in a prescription to renew your Albuterol inhaler  2 puffs  As needed up to every 6 hours for shortness of breath or wheezing. We will send in some albuterol nebulizer treatments for use in your nebulizer machine. Use as needed for shortness of breath or wheezing up to 3 times daily. Remember the nebulizer and inhaler are the same medication, so do not double dose At the drug store please pick up some Delsym for cough, Claritin or Zyrtec for post nasal drip, and Flonase sinus spray. ( Generic ok)  Sips of water instead of throat clearing Sugar Free [employer] or [employer] originals for throat soothing. Delsym Cough syrup 5 cc's every 12 hours Non-sedating antihistamine of your choice daily ( Zyrtec, Allegra, Xyzol, Claritin ( Generic ok) Use the Flonase nasal spray once daily, 2 sprays in each nostril Do Mucinex once daily x 1 week in the morning , then resume twice daily Continue flutter valve 2-3 times daily, 4 puffs each time.  Call the office in 2-3 days if you are not feeling better, as we will need to see you in the office to better assess you. . We will do a trial of a LABA/LAMA maintenance inhaler once you hare improved from this flare, as I think this will help with your shortness of breath.   Follow up tele visit  With Sarah NP, 5/24/at 11 am to ensure you are getting better.  We will schedule an OV with Dr. Melvyn Novas after this tele visit follow up.  Please contact office for sooner follow up if symptoms do not improve or worsen or seek emergency care

## 2019-08-02 ENCOUNTER — Telehealth: Payer: Self-pay | Admitting: Acute Care

## 2019-08-02 NOTE — Telephone Encounter (Signed)
Spoke with Tammy and she is asking if pt should keep appt with Dr wert on 5.27 or see SG on 5.24  She states pt still coughing despite abx and pred  I advised keep the sooner appt and will leave the MW appt just in case he still needs to see her  Recommendation from yesterday to go to [facility] or ED for eval still stands and she verbalized understanding of this.

## 2019-08-06 ENCOUNTER — Emergency Department (HOSPITAL_BASED_OUTPATIENT_CLINIC_OR_DEPARTMENT_OTHER): Payer: Medicare Other

## 2019-08-06 ENCOUNTER — Encounter (HOSPITAL_BASED_OUTPATIENT_CLINIC_OR_DEPARTMENT_OTHER): Payer: Self-pay | Admitting: Emergency Medicine

## 2019-08-06 ENCOUNTER — Encounter: Payer: Self-pay | Admitting: Acute Care

## 2019-08-06 ENCOUNTER — Other Ambulatory Visit: Payer: Self-pay

## 2019-08-06 ENCOUNTER — Telehealth: Payer: Self-pay | Admitting: Internal Medicine

## 2019-08-06 ENCOUNTER — Ambulatory Visit (INDEPENDENT_AMBULATORY_CARE_PROVIDER_SITE_OTHER): Payer: Medicare Other | Admitting: Acute Care

## 2019-08-06 ENCOUNTER — Emergency Department (HOSPITAL_BASED_OUTPATIENT_CLINIC_OR_DEPARTMENT_OTHER)
Admission: EM | Admit: 2019-08-06 | Discharge: 2019-08-06 | Disposition: A | Discharge status: Home / Self Care | Payer: Medicare Other | Attending: Emergency Medicine | Admitting: Emergency Medicine

## 2019-08-06 DIAGNOSIS — Z87891 Personal history of nicotine dependence: Secondary | ICD-10-CM | POA: Insufficient documentation

## 2019-08-06 DIAGNOSIS — J449 Chronic obstructive pulmonary disease, unspecified: Secondary | ICD-10-CM | POA: Diagnosis not present

## 2019-08-06 DIAGNOSIS — R0602 Shortness of breath: Secondary | ICD-10-CM | POA: Diagnosis not present

## 2019-08-06 DIAGNOSIS — I10 Essential (primary) hypertension: Secondary | ICD-10-CM | POA: Insufficient documentation

## 2019-08-06 DIAGNOSIS — Z91012 Allergy to eggs: Secondary | ICD-10-CM | POA: Insufficient documentation

## 2019-08-06 DIAGNOSIS — M316 Other giant cell arteritis: Secondary | ICD-10-CM | POA: Diagnosis not present

## 2019-08-06 DIAGNOSIS — E785 Hyperlipidemia, unspecified: Secondary | ICD-10-CM | POA: Insufficient documentation

## 2019-08-06 DIAGNOSIS — T380X5A Adverse effect of glucocorticoids and synthetic analogues, initial encounter: Secondary | ICD-10-CM

## 2019-08-06 DIAGNOSIS — Z8541 Personal history of malignant neoplasm of cervix uteri: Secondary | ICD-10-CM | POA: Insufficient documentation

## 2019-08-06 DIAGNOSIS — Z79899 Other long term (current) drug therapy: Secondary | ICD-10-CM | POA: Insufficient documentation

## 2019-08-06 DIAGNOSIS — R0600 Dyspnea, unspecified: Secondary | ICD-10-CM

## 2019-08-06 DIAGNOSIS — R0609 Other forms of dyspnea: Secondary | ICD-10-CM | POA: Diagnosis not present

## 2019-08-06 DIAGNOSIS — R519 Headache, unspecified: Secondary | ICD-10-CM | POA: Diagnosis not present

## 2019-08-06 HISTORY — DX: Bronchiectasis, uncomplicated: J47.9

## 2019-08-06 LAB — CBC WITH DIFFERENTIAL/PLATELET
Abs Immature Granulocytes: 0.4 10*3/uL — ABNORMAL HIGH (ref 0.00–0.07)
Basophils Absolute: 0.1 10*3/uL (ref 0.0–0.1)
Basophils Relative: 0 %
Eosinophils Absolute: 0 10*3/uL (ref 0.0–0.5)
Eosinophils Relative: 0 %
HCT: 45.7 % (ref 36.0–46.0)
Hemoglobin: 15.5 g/dL — ABNORMAL HIGH (ref 12.0–15.0)
Immature Granulocytes: 2 %
Lymphocytes Relative: 7 %
Lymphs Abs: 1.4 10*3/uL (ref 0.7–4.0)
MCH: 30.4 pg (ref 26.0–34.0)
MCHC: 33.9 g/dL (ref 30.0–36.0)
MCV: 89.6 fL (ref 80.0–100.0)
Monocytes Absolute: 0.8 10*3/uL (ref 0.1–1.0)
Monocytes Relative: 4 %
Neutro Abs: 16.3 10*3/uL — ABNORMAL HIGH (ref 1.7–7.7)
Neutrophils Relative %: 87 %
Platelets: 260 10*3/uL (ref 150–400)
RBC: 5.1 MIL/uL (ref 3.87–5.11)
RDW: 16.3 % — ABNORMAL HIGH (ref 11.5–15.5)
WBC: 18.9 10*3/uL — ABNORMAL HIGH (ref 4.0–10.5)
nRBC: 0 % (ref 0.0–0.2)

## 2019-08-06 LAB — BASIC METABOLIC PANEL
Anion gap: 14 (ref 5–15)
BUN: 16 mg/dL (ref 8–23)
CO2: 25 mmol/L (ref 22–32)
Calcium: 8.6 mg/dL — ABNORMAL LOW (ref 8.9–10.3)
Chloride: 94 mmol/L — ABNORMAL LOW (ref 98–111)
Creatinine, Ser: 0.77 mg/dL (ref 0.44–1.00)
GFR calc Af Amer: >60 mL/min (ref >60)
GFR calc non Af Amer: >60 mL/min (ref >60)
Glucose, Bld: 238 mg/dL — ABNORMAL HIGH (ref 70–99)
Potassium: 4 mmol/L (ref 3.5–5.1)
Sodium: 133 mmol/L — ABNORMAL LOW (ref 135–145)

## 2019-08-06 LAB — BRAIN NATRIURETIC PEPTIDE: B Natriuretic Peptide: 72.6 pg/mL (ref 0.0–100.0)

## 2019-08-06 MED ORDER — ALBUTEROL SULFATE (2.5 MG/3ML) 0.083% IN NEBU
2.5000 mg | INHALATION_SOLUTION | Freq: Once | RESPIRATORY_TRACT | Status: AC
  Administered 2019-08-06: 2.5 mg via RESPIRATORY_TRACT
  Filled 2019-08-06: qty 3

## 2019-08-06 MED ORDER — IPRATROPIUM-ALBUTEROL 0.5-2.5 (3) MG/3ML IN SOLN
3.0000 mL | Freq: Once | RESPIRATORY_TRACT | Status: AC
  Administered 2019-08-06: 3 mL via RESPIRATORY_TRACT
  Filled 2019-08-06: qty 3

## 2019-08-06 NOTE — Telephone Encounter (Signed)
Patient has been sent to the [facility] in [place] per televisit with Judson Roch NP Will sign off

## 2019-08-06 NOTE — Telephone Encounter (Signed)
Patient is scheduled for televisit with Heidi Hobbs for today at 12 - appt time is now.  Patient is also scheduled to see MW on this coming Thursday 5.27.21  ATC patient but there was no answer, televisit has likely already begun.

## 2019-08-06 NOTE — ED Triage Notes (Signed)
Pt having sob with cough x 2 weeks.  Pt on prednisone recently for temporal arteritis.  No fever but evening sweats.

## 2019-08-06 NOTE — ED Provider Notes (Signed)
[facility] EMERGENCY DEPARTMENT Provider Note   CSN: [id number] Arrival date & time: 08/06/19  1228     History Chief Complaint  Patient presents with  . Shortness of Breath    Heidi Hobbs is a 79 y.o. female.  HPI   22yF presenting with daughter with several complaints. Over the past several weeks she has had no energy. Very fatigued with little activity. She is normally very active for her age but over the past several weeks hasn't been able to maintain her normal level of activity. Chronic lung disease but has felt more short of breath than normal. Gaining weight and having swelling around shoulder and abdomen. She has been on high doses of steroids with diagnosis of PMR and even high more recently with possible giant cell arteritis. She is to see surgery today to discuss biopsy.    Past Medical History:  Diagnosis Date  . Adenomatous colon polyp 1995, 2011  . Anemia   . Anxiety   . Bronchiectasis ([place])   . Cancer [facility])    uterine cancer  . Carotid stenosis    40-59% right, less than 40% left  . Cataracts, bilateral   . COPD (chronic obstructive pulmonary disease) ([place])   . Essential hypertension 02/13/2016  . Hyperlipidemia   . Hypertension   . MAC (mycobacterium avium-intracellulare complex)   . Macular degeneration, dry 02/13/2016  . Polio 1959  . Stomach ulcer 1960  . Urinary tract infection   . Vasculitis ([place])    Normocomplementemic urticarial   . Vasculitis ([place]) 02/13/2016   Followed by [facility]    Patient Active Problem List   Diagnosis Date Noted  . Polymyalgia rheumatica ([place]) 06/08/2019  . Gastroesophageal reflux disease 06/08/2019  . Impingement syndrome of right shoulder 03/23/2019  . S/P cervical spinal fusion 12/05/2018  . Spinal stenosis of cervical region 11/22/2018  . Dysuria 04/08/2018  . Former smoker 07/24/2017  . Venous stasis ulcer ([place]) 05/25/2017  . Atherosclerosis of native arteries of extremity with  intermittent claudication ([place]) 05/25/2017  . Occlusion and stenosis of vertebral artery 05/14/2016  . Aortic arch atherosclerosis ([place]) 05/14/2016  . Essential hypertension 02/13/2016  . Vasculitis ([place]) 02/13/2016  . Macular degeneration, dry 02/13/2016  . Acute appendicitis with perforation and peritoneal abscess 01/23/2016  . Carotid artery disease ([place]) 07/31/2015  . Bronchiectasis without acute exacerbation ([place]) 08/18/2012  . Cough 08/18/2012  . COPD GOLD 1 08/17/2012   Past Surgical History:  Procedure Laterality Date  . ANTERIOR CERVICAL DECOMP/DISCECTOMY FUSION N/A 11/27/2018   Procedure: CERVICAL FIVE TO SIX , CERVICAL SIX TO SEVEN, ANTERIOR CERVICAL DECOMPRESSION/DISCECTOMY FUSION, ALLOGRAFT, PLATE;  Surgeon: [name], MD;  Location: [facility];  Service: Orthopedics;  Laterality: N/A;  . APPENDECTOMY     rupture appendix  . BREAST ENHANCEMENT SURGERY     removal  . CATARACT EXTRACTION    . COLONOSCOPY    . COLONOSCOPY  10/14/09  . LAPAROSCOPIC APPENDECTOMY N/A 01/23/2016   Procedure: APPENDECTOMY LAPAROSCOPIC;  Surgeon: Erroll Luna, MD;  Location: [facility];  Service: General;  Laterality: N/A;  . POLYPECTOMY  2011   x8  . TONSILLECTOMY AND ADENOIDECTOMY    . VAGINAL HYSTERECTOMY  1970   uterine cancer    OB History   No obstetric history on file.    Family History  Problem Relation Age of Onset  . Colon cancer Father   . Esophageal cancer Brother   . Esophageal cancer Brother   . Asthma  Daughter        as a child  . Stomach cancer Neg Hx   . Rectal cancer Neg Hx    Social History   Tobacco Use  . Smoking status: Former Smoker    Packs/day: 0.50    Years: 50.00    Pack years: 25.00    Types: Cigarettes    Quit date: 08/01/2017    Years since quitting: 2.0  . Smokeless tobacco: Never Used  Substance Use Topics  . Alcohol use: No    Alcohol/week: 0.0 standard drinks  . Drug use: No   Home Medications Prior to Admission medications   Medication Sig  Start Date End Date Taking? Authorizing Provider  albuterol (PROVENTIL HFA;VENTOLIN HFA) 108 (90 Base) MCG/ACT inhaler Inhale 1 puff into the lungs every 6 (six) hours as needed for wheezing or shortness of breath. Patient taking differently: Inhale 2 puffs into the lungs every 6 (six) hours as needed for wheezing or shortness of breath.  04/07/18   Tanda Rockers, MD  albuterol (PROVENTIL) (2.5 MG/3ML) 0.083% nebulizer solution Take 3 mLs (2.5 mg total) by nebulization every 6 (six) hours as needed for wheezing or shortness of breath. 07/30/19   Magdalen Spatz, NP  albuterol (VENTOLIN HFA) 108 (90 Base) MCG/ACT inhaler Inhale 2 puffs into the lungs every 6 (six) hours as needed for wheezing or shortness of breath. 07/30/19   Magdalen Spatz, NP  ALPRAZolam Duanne Moron) 0.25 MG tablet Take 0.25 tablets by mouth daily as needed for anxiety.  07/22/17   [provider]  amitriptyline (ELAVIL) 100 MG tablet Take 100 mg by mouth at bedtime.     [provider]  amLODipine (NORVASC) 5 MG tablet Take 5 mg by mouth daily.     [provider]  atenolol (TENORMIN) 50 MG tablet Take 50 mg by mouth daily.    [provider]  atorvastatin (LIPITOR) 40 MG tablet Take 40 mg by mouth every other day. At night    [provider]  cholecalciferol (VITAMIN D3) 25 MCG (1000 UT) tablet Take 1,000 Units by mouth daily.    [provider]  diclofenac sodium (VOLTAREN) 1 % GEL Apply 4 g topically 4 (four) times daily. 12/06/18   [name], MD  docusate sodium (COLACE) 100 MG capsule You can buy this at any drug store, over the counter and follow package instructions Patient taking differently: Take 100 mg by mouth daily.  02/01/16   Earnstine Regal, PA-C  doxycycline (VIBRA-TABS) 100 MG tablet Take 1 tablet (100 mg total) by mouth 2 (two) times daily. 07/30/19   Magdalen Spatz, NP  EPINEPHrine 0.3 mg/0.3 mL IJ SOAJ injection Inject 0.3 mLs into the muscle once as needed for  anaphylaxis.     [provider]  ezetimibe (ZETIA) 10 MG tablet Take 10 mg by mouth at bedtime.     [provider]  famotidine (PEPCID) 20 MG tablet Take 1 tablet (20 mg total) by mouth daily. 06/08/19   [name], NP  hydrochlorothiazide (HYDRODIURIL) 25 MG tablet Take 25 mg by mouth daily.    [provider]  losartan (COZAAR) 100 MG tablet Take 100 mg by mouth daily.    [provider]  omeprazole (PRILOSEC OTC) 20 MG tablet Take 1 tablet (20 mg total) by mouth at bedtime. 06/08/19   [name], NP  predniSONE (DELTASONE) 5 MG tablet Take 10 mg by mouth daily. 05/27/19   [provider]  Probiotic CAPS Take 1 capsule by mouth daily.    [provider]  Respiratory Therapy Supplies (FLUTTER) DEVI 1 Device by Does not apply route 2 (two) times daily. 06/08/19   [name], NP  traMADol Veatrice Bourbon) 50 MG tablet SMARTSIG:1 Tablet(s) By Mouth Every 12 Hours PRN 02/27/19   [provider]  vitamin B-12 (CYANOCOBALAMIN) 500 MCG tablet Take 500 mcg by mouth daily.    [provider]   Allergies    Eggs or egg-derived products  Review of Systems   Review of Systems   All systems reviewed and negative, other than as noted in HPI.  Physical Exam Updated Vital Signs BP 123/67 (BP Location: Right Arm)   Pulse 98   Temp 98.5 F (36.9 C) (Oral)   Resp 20   Ht _0  (1.727 m)   Wt 72.6 kg   SpO2 93%   BMI 24.33 kg/m   Physical Exam Vitals and nursing note reviewed.  Constitutional:      General: She is not in acute distress.    Appearance: She is well-developed.  HENT:     Head: Normocephalic and atraumatic.  Eyes:     General:        Right eye: No discharge.        Left eye: No discharge.     Conjunctiva/sclera: Conjunctivae normal.  Cardiovascular:     Rate and Rhythm: Normal rate and regular rhythm.     Heart sounds: Normal heart sounds. No murmur. No friction rub. No gallop.   Pulmonary:     Effort:  Pulmonary effort is normal. No respiratory distress.     Breath sounds: Wheezing present.  Abdominal:     General: There is no distension.     Palpations: Abdomen is soft.     Tenderness: There is no abdominal tenderness.  Musculoskeletal:        General: No tenderness.     Cervical back: Neck supple.  Skin:    General: Skin is warm and dry.  Neurological:     Mental Status: She is alert.  Psychiatric:        Behavior: Behavior normal.        Thought Content: Thought content normal.    ED Results / Procedures / Treatments   Labs (all labs ordered are listed, but only abnormal results are displayed) Labs Reviewed  CBC WITH DIFFERENTIAL/PLATELET - Abnormal; Notable for the following components:      Result Value   WBC 18.9 (*)    Hemoglobin 15.5 (*)    RDW 16.3 (*)    Neutro Abs 16.3 (*)    Abs Immature Granulocytes 0.40 (*)    All other components within normal limits  BASIC METABOLIC PANEL - Abnormal; Notable for the following components:   Sodium 133 (*)    Chloride 94 (*)    Glucose, Bld 238 (*)    Calcium 8.6 (*)    All other components within normal limits  BRAIN NATRIURETIC PEPTIDE   EKG None  Radiology DG Chest 2 View  Result Date: 08/06/2019 CLINICAL DATA:  Shortness of breath EXAM: CHEST - 2 VIEW COMPARISON:  June 08, 2019 FINDINGS: There is atelectatic change in the lung bases. The lungs otherwise are clear. Heart size and pulmonary vascularity are normal. There is aortic atherosclerosis. No adenopathy. There is postoperative change in the lower cervical region. IMPRESSION: Bibasilar atelectasis. Lungs otherwise clear. Cardiac silhouette normal. Aortic Atherosclerosis (ICD10-I70.0). Electronically Signed   By:  Lowella Grip III M.D.   On: 08/06/2019 13:55    Procedures Procedures (including critical care time)  Medications Ordered in ED Medications  ipratropium-albuterol (DUONEB) 0.5-2.5 (3) MG/3ML nebulizer solution 3 mL (3 mLs Nebulization Given  08/06/19 1306)  albuterol (PROVENTIL) (2.5 MG/3ML) 0.083% nebulizer solution 2.5 mg (2.5 mg Nebulization Given 08/06/19 1306)    ED Course  I have reviewed the triage vital signs and the nursing notes.  Pertinent labs & imaging results that were available during my care of the patient were reviewed by me and considered in my medical decision making (see chart for details).    MDM Rules/Calculators/A&P                      78yF with numerous complaints. I suspect a lot of her symptoms have to do with the massive amounts of steroids she has been on. She is starting to develop a cushingoid appearance.   I think an acute pulmonary issue is a lot less likely. Some wheezing on exam today but didn't take her morning treatment today. She is not distressed. O2 sats adequate on RA. CXR w/o acute findings. I doubt PE, atypical ACS or other emergent issue. It would be nice if she could obtain the temporal artery biopsy and hopefully be able to decrease the amount of steroids she is taking. Is is obviously at the discretion of her primary doctors though.   Final Clinical Impression(s) / ED Diagnoses Final diagnoses:  Dyspnea, unspecified type  Adverse effect of corticosteroids, initial encounter    Rx / DC Orders ED Discharge Orders    None       Virgel Manifold, MD 08/06/19 1457

## 2019-08-06 NOTE — Patient Instructions (Signed)
Please go to [facility] for acute assessment of shortness of breath. Follow up appointment with Dr. Melvyn Novas at 2:30 pm 5/27 ( Thursday) Call if you need to be seen sooner.  Please contact office for sooner follow up if symptoms do not improve or worsen or seek emergency care

## 2019-08-06 NOTE — Discharge Instructions (Addendum)
I suspect a lot of your symptoms are related to the high doses of steroids you have been taking. They are indicated in your situation but unfortunately can have many side effects. Your chest x-ray doesn't show anything acute today. Your symptoms are not from heart failure. Follow-up with your pulmonologist to discuss further.

## 2019-08-06 NOTE — Progress Notes (Signed)
Virtual Visit via Telephone Note  I connected with Heidi Hobbs on 08/06/19 at 11:00 AM EDT by telephone and verified that I am speaking with the correct person using two identifiers.  Location: Patient: At Home Provider: Working remotely from home   I discussed the limitations, risks, security and privacy concerns of performing an evaluation and management service by telephone and the availability of in person appointments. I also discussed with the patient that there may be a patient responsible charge related to this service. The patient expressed understanding and agreed to proceed.   History of Present Illness: Pt. Daughter presents with patient  for follow up. She was treated for a COPD flare 07/30/2019 with Doxycycline and additional neb treatments. . She was having some shortness of breath and what she described as rattling in her chest.  . She had a CXR 5/14 at her PCP's office as she was short of breath which was clear per her PCP.  She has been on very high dose prednisone for suspected giant cell arteritis. She was on 80 mg daily, which was only recently decreased to 50 mg.She has been on this since January 2021. She completed the dose of doxycycline, but has not had any significant improvement in her breathing despite additional use of inhaler and nebs.. Per her daughter she has had significant fluid retention, and she has sores in her mouth from the prednisone.I believe She needs another CXR and labwork for further evaluation.. Today her daughter  called the office requesting an OV instead of a tele visit.She is wheezing and she has a cough.Secretions are clear but slimy per daughter. No fever.  She has not been using her flutter valve as it makes her cough and worsens her shortness of breath.Sats range from 91-94% per her daughter. The earliest we could see her in the office  Today was 3:15, and she had an appointment with a surgeon for consult for biopsy of temporal artery for giant  cell arteritis at 3:50 which she wants to make.. I have sent her to [place] to be seen urgently.Per daughter patient does not want to go to the ED, but daughter is going to take her to [place].   Observations/Objective: 12/20/2018-Rh factor-negative 12/20/2018-ANA-negative 12/20/2018-sed rate-22 1014/2020-CBC with differential-eosinophils absolute 0.1, eosinophils relative 1 11/27/2018-chest x-ray-emphysema, no acute disease  08/31/2017-pulmonary function test-FVC 2.95 (95% predicted), postbronchodilator ratio 68, postbronchodilator FEV1 2.23 (95% predicted) no bronchodilator response, mid flow reversibility, DLCO 16.75 (57% predicted)  10/14/2017-CT chest high-res-moderate centrilobular emphysema and diffuse bronchial wall thickening, no acute consolidative airspace disease or lung masses, scattered mild cylindrical varicoid bronchiectasis throughout both lungs predominantly in bilateral upper lobes and right middle lobe with the associated mild patchy tree-in-bud opacities and scattered mucoid impaction, 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 Melvyn Novas 5/27 Continue current maintenance regimen  Follow Up Instructions: 08/09/2019 at 2:30 pm with Dr. Melvyn Novas   I discussed the assessment and treatment plan with the patient. The patient was provided an opportunity  to ask questions and all were answered. The patient agreed with the plan and  demonstrated an understanding of the instructions.   The patient was advised to call back or seek an in-person evaluation if the symptoms worsen or if the condition fails to improve as anticipated.  I provided 40 minutes of non-face-to-face time during this encounter.   Magdalen Spatz, NP 08/06/2019

## 2019-08-07 ENCOUNTER — Other Ambulatory Visit: Payer: Self-pay | Admitting: Otolaryngology

## 2019-08-09 ENCOUNTER — Other Ambulatory Visit: Payer: Self-pay | Admitting: Internal Medicine

## 2019-08-09 ENCOUNTER — Other Ambulatory Visit: Payer: Self-pay

## 2019-08-09 ENCOUNTER — Ambulatory Visit (INDEPENDENT_AMBULATORY_CARE_PROVIDER_SITE_OTHER): Payer: Medicare Other | Admitting: Internal Medicine

## 2019-08-09 ENCOUNTER — Encounter: Payer: Self-pay | Admitting: Internal Medicine

## 2019-08-09 DIAGNOSIS — B370 Candidal stomatitis: Secondary | ICD-10-CM | POA: Diagnosis not present

## 2019-08-09 DIAGNOSIS — K219 Gastro-esophageal reflux disease without esophagitis: Secondary | ICD-10-CM | POA: Diagnosis not present

## 2019-08-09 DIAGNOSIS — J479 Bronchiectasis, uncomplicated: Secondary | ICD-10-CM

## 2019-08-09 MED ORDER — FAMOTIDINE 20 MG PO TABS: ORAL_TABLET | ORAL | 3 refills | Status: DC

## 2019-08-09 MED ORDER — OMEPRAZOLE MAGNESIUM 20 MG PO TBEC: 20.0000 mg | DELAYED_RELEASE_TABLET | Freq: Every day | ORAL | Status: DC

## 2019-08-09 MED ORDER — CLOTRIMAZOLE 10 MG MT TROC
10.0000 mg | Freq: Every day | OROMUCOSAL | 5 refills | Status: DC
Start: 2019-08-09 — End: 2019-09-18

## 2019-08-09 NOTE — Patient Instructions (Addendum)
Change :  Try prilosec otc 20mg   Take 30-60 min before first meal of the day and Pepcid ac (famotidine) 20 mg one @  Bedtime   6-8 inch bed blocks   For cough mucinex dm up to 1200 mg every 12 hours and use flutter valve as much as possible when coughing     If still coughing, do the above plus add the tramadol 50 mg every 4 hours as needed  For shortness of breath > albuterol neb or spray up to every 4 hours as needed   GERD (REFLUX)  is an extremely common cause of respiratory symptoms just like yours , many times with no obvious heartburn at all.    It can be treated with medication, but also with lifestyle changes including elevation of the head of your bed (ideally with 6 -8inch blocks under the headboard of your bed),  Smoking cessation, avoidance of late meals, excessive alcohol, and avoid fatty foods, chocolate, peppermint, colas, red wine, and acidic juices such as orange juice.  NO MINT OR MENTHOL PRODUCTS SO NO COUGH DROPS  USE SUGARLESS CANDY INSTEAD (Jolley ranchers or Stover's or Life Savers) or even ice chips will also do - the key is to swallow to prevent all throat clearing. NO OIL BASED VITAMINS - use powdered substitutes.  Avoid fish oil when coughing.   For mouth sore > clotrimazole troche 10 mg up to 5 x daily   Please schedule a follow up office visit in 4 weeks, sooner if needed

## 2019-08-09 NOTE — Progress Notes (Signed)
Subjective:   Patient ID: Heidi Hobbs, female    DOB: 1941/03/06     MRN: [id number]   Brief patient profile:  63 yowf quit smoking 07/2017 with  Chronic cough since 1980 limited by sob then breathing worse in April 2014 while being being treated by  Dr Fredderick Phenix  For hives, rx with prn Ventolin but didn't help resting sob and dx with bronchiectasis and GOLD I copd criteria 09/27/12   H/o Exposure to chlorine gas in 1980's did not required hospitalization > w/in  Few years started getting recurrent pna  Stopped taking daily macrodantin around 03/2017  Has immunodef f/u by [facility]   History of Present Illness  08/17/2012 1st pulmonary cc indolent cough x sev years better when stops smoking, not limited by breathing. Cough is mostly clear more p shower, and in ams also but doesn't disturb sleep. rec Mucinex or mucinex dm prn     12/05/2017  f/u ov/Kingdavid Leinbach re: COPD GOLD I/ bronchiectasis  Chief Complaint  Patient presents with  . Follow-up    went to UC approx 4 wks ago and was told she had PNA-txed with pred and doxy. She states her breathing is doing okay. She is having some left side back pain. She rareley uses her albuterol.   Dyspnea:  MMRC2 = can't walk a nl pace on a flat grade s sob but does fine slow and flat  Cough: variable cough yellow and green better doxy with assoc sinus congestion, some worse since stopped doxy as is  assoc L post cp  Sleeping: one pillow bed flat  SABA use: rarely   rec Augmentin 875 mg take one pill twice daily  X 10 days - take at breakfast and supper with large glass of water.  It would help reduce the usual side effects (diarrhea and yeast infections) if you ate cultured yogurt at lunch.  Bronchiectasis =   you have scarring of your bronchial tubes  Whenever you develop cough congestion take mucinex or mucinex dm  1200 mg every 12 hours > these will help keep the mucus loose  sinus CT in 2 weeks ->>> all clear   01/04/2018  f/u ov/Falana Clagg re:  bronchiectasis / better p course of augmentin  Chief Complaint  Patient presents with  . Follow-up    Cough has improved  some.  She occ produces some yellow sputum.  She rarely uses her albuterol inhaler.   Dyspnea:  mmrc2 Cough: first thing in am  Sleeping: 2 pillow /flat bed  SABA use: rarely  02: none  rec For cough / congestion >  mucinex or mucinex dm up to 1200 mg every 12 hours as needed For nasty mucus  >  Augmentin 875 mg take one pill twice daily  X 10 days - take at breakfast and supper with large glass of water.  It would help reduce the usual side effects (diarrhea and yeast infections) if you ate cultured yogurt at lunch.     04/07/2018  f/u ov/Damara Klunder re: bronchiectasis / just started back on macrodantin Chief Complaint  Patient presents with  . Follow-up    Breathing is doing well. She is coughing alot at night- admits to eating alot of chocolate at night.   Dyspnea:  MMRC2 = can't walk a nl pace on a flat grade s sob but does fine slow and flat ht ok  Cough: worse at hs and in am eating lots of kisses at bedside Sleeping: flat bed  SABA use:  none  02: none  rec Cipro 500 mg twice daily with meals x one week and stop the macrodantin  Cough > mucinex 1200 mg every 12 hours as needed and flutter valve as much as possible  6-8 in bed blocks very important - see below   GERD    06/08/19  Flare x 4-6 weeks > eval [name]  rx doxy  07/30/19  Flare rx [name] another doxy  07/17/19 ER cxr > atx bases   08/09/2019  Acute extended  ov/Kennidee Heyne re: still flare of coughing with dx bronchiectasis / ? Giant cell arthritis for bx in  8 days  Chief Complaint  Patient presents with  . Acute Visit    pt is coughing sob, pt coughing up claer mucus  Dyspnea:  Mostly when coughing  Cough: severe 24/7 coughing fits with minimal clear mucus production despite on high dose pred for ? PMR  Taking ppi at hs and h2 in am  Only relief she gets from cough is p takes tramadol but only uses once  or twice a day.    No obvious day to day or daytime variability or assoc  purulent sputum or mucus plugs or hemoptysis or cp or chest tightness, subjective wheeze or overt sinus or hb symptoms.     Also denies any obvious fluctuation of symptoms with weather or environmental changes or other aggravating or alleviating factors except as outlined above   No unusual exposure hx or h/o childhood pna/ asthma or knowledge of premature birth.  Current Allergies, Complete Past Medical History, Past Surgical History, Family History, and Social History were reviewed in [employer] record.  ROS  The following are not active complaints unless bolded Hoarseness, sore throat, dysphagia, dental problems, itching, sneezing,  nasal congestion or discharge of excess mucus or purulent secretions, ear ache,   fever, chills, sweats, unintended wt loss or wt gain, classically pleuritic or exertional cp,  orthopnea pnd or arm/hand swelling  or leg swelling, presyncope, palpitations, abdominal pain, anorexia, nausea, vomiting, diarrhea  or change in bowel habits or change in bladder habits, change in stools or change in urine, dysuria, hematuria,  rash, arthralgias, visual complaints, headache, numbness, weakness or ataxia or problems with walking or coordination/ w/c bound ,  change in mood or  memory.        Current Meds - - NOTE:   Unable to verify as accurately reflecting what pt takes     Medication Sig  . albuterol (PROVENTIL HFA;VENTOLIN HFA) 108 (90 Base) MCG/ACT inhaler Inhale 1 puff into the lungs every 6 (six) hours as needed for wheezing or shortness of breath. (Patient taking differently: Inhale 2 puffs into the lungs every 6 (six) hours as needed for wheezing or shortness of breath. )  . albuterol (PROVENTIL) (2.5 MG/3ML) 0.083% nebulizer solution Take 3 mLs (2.5 mg total) by nebulization every 6 (six) hours as needed for wheezing or shortness of breath.  Marland Kitchen albuterol (VENTOLIN HFA)  108 (90 Base) MCG/ACT inhaler Inhale 2 puffs into the lungs every 6 (six) hours as needed for wheezing or shortness of breath.  . ALPRAZolam (XANAX) 0.25 MG tablet Take 0.25 tablets by mouth daily as needed for anxiety.   Marland Kitchen amitriptyline (ELAVIL) 100 MG tablet Take 100 mg by mouth at bedtime.   Marland Kitchen amLODipine (NORVASC) 5 MG tablet Take 5 mg by mouth daily.   Marland Kitchen atenolol (TENORMIN) 50 MG tablet Take 50 mg by mouth daily.  Marland Kitchen atorvastatin (LIPITOR) 40  MG tablet Take 40 mg by mouth every other day. At night  . cholecalciferol (VITAMIN D3) 25 MCG (1000 UT) tablet Take 1,000 Units by mouth daily.  Marland Kitchen docusate sodium (COLACE) 100 MG capsule You can buy this at any drug store, over the counter and follow package instructions (Patient taking differently: Take 100 mg by mouth daily. )  . EPINEPHrine 0.3 mg/0.3 mL IJ SOAJ injection Inject 0.3 mLs into the muscle once as needed for anaphylaxis.   Marland Kitchen ezetimibe (ZETIA) 10 MG tablet Take 10 mg by mouth at bedtime.   . famotidine (PEPCID) 20 MG tablet Take 1 tablet (20 mg total) by mouth daily.  . hydrochlorothiazide (HYDRODIURIL) 25 MG tablet Take 25 mg by mouth daily.  Marland Kitchen losartan (COZAAR) 100 MG tablet Take 100 mg by mouth daily.  Marland Kitchen omeprazole (PRILOSEC OTC) 20 MG tablet Take 1 tablet (20 mg total) by mouth at bedtime.  . predniSONE (DELTASONE) 5 MG tablet Take 10 mg by mouth daily.  . Probiotic CAPS Take 1 capsule by mouth daily.  Marland Kitchen Respiratory Therapy Supplies (FLUTTER) DEVI 1 Device by Does not apply route 2 (two) times daily.  . traMADol (ULTRAM) 50 MG tablet SMARTSIG:1 Tablet(s) By Mouth Every 12 Hours PRN  . vitamin B-12 (CYANOCOBALAMIN) 500 MCG tablet Take 500 mcg by mouth daily.          Objective:   Physical Exam   08/09/2019        159  04/07/2018        156  01/04/2018      153  12/05/2017        150  08/31/2017        154  07/22/2017        153   08/17/12 157 lb 9.6 oz (71.487 kg)  05/04/11 156 lb (70.761 kg)  04/20/11 156 lb 6.4 oz (70.943 kg)     Vital signs reviewed  08/09/2019  - Note at rest 02 sats  95% on RA    W/c bound elderly wf harsh coughing fits heard across the office    HEENT: Full dentures / thrush present  NECK :  without JVD/Nodes/TM/ nl carotid upstrokes bilaterally   LUNGS: no acc muscle use,  Nl contour chest with minimal insp/exp rhonchi  Bilaterally    CV:  RRR  no s3 or murmur or increase in P2, and no edema   ABD:  soft and nontender with nl inspiratory excursion in the supine position. No bruits or organomegaly appreciated, bowel sounds nl  MS:  Nl gait/ ext warm without deformities, calf tenderness, cyanosis or clubbing No obvious joint restrictions   SKIN: warm and dry without lesions    NEURO:  alert, approp, nl sensorium with  no motor or cerebellar deficits apparent.      I personally reviewed images and agree with radiology impression as follows:  CXR:    PA and Lateral 08/06/19  Bibasilar atelectasis. Lungs otherwise clear. Cardiac silhouette Normal. My review:  No acute changes       Assessment & Plan:

## 2019-08-10 ENCOUNTER — Ambulatory Visit: Payer: Medicare Other | Admitting: Internal Medicine

## 2019-08-10 ENCOUNTER — Encounter (HOSPITAL_BASED_OUTPATIENT_CLINIC_OR_DEPARTMENT_OTHER): Payer: Self-pay | Admitting: Otolaryngology

## 2019-08-10 ENCOUNTER — Encounter: Payer: Self-pay | Admitting: Internal Medicine

## 2019-08-10 ENCOUNTER — Other Ambulatory Visit: Payer: Self-pay

## 2019-08-10 DIAGNOSIS — B370 Candidal stomatitis: Secondary | ICD-10-CM | POA: Insufficient documentation

## 2019-08-10 NOTE — Assessment & Plan Note (Addendum)
Changed ppi to ac and h2 to hs as per guidelines for refractory cough possible related to GERD/LPR

## 2019-08-10 NOTE — Assessment & Plan Note (Signed)
Cough since 1980   01/28/12 CT chest:  Mild bronchiectatic change in the right middle lobe and  lingula. In view of these changes, MAC would be a consideration - HRCT 10/13/2017  1. Scattered mild cylindrical and varicoid bronchiectasis, tree-in-bud opacities and mucoid impaction in both lungs, predominantly in the upper lobes and right middle lobe, mildly progressed since 10/06/2016 chest CT, particularly in anterior left upper lobe. 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 2. Moderate centrilobular emphysema with diffuse bronchial wall thickening, suggesting COPD. - Sinus CT 01/02/18 > Clear paranasal sinuses - 01/04/2018 added prn augmentin for flares  - 04/07/2018 added flutter, stopped macrodantin  - flare since late feb 2021 refractory to abx and pred  > 08/09/2019 acute eval rec cyclical cough rx   Of the three most common causes of  Sub-acute / recurrent or chronic cough, only one (GERD)  can actually contribute to/ trigger  the other two (asthma and post nasal drip syndrome)  and perpetuate the cylce of cough.  While not intuitively obvious, many patients with chronic low grade reflux do not cough until there is a primary insult that disturbs the protective epithelial barrier and exposes sensitive nerve endings.   This is typically viral but can due to PNDS and  either may apply here.   >>>  The point is that once this occurs, it is difficult to eliminate the cycle  using anything but a maximally effective acid suppression regimen at least in the short run, accompanied by an appropriate diet to address non acid GERD and control / eliminate the cough itself for at least 3 days with tramadol

## 2019-08-10 NOTE — Assessment & Plan Note (Addendum)
Noted 08/09/2019 on pred/ multiple abx > rx clotrimazole troche    >>>  F/u in 4 weeks taper pred per rheum in meantime if possible           Each maintenance medication was reviewed in detail including emphasizing most importantly the difference between maintenance and prns and under what circumstances the prns are to be triggered using an action plan format where appropriate.  Total time for H and P, chart review, counseling, teaching device and generating customized AVS unique to this acute  office visit / charting = 30 min

## 2019-08-14 ENCOUNTER — Encounter (HOSPITAL_BASED_OUTPATIENT_CLINIC_OR_DEPARTMENT_OTHER)
Admission: RE | Admit: 2019-08-14 | Discharge: 2019-08-14 | Disposition: A | Discharge status: Home / Self Care | Payer: Medicare Other | Source: Ambulatory Visit | Attending: Otolaryngology | Admitting: Otolaryngology

## 2019-08-14 ENCOUNTER — Other Ambulatory Visit (HOSPITAL_COMMUNITY)
Admission: RE | Admit: 2019-08-14 | Discharge: 2019-08-14 | Disposition: A | Discharge status: Home / Self Care | Payer: Medicare Other | Source: Ambulatory Visit | Attending: Otolaryngology | Admitting: Otolaryngology

## 2019-08-14 DIAGNOSIS — Z20822 Contact with and (suspected) exposure to covid-19: Secondary | ICD-10-CM | POA: Insufficient documentation

## 2019-08-14 DIAGNOSIS — Z01812 Encounter for preprocedural laboratory examination: Secondary | ICD-10-CM | POA: Insufficient documentation

## 2019-08-14 LAB — BASIC METABOLIC PANEL
Anion gap: 14 (ref 5–15)
BUN: 11 mg/dL (ref 8–23)
CO2: 27 mmol/L (ref 22–32)
Calcium: 8.6 mg/dL — ABNORMAL LOW (ref 8.9–10.3)
Chloride: 94 mmol/L — ABNORMAL LOW (ref 98–111)
Creatinine, Ser: 0.77 mg/dL (ref 0.44–1.00)
GFR calc Af Amer: >60 mL/min (ref >60)
GFR calc non Af Amer: >60 mL/min (ref >60)
Glucose, Bld: 251 mg/dL — ABNORMAL HIGH (ref 70–99)
Potassium: 3.7 mmol/L (ref 3.5–5.1)
Sodium: 135 mmol/L (ref 135–145)

## 2019-08-15 LAB — SARS CORONAVIRUS 2 (TAT 6-24 HRS): SARS Coronavirus 2: NEGATIVE

## 2019-08-17 ENCOUNTER — Encounter (HOSPITAL_BASED_OUTPATIENT_CLINIC_OR_DEPARTMENT_OTHER)
Admission: RE | Disposition: A | Discharge status: Home / Self Care | Payer: Self-pay | Source: Home / Self Care | Attending: Otolaryngology

## 2019-08-17 ENCOUNTER — Ambulatory Visit (HOSPITAL_BASED_OUTPATIENT_CLINIC_OR_DEPARTMENT_OTHER): Payer: Medicare Other | Admitting: Anesthesiology

## 2019-08-17 ENCOUNTER — Encounter (HOSPITAL_BASED_OUTPATIENT_CLINIC_OR_DEPARTMENT_OTHER): Payer: Self-pay | Admitting: Otolaryngology

## 2019-08-17 ENCOUNTER — Other Ambulatory Visit: Payer: Self-pay

## 2019-08-17 ENCOUNTER — Ambulatory Visit (HOSPITAL_BASED_OUTPATIENT_CLINIC_OR_DEPARTMENT_OTHER)
Admission: RE | Admit: 2019-08-17 | Discharge: 2019-08-17 | Disposition: A | Discharge status: Home / Self Care | Payer: Medicare Other | Attending: Otolaryngology | Admitting: Otolaryngology

## 2019-08-17 DIAGNOSIS — I6529 Occlusion and stenosis of unspecified carotid artery: Secondary | ICD-10-CM | POA: Diagnosis not present

## 2019-08-17 DIAGNOSIS — M315 Giant cell arteritis with polymyalgia rheumatica: Secondary | ICD-10-CM | POA: Diagnosis present

## 2019-08-17 DIAGNOSIS — Z87891 Personal history of nicotine dependence: Secondary | ICD-10-CM | POA: Diagnosis not present

## 2019-08-17 DIAGNOSIS — F419 Anxiety disorder, unspecified: Secondary | ICD-10-CM | POA: Diagnosis not present

## 2019-08-17 DIAGNOSIS — M316 Other giant cell arteritis: Secondary | ICD-10-CM | POA: Diagnosis not present

## 2019-08-17 DIAGNOSIS — Z7952 Long term (current) use of systemic steroids: Secondary | ICD-10-CM | POA: Insufficient documentation

## 2019-08-17 DIAGNOSIS — Z79899 Other long term (current) drug therapy: Secondary | ICD-10-CM | POA: Insufficient documentation

## 2019-08-17 DIAGNOSIS — K219 Gastro-esophageal reflux disease without esophagitis: Secondary | ICD-10-CM | POA: Insufficient documentation

## 2019-08-17 DIAGNOSIS — Z8542 Personal history of malignant neoplasm of other parts of uterus: Secondary | ICD-10-CM | POA: Insufficient documentation

## 2019-08-17 DIAGNOSIS — R519 Headache, unspecified: Secondary | ICD-10-CM | POA: Diagnosis not present

## 2019-08-17 DIAGNOSIS — M353 Polymyalgia rheumatica: Secondary | ICD-10-CM | POA: Insufficient documentation

## 2019-08-17 DIAGNOSIS — I10 Essential (primary) hypertension: Secondary | ICD-10-CM | POA: Insufficient documentation

## 2019-08-17 DIAGNOSIS — I70219 Atherosclerosis of native arteries of extremities with intermittent claudication, unspecified extremity: Secondary | ICD-10-CM | POA: Diagnosis not present

## 2019-08-17 DIAGNOSIS — J449 Chronic obstructive pulmonary disease, unspecified: Secondary | ICD-10-CM | POA: Insufficient documentation

## 2019-08-17 HISTORY — PX: ARTERY BIOPSY: SHX891

## 2019-08-17 HISTORY — DX: Dyspnea, unspecified: R06.00

## 2019-08-17 SURGERY — BIOPSY TEMPORAL ARTERY
Anesthesia: Monitor Anesthesia Care | Site: Face

## 2019-08-17 MED ORDER — LIDOCAINE-EPINEPHRINE 1 %-1:100000 IJ SOLN
INTRAMUSCULAR | Status: DC | PRN
  Administered 2019-08-17: 1.5 mL

## 2019-08-17 MED ORDER — ACETAMINOPHEN 500 MG PO TABS
1000.0000 mg | ORAL_TABLET | Freq: Once | ORAL | Status: AC
  Administered 2019-08-17: 1000 mg via ORAL

## 2019-08-17 MED ORDER — OXYCODONE HCL 5 MG/5ML PO SOLN: 5.0000 mg | Freq: Once | ORAL | Status: DC | PRN

## 2019-08-17 MED ORDER — OXYCODONE HCL 5 MG PO TABS: 5.0000 mg | ORAL_TABLET | Freq: Once | ORAL | Status: DC | PRN

## 2019-08-17 MED ORDER — FENTANYL CITRATE (PF) 100 MCG/2ML IJ SOLN: 25.0000 ug | INTRAMUSCULAR | Status: DC | PRN

## 2019-08-17 MED ORDER — LIDOCAINE HCL (CARDIAC) PF 100 MG/5ML IV SOSY
PREFILLED_SYRINGE | INTRAVENOUS | Status: DC | PRN
  Administered 2019-08-17: 30 mg via INTRAVENOUS

## 2019-08-17 MED ORDER — ACETAMINOPHEN 500 MG PO TABS
ORAL_TABLET | ORAL | Status: AC
  Filled 2019-08-17: qty 2

## 2019-08-17 MED ORDER — LACTATED RINGERS IV SOLN: INTRAVENOUS | Status: DC

## 2019-08-17 MED ORDER — CEFAZOLIN SODIUM-DEXTROSE 2-3 GM-%(50ML) IV SOLR
INTRAVENOUS | Status: DC | PRN
  Administered 2019-08-17: 2 g via INTRAVENOUS

## 2019-08-17 MED ORDER — PROPOFOL 10 MG/ML IV BOLUS
INTRAVENOUS | Status: DC | PRN
  Administered 2019-08-17 (×5): 10 mg via INTRAVENOUS
  Administered 2019-08-17: 20 mg via INTRAVENOUS

## 2019-08-17 MED ORDER — ONDANSETRON HCL 4 MG/2ML IJ SOLN: 4.0000 mg | Freq: Once | INTRAMUSCULAR | Status: DC | PRN

## 2019-08-17 SURGICAL SUPPLY — 41 items
BLADE CLIPPER SURG (BLADE) ×2 IMPLANT
BLADE SURG 15 STRL LF DISP TIS (BLADE) ×1 IMPLANT
BLADE SURG 15 STRL SS (BLADE) ×2
CANISTER SUCT 1200ML W/VALVE (MISCELLANEOUS) IMPLANT
CORD BIPOLAR FORCEPS 12FT (ELECTRODE) IMPLANT
COVER BACK TABLE 60X90IN (DRAPES) IMPLANT
COVER MAYO STAND STRL (DRAPES) ×2 IMPLANT
COVER PROBE W GEL 5X96 (DRAPES) IMPLANT
COVER WAND RF STERILE (DRAPES) IMPLANT
DERMABOND ADVANCED (GAUZE/BANDAGES/DRESSINGS) ×1
DERMABOND ADVANCED .7 DNX12 (GAUZE/BANDAGES/DRESSINGS) ×1 IMPLANT
DRAPE SURG 17X23 STRL (DRAPES) IMPLANT
DRAPE U-SHAPE 76X120 STRL (DRAPES) ×2 IMPLANT
ELECT COATED BLADE 2.86 ST (ELECTRODE) ×2 IMPLANT
ELECT NEEDLE BLADE 2-5/6 (NEEDLE) IMPLANT
ELECT REM PT RETURN 9FT ADLT (ELECTROSURGICAL) ×2
ELECTRODE REM PT RTRN 9FT ADLT (ELECTROSURGICAL) ×1 IMPLANT
GAUZE SPONGE 4X4 12PLY STRL LF (GAUZE/BANDAGES/DRESSINGS) IMPLANT
GLOVE BIO SURGEON STRL SZ7.5 (GLOVE) ×2 IMPLANT
GLOVE BIOGEL M 6.5 STRL (GLOVE) ×2 IMPLANT
GOWN STRL REUS W/ TWL LRG LVL3 (GOWN DISPOSABLE) ×2 IMPLANT
GOWN STRL REUS W/TWL LRG LVL3 (GOWN DISPOSABLE) ×4
NEEDLE HYPO 25X1 1.5 SAFETY (NEEDLE) IMPLANT
NEEDLE PRECISIONGLIDE 27X1.5 (NEEDLE) IMPLANT
NS IRRIG 1000ML POUR BTL (IV SOLUTION) ×2 IMPLANT
PENCIL SMOKE EVACUATOR (MISCELLANEOUS) ×2 IMPLANT
SET BASIN DAY SURGERY F.S. (CUSTOM PROCEDURE TRAY) ×2 IMPLANT
SHEET MEDIUM DRAPE 40X70 STRL (DRAPES) ×2 IMPLANT
SPONGE GAUZE 2X2 8PLY STRL LF (GAUZE/BANDAGES/DRESSINGS) IMPLANT
SUCTION FRAZIER HANDLE 10FR (MISCELLANEOUS)
SUCTION TUBE FRAZIER 10FR DISP (MISCELLANEOUS) IMPLANT
SUT SILK 3 0 TIES 17X18 (SUTURE) ×2
SUT SILK 3-0 18XBRD TIE BLK (SUTURE) ×1 IMPLANT
SUT SILK 4 0 TIES 17X18 (SUTURE) IMPLANT
SUT VIC AB 4-0 P-3 18XBRD (SUTURE) ×1 IMPLANT
SUT VIC AB 4-0 P3 18 (SUTURE) ×2
SWABSTICK POVIDONE IODINE SNGL (MISCELLANEOUS) IMPLANT
SYR CONTROL 10ML LL (SYRINGE) ×2 IMPLANT
TOWEL GREEN STERILE FF (TOWEL DISPOSABLE) ×2 IMPLANT
TRAY DSU PREP LF (CUSTOM PROCEDURE TRAY) ×2 IMPLANT
TUBE CONNECTING 20X1/4 (TUBING) IMPLANT

## 2019-08-17 NOTE — Anesthesia Preprocedure Evaluation (Addendum)
Anesthesia Evaluation  Patient identified by MRN, date of birth, ID band Patient awake    Reviewed: Allergy & Precautions, NPO status , Patient's Chart, lab work & pertinent test results, reviewed documented beta blocker date and time   Airway Mallampati: I  TM Distance: >3 FB Neck ROM: Full   Comment: Good neck ROM despite recent C spine surgery  Dental  (+) Edentulous Upper, Edentulous Lower   Pulmonary shortness of breath, with exertion and at rest, COPD,  COPD inhaler, former smoker,  Quit smoking 2019, 25 pack year history, COPD GOLD 1  Visit to pulmonologist about 2 weeks ago, diagnosed with acute bronchitis and prescribed an antibiotic x 1 wk (finished course about 1 week ago), but has not felt better since then. Did her nebulizer at home yesterday. States she normally can cough up her secretions in the morning but has been unable to do so today.     + decreased breath sounds+ wheezing      Cardiovascular hypertension, Pt. on medications and Pt. on home beta blockers  Rhythm:Regular Rate:Normal  Took all of her antihypertensives this morning but still poorly controlled. SBP anywhere from 179-210 in preop this morning.    Neuro/Psych PSYCHIATRIC DISORDERS Anxiety S/p ACDF 2020    GI/Hepatic Neg liver ROS, PUD, GERD  Medicated and Controlled,  Endo/Other  negative endocrine ROS  Renal/GU negative Renal ROS   Hx uterine ca s/p hysterectomy  negative genitourinary   Musculoskeletal Polymyalgia rheumatica    Abdominal Normal abdominal exam  (+)   Peds  Hematology negative hematology ROS (+)   Anesthesia Other Findings Temporal arteritis  Chronic steroids   Reproductive/Obstetrics negative OB ROS                            Anesthesia Physical Anesthesia Plan  ASA: IV  Anesthesia Plan: MAC   Post-op Pain Management:    Induction:   PONV Risk Score and Plan: 2 and Propofol infusion,  TIVA and Treatment may vary due to age or medical condition  Airway Management Planned: Mask and Natural Airway  Additional Equipment: None  Intra-op Plan:   Post-operative Plan:   Informed Consent: I have reviewed the patients History and Physical, chart, labs and discussed the procedure including the risks, benefits and alternatives for the proposed anesthesia with the patient or authorized representative who has indicated his/her understanding and acceptance.     Dental advisory given  Plan Discussed with: CRNA  Anesthesia Plan Comments: (Patient given albuterol inhaler to take before heading back to OR. D/w patient that she is at increased risk of pulmonary complications in the perioperative period given her poor baseline respiratory status in conjunction with a recent acute bronchitis, pt wishes to proceed. D/w surgeon, decision to use very mild sedation and LA to minimize any potential respiratory issues. )      Anesthesia Quick Evaluation

## 2019-08-17 NOTE — Anesthesia Procedure Notes (Signed)
Procedure Name: MAC Date/Time: 08/17/2019 9:42 AM Performed by: Lieutenant Diego, CRNA Pre-anesthesia Checklist: Patient identified, Emergency Drugs available, Suction available, Patient being monitored and Timeout performed Patient Re-evaluated:Patient Re-evaluated prior to induction Oxygen Delivery Method: Simple face mask

## 2019-08-17 NOTE — Op Note (Signed)
DATE OF PROCEDURE:  08/17/2019                              OPERATIVE REPORT  SURGEON:  Leta Baptist, MD  PREOPERATIVE DIAGNOSES: 1. Temporal arteritis  POSTOPERATIVE DIAGNOSES: 1. Temporal arteritis  PROCEDURE PERFORMED:  Right temporal artery biopsy  ANESTHESIA: Local anesthesia with IV sedation.  COMPLICATIONS:  None.  ESTIMATED BLOOD LOSS:  Minimal.  INDICATION FOR PROCEDURE:  Heidi Hobbs is a 79 y.o. female with a history of severe headache and pain over her temporal regions for the past 2+ months.  Her temporal pain was worse on the right side.  The patient has a history of polymyalgia rheumatica.  She was on long term Prednisone treatment.  Over the past 2 months, the patient has noted increasing bilateral temporal headaches, worse on the right side.  Her Prednisone was increased to 80 mg a day.  Despite the treatment, she continued to have recurrent temporal headaches.  She was diagnosed as having giant cell / temporal arteritis.  Due to her persistent symptoms despite being treated with high dose Prednisone, the patient was referred for a temporal artery biopsy to evaluate the diagnosis of giant cell arteritis. Based on the above findings, the decision was made for the patient to undergo the temporal artery biopsy procedure.  The risks, benefits, alternatives, and details of the procedure were discussed with the patient.  Questions were invited and answered.  Informed consent was obtained.  DESCRIPTION:  The patient was taken to the operating room and placed supine on the operating table.  IV sedation was administered by the anesthesiologist.  The patient was positioned and prepped and draped in a standard fashion for right temporal artery biopsy.  The location of the right temporal artery was confirmed using Doppler device.  The area was prepped Draped in a sterile fashion.  1% lidocaine with 1-100,000 epinephrine was infiltrated locally.  A 2 cm vertical incision was made.  The  incision was carried down to the subcutaneous layer.  Careful dissection was performed to locate and isolate the temporal artery.  A 2 cm segment was ligated and excised.  The temporal artery segment was sent to the pathology department for permanent histologic identification.  The surgical site was copiously irrigated.  The incision was closed in layers with 4-0 Vicryl and Dermabond.  The care of the patient was turned over to the anesthesiologist.  The patient was awakened from anesthesia without difficulty.  The patient was extubated and transferred to the recovery room in good condition.  OPERATIVE FINDINGS: A 2 cm segment of temporal artery was removed.  SPECIMEN: Temporal artery biopsy.  FOLLOWUP CARE:  The patient will be discharged home once awake and alert.   Porter Moes W Keyonte Cookston 08/17/2019 10:23 AM

## 2019-08-17 NOTE — Anesthesia Postprocedure Evaluation (Signed)
Anesthesia Post Note  Patient: Heidi Hobbs  Procedure(s) Performed: BIOPSY TEMPORAL ARTERY (N/A Face)     Patient location during evaluation: PACU Anesthesia Type: MAC Level of consciousness: awake and alert Pain management: pain level controlled Vital Signs Assessment: post-procedure vital signs reviewed and stable Respiratory status: spontaneous breathing, nonlabored ventilation and respiratory function stable Cardiovascular status: blood pressure returned to baseline and stable Postop Assessment: no apparent nausea or vomiting Anesthetic complications: no    Last Vitals:  Vitals:   08/17/19 1020 08/17/19 1030  BP: (!) 162/79 139/70  Pulse: 83 73  Resp: 19 15  Temp: 36.9 C   SpO2: 98% 93%    Last Pain:  Vitals:   08/17/19 1020  TempSrc:   PainSc: 0-No pain                 Jarome Matin [name]

## 2019-08-17 NOTE — Discharge Instructions (Signed)
  Post Anesthesia Home Care Instructions  Activity: Get plenty of rest for the remainder of the day. A responsible individual must stay with you for 24 hours following the procedure.  For the next 24 hours, DO NOT: -Drive a car -Paediatric nurse -Drink alcoholic beverages -Take any medication unless instructed by your physician -Make any legal decisions or sign important papers.  Meals: Start with liquid foods such as gelatin or soup. Progress to regular foods as tolerated. Avoid greasy, spicy, heavy foods. If nausea and/or vomiting occur, drink only clear liquids until the nausea and/or vomiting subsides. Call your physician if vomiting continues.  Special Instructions/Symptoms: Your throat may feel dry or sore from the anesthesia or the breathing tube placed in your throat during surgery. If this causes discomfort, gargle with warm salt water. The discomfort should disappear within 24 hours.  If you had a scopolamine patch placed behind your ear for the management of post- operative nausea and/or vomiting:  1. The medication in the patch is effective for 72 hours, after which it should be removed.  Wrap patch in a tissue and discard in the trash. Wash hands thoroughly with soap and water. 2. You may remove the patch earlier than 72 hours if you experience unpleasant side effects which may include dry mouth, dizziness or visual disturbances. 3. Avoid touching the patch. Wash your hands with soap and water after contact with the patch.    ---------------  The patient may resume all her previous activities and diet. No wound care needed. Cold compresses as needed for swelling.

## 2019-08-17 NOTE — Transfer of Care (Signed)
Immediate Anesthesia Transfer of Care Note  Patient: Heidi Hobbs  Procedure(s) Performed: BIOPSY TEMPORAL ARTERY (N/A Face)  Patient Location: PACU  Anesthesia Type:MAC  Level of Consciousness: awake  Airway & Oxygen Therapy: Patient Spontanous Breathing and Patient connected to face mask oxygen  Post-op Assessment: Report given to RN and Post -op Vital signs reviewed and stable  Post vital signs: Reviewed and stable  Last Vitals:  Vitals Value Taken Time  BP 162/79 08/17/19 1020  Temp    Pulse 80 08/17/19 1021  Resp 20 08/17/19 1021  SpO2 99 % 08/17/19 1021  Vitals shown include unvalidated device data.  Last Pain:  Vitals:   08/17/19 0834  TempSrc: Oral  PainSc: 0-No pain         Complications: No apparent anesthesia complications

## 2019-08-17 NOTE — H&P (Signed)
Cc: Severe headaches  HPI: The patient is an 79 year old female who presents today with her family.  The patient is seen in consultation requested by Dr. Gavin Pound.  According to the family, the patient has been experiencing severe headache and pain over her temporal regions for the past 2+ months.  Her temporal pain is worse on the right side.  The patient has a history of polymyalgia rheumatica.  She was on long term Prednisone treatment.  Over the past 2 months, the patient has noted increasing bilateral temporal headaches, worse on the right side.  Her Prednisone was increased to 80 mg a day.  Despite the treatment, she continues to have recurrent temporal headaches.  She was diagnosed as having giant cell arteritis.  Due to her persistent symptoms despite being treated with high dose Prednisone, the patient was referred for a temporal artery biopsy to evaluate the diagnosis of giant cell arteritis.  The patient previously underwent cervical spine surgery to treat her arm pain.  She has no other history of ENT surgery.    The patient's review of systems (constitutional, eyes, ENT, cardiovascular, respiratory, GI, musculoskeletal, skin, neurologic, psychiatric, endocrine, hematologic, allergic) is noted in the ROS questionnaire.  It is reviewed with the patient and her daughter.   Family health history: Diabetes, heart disease.  Major events: Neck surgery.  Ongoing medical problems: Night sweats, wet macular, reflux, autoimmune disorder, hypertension.  Social history: The patient is a widow. She denies the use of tobacco, alcohol or illegal drugs.   Exam: General: Communicates without difficulty, well nourished, no acute distress. Head: Normocephalic, no evidence injury, no tenderness, facial buttresses intact without stepoff. Face/sinus: No tenderness to palpation and percussion. Facial movement is normal and symmetric. Eyes: PERRL, EOMI. No scleral icterus, conjunctivae clear. Neuro: CN II  exam reveals vision grossly intact.  No nystagmus at any point of gaze. Ears: Auricles well formed without lesions.  Ear canals are intact without mass or lesion.  No erythema or edema is appreciated.  The TMs are intact without fluid. Nose: External evaluation reveals normal support and skin without lesions.  Dorsum is intact.  Anterior rhinoscopy reveals pink mucosa over anterior aspect of inferior turbinates and intact septum.  No purulence noted. Oral:  Oral cavity and oropharynx are intact, symmetric, without erythema or edema.  Mucosa is moist without lesions. Neck: Full range of motion without pain.  There is no significant lymphadenopathy.  No masses palpable.  Thyroid bed within normal limits to palpation.  Parotid glands and submandibular glands equal bilaterally without mass.  Trachea is midline. Neuro:  CN 2-12 grossly intact. Gait wide-based.   Assessment 1.  Bilateral severe temporal headaches, worse on the right side.  2.  Possible giant cell arteritis.   3.  History of polymyalgia rheumatica.   Plan  1.  The patient's physical exam findings are reviewed with the patient.  2.  Based on the above findings, we will proceed with a right temporal artery biopsy.  The risks, benefits, alternatives and details of the procedure are reviewed with the family.  Questions are invited and answered.  3.  The patient would like to proceed with the procedure.

## 2019-08-20 ENCOUNTER — Encounter: Payer: Self-pay | Admitting: *Deleted

## 2019-08-20 LAB — SURGICAL PATHOLOGY

## 2019-08-20 NOTE — Addendum Note (Signed)
Addendum  created 08/20/19 1233 by Willa Frater, CRNA   Charge Capture section accepted

## 2019-08-28 DIAGNOSIS — M353 Polymyalgia rheumatica: Secondary | ICD-10-CM | POA: Diagnosis not present

## 2019-08-28 DIAGNOSIS — M150 Primary generalized (osteo)arthritis: Secondary | ICD-10-CM | POA: Diagnosis not present

## 2019-08-28 DIAGNOSIS — Z7952 Long term (current) use of systemic steroids: Secondary | ICD-10-CM | POA: Diagnosis not present

## 2019-08-28 DIAGNOSIS — R519 Headache, unspecified: Secondary | ICD-10-CM | POA: Diagnosis not present

## 2019-08-30 ENCOUNTER — Other Ambulatory Visit: Payer: Self-pay | Admitting: Pulmonary Disease

## 2019-08-30 DIAGNOSIS — K219 Gastro-esophageal reflux disease without esophagitis: Secondary | ICD-10-CM

## 2019-09-02 ENCOUNTER — Telehealth: Payer: Self-pay | Admitting: Internal Medicine

## 2019-09-02 NOTE — Telephone Encounter (Signed)
Severe non-productive cough eval with cxr 6/15 neg acute change per daughter concerned re rib fx from cough so rx by rheum = percocet but already has tramadol and confused as to what to do with the two choices  rec mucinex dm 1200 mg every 12 hours / use flutter / percocet or tramadol up t 100 mg every 4 hours but should pick one or the other, not use both

## 2019-09-04 ENCOUNTER — Other Ambulatory Visit: Payer: Self-pay | Admitting: Internal Medicine

## 2019-09-04 DIAGNOSIS — E099 Drug or chemical induced diabetes mellitus without complications: Secondary | ICD-10-CM | POA: Diagnosis not present

## 2019-09-04 DIAGNOSIS — H532 Diplopia: Secondary | ICD-10-CM | POA: Diagnosis not present

## 2019-09-04 DIAGNOSIS — R519 Headache, unspecified: Secondary | ICD-10-CM

## 2019-09-04 DIAGNOSIS — F1920 Other psychoactive substance dependence, uncomplicated: Secondary | ICD-10-CM | POA: Diagnosis not present

## 2019-09-05 ENCOUNTER — Ambulatory Visit: Payer: Medicare Other

## 2019-09-05 DIAGNOSIS — R519 Headache, unspecified: Secondary | ICD-10-CM | POA: Diagnosis not present

## 2019-09-05 DIAGNOSIS — H532 Diplopia: Secondary | ICD-10-CM | POA: Diagnosis not present

## 2019-09-13 DIAGNOSIS — H3530 Unspecified macular degeneration: Secondary | ICD-10-CM | POA: Diagnosis not present

## 2019-09-13 DIAGNOSIS — Z961 Presence of intraocular lens: Secondary | ICD-10-CM | POA: Diagnosis not present

## 2019-09-13 DIAGNOSIS — M316 Other giant cell arteritis: Secondary | ICD-10-CM | POA: Diagnosis not present

## 2019-09-13 DIAGNOSIS — H353230 Exudative age-related macular degeneration, bilateral, stage unspecified: Secondary | ICD-10-CM | POA: Diagnosis not present

## 2019-09-13 DIAGNOSIS — N390 Urinary tract infection, site not specified: Secondary | ICD-10-CM | POA: Diagnosis not present

## 2019-09-18 ENCOUNTER — Ambulatory Visit (INDEPENDENT_AMBULATORY_CARE_PROVIDER_SITE_OTHER): Payer: Medicare Other | Admitting: Pulmonary Disease

## 2019-09-18 ENCOUNTER — Encounter: Payer: Self-pay | Admitting: Pulmonary Disease

## 2019-09-18 ENCOUNTER — Other Ambulatory Visit: Payer: Self-pay

## 2019-09-18 VITALS — BP 122/66 | HR 117 | Temp 98.0°F | Ht 68.0 in | Wt 160.0 lb

## 2019-09-18 DIAGNOSIS — R059 Cough, unspecified: Secondary | ICD-10-CM

## 2019-09-18 DIAGNOSIS — I776 Arteritis, unspecified: Secondary | ICD-10-CM

## 2019-09-18 DIAGNOSIS — J479 Bronchiectasis, uncomplicated: Secondary | ICD-10-CM | POA: Diagnosis not present

## 2019-09-18 DIAGNOSIS — M353 Polymyalgia rheumatica: Secondary | ICD-10-CM | POA: Diagnosis not present

## 2019-09-18 DIAGNOSIS — R05 Cough: Secondary | ICD-10-CM

## 2019-09-18 DIAGNOSIS — J449 Chronic obstructive pulmonary disease, unspecified: Secondary | ICD-10-CM | POA: Diagnosis not present

## 2019-09-18 DIAGNOSIS — H353190 Nonexudative age-related macular degeneration, unspecified eye, stage unspecified: Secondary | ICD-10-CM

## 2019-09-18 NOTE — Assessment & Plan Note (Signed)
Plan: Continue follow-up with [place] follow-up with rheumatology

## 2019-09-18 NOTE — Assessment & Plan Note (Signed)
Plan: Continue follow-up with rheumatology Obtain optometry evaluation later on this week Continue prednisone as managed by rheumatology Contact rheumatology for earlier follow-up given acute worsening symptoms

## 2019-09-18 NOTE — Assessment & Plan Note (Signed)
2019 CT chest shows bronchiectasis as well as favoring MAI Increased cough-4 to 6 weeks Not using flutter valve correctly  Plan: Restart flutter valve today CT chest ordered today Start Bactrim as ordered by urology for UTI

## 2019-09-18 NOTE — Assessment & Plan Note (Signed)
Macular degeneration Patient now reporting double vision after high-dose prednisone  Plan: Keep upcoming appointment with optometry later on this week Notify optometrist tomorrow that you are having symptoms of worsened vision does goals double vision, confirm they are willing to manage this in the outpatient setting.  If not please seek emergent evaluation

## 2019-09-18 NOTE — Patient Instructions (Addendum)
You were seen today by [name], NP  for:   1. Bronchiectasis without acute exacerbation ([place]) 2. COPD GOLD 1  We will order a CT of your chest to further evaluate your symptoms  Bronchiectasis: This is the medical term which indicates that you have damage, dilated airways making you more susceptible to respiratory infection. Use a flutter valve 10 breaths twice a day or 4 to 5 breaths 4-5 times a day to help clear mucus out Let us know if you have cough with change in mucus color or fevers or chills.  At that point you would need an antibiotic. Maintain a healthy nutritious diet, eating whole foods Take your medications as prescribed    3. Vasculitis ([place]) 4. Polymyalgia rheumatica ([place])  Continue follow-up with rheumatology  Continue prednisone as outlined by rheumatology  Complete follow-up with optometry  I would contact rheumatology for additional questions or concerns as well see if they can see you sooner given continued symptoms despite high-dose steroids  Complete follow-up with orthopedics  5. Nonexudative age-related macular degeneration, unspecified laterality, unspecified stage  Keep follow-up with optometry  Follow Up:    Return in about 1 week (around 09/25/2019), or if symptoms worsen or fail to improve, for Follow up with Wyn Quaker FNP-C.   Please do your part to reduce the spread of COVID-19:      Reduce your risk of any infection  and COVID19 by using the similar precautions used for avoiding the common cold or flu:  Marland Kitchen Wash your hands often with soap and warm water for at least 20 seconds.  If soap and water are not readily available, use an alcohol-based hand sanitizer with at least 60% alcohol.  . If coughing or sneezing, cover your mouth and nose by coughing or sneezing into the elbow areas of your shirt or coat, into a tissue or into your sleeve (not your hands). [name] A MASK when in public  . Avoid shaking hands with others and consider head  nods or verbal greetings only. . Avoid touching your eyes, nose, or mouth with unwashed hands.  . Avoid close contact with people who are sick. . Avoid places or events with large numbers of people in one location, like concerts or sporting events. . If you have some symptoms but not all symptoms, continue to monitor at home and seek medical attention if your symptoms worsen. . If you are having a medical emergency, call 911.   Mariano Colon / e-Visit: [email or web address]         [facility]: [facility]: [phone]                   [facility]: [phone]     It is flu season:   >>> Best ways to protect herself from the flu: Receive the yearly flu vaccine, practice good hand hygiene washing with soap and also using hand sanitizer when available, eat a nutritious meals, get adequate rest, hydrate appropriately   Please contact the office if your symptoms worsen or you have concerns that you are not improving.   Thank you for choosing [facility] for your healthcare, and for allowing [place] to partner with you on your healthcare journey. I am thankful to be able to provide care to you today.   Wyn Quaker FNP-C

## 2019-09-18 NOTE — Progress Notes (Signed)
_0  ID: Heidi Hobbs, female    DOB: 06/14/1940, 79 y.o.   MRN: [id number]  Chief Complaint  Patient presents with  . Follow-up    Bronchiectasis follow-up    Referring provider: Shon Baton, MD  HPI:  79 year old female former smoker followed in our office for COPD, cough, bronchiectasis  PMH: CAD, hypertension, vasculitis, Smoker/ Smoking History: Former smoker Maintenance:  None  Pt of: [name]  09/18/2019  - Visit   79 year old female former smoker followed in our office for bronchiectasis.  She is completing a 4-week follow-up with our office today.  Last evaluated by Dr. Melvyn Novas May/2021.  Patient reporting to our office today as a follow-up visit.  She reports that there was concern that she may be developing temporal arteritis.  She is status post a biopsy that did not show any vasculitis.  Rheumatology has had the patient on high-dose prednisone for the last few months.  She is now having changes with her vision.  She is seeing optometry later on this week.  She also has an upcoming follow-up visit with orthopedics due to her severe arthritis in her back.  She also has significant right chest wall pain.  She reports that she was just placed on Bactrim today by her urologist.  They are treating her for a UTI.  Patient has had acute worsening symptoms over the last 5 to 6 days.  They have been waiting for the culture to come back prior to starting antibiotics.  Patient is afebrile today.  Patient continues to have increased cough and congestion.  Unfortunately she has not been using her flutter valve correctly.  We will discuss this today.  She is unsure if she should still be using her albuterol nebulizer.  We will discuss this today.  Questionaires / Pulmonary Flowsheets:   ACT:  No flowsheet data found.  MMRC: mMRC Dyspnea Scale mMRC Score  06/08/2019 0    Epworth:  No flowsheet data found.  Tests:    12/20/2018-Rh  factor-negative 12/20/2018-ANA-negative 12/20/2018-sed rate-22 1014/2020-CBC with differential-eosinophils absolute 0.1, eosinophils relative 1 11/27/2018-chest x-ray-emphysema, no acute disease  08/31/2017-pulmonary function test-FVC 2.95 (95% predicted), postbronchodilator ratio 68, postbronchodilator FEV1 2.23 (95% predicted) no bronchodilator response, mid flow reversibility, DLCO 16.75 (57% predicted)  10/14/2017-CT chest high-res-moderate centrilobular emphysema and diffuse bronchial wall thickening, no acute consolidative airspace disease or lung masses, scattered mild cylindrical varicoid bronchiectasis throughout both lungs predominantly in bilateral upper lobes and right middle lobe with the associated mild patchy tree-in-bud opacities and scattered mucoid impaction, 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, moderate centrilobular emphysema, patchy lobular air trapping in both lungs indicative of small airways disease,  01/03/2018-CT maxillofacial-clear paranasal sinuses    FENO:  No results found for: NITRICOXIDE  PFT: PFT Results Latest Ref Rng & Units 08/31/2017  FVC-Pre L 2.95  FVC-Predicted Pre % 95  FVC-Post L 3.27  FVC-Predicted Post % 105  Pre FEV1/FVC % % 71  Post FEV1/FCV % % 68  FEV1-Pre L 2.09  FEV1-Predicted Pre % 90  FEV1-Post L 2.23  DLCO UNC% % 57  DLCO COR %Predicted % 66  TLC L 6.53  TLC % Predicted % 117  RV % Predicted % 136    WALK:  No flowsheet data found.  Imaging: No results found.  Lab Results:  CBC    Component Value Date/Time   WBC 18.9 (H) 08/06/2019 1334   RBC 5.10 08/06/2019 1334   HGB 15.5 (  H) 08/06/2019 1334   HCT 45.7 08/06/2019 1334   PLT 260 08/06/2019 1334   MCV 89.6 08/06/2019 1334   MCH 30.4 08/06/2019 1334   MCHC 33.9 08/06/2019 1334   RDW 16.3 (H) 08/06/2019 1334   LYMPHSABS 1.4 08/06/2019 1334   MONOABS 0.8 08/06/2019 1334   EOSABS 0.0 08/06/2019 1334    BASOSABS 0.1 08/06/2019 1334    BMET    Component Value Date/Time   NA 135 08/14/2019 1500   K 3.7 08/14/2019 1500   CL 94 (L) 08/14/2019 1500   CO2 27 08/14/2019 1500   GLUCOSE 251 (H) 08/14/2019 1500   BUN 11 08/14/2019 1500   CREATININE 0.77 08/14/2019 1500   CALCIUM 8.6 (L) 08/14/2019 1500   GFRNONAA >60 08/14/2019 1500   GFRAA >60 08/14/2019 1500    BNP    Component Value Date/Time   BNP 72.6 08/06/2019 1334    ProBNP No results found for: PROBNP  Specialty Problems      Pulmonary Problems   COPD GOLD 1    Followed in Pulmonary clinic/ [facility]/ [facility]  Quit smoking 07/2017   - PFT's  08/31/2017  FEV1 2.09  (90 % ) ratio 71  p no % improvement from saba p nothing  prior to study with DLCO  57 % corrects to 66 % for alv volume          Bronchiectasis without acute exacerbation (HCC)    Followed in Pulmonary clinic/ [facility]/ [facility]  Cough since 1980   01/28/12 CT chest:  Mild bronchiectatic change in the right middle lobe and  lingula. In view of these changes, MAC would be a consideration - HRCT 10/13/2017  1. Scattered mild cylindrical and varicoid bronchiectasis, tree-in-bud opacities and mucoid impaction in both lungs, predominantly in the upper lobes and right middle lobe, mildly progressed since 10/06/2016 chest CT, particularly in anterior left upper lobe. 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 2. Moderate centrilobular emphysema with diffuse bronchial wall thickening, suggesting COPD. - Sinus CT 01/02/18 > Clear paranasal sinuses - 01/04/2018 added prn augmentin for flares  - 04/07/2018 added flutter, stopped macrodantin  - flare since late feb 2021 refractory to abx and pred  > 08/09/2019 acute eval rec cyclical cough rx       Cough    Followed in Pulmonary clinic/ [facility]/ [facility]           Allergies  Allergen Reactions  . Eggs Or Egg-Derived Products     Skin  nodules    Immunization History  Administered Date(s) Administered  . Influenza Whole 12/14/2011, 12/13/2016  . Influenza, High Dose Seasonal PF 11/21/2017  . PFIZER SARS-COV-2 Vaccination 04/23/2019, 05/21/2019  . Tdap 10/16/2011    Past Medical History:  Diagnosis Date  . Adenomatous colon polyp 1995, 2011  . Anemia   . Anxiety   . Bronchiectasis ([place])   . Cancer [facility])    uterine cancer  . Carotid stenosis    40-59% right, less than 40% left  . Cataracts, bilateral   . COPD (chronic obstructive pulmonary disease) ([place])   . Dyspnea   . Essential hypertension 02/13/2016  . Hyperlipidemia   . Hypertension   . MAC (mycobacterium avium-intracellulare complex)   . Macular degeneration, dry 02/13/2016  . Polio 1959  . Stomach ulcer 1960  . Urinary tract infection   . Vasculitis ([place])    Normocomplementemic urticarial   . Vasculitis ([place]) 02/13/2016   Followed by [facility]  [employer]     Tobacco History: Social History   Tobacco Use  Smoking Status Former Smoker  . Packs/day: 0.50  . Years: 50.00  . Pack years: 25.00  . Types: Cigarettes  . Quit date: 08/01/2017  . Years since quitting: 2.1  Smokeless Tobacco Never Used   Counseling given: Not Answered   Continue to not smoke  Outpatient Encounter Medications as of 09/18/2019  Medication Sig  . albuterol (PROVENTIL) (2.5 MG/3ML) 0.083% nebulizer solution Take 3 mLs (2.5 mg total) by nebulization every 6 (six) hours as needed for wheezing or shortness of breath.  Marland Kitchen albuterol (VENTOLIN HFA) 108 (90 Base) MCG/ACT inhaler Inhale 2 puffs into the lungs every 6 (six) hours as needed for wheezing or shortness of breath.  . ALPRAZolam (XANAX) 0.25 MG tablet Take 0.25 tablets by mouth daily as needed for anxiety.   Marland Kitchen amitriptyline (ELAVIL) 100 MG tablet Take 100 mg by mouth at bedtime.   Marland Kitchen amLODipine (NORVASC) 5 MG tablet Take 5 mg by mouth daily.   Marland Kitchen atenolol (TENORMIN) 50 MG tablet Take 50 mg by mouth daily.  Marland Kitchen  atorvastatin (LIPITOR) 40 MG tablet Take 40 mg by mouth every other day. At night  . cholecalciferol (VITAMIN D3) 25 MCG (1000 UT) tablet Take 1,000 Units by mouth daily.  Marland Kitchen docusate sodium (COLACE) 100 MG capsule You can buy this at any drug store, over the counter and follow package instructions (Patient taking differently: Take 100 mg by mouth daily. )  . EPINEPHrine 0.3 mg/0.3 mL IJ SOAJ injection Inject 0.3 mLs into the muscle once as needed for anaphylaxis.   Marland Kitchen ezetimibe (ZETIA) 10 MG tablet Take 10 mg by mouth at bedtime.   . famotidine (PEPCID) 20 MG tablet TAKE 1 TABLET BY MOUTH EVERY DAY  . hydrochlorothiazide (HYDRODIURIL) 25 MG tablet Take 25 mg by mouth daily.  Marland Kitchen losartan (COZAAR) 100 MG tablet Take 100 mg by mouth daily.  Marland Kitchen omeprazole (PRILOSEC OTC) 20 MG tablet Take 1 tablet (20 mg total) by mouth at bedtime. Take 30-60 min before first meal of the day  . predniSONE (DELTASONE) 5 MG tablet Take 10 mg by mouth daily.  . Probiotic CAPS Take 1 capsule by mouth daily.  Marland Kitchen Respiratory Therapy Supplies (FLUTTER) DEVI 1 Device by Does not apply route 2 (two) times daily.  . traMADol (ULTRAM) 50 MG tablet SMARTSIG:1 Tablet(s) By Mouth Every 12 Hours PRN  . VENTOLIN HFA 108 (90 Base) MCG/ACT inhaler INHALE 1 PUFF INTO LUNGS EVERY 6 HOURS AS NEEDED FOR WHEEZING FOR SHORTNESS OF BREATH  . vitamin B-12 (CYANOCOBALAMIN) 500 MCG tablet Take 500 mcg by mouth daily.  . [DISCONTINUED] clotrimazole (MYCELEX) 10 MG troche Take 1 tablet (10 mg total) by mouth 5 (five) times daily.   Facility-Administered Encounter Medications as of 09/18/2019  Medication  . diclofenac sodium (VOLTAREN) 1 % transdermal gel 4 g     Review of Systems  Review of Systems  Constitutional: Positive for fatigue. Negative for activity change and fever.  HENT: Negative for sinus pressure, sinus pain and sore throat.   Respiratory: Positive for cough and shortness of breath. Negative for wheezing.   Cardiovascular:  Negative for chest pain and palpitations.  Gastrointestinal: Negative for diarrhea, nausea and vomiting.  Genitourinary: Positive for difficulty urinating, dysuria, frequency and urgency.  Musculoskeletal: Positive for back pain. Negative for arthralgias.  Neurological: Positive for weakness. Negative for dizziness.  Psychiatric/Behavioral: Negative for sleep disturbance. The patient is not nervous/anxious.  Physical Exam  BP 122/66 (BP Location: Left Arm, Cuff Size: Normal)   Pulse (!) 117   Temp 98 F (36.7 C) (Oral)   Ht _0  (1.727 m)   Wt 160 lb (72.6 kg)   SpO2 96%   BMI 24.33 kg/m   Wt Readings from Last 5 Encounters:  09/18/19 160 lb (72.6 kg)  08/17/19 160 lb 7.9 oz (72.8 kg)  08/09/19 159 lb 6.4 oz (72.3 kg)  08/06/19 160 lb (72.6 kg)  06/08/19 146 lb (66.2 kg)    BMI Readings from Last 5 Encounters:  09/18/19 24.33 kg/m  08/17/19 24.40 kg/m  08/09/19 24.24 kg/m  08/06/19 24.33 kg/m  06/08/19 22.20 kg/m     Physical Exam Vitals and nursing note reviewed.  Constitutional:      General: She is not in acute distress.    Appearance: Normal appearance. She is normal weight. She is ill-appearing.  HENT:     Head: Normocephalic and atraumatic.     Right Ear: External ear normal.     Left Ear: External ear normal.     Nose: Rhinorrhea present. No congestion.     Mouth/Throat:     Mouth: Mucous membranes are moist.     Pharynx: Oropharynx is clear.  Eyes:     Pupils: Pupils are equal, round, and reactive to light.  Cardiovascular:     Rate and Rhythm: Normal rate and regular rhythm.     Pulses: Normal pulses.     Heart sounds: Normal heart sounds. No murmur heard.   Pulmonary:     Effort: Pulmonary effort is normal. No respiratory distress.     Breath sounds: No decreased air movement. No decreased breath sounds.     Comments: Scattered squeaks,?  Right lower lobe Rales Musculoskeletal:     Cervical back: Normal range of motion.     Right  lower leg: Edema (1+ pedal edema with compression stockings on) present.     Left lower leg: Edema (1+ pedal edema with compression stockings on) present.  Skin:    General: Skin is warm and dry.     Capillary Refill: Capillary refill takes less than 2 seconds.  Neurological:     General: No focal deficit present.     Mental Status: She is alert and oriented to person, place, and time. Mental status is at baseline.     Gait: Gait normal.  Psychiatric:        Mood and Affect: Mood normal.        Behavior: Behavior normal.        Thought Content: Thought content normal.        Judgment: Judgment normal.       Assessment & Plan:   Vasculitis ([place]) Plan: Continue follow-up with [facility] Continue follow-up with rheumatology  COPD GOLD 1 Plan: CT chest ordered today Use albuterol nebulized meds twice daily  Bronchiectasis without acute exacerbation ([place]) 2019 CT chest shows bronchiectasis as well as favoring MAI Increased cough-4 to 6 weeks Not using flutter valve correctly  Plan: Restart flutter valve today CT chest ordered today Start Bactrim as ordered by urology for UTI   Polymyalgia rheumatica ([place]) Plan: Continue follow-up with rheumatology Obtain optometry evaluation later on this week Continue prednisone as managed by rheumatology Contact rheumatology for earlier follow-up given acute worsening symptoms  Macular degeneration, dry Macular degeneration Patient now reporting double vision after high-dose prednisone  Plan: Keep upcoming appointment with optometry later on this week Notify optometrist tomorrow  that you are having symptoms of worsened vision does goals double vision, confirm they are willing to manage this in the outpatient setting.  If not please seek emergent evaluation    Return in about 1 week (around 09/25/2019), or if symptoms worsen or fail to improve, for Follow up with Wyn Quaker FNP-C.   [name], NP 09/18/2019   This  appointment required 45 minutes of patient care (this includes precharting, chart review, review of results, face-to-face care, etc.).

## 2019-09-18 NOTE — Assessment & Plan Note (Signed)
Plan: CT chest ordered today Use albuterol nebulized meds twice daily

## 2019-09-20 DIAGNOSIS — H5203 Hypermetropia, bilateral: Secondary | ICD-10-CM | POA: Diagnosis not present

## 2019-09-25 ENCOUNTER — Ambulatory Visit (INDEPENDENT_AMBULATORY_CARE_PROVIDER_SITE_OTHER): Payer: Medicare Other | Admitting: Orthopaedic Surgery

## 2019-09-25 ENCOUNTER — Ambulatory Visit: Payer: Self-pay

## 2019-09-25 VITALS — BP 132/77 | HR 84 | Ht 68.0 in | Wt 160.0 lb

## 2019-09-25 DIAGNOSIS — M79602 Pain in left arm: Secondary | ICD-10-CM | POA: Diagnosis not present

## 2019-09-25 DIAGNOSIS — M79601 Pain in right arm: Secondary | ICD-10-CM

## 2019-09-25 DIAGNOSIS — Z981 Arthrodesis status: Secondary | ICD-10-CM | POA: Diagnosis not present

## 2019-09-25 NOTE — Progress Notes (Signed)
Office Visit Note   Patient: Heidi Hobbs           Date of Birth: Sep 06, 1940           MRN: [id number] Visit Date: 09/25/2019              Requested by: Shon Baton, [place],  [place] [id number] PCP: Shon Baton, MD   Assessment & Plan: Visit Diagnoses:  1. Pain in both upper extremities   2. S/P cervical spinal fusion     Plan: X-rays look good we discussed some increased discomfort with neck and shoulder and back pain as expected with gradually weaning down off prednisone.  If she develops progressive radicular symptoms she can return.  We reviewed the x-rays which showed good position of her two-level fusion.  There is a 2 level where she had significant disease and other levels showed no areas of really any significant compression.  Follow-Up Instructions: Return if symptoms worsen or fail to improve.   Orders:  Orders Placed This Encounter  Procedures  . XR Cervical Spine 2 or 3 views   No orders of the defined types were placed in this encounter.     Procedures: No procedures performed   Clinical Data: No additional findings.   Subjective: No chief complaint on file.   HPI 79 year old female seen post right shoulder injection 03/21/2019.  Previous C5-6-C6-7 ACDF 11/27/2018 with no motion on flexion-extension x-rays obtained at 6 weeks postop.  She states she had some increased discomfort in her neck some in her shoulder blade some in her lower back.  She denies any radicular symptoms to her hands.  States she is felt to weak and achy and is been on prednisone 80 mg 1 point is slowly weaned down now on 12.5 mg.  Recent x-rays showed some narrowing T12-L1 and L1-L2 disc space with mild spondylosis negative for acute fracture.  Thoracic spine showed some mild curvature.  No compression fractures were seen.  Patient had diagnosis of PMR.  Impingement right shoulder previous shoulder injection.  COPD and carotid disease of note.  Review of Systems all  other systems are negative is obtains HPI.   Objective: Vital Signs: BP 132/77   Pulse 84   Ht _0  (1.727 m)   Wt 160 lb (72.6 kg)   BMI 24.33 kg/m   Physical Exam Constitutional:      Appearance: She is well-developed.  HENT:     Head: Normocephalic.     Right Ear: External ear normal.     Left Ear: External ear normal.  Eyes:     Pupils: Pupils are equal, round, and reactive to light.  Neck:     Thyroid: No thyromegaly.     Trachea: No tracheal deviation.  Cardiovascular:     Rate and Rhythm: Normal rate.  Pulmonary:     Effort: Pulmonary effort is normal.  Abdominal:     Palpations: Abdomen is soft.  Skin:    General: Skin is warm and dry.  Neurological:     Mental Status: She is alert and oriented to person, place, and time.  Psychiatric:        Behavior: Behavior normal.     Ortho Exam upper extremity reflexes are 2+ and symmetrical.  No isolated motor weakness.  Neck incision is well-healed.  No lower extremity hyperreflexia.  Specialty Comments:  No specialty comments available.  Imaging: No results found.   PMFS History: Patient Active Problem List  Diagnosis Date Noted  . Candidiasis, mouth 08/10/2019  . Polymyalgia rheumatica ([place]) 06/08/2019  . Gastroesophageal reflux disease 06/08/2019  . Impingement syndrome of right shoulder 03/23/2019  . S/P cervical spinal fusion 12/05/2018  . Spinal stenosis of cervical region 11/22/2018  . Dysuria 04/08/2018  . Former smoker 07/24/2017  . Venous stasis ulcer ([place]) 05/25/2017  . Atherosclerosis of native arteries of extremity with intermittent claudication ([place]) 05/25/2017  . Occlusion and stenosis of vertebral artery 05/14/2016  . Aortic arch atherosclerosis ([place]) 05/14/2016  . Essential hypertension 02/13/2016  . Vasculitis ([place]) 02/13/2016  . Macular degeneration, dry 02/13/2016  . Acute appendicitis with perforation and peritoneal abscess 01/23/2016  . Carotid artery disease ([place]) 07/31/2015    . Bronchiectasis without acute exacerbation ([place]) 08/18/2012  . Cough 08/18/2012  . COPD GOLD 1 08/17/2012   Past Medical History:  Diagnosis Date  . Adenomatous colon polyp 1995, 2011  . Anemia   . Anxiety   . Bronchiectasis ([place])   . Cancer [facility])    uterine cancer  . Carotid stenosis    40-59% right, less than 40% left  . Cataracts, bilateral   . COPD (chronic obstructive pulmonary disease) ([place])   . Dyspnea   . Essential hypertension 02/13/2016  . Hyperlipidemia   . Hypertension   . MAC (mycobacterium avium-intracellulare complex)   . Macular degeneration, dry 02/13/2016  . Polio 1959  . Stomach ulcer 1960  . Urinary tract infection   . Vasculitis ([place])    Normocomplementemic urticarial   . Vasculitis ([place]) 02/13/2016   [facility] by [facility]     Family History  Problem Relation Age of Onset  . Colon cancer Father   . Esophageal cancer Brother   . Esophageal cancer Brother   . Asthma Daughter        as a child  . Stomach cancer Neg Hx   . Rectal cancer Neg Hx     Past Surgical History:  Procedure Laterality Date  . ANTERIOR CERVICAL DECOMP/DISCECTOMY FUSION N/A 11/27/2018   Procedure: CERVICAL FIVE TO SIX , CERVICAL SIX TO SEVEN, ANTERIOR CERVICAL DECOMPRESSION/DISCECTOMY FUSION, ALLOGRAFT, PLATE;  Surgeon: [name], MD;  Location: [place];  Service: Orthopedics;  Laterality: N/A;  . APPENDECTOMY     rupture appendix  . ARTERY BIOPSY N/A 08/17/2019   Procedure: BIOPSY TEMPORAL ARTERY;  Surgeon: Leta Baptist, MD;  Location: [place];  Service: ENT;  Laterality: N/A;  . BREAST ENHANCEMENT SURGERY     removal  . CATARACT EXTRACTION    . COLONOSCOPY    . COLONOSCOPY  10/14/09  . LAPAROSCOPIC APPENDECTOMY N/A 01/23/2016   Procedure: APPENDECTOMY LAPAROSCOPIC;  Surgeon: Erroll Luna, MD;  Location: [place];  Service: General;  Laterality: N/A;  . POLYPECTOMY  2011   x8  . TONSILLECTOMY AND ADENOIDECTOMY    . VAGINAL HYSTERECTOMY  1970    uterine cancer   Social History   Occupational History  . 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 Smoking status: Former Smoker    Packs/day: 0.50    Years: 50.00    Pack years: 25.00    Types: Cigarettes    Quit date: 08/01/2017    Years since quitting: 2.1  . Smokeless tobacco: Never Used  Vaping Use  . Vaping Use: Never used  Substance and Sexual Activity  . Alcohol use: No    Alcohol/week: 0.0 standard drinks  . Drug use: No  . Sexual activity: Not on  file

## 2019-09-26 ENCOUNTER — Other Ambulatory Visit: Payer: Self-pay | Admitting: Pulmonary Disease

## 2019-09-26 ENCOUNTER — Other Ambulatory Visit: Payer: Self-pay

## 2019-09-26 ENCOUNTER — Ambulatory Visit (INDEPENDENT_AMBULATORY_CARE_PROVIDER_SITE_OTHER)
Admission: RE | Admit: 2019-09-26 | Discharge: 2019-09-26 | Disposition: A | Discharge status: Home / Self Care | Payer: Medicare Other | Source: Ambulatory Visit | Attending: Pulmonary Disease | Admitting: Pulmonary Disease

## 2019-09-26 DIAGNOSIS — J432 Centrilobular emphysema: Secondary | ICD-10-CM | POA: Diagnosis not present

## 2019-09-26 DIAGNOSIS — M353 Polymyalgia rheumatica: Secondary | ICD-10-CM | POA: Diagnosis not present

## 2019-09-26 DIAGNOSIS — J449 Chronic obstructive pulmonary disease, unspecified: Secondary | ICD-10-CM

## 2019-09-26 DIAGNOSIS — J479 Bronchiectasis, uncomplicated: Secondary | ICD-10-CM | POA: Diagnosis not present

## 2019-09-26 DIAGNOSIS — I700 Atherosclerosis of aorta: Secondary | ICD-10-CM | POA: Diagnosis not present

## 2019-09-26 DIAGNOSIS — I776 Arteritis, unspecified: Secondary | ICD-10-CM

## 2019-09-26 DIAGNOSIS — R059 Cough, unspecified: Secondary | ICD-10-CM

## 2019-09-26 DIAGNOSIS — K219 Gastro-esophageal reflux disease without esophagitis: Secondary | ICD-10-CM

## 2019-09-26 DIAGNOSIS — R05 Cough: Secondary | ICD-10-CM

## 2019-09-26 DIAGNOSIS — J929 Pleural plaque without asbestos: Secondary | ICD-10-CM | POA: Diagnosis not present

## 2019-10-04 DIAGNOSIS — M545 Low back pain: Secondary | ICD-10-CM | POA: Diagnosis not present

## 2019-10-04 DIAGNOSIS — M546 Pain in thoracic spine: Secondary | ICD-10-CM | POA: Diagnosis not present

## 2019-10-04 DIAGNOSIS — M150 Primary generalized (osteo)arthritis: Secondary | ICD-10-CM | POA: Diagnosis not present

## 2019-10-04 DIAGNOSIS — M353 Polymyalgia rheumatica: Secondary | ICD-10-CM | POA: Diagnosis not present

## 2019-10-05 NOTE — Progress Notes (Signed)
Tried calling the pt and there was no answer and no option to leave a msg. WCB.

## 2019-10-08 DIAGNOSIS — N390 Urinary tract infection, site not specified: Secondary | ICD-10-CM | POA: Diagnosis not present

## 2019-10-09 ENCOUNTER — Encounter: Payer: Self-pay | Admitting: Pulmonary Disease

## 2019-10-09 ENCOUNTER — Ambulatory Visit (INDEPENDENT_AMBULATORY_CARE_PROVIDER_SITE_OTHER): Payer: Medicare Other | Admitting: Pulmonary Disease

## 2019-10-09 ENCOUNTER — Other Ambulatory Visit: Payer: Self-pay

## 2019-10-09 VITALS — BP 118/60 | HR 84 | Ht 68.0 in | Wt 163.8 lb

## 2019-10-09 DIAGNOSIS — J449 Chronic obstructive pulmonary disease, unspecified: Secondary | ICD-10-CM

## 2019-10-09 DIAGNOSIS — J479 Bronchiectasis, uncomplicated: Secondary | ICD-10-CM | POA: Diagnosis not present

## 2019-10-09 DIAGNOSIS — R05 Cough: Secondary | ICD-10-CM

## 2019-10-09 DIAGNOSIS — M353 Polymyalgia rheumatica: Secondary | ICD-10-CM

## 2019-10-09 DIAGNOSIS — R059 Cough, unspecified: Secondary | ICD-10-CM

## 2019-10-09 DIAGNOSIS — R300 Dysuria: Secondary | ICD-10-CM

## 2019-10-09 MED ORDER — STIOLTO RESPIMAT 2.5-2.5 MCG/ACT IN AERS
2.0000 | INHALATION_SPRAY | Freq: Every day | RESPIRATORY_TRACT | 0 refills | Status: DC
Start: 2019-10-09 — End: 2019-12-31

## 2019-10-09 NOTE — Patient Instructions (Addendum)
You were seen today by [name], NP  for:   1. COPD GOLD 1  Walk today in office  [place] of Stiolto Respimat inhaler >>>2 puffs daily >>>Take this no matter what >>>This is not a rescue inhaler  Samples provided today, contact our office after you finish your first sample and let us know how you are doing on this.  If you are tolerating this well and if like this is helping we can send in a prescription  Note your daily symptoms > remember "red flags" for COPD:   >>>Increase in cough >>>increase in sputum production >>>increase in shortness of breath or activity  intolerance.   If you notice these symptoms, please call the office to be seen.   We will repeat a CT of your chest in 3 months  2. Bronchiectasis without acute exacerbation (HCC)  Bronchiectasis: This is the medical term which indicates that you have damage, dilated airways making you more susceptible to respiratory infection. Use a flutter valve 10 breaths twice a day or 4 to 5 breaths 4-5 times a day to help clear mucus out Let us know if you have cough with change in mucus color or fevers or chills.  At that point you would need an antibiotic. Maintain a healthy nutritious diet, eating whole foods Take your medications as prescribed    3. Cough  Glad this has improved  4. Polymyalgia rheumatica (HCC)  Continue prednisone 7.5 mg daily as managed by rheumatology  Complete follow-up with rheumatology in 4 weeks         We recommend today:   Keep follow-up with rheumatology Keep follow-up with primary care Keep upcoming appointment for evaluation of suspected chronic UTI   Meds ordered this encounter  Medications  . Tiotropium Bromide-Olodaterol (STIOLTO RESPIMAT) 2.5-2.5 MCG/ACT AERS    Sig: Inhale 2 puffs into the lungs daily.    Dispense:  4 g    Refill:  0    Order Specific Question:   Lot Number?    Answer:   [id number]    Order Specific Question:   Expiration Date?    Answer:   03/14/2021     Order Specific Question:   Quantity    Answer:   2    Follow Up:    Return in about 6 weeks (around 11/20/2019), or if symptoms worsen or fail to improve, for Follow up with Dr. Melvyn Novas.   Please do your part to reduce the spread of COVID-19:      Reduce your risk of any infection  and COVID19 by using the similar precautions used for avoiding the common cold or flu:  Marland Kitchen Wash your hands often with soap and warm water for at least 20 seconds.  If soap and water are not readily available, use an alcohol-based hand sanitizer with at least 60% alcohol.  . If coughing or sneezing, cover your mouth and nose by coughing or sneezing into the elbow areas of your shirt or coat, into a tissue or into your sleeve (not your hands). [name] A MASK when in public  . Avoid shaking hands with others and consider head nods or verbal greetings only. . Avoid touching your eyes, nose, or mouth with unwashed hands.  . Avoid close contact with people who are sick. . Avoid places or events with large numbers of people in one location, like concerts or sporting events. . If you have some symptoms but not all symptoms, continue to monitor at home and  seek medical attention if your symptoms worsen. . If you are having a medical emergency, call 911.   [place] / e-Visit: [email or web address]         [facility]: [facility]: [phone]                   [facility]: [phone]     It is flu season:   >>> Best ways to protect herself from the flu: Receive the yearly flu vaccine, practice good hand hygiene washing with soap and also using hand sanitizer when available, eat a nutritious meals, get adequate rest, hydrate appropriately   Please contact the office if your symptoms worsen or you have concerns that you are not improving.   Thank you for  choosing  [facility] for your healthcare, and for allowing [place] to partner with you on your healthcare journey. I am thankful to be able to provide care to you today.   Wyn Quaker FNP-C

## 2019-10-09 NOTE — Assessment & Plan Note (Signed)
Currently improved Complicated due to nonadherence of patient's flutter valve despite diagnosis of bronchiectasis

## 2019-10-09 NOTE — Assessment & Plan Note (Signed)
Acute on chronic flare of ongoing urinary tract infections Patient has just had 10 days of antibiotic treatment still has persistent chronic UTI symptoms  Plan: Patient to be evaluated later on this week by specialist

## 2019-10-09 NOTE — Assessment & Plan Note (Addendum)
Plan: Trial of Stiolto Respimat Walk today in office, no desats on room air Follow-up in 6 to 8 weeks Follow-up CT chest ordered in 3 months

## 2019-10-09 NOTE — Assessment & Plan Note (Signed)
Plan: Continue follow-up with rheumatology Continue prednisone 7.5 mg daily as managed by rheumatology Continue follow-up in 4 weeks with rheumatology

## 2019-10-09 NOTE — Assessment & Plan Note (Signed)
Plan: Resume flutter valve We'll obtain follow-up CT in 3 months 6 to 8-week follow-up with Dr. Melvyn Novas

## 2019-10-09 NOTE — Progress Notes (Signed)
_0  ID: Heidi Hobbs, female    DOB: [birth year], 79 y.o.   MRN: [id number]  Chief Complaint  Patient presents with  . Follow-up    Pt states that her cough is better since last visit but states that she has been very SOB that she cannot walk any distance longer than about 50 feet.    Referring provider: Shon Baton, MD  HPI:  79 year old female former smoker followed in our office for COPD, cough, bronchiectasis  PMH: CAD, hypertension, vasculitis, Smoker/ Smoking History: Former smoker. Quit 2019. 25 pack year history.  Maintenance:  None  Pt of: Patient of Dr. Melvyn Hobbs  10/09/2019  - Visit   79 year old female former smoker followed in our office for bronchiectasis as well as COPD.  She is presenting for 2-week follow-up.  Cough has improved but still short of breath.  Patient has also completed a repeat CT of her chest.  Those results are listed below:  09/27/2019-CT chest without contrast-pleural-parenchymal nodular thickening of the right lung base coupled with bronchial thickening suggesting chronic inflammatory or infectious process, consider follow-up CT in 3 to 6 months, nodes clear bronchiectasis of the lung base, centrilobular emphysema in upper lobes, coronary artery calcification aortic arthrosclerosis  We will discuss and review this today. We'll tentatively plan on repeating a CT chest in 3 months. Patient continues to report increased shortness of breath. Patient's health is complicated by her polymyalgia rheumatica diagnosis which is been managed by rheumatology. She has been on high-dose steroids for the past couple of months. She is currently down to 7.5 mg daily. She has follow-up with rheumatology in 4 weeks.   Patient also continues to struggle with management of urinary tract infections. She reports that she was treated with 10 days of antibiotics. She still feels like she has a urinary tract infection. She is being seen later on this week for evaluation of  this.  Patient is very concerned regarding her shortness of breath as well as ongoing fatigue. She feels that her health is declined over the last 6 months.  Patient still reporting a cough with yellow mucus. She continues to report nonadherence to using her flutter valve despite countless recommendations from our office regarding how this can help with management of bronchiectasis as well as her chronic cough.  Questionaires / Pulmonary Flowsheets:   ACT:  No flowsheet data found.  MMRC: mMRC Dyspnea Scale mMRC Score  06/08/2019 0    Epworth:  No flowsheet data found.  Tests:    12/20/2018-Rh factor-negative 12/20/2018-ANA-negative 12/20/2018-sed rate-22 1014/2020-CBC with differential-eosinophils absolute 0.1, eosinophils relative 1 11/27/2018-chest x-ray-emphysema, no acute disease  08/31/2017-pulmonary function test-FVC 2.95 (95% predicted), postbronchodilator ratio 68, postbronchodilator FEV1 2.23 (95% predicted) no bronchodilator response, mid flow reversibility, DLCO 16.75 (57% predicted)  10/14/2017-CT chest high-res-moderate centrilobular emphysema and diffuse bronchial wall thickening, no acute consolidative airspace disease or lung masses, scattered mild cylindrical varicoid bronchiectasis throughout both lungs predominantly in bilateral upper lobes and right middle lobe with the associated mild patchy tree-in-bud opacities and scattered mucoid impaction, 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, moderate centrilobular emphysema, patchy lobular air trapping in both lungs indicative of small airways disease,  01/03/2018-CT maxillofacial-clear paranasal sinuses    FENO:  No results found for: NITRICOXIDE  PFT: PFT Results Latest Ref Rng & Units 08/31/2017  FVC-Pre L 2.95  FVC-Predicted Pre % 95  FVC-Post L 3.27  FVC-Predicted Post % 105  Pre FEV1/FVC % % 71  Post FEV1/FCV % %  68  FEV1-Pre L 2.09  FEV1-Predicted Pre  % 90  FEV1-Post L 2.23  DLCO UNC% % 57  DLCO COR %Predicted % 66  TLC L 6.53  TLC % Predicted % 117  RV % Predicted % 136    WALK:  SIX MIN WALK 10/09/2019  Supplimental Oxygen during Test? (L/min) No  Tech Comments: Pt walked at a slow lap completing 1/2 lap having to stop due to weakness in legs and also had complaints of moderate SOB.    Imaging: CT Chest Wo Contrast  Result Date: 09/27/2019 CLINICAL DATA:  SOB with exertion Hx of COPD. Bronchiectasis. Increased cough 4-6 weeks. Former smoker. Dyspnea, chronic, unclear etiology EXAM: CT CHEST WITHOUT CONTRAST TECHNIQUE: Multidetector CT imaging of the chest was performed following the standard protocol without IV contrast. COMPARISON:  10/13/2017 FINDINGS: Cardiovascular: Coronary artery calcification and aortic atherosclerotic calcification. Mediastinum/Nodes: No axillary or supraclavicular adenopathy. No mediastinal hilar adenopathy. No pericardial effusion. Esophagus normal. Lungs/Pleura: Nodular pleuroparenchymal thickening in the medial RIGHT lower lobe measuring 2.1 cm is new from prior. Linear thickening in the inferior RIGHT middle lobe. Centrilobular emphysema the upper lobes. No bronchiectasis. There is mild bronchial thickening in lower lobes Upper Abdomen: Limited view of the liver, kidneys, pancreas are unremarkable. Normal adrenal glands. Musculoskeletal: No aggressive osseous lesion. IMPRESSION: 1. Pleural parenchymal nodular thickening at the RIGHT lung base coupled with bronchial thickening suggestive chronic inflammatory or infectious process. Consider follow-up CT in 3 6 months. 2. Nodes clear bronchiectasis at the lung bases. 3. Centrilobular emphysema the upper lobes. 4. Coronary artery calcification and Aortic Atherosclerosis (ICD10-I70.0). Electronically Signed   By: Suzy Bouchard M.D.   On: 09/27/2019 15:27   XR Cervical Spine 2 or 3 views  Result Date: 10/01/2019 AP lateral cervical spine demonstrates C5-6, C6 6-7  2 level cervical discectomy fusion allograft and plate without lucent lines that would suggest pseudoarthrosis.  No adjacent subluxation is noted. Impression: Satisfactory two-level cervical fusion unchanged from 12/2018  images.   Lab Results:  CBC    Component Value Date/Time   WBC 18.9 (H) 08/06/2019 1334   RBC 5.10 08/06/2019 1334   HGB 15.5 (H) 08/06/2019 1334   HCT 45.7 08/06/2019 1334   PLT 260 08/06/2019 1334   MCV 89.6 08/06/2019 1334   MCH 30.4 08/06/2019 1334   MCHC 33.9 08/06/2019 1334   RDW 16.3 (H) 08/06/2019 1334   LYMPHSABS 1.4 08/06/2019 1334   MONOABS 0.8 08/06/2019 1334   EOSABS 0.0 08/06/2019 1334   BASOSABS 0.1 08/06/2019 1334    BMET    Component Value Date/Time   NA 135 08/14/2019 1500   K 3.7 08/14/2019 1500   CL 94 (L) 08/14/2019 1500   CO2 27 08/14/2019 1500   GLUCOSE 251 (H) 08/14/2019 1500   BUN 11 08/14/2019 1500   CREATININE 0.77 08/14/2019 1500   CALCIUM 8.6 (L) 08/14/2019 1500   GFRNONAA >60 08/14/2019 1500   GFRAA >60 08/14/2019 1500    BNP    Component Value Date/Time   BNP 72.6 08/06/2019 1334    ProBNP No results found for: PROBNP  Specialty Problems      Pulmonary Problems   COPD GOLD 1    Followed in Pulmonary clinic/ [facility]/ [facility]  Quit smoking 07/2017   - PFT's  08/31/2017  FEV1 2.09  (90 % ) ratio 71  p no % improvement from saba p nothing  prior to study with DLCO  57 % corrects to 66 % for alv  volume          Bronchiectasis without acute exacerbation ([place])    Followed in Pulmonary clinic/ [facility]/ [facility]  Cough since 1980   01/28/12 CT chest:  Mild bronchiectatic change in the right middle lobe and  lingula. In view of these changes, MAC would be a consideration - HRCT 10/13/2017  1. Scattered mild cylindrical and varicoid bronchiectasis, tree-in-bud opacities and mucoid impaction in both lungs, predominantly in the upper lobes and right middle lobe, mildly progressed since 10/06/2016 chest  CT, particularly in anterior left upper lobe. 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 2. Moderate centrilobular emphysema with diffuse bronchial wall thickening, suggesting COPD. - Sinus CT 01/02/18 > Clear paranasal sinuses - 01/04/2018 added prn augmentin for flares  - 04/07/2018 added flutter, stopped macrodantin  - flare since late feb 2021 refractory to abx and pred  > 08/09/2019 acute eval rec cyclical cough rx       Cough    Followed in Pulmonary clinic/ [facility]/ [facility]           Allergies  Allergen Reactions  . Eggs Or Egg-Derived Products     Skin nodules    Immunization History  Administered Date(s) Administered  . Influenza Whole 12/14/2011, 12/13/2016  . Influenza, High Dose Seasonal PF 11/21/2017  . PFIZER SARS-COV-2 Vaccination 04/23/2019, 05/21/2019  . Tdap 10/16/2011    Past Medical History:  Diagnosis Date  . Adenomatous colon polyp 1995, 2011  . Anemia   . Anxiety   . Bronchiectasis ([place])   . Cancer [facility])    uterine cancer  . Carotid stenosis    40-59% right, less than 40% left  . Cataracts, bilateral   . COPD (chronic obstructive pulmonary disease) ([place])   . Dyspnea   . Essential hypertension 02/13/2016  . Hyperlipidemia   . Hypertension   . MAC (mycobacterium avium-intracellulare complex)   . Macular degeneration, dry 02/13/2016  . Polio 1959  . Stomach ulcer 1960  . Urinary tract infection   . 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 Vasculitis ([place]) 02/13/2016   Followed by [facility]     Tobacco History: Social History   Tobacco Use  Smoking Status Former Smoker  . Packs/day: 0.50  . Years: 50.00  . Pack years: 25.00  . Types: Cigarettes  . Quit date: 08/01/2017  . Years since quitting: 2.1  Smokeless Tobacco Never Used   Counseling given: Not Answered   Continue to not smoke  Outpatient Encounter Medications as of 10/09/2019   Medication Sig  . albuterol (PROVENTIL) (2.5 MG/3ML) 0.083% nebulizer solution Take 3 mLs (2.5 mg total) by nebulization every 6 (six) hours as needed for wheezing or shortness of breath.  Marland Kitchen albuterol (VENTOLIN HFA) 108 (90 Base) MCG/ACT inhaler Inhale 2 puffs into the lungs every 6 (six) hours as needed for wheezing or shortness of breath.  . ALPRAZolam (XANAX) 0.25 MG tablet Take 0.25 tablets by mouth daily as needed for anxiety.   Marland Kitchen amitriptyline (ELAVIL) 100 MG tablet Take 100 mg by mouth at bedtime.   Marland Kitchen amLODipine (NORVASC) 5 MG tablet Take 5 mg by mouth daily.   Marland Kitchen atenolol (TENORMIN) 50 MG tablet Take 50 mg by mouth daily.  Marland Kitchen atorvastatin (LIPITOR) 40 MG tablet Take 40 mg by mouth every other day. At night  . cholecalciferol (VITAMIN D3) 25 MCG (1000 UT) tablet Take 1,000 Units by mouth daily.  Marland Kitchen docusate sodium (COLACE) 100  MG capsule You can buy this at any drug store, over the counter and follow package instructions (Patient taking differently: Take 100 mg by mouth daily. )  . EPINEPHrine 0.3 mg/0.3 mL IJ SOAJ injection Inject 0.3 mLs into the muscle once as needed for anaphylaxis.   Marland Kitchen ezetimibe (ZETIA) 10 MG tablet Take 10 mg by mouth at bedtime.   . famotidine (PEPCID) 20 MG tablet TAKE 1 TABLET BY MOUTH EVERY DAY  . hydrochlorothiazide (HYDRODIURIL) 25 MG tablet Take 25 mg by mouth daily.  Marland Kitchen losartan (COZAAR) 100 MG tablet Take 100 mg by mouth daily.  Marland Kitchen omeprazole (PRILOSEC OTC) 20 MG tablet TAKE 1 TABLET BY MOUTH EVERYDAY AT BEDTIME  . predniSONE (DELTASONE) 5 MG tablet Take 10 mg by mouth daily.  . Probiotic CAPS Take 1 capsule by mouth daily.  Marland Kitchen Respiratory Therapy Supplies (FLUTTER) DEVI 1 Device by Does not apply route 2 (two) times daily.  . traMADol (ULTRAM) 50 MG tablet SMARTSIG:1 Tablet(s) By Mouth Every 12 Hours PRN  . VENTOLIN HFA 108 (90 Base) MCG/ACT inhaler INHALE 1 PUFF INTO LUNGS EVERY 6 HOURS AS NEEDED FOR WHEEZING FOR SHORTNESS OF BREATH  . vitamin B-12  (CYANOCOBALAMIN) 500 MCG tablet Take 500 mcg by mouth daily.  . Tiotropium Bromide-Olodaterol (STIOLTO RESPIMAT) 2.5-2.5 MCG/ACT AERS Inhale 2 puffs into the lungs daily.   Facility-Administered Encounter Medications as of 10/09/2019  Medication  . diclofenac sodium (VOLTAREN) 1 % transdermal gel 4 g     Review of Systems  Review of Systems  Constitutional: Positive for fatigue. Negative for activity change and fever.  HENT: Negative for sinus pressure, sinus pain and sore throat.   Respiratory: Positive for cough and shortness of breath. Negative for wheezing.   Cardiovascular: Negative for chest pain and palpitations.  Gastrointestinal: Negative for diarrhea, nausea and vomiting.  Genitourinary: Positive for difficulty urinating, dysuria and urgency.  Musculoskeletal: Negative for arthralgias.  Neurological: Negative for dizziness.  Psychiatric/Behavioral: Negative for sleep disturbance. The patient is not nervous/anxious.      Physical Exam  BP (!) 118/60 (BP Location: Right Arm, Cuff Size: Normal)   Pulse 84   Ht _0  (1.727 m)   Wt 163 lb 12.8 oz (74.3 kg)   SpO2 93%   BMI 24.91 kg/m   Wt Readings from Last 5 Encounters:  10/09/19 163 lb 12.8 oz (74.3 kg)  09/25/19 160 lb (72.6 kg)  09/18/19 160 lb (72.6 kg)  08/17/19 160 lb 7.9 oz (72.8 kg)  08/09/19 159 lb 6.4 oz (72.3 kg)    BMI Readings from Last 5 Encounters:  10/09/19 24.91 kg/m  09/25/19 24.33 kg/m  09/18/19 24.33 kg/m  08/17/19 24.40 kg/m  08/09/19 24.24 kg/m     Physical Exam Vitals and nursing note reviewed.  Constitutional:      General: She is not in acute distress.    Appearance: Normal appearance. She is normal weight.     Comments: Chronically ill adult female  HENT:     Head: Normocephalic and atraumatic.     Right Ear: External ear normal.     Left Ear: External ear normal.     Nose: Nose normal. No congestion.     Mouth/Throat:     Mouth: Mucous membranes are moist.      Pharynx: Oropharynx is clear.  Eyes:     Pupils: Pupils are equal, round, and reactive to light.  Cardiovascular:     Rate and Rhythm: Normal rate and regular rhythm.  Pulses: Normal pulses.     Heart sounds: Normal heart sounds. No murmur heard.   Pulmonary:     Breath sounds: No decreased air movement. No decreased breath sounds, wheezing or rales.  Musculoskeletal:     Cervical back: Normal range of motion.  Skin:    General: Skin is warm and dry.     Capillary Refill: Capillary refill takes less than 2 seconds.  Neurological:     General: No focal deficit present.     Mental Status: She is alert and oriented to person, place, and time. Mental status is at baseline.     Gait: Gait abnormal.  Psychiatric:        Mood and Affect: Mood normal.        Behavior: Behavior normal.        Thought Content: Thought content normal.        Judgment: Judgment normal.       Assessment & Plan:   Bronchiectasis without acute exacerbation (HCC) Plan: Resume flutter valve We'll obtain follow-up CT in 3 months 6 to 8-week follow-up with Dr. Melvyn Hobbs   COPD GOLD 1 Plan: Trial of Stiolto Respimat Walk today in office, no desats on room air Follow-up in 6 to 8 weeks Follow-up CT chest ordered in 3 months  Cough Currently improved Complicated due to nonadherence of patient's flutter valve despite diagnosis of bronchiectasis  Polymyalgia rheumatica ([place]) Plan: Continue follow-up with rheumatology Continue prednisone 7.5 mg daily as managed by rheumatology Continue follow-up in 4 weeks with rheumatology  Dysuria Acute on chronic flare of ongoing urinary tract infections Patient has just had 10 days of antibiotic treatment still has persistent chronic UTI symptoms  Plan: Patient to be evaluated later on this week by specialist    Return in about 6 weeks (around 11/20/2019), or if symptoms worsen or fail to improve, for Follow up with Dr. Melvyn Hobbs.   [name],  NP 10/09/2019   This appointment required 32 minutes of patient care (this includes precharting, chart review, review of results, face-to-face care, etc.).

## 2019-10-11 DIAGNOSIS — N390 Urinary tract infection, site not specified: Secondary | ICD-10-CM | POA: Diagnosis not present

## 2019-10-11 DIAGNOSIS — N952 Postmenopausal atrophic vaginitis: Secondary | ICD-10-CM | POA: Diagnosis not present

## 2019-10-11 DIAGNOSIS — D849 Immunodeficiency, unspecified: Secondary | ICD-10-CM | POA: Diagnosis not present

## 2019-10-12 DIAGNOSIS — M6281 Muscle weakness (generalized): Secondary | ICD-10-CM | POA: Diagnosis not present

## 2019-10-12 DIAGNOSIS — R519 Headache, unspecified: Secondary | ICD-10-CM | POA: Diagnosis not present

## 2019-10-12 DIAGNOSIS — E7849 Other hyperlipidemia: Secondary | ICD-10-CM | POA: Diagnosis not present

## 2019-10-12 DIAGNOSIS — M315 Giant cell arteritis with polymyalgia rheumatica: Secondary | ICD-10-CM | POA: Diagnosis not present

## 2019-10-12 DIAGNOSIS — R627 Adult failure to thrive: Secondary | ICD-10-CM | POA: Diagnosis not present

## 2019-10-23 DIAGNOSIS — E785 Hyperlipidemia, unspecified: Secondary | ICD-10-CM | POA: Diagnosis not present

## 2019-11-13 DIAGNOSIS — M545 Low back pain: Secondary | ICD-10-CM | POA: Diagnosis not present

## 2019-11-13 DIAGNOSIS — M150 Primary generalized (osteo)arthritis: Secondary | ICD-10-CM | POA: Diagnosis not present

## 2019-11-13 DIAGNOSIS — M353 Polymyalgia rheumatica: Secondary | ICD-10-CM | POA: Diagnosis not present

## 2019-11-13 DIAGNOSIS — M546 Pain in thoracic spine: Secondary | ICD-10-CM | POA: Diagnosis not present

## 2019-11-15 DIAGNOSIS — H04123 Dry eye syndrome of bilateral lacrimal glands: Secondary | ICD-10-CM | POA: Diagnosis not present

## 2019-11-15 DIAGNOSIS — Z961 Presence of intraocular lens: Secondary | ICD-10-CM | POA: Diagnosis not present

## 2019-11-15 DIAGNOSIS — M316 Other giant cell arteritis: Secondary | ICD-10-CM | POA: Diagnosis not present

## 2019-11-15 DIAGNOSIS — H353221 Exudative age-related macular degeneration, left eye, with active choroidal neovascularization: Secondary | ICD-10-CM | POA: Diagnosis not present

## 2019-11-29 ENCOUNTER — Other Ambulatory Visit: Payer: Self-pay

## 2019-11-29 ENCOUNTER — Ambulatory Visit (INDEPENDENT_AMBULATORY_CARE_PROVIDER_SITE_OTHER): Payer: Medicare Other | Admitting: Internal Medicine

## 2019-11-29 ENCOUNTER — Encounter: Payer: Self-pay | Admitting: Internal Medicine

## 2019-11-29 DIAGNOSIS — J479 Bronchiectasis, uncomplicated: Secondary | ICD-10-CM | POA: Diagnosis not present

## 2019-11-29 NOTE — Patient Instructions (Addendum)
For cough > mucinex dm up to 1200mg  every 12 hours with flutter valve as much as possible    Please schedule a follow up visit in 6  months but call sooner if needed

## 2019-11-29 NOTE — Progress Notes (Signed)
Subjective:   Patient ID: Heidi Hobbs, female    DOB: Nov 07, 1940     MRN: [id number]   Brief patient profile:  66 yowf quit smoking 07/2017 with  Chronic cough since 1980 limited by sob then breathing worse in April 2014 while being being treated by  Dr Fredderick Phenix  For hives, rx with prn Ventolin but didn't help resting sob and dx with bronchiectasis and GOLD I copd criteria 09/27/12   H/o Exposure to chlorine gas in 1980's did not required hospitalization > w/in  Few years started getting recurrent pna  Stopped taking daily macrodantin around 03/2017  Has immunodef f/u by [facility]   History of Present Illness  08/17/2012 1st pulmonary cc indolent cough x sev years better when stops smoking, not limited by breathing. Cough is mostly clear more p shower, and in ams also but doesn't disturb sleep. rec Mucinex or mucinex dm prn     12/05/2017  f/u ov/Heidi Hobbs re: COPD GOLD I/ bronchiectasis  Chief Complaint  Patient presents with  . Follow-up    went to UC approx 4 wks ago and was told she had PNA-txed with pred and doxy. She states her breathing is doing okay. She is having some left side back pain. She rareley uses her albuterol.   Dyspnea:  MMRC2 = can't walk a nl pace on a flat grade s sob but does fine slow and flat  Cough: variable cough yellow and green better doxy with assoc sinus congestion, some worse since stopped doxy as is  assoc L post cp  Sleeping: one pillow bed flat  SABA use: rarely   rec Augmentin 875 mg take one pill twice daily  X 10 days - take at breakfast and supper with large glass of water.  It would help reduce the usual side effects (diarrhea and yeast infections) if you ate cultured yogurt at lunch.  Bronchiectasis =   you have scarring of your bronchial tubes  Whenever you develop cough congestion take mucinex or mucinex dm  1200 mg every 12 hours > these will help keep the mucus loose  sinus CT in 2 weeks ->>> all clear   01/04/2018  f/u ov/Heidi Hobbs re:  bronchiectasis / better p course of augmentin  Chief Complaint  Patient presents with  . Follow-up    Cough has improved  some.  She occ produces some yellow sputum.  She rarely uses her albuterol inhaler.   Dyspnea:  mmrc2 Cough: first thing in am  Sleeping: 2 pillow /flat bed  SABA use: rarely  02: none  rec For cough / congestion >  mucinex or mucinex dm up to 1200 mg every 12 hours as needed For nasty mucus  >  Augmentin 875 mg take one pill twice daily  X 10 days - take at breakfast and supper with large glass of water.  It would help reduce the usual side effects (diarrhea and yeast infections) if you ate cultured yogurt at lunch.     04/07/2018  f/u ov/Heidi Hobbs re: bronchiectasis / just started back on macrodantin Chief Complaint  Patient presents with  . Follow-up    Breathing is doing well. She is coughing alot at night- admits to eating alot of chocolate at night.   Dyspnea:  MMRC2 = can't walk a nl pace on a flat grade s sob but does fine slow and flat ht ok  Cough: worse at hs and in am eating lots of kisses at bedside Sleeping: flat bed  SABA use:  none  02: none  rec Cipro 500 mg twice daily with meals x one week and stop the macrodantin  Cough > mucinex 1200 mg every 12 hours as needed and flutter valve as much as possible  6-8 in bed blocks very important - see below   GERD    06/08/19  Flare x 4-6 weeks > eval Heidi Edelman NP  rx doxy  07/30/19  Flare rx Heidi Hobbs another doxy  07/17/19 ER cxr > atx bases   08/09/2019  Acute extended  ov/Heidi Hobbs re: still flare of coughing with dx bronchiectasis / ? Giant cell arthritis for bx in  8 days  Chief Complaint  Patient presents with  . Acute Visit    pt is coughing sob, pt coughing up claer mucus  Dyspnea:  Mostly when coughing  Cough: severe 24/7 coughing fits with minimal clear mucus production despite on high dose pred for ? PMR  Taking ppi at hs and h2 in am  Only relief she gets from cough is p takes tramadol but only uses once  or twice a day rec Change :  Try prilosec otc 20mg   Take 30-60 min before first meal of the day and Pepcid ac (famotidine) 20 mg one @  Bedtime  6-8 inch bed blocks  For cough mucinex dm up to 1200 mg every 12 hours and use flutter valve as much as possible when coughing  If still coughing, do the above plus add the tramadol 50 mg every 4 hours as needed For shortness of breath > albuterol neb or spray up to every 4 hours as needed  GERD diet   For mouth sore > clotrimazole troche 10 mg up to 5 x daily     11/29/2019  f/u ov/Heidi Hobbs re: bronchiectasis  Chief Complaint  Patient presents with  . Follow-up    Cough, GERD and Bronchiectasis, some SOB with activities.  Dyspnea:  Able to walk around house now / pred 5 mg daily  Cough: not much cough / using flutter sparingly Sleeping: 45 degrees electric bed SABA use: qod / rarely neb  02: none   No obvious day to day or daytime variability or assoc excess/ purulent sputum or mucus plugs or hemoptysis or cp or chest tightness, subjective wheeze or overt sinus or hb symptoms.   Sleeping  without nocturnal  or early am exacerbation  of respiratory  c/o's or need for noct saba. Also denies any obvious fluctuation of symptoms with weather or environmental changes or other aggravating or alleviating factors except as outlined above   No unusual exposure hx or h/o childhood pna/ asthma or knowledge of premature birth.  Current Allergies, Complete Past Medical History, Past Surgical History, Family History, and Social History were reviewed in [employer] record.  ROS  The following are not active complaints unless bolded Hoarseness, sore throat, dysphagia, dental problems, itching, sneezing,  nasal congestion or discharge of excess mucus or purulent secretions, ear ache,   fever, chills, sweats, unintended wt loss or wt gain, classically pleuritic or exertional cp,  orthopnea pnd or arm/hand swelling  or leg swelling,  presyncope, palpitations, abdominal pain, anorexia, nausea, vomiting, diarrhea  or change in bowel habits or change in bladder habits, change in stools or change in urine, dysuria, hematuria,  rash, arthralgias, visual complaints, headache, numbness, weakness or ataxia or problems with walking or coordination,  change in mood or  memory.        Current Meds  Medication Sig  .  albuterol (PROVENTIL) (2.5 MG/3ML) 0.083% nebulizer solution Take 3 mLs (2.5 mg total) by nebulization every 6 (six) hours as needed for wheezing or shortness of breath.  Marland Kitchen albuterol (VENTOLIN HFA) 108 (90 Base) MCG/ACT inhaler Inhale 2 puffs into the lungs every 6 (six) hours as needed for wheezing or shortness of breath.  . ALPRAZolam (XANAX) 0.25 MG tablet Take 0.25 tablets by mouth daily as needed for anxiety.   Marland Kitchen amitriptyline (ELAVIL) 100 MG tablet Take 100 mg by mouth at bedtime.   Marland Kitchen amLODipine (NORVASC) 5 MG tablet Take 5 mg by mouth daily.   Marland Kitchen atenolol (TENORMIN) 50 MG tablet Take 50 mg by mouth daily.  Marland Kitchen atorvastatin (LIPITOR) 40 MG tablet Take 40 mg by mouth every other day. At night  . cholecalciferol (VITAMIN D3) 25 MCG (1000 UT) tablet Take 1,000 Units by mouth daily.  Marland Kitchen docusate sodium (COLACE) 100 MG capsule You can buy this at any drug store, over the counter and follow package instructions (Patient taking differently: Take 100 mg by mouth daily. )  . EPINEPHrine 0.3 mg/0.3 mL IJ SOAJ injection Inject 0.3 mLs into the muscle once as needed for anaphylaxis.   Marland Kitchen ezetimibe (ZETIA) 10 MG tablet Take 10 mg by mouth at bedtime.   . famotidine (PEPCID) 20 MG tablet TAKE 1 TABLET BY MOUTH EVERY DAY  . hydrochlorothiazide (HYDRODIURIL) 25 MG tablet Take 25 mg by mouth daily.  Marland Kitchen losartan (COZAAR) 100 MG tablet Take 100 mg by mouth daily.  Marland Kitchen omeprazole (PRILOSEC OTC) 20 MG tablet TAKE 1 TABLET BY MOUTH EVERYDAY AT BEDTIME  . predniSONE (DELTASONE) 5 MG tablet Take 10 mg by mouth daily.  . Probiotic CAPS Take 1 capsule  by mouth daily.  Marland Kitchen Respiratory Therapy Supplies (FLUTTER) DEVI 1 Device by Does not apply route 2 (two) times daily.  . traMADol (ULTRAM) 50 MG tablet SMARTSIG:1 Tablet(s) By Mouth Every 12 Hours PRN  . VENTOLIN HFA 108 (90 Base) MCG/ACT inhaler INHALE 1 PUFF INTO LUNGS EVERY 6 HOURS AS NEEDED FOR WHEEZING FOR SHORTNESS OF BREATH  . vitamin B-12 (CYANOCOBALAMIN) 500 MCG tablet Take 500 mcg by mouth daily.   Current Facility-Administered Medications for the 11/29/19 encounter (Office Visit) with Tanda Rockers, MD  Medication  . diclofenac sodium (VOLTAREN) 1 % transdermal gel 4 g                Objective:   Physical Exam   11/29/2019         161   08/09/2019        159  04/07/2018        156  01/04/2018      153  12/05/2017        150  08/31/2017        154  07/22/2017        153   08/17/12 157 lb 9.6 oz (71.487 kg)  05/04/11 156 lb (70.761 kg)  04/20/11 156 lb 6.4 oz (70.943 kg)      W/c bound elderly wf nad    Vital signs reviewed  11/29/2019  - Note at rest 02 sats  95% on RA      HEENT: Full dentures    HEENT : pt wearing mask not removed for exam due to covid -19 concerns.    NECK :  without JVD/Nodes/TM/ nl carotid upstrokes bilaterally   LUNGS: no acc muscle use, slt kyphotic contour chest with a few pops/squeaks on insp  bilaterally without cough on  insp or exp maneuvers   CV:  RRR  no s3 or murmur or increase in P2, and no edema   ABD:  soft and nontender with nl inspiratory excursion in the supine position. No bruits or organomegaly appreciated, bowel sounds nl  MS:  Nl gait/ ext warm without deformities, calf tenderness, cyanosis or clubbing No obvious joint restrictions   SKIN: warm and dry without lesions    NEURO:  alert, approp, nl sensorium with  no motor or cerebellar deficits apparent.         I personally reviewed images and agree with radiology impression as follows:   Chest CT  09/26/19  1. Pleural parenchymal nodular thickening at the RIGHT  lung base coupled with bronchial thickening suggestive chronic inflammatory or infectious process. Consider follow-up CT in 3 6 months. 2. Nodes clear bronchiectasis at the lung bases. 3. Centrilobular emphysema the upper lobes.    Assessment & Plan:

## 2019-11-30 ENCOUNTER — Encounter: Payer: Self-pay | Admitting: Internal Medicine

## 2019-11-30 NOTE — Assessment & Plan Note (Signed)
Cough since 1980   01/28/12 CT chest:  Mild bronchiectatic change in the right middle lobe and  lingula. In view of these changes, MAC would be a consideration - HRCT 10/13/2017  1. Scattered mild cylindrical and varicoid bronchiectasis, tree-in-bud opacities and mucoid impaction in both lungs, predominantly in the upper lobes and right middle lobe, mildly progressed since 10/06/2016 chest CT, particularly in anterior left upper lobe. 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 2. Moderate centrilobular emphysema with diffuse bronchial wall thickening, suggesting COPD. - Sinus CT 01/02/18 > Clear paranasal sinuses - 01/04/2018 added prn augmentin for flares  - 04/07/2018 added flutter, stopped macrodantin  - flare since late feb 2021 refractory to abx and pred  > 08/09/2019 acute eval rec cyclical cough rx > resolved   Adequate control on present rx, reviewed in detail with pt > no change in rx needed    F/u q 6 mon         Each maintenance medication was reviewed in detail including emphasizing most importantly the difference between maintenance and prns and under what circumstances the prns are to be triggered using an action plan format where appropriate.  Total time for H and P, chart review, counseling, and generating customized AVS unique to this office visit / charting = 15 min

## 2019-12-11 DIAGNOSIS — R627 Adult failure to thrive: Secondary | ICD-10-CM | POA: Diagnosis not present

## 2019-12-11 DIAGNOSIS — J449 Chronic obstructive pulmonary disease, unspecified: Secondary | ICD-10-CM | POA: Diagnosis not present

## 2019-12-11 DIAGNOSIS — I2721 Secondary pulmonary arterial hypertension: Secondary | ICD-10-CM | POA: Diagnosis not present

## 2019-12-11 DIAGNOSIS — E099 Drug or chemical induced diabetes mellitus without complications: Secondary | ICD-10-CM | POA: Diagnosis not present

## 2019-12-20 ENCOUNTER — Telehealth: Payer: Self-pay | Admitting: Pulmonary Disease

## 2019-12-20 NOTE — Telephone Encounter (Signed)
Happy doing what ever the patient would prefer.  It is perfectly reasonable though for [place] to review the CT scan and simply call her with the recommendations.  After the CT scan if there are concerns or needs for any close follow-up we can always coordinate this.  I am okay with the plan of care that Dr. Melvyn Novas has outlined.Would recommend proceeding forward with CT scan as ordered.Wyn Quaker, FNP

## 2019-12-20 NOTE — Telephone Encounter (Signed)
Spoke with pt's daughter, Lynelle Smoke. They want to know if [name] would like to see the pt for an OV after her CT scan is done later this month. At her OV with MW in September, he told her to follow up in 6 months.  [name] - please advise if you want to see the pt. Thanks.

## 2019-12-20 NOTE — Telephone Encounter (Signed)
Spoke with pt's daughter, Lynelle Smoke. She is aware of Brian's response. Pt would prefer to come in and see [name] after the CT. OV has been scheduled for 12/31/19 at 1600. Nothing further was needed.

## 2019-12-26 ENCOUNTER — Ambulatory Visit (INDEPENDENT_AMBULATORY_CARE_PROVIDER_SITE_OTHER)
Admission: RE | Admit: 2019-12-26 | Discharge: 2019-12-26 | Disposition: A | Discharge status: Home / Self Care | Payer: Medicare Other | Source: Ambulatory Visit | Attending: Pulmonary Disease | Admitting: Pulmonary Disease

## 2019-12-26 ENCOUNTER — Other Ambulatory Visit: Payer: Self-pay

## 2019-12-26 DIAGNOSIS — R059 Cough, unspecified: Secondary | ICD-10-CM | POA: Diagnosis not present

## 2019-12-26 DIAGNOSIS — J449 Chronic obstructive pulmonary disease, unspecified: Secondary | ICD-10-CM | POA: Diagnosis not present

## 2019-12-26 DIAGNOSIS — J479 Bronchiectasis, uncomplicated: Secondary | ICD-10-CM | POA: Diagnosis not present

## 2019-12-26 DIAGNOSIS — R0602 Shortness of breath: Secondary | ICD-10-CM | POA: Diagnosis not present

## 2019-12-31 ENCOUNTER — Other Ambulatory Visit: Payer: Self-pay

## 2019-12-31 ENCOUNTER — Ambulatory Visit (INDEPENDENT_AMBULATORY_CARE_PROVIDER_SITE_OTHER): Payer: Medicare Other | Admitting: Pulmonary Disease

## 2019-12-31 ENCOUNTER — Encounter: Payer: Self-pay | Admitting: Pulmonary Disease

## 2019-12-31 VITALS — BP 144/70 | HR 83 | Temp 98.1°F | Ht 68.0 in | Wt 156.4 lb

## 2019-12-31 DIAGNOSIS — H353190 Nonexudative age-related macular degeneration, unspecified eye, stage unspecified: Secondary | ICD-10-CM | POA: Diagnosis not present

## 2019-12-31 DIAGNOSIS — I129 Hypertensive chronic kidney disease with stage 1 through stage 4 chronic kidney disease, or unspecified chronic kidney disease: Secondary | ICD-10-CM | POA: Diagnosis not present

## 2019-12-31 DIAGNOSIS — K219 Gastro-esophageal reflux disease without esophagitis: Secondary | ICD-10-CM

## 2019-12-31 DIAGNOSIS — R519 Headache, unspecified: Secondary | ICD-10-CM | POA: Diagnosis not present

## 2019-12-31 DIAGNOSIS — H539 Unspecified visual disturbance: Secondary | ICD-10-CM

## 2019-12-31 DIAGNOSIS — J479 Bronchiectasis, uncomplicated: Secondary | ICD-10-CM

## 2019-12-31 DIAGNOSIS — E871 Hypo-osmolality and hyponatremia: Secondary | ICD-10-CM | POA: Diagnosis not present

## 2019-12-31 DIAGNOSIS — J449 Chronic obstructive pulmonary disease, unspecified: Secondary | ICD-10-CM | POA: Diagnosis not present

## 2019-12-31 DIAGNOSIS — N182 Chronic kidney disease, stage 2 (mild): Secondary | ICD-10-CM | POA: Diagnosis not present

## 2019-12-31 DIAGNOSIS — M353 Polymyalgia rheumatica: Secondary | ICD-10-CM

## 2019-12-31 NOTE — Assessment & Plan Note (Signed)
Patient currently maintained on PPI as well as H2 blocker Patient would like to transition off of these medications if at all possible  Plan: Patient to trial coming off of famotidine Continue PPI at this time

## 2019-12-31 NOTE — Assessment & Plan Note (Signed)
Started on [employer], she did not notice any improvement next Plan: We will continue to clinically monitor 12-month follow-up with Dr. Melvyn Novas

## 2019-12-31 NOTE — Assessment & Plan Note (Signed)
Plan: Continue prednisone Continue follow-up with rheumatology Recommend that you contact rheumatology regarding your worsening symptoms of PMR

## 2019-12-31 NOTE — Assessment & Plan Note (Signed)
Patient feels that her vision has worsened significantly over the last 3 to 6 months She is unsure if she has ophthalmology follow-up She reports she sees a retinal specialist  Plan: Encourage patient to follow-up with her retinal specialist as well as to establish care with ophthalmologist Ambulatory referral to ophthalmology placed today

## 2019-12-31 NOTE — Progress Notes (Signed)
_0  ID: Heidi Hobbs, female    DOB: 05-23-1940, 79 y.o.   MRN: [id number]  Chief Complaint  Patient presents with  . Follow-up    CT 10/13, shortness of breath can't walk far or do much is not sure if its from her lungs or her legs being weak    Referring provider: Shon Baton, MD  HPI:  79 year old female former smoker followed in our office for COPD, cough, bronchiectasis  PMH: CAD, hypertension, vasculitis, Smoker/ Smoking History: Former smoker. Quit 2019. 25 pack year history.  Maintenance:  None  Pt of: Dr. Melvyn Novas  12/31/2019  - Visit   79 year old female former smoker followed in our office for COPD, chronic cough and bronchiectasis.  Patient completing follow-up with our office from last visit in September/2021 with Dr. Melvyn Novas.  Was at that time recommended for the patient to follow-up in 6 months.  She was encouraged to start Mucinex.  Patient complete a repeat CT chest in October/2021.  She presents today to review those results.  Patient reports that since last being seen she has been dealing with elevated blood pressure.  She was seen by primary care today and hydrochlorothiazide was added.  She also continues to be followed by rheumatology and is maintained on prednisone 3 mg daily.  She feels that she is having worsening symptoms of her PMR.  She has not followed up with rheumatology yet regarding this.  She reports her next follow-up visit next month.    She is also having vision changes.  She has not yet obtained a eye exam for management of these.  She reports baseline macular degeneration.  She is unsure if this is worsened.  She is not sure if she needs to follow-up with an ophthalmologist or her retinal specialist.  We will discuss this today.  Patient reports adherence to using her flutter valve although some days she only uses it 1 time.  She knows that she should be using at least 2 times daily.  We will review this today.   Tests:    12/20/2018-Rh  factor-negative 12/20/2018-ANA-negative 12/20/2018-sed rate-22 1014/2020-CBC with differential-eosinophils absolute 0.1, eosinophils relative 1 11/27/2018-chest x-ray-emphysema, no acute disease  08/31/2017-pulmonary function test-FVC 2.95 (95% predicted), postbronchodilator ratio 68, postbronchodilator FEV1 2.23 (95% predicted) no bronchodilator response, mid flow reversibility, DLCO 16.75 (57% predicted)  10/14/2017-CT chest high-res-moderate centrilobular emphysema and diffuse bronchial wall thickening, no acute consolidative airspace disease or lung masses, scattered mild cylindrical varicoid bronchiectasis throughout both lungs predominantly in bilateral upper lobes and right middle lobe with the associated mild patchy tree-in-bud opacities and scattered mucoid impaction, 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, moderate centrilobular emphysema, patchy lobular air trapping in both lungs indicative of small airways disease,  01/03/2018-CT maxillofacial-clear paranasal sinuses  09/27/2019-CT chest without contrast-pleural-parenchymal nodular thickening in the right lung base, coupled with bronchial thickening suggestive of chronic inflammatory or infectious process, consider follow-up CT in 3 to 6 months, nodes clear bronchiectasis at lung bases, centrilobular emphysema  12/26/2019-CT chest without contrast-airway plugging the right lower lobe and lingula with some of the airspace opacity in the right lower lobe has resolved compared to prior exam, also the degree of mucous plugging in the right lower lobe is mildly improved from prior although not resolved, stable chronic tiny scattered bilateral pulmonary nodules less than 0.5 cm average size, considered benign, centrilobular emphysema    FENO:  No results found for: NITRICOXIDE  PFT: PFT Results Latest Ref Rng &  Units 08/31/2017  FVC-Pre L 2.95  FVC-Predicted Pre % 95  FVC-Post L 3.27   FVC-Predicted Post % 105  Pre FEV1/FVC % % 71  Post FEV1/FCV % % 68  FEV1-Pre L 2.09  FEV1-Predicted Pre % 90  FEV1-Post L 2.23  DLCO uncorrected ml/min/mmHg 16.75  DLCO UNC% % 57  DLVA Predicted % 66  TLC L 6.53  TLC % Predicted % 117  RV % Predicted % 136    WALK:  SIX MIN WALK 10/09/2019  Supplimental Oxygen during Test? (L/min) No  Tech Comments: Pt walked at a slow lap completing 1/2 lap having to stop due to weakness in legs and also had complaints of moderate SOB.    Imaging: CT Chest Wo Contrast  Result Date: 12/28/2019 CLINICAL DATA:  Cough. Bronchiectasis and COPD. Shortness of breath. EXAM: CT CHEST WITHOUT CONTRAST TECHNIQUE: Multidetector CT imaging of the chest was performed following the standard protocol without IV contrast. COMPARISON:  12/27/2019 FINDINGS: Cardiovascular: Coronary, aortic arch, and branch vessel atherosclerotic vascular disease. Borderline prominence of the main pulmonary artery. Mediastinum/Nodes: The 0.8 cm right hilar lymph node, within normal size limits. Lungs/Pleura: Stable biapical pleuroparenchymal scarring. Centrilobular emphysema. Stable tiny scattered bilateral pulmonary nodules, generally less than 0.5 cm in average size. Airway plugging in the right lower lobe. Mild cylindrical bronchiectasis in the lower lobes. Mild airway plugging in the lingula. Mild scarring in the right lower lobe and medially in the right middle lobe. Some of the airspace opacity in volume loss medially in the right lower lobe has resolved compared to the prior exam. Also the degree of mucous plugging in right lower lobe bronchi is mildly improved from prior although not resolved. Upper Abdomen: Aortoiliac atherosclerotic vascular disease. Bilateral adrenal adenomas. Musculoskeletal: Lower cervical plate and screw fixator. Subacute superior endplate compression fracture at T4, unchanged. IMPRESSION: 1. Airway plugging in the right lower lobe and lingula, with some of the  airspace opacity in the right lower lobe has resolved compared to the prior exam. Also the degree of mucous plugging in right lower lobe bronchi is mildly improved from prior although not resolved. 2. Stable chronic tiny scattered bilateral pulmonary nodules, generally less than 0.5 cm in average size, considered benign. 3. Centrilobular emphysema. 4. Coronary, aortic arch, and branch vessel atherosclerotic vascular disease. 5. Bilateral adrenal adenomas. 6. Subacute superior endplate compression fracture at T4, unchanged. 7. Emphysema and aortic atherosclerosis. Aortic Atherosclerosis (ICD10-I70.0) and Emphysema (ICD10-J43.9). Electronically Signed   By: Van Clines M.D.   On: 12/28/2019 19:31    Lab Results:  CBC    Component Value Date/Time   WBC 18.9 (H) 08/06/2019 1334   RBC 5.10 08/06/2019 1334   HGB 15.5 (H) 08/06/2019 1334   HCT 45.7 08/06/2019 1334   PLT 260 08/06/2019 1334   MCV 89.6 08/06/2019 1334   MCH 30.4 08/06/2019 1334   MCHC 33.9 08/06/2019 1334   RDW 16.3 (H) 08/06/2019 1334   LYMPHSABS 1.4 08/06/2019 1334   MONOABS 0.8 08/06/2019 1334   EOSABS 0.0 08/06/2019 1334   BASOSABS 0.1 08/06/2019 1334    BMET    Component Value Date/Time   NA 135 08/14/2019 1500   K 3.7 08/14/2019 1500   CL 94 (L) 08/14/2019 1500   CO2 27 08/14/2019 1500   GLUCOSE 251 (H) 08/14/2019 1500   BUN 11 08/14/2019 1500   CREATININE 0.77 08/14/2019 1500   CALCIUM 8.6 (L) 08/14/2019 1500   GFRNONAA >60 08/14/2019 1500   GFRAA >60 08/14/2019 1500  BNP    Component Value Date/Time   BNP 72.6 08/06/2019 1334    ProBNP No results found for: PROBNP  Specialty Problems      Pulmonary Problems   COPD GOLD 1    Followed in Pulmonary clinic/ [facility]/ [place]  Quit smoking 07/2017   - PFT's  08/31/2017  FEV1 2.09  (90 % ) ratio 71  p no % improvement from saba p nothing  prior to study with DLCO  57 % corrects to 66 % for alv volume          Bronchiectasis without  acute exacerbation (HCC)    Followed in Pulmonary clinic/ [facility]/ [place]  Cough since 1980   01/28/12 CT chest:  Mild bronchiectatic change in the right middle lobe and  lingula. In view of these changes, MAC would be a consideration - HRCT 10/13/2017  1. Scattered mild cylindrical and varicoid bronchiectasis, tree-in-bud opacities and mucoid impaction in both lungs, predominantly in the upper lobes and right middle lobe, mildly progressed since 10/06/2016 chest CT, particularly in anterior left upper lobe. 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 2. Moderate centrilobular emphysema with diffuse bronchial wall thickening, suggesting COPD. - Sinus CT 01/02/18 > Clear paranasal sinuses - 01/04/2018 added prn augmentin for flares  - 04/07/2018 added flutter, stopped macrodantin  - flare since late feb 2021 refractory to abx and pred  > 08/09/2019 acute eval rec cyclical cough rx > resolved       Cough    Followed in Pulmonary clinic/ [facility]/ [place]           Allergies  Allergen Reactions  . Eggs Or Egg-Derived Products     Skin nodules    Immunization History  Administered Date(s) Administered  . Influenza Whole 12/14/2011, 12/13/2016  . Influenza, High Dose Seasonal PF 11/21/2017  . PFIZER SARS-COV-2 Vaccination 04/23/2019, 05/21/2019  . Tdap 10/16/2011    Past Medical History:  Diagnosis Date  . Adenomatous colon polyp 1995, 2011  . Anemia   . Anxiety   . Bronchiectasis ([place])   . Cancer [facility])    uterine cancer  . Carotid stenosis    40-59% right, less than 40% left  . Cataracts, bilateral   . COPD (chronic obstructive pulmonary disease) ([place])   . Dyspnea   . Essential hypertension 02/13/2016  . Hyperlipidemia   . Hypertension   . MAC (mycobacterium avium-intracellulare complex)   . Macular degeneration, dry 02/13/2016  . Polio 1959  . Stomach ulcer 1960  . Urinary tract infection   .  Vasculitis ([place])    Normocomplementemic urticarial   . Vasculitis ([place]) 02/13/2016   Followed by [facility]     Tobacco History: Social History   Tobacco Use  Smoking Status Former Smoker  . Packs/day: 0.50  . Years: 50.00  . Pack years: 25.00  . Types: Cigarettes  . Quit date: 08/01/2017  . Years since quitting: 2.4  Smokeless Tobacco Never Used   Counseling given: Not Answered   Continue to not smoke  Outpatient Encounter Medications as of 12/31/2019  Medication Sig  . albuterol (PROVENTIL) (2.5 MG/3ML) 0.083% nebulizer solution Take 3 mLs (2.5 mg total) by nebulization every 6 (six) hours as needed for wheezing or shortness of breath.  Marland Kitchen albuterol (VENTOLIN HFA) 108 (90 Base) MCG/ACT inhaler Inhale 2 puffs into the lungs every 6 (six) hours as needed for wheezing or shortness of breath.  . ALPRAZolam (XANAX) 0.25 MG tablet  Take 0.25 tablets by mouth daily as needed for anxiety.   Marland Kitchen amitriptyline (ELAVIL) 100 MG tablet Take 100 mg by mouth at bedtime.   Marland Kitchen atenolol (TENORMIN) 50 MG tablet Take 50 mg by mouth daily.  Marland Kitchen atorvastatin (LIPITOR) 40 MG tablet Take 40 mg by mouth every other day. At night  . cholecalciferol (VITAMIN D3) 25 MCG (1000 UT) tablet Take 1,000 Units by mouth daily.  Marland Kitchen docusate sodium (COLACE) 100 MG capsule You can buy this at any drug store, over the counter and follow package instructions (Patient taking differently: Take 100 mg by mouth daily. )  . EPINEPHrine 0.3 mg/0.3 mL IJ SOAJ injection Inject 0.3 mLs into the muscle once as needed for anaphylaxis.   Marland Kitchen ezetimibe (ZETIA) 10 MG tablet Take 10 mg by mouth at bedtime.   . famotidine (PEPCID) 20 MG tablet TAKE 1 TABLET BY MOUTH EVERY DAY  . hydrochlorothiazide (HYDRODIURIL) 25 MG tablet Take 25 mg by mouth daily.  Marland Kitchen losartan (COZAAR) 100 MG tablet Take 100 mg by mouth daily.  Marland Kitchen omeprazole (PRILOSEC OTC) 20 MG tablet TAKE 1 TABLET BY MOUTH EVERYDAY AT BEDTIME  . predniSONE (DELTASONE) 5 MG  tablet Take 3 mg by mouth daily.   . Probiotic CAPS Take 1 capsule by mouth daily.  Marland Kitchen Respiratory Therapy Supplies (FLUTTER) DEVI 1 Device by Does not apply route 2 (two) times daily.  . traMADol (ULTRAM) 50 MG tablet SMARTSIG:1 Tablet(s) By Mouth Every 12 Hours PRN  . vitamin B-12 (CYANOCOBALAMIN) 500 MCG tablet Take 500 mcg by mouth daily.  . [DISCONTINUED] amLODipine (NORVASC) 5 MG tablet Take 5 mg by mouth daily.   . [DISCONTINUED] Tiotropium Bromide-Olodaterol (STIOLTO RESPIMAT) 2.5-2.5 MCG/ACT AERS Inhale 2 puffs into the lungs daily.  . [DISCONTINUED] VENTOLIN HFA 108 (90 Base) MCG/ACT inhaler INHALE 1 PUFF INTO LUNGS EVERY 6 HOURS AS NEEDED FOR WHEEZING FOR SHORTNESS OF BREATH   Facility-Administered Encounter Medications as of 12/31/2019  Medication  . diclofenac sodium (VOLTAREN) 1 % transdermal gel 4 g     Review of Systems  Review of Systems  Constitutional: Positive for fatigue. Negative for activity change and fever.  HENT: Positive for congestion. Negative for sinus pressure, sinus pain and sore throat.   Eyes: Positive for visual disturbance.  Respiratory: Positive for cough and shortness of breath. Negative for wheezing.   Cardiovascular: Negative for chest pain and palpitations.  Gastrointestinal: Negative for diarrhea, nausea and vomiting.  Musculoskeletal: Negative for arthralgias.  Neurological: Negative for dizziness.  Psychiatric/Behavioral: Negative for sleep disturbance. The patient is not nervous/anxious.      Physical Exam  BP (!) 144/70 (BP Location: Right Arm, Patient Position: Sitting, Cuff Size: Normal)   Pulse 83   Temp 98.1 F (36.7 C) (Temporal)   Ht _0  (1.727 m)   Wt 156 lb 6.4 oz (70.9 kg)   SpO2 90%   BMI 23.78 kg/m   Wt Readings from Last 5 Encounters:  12/31/19 156 lb 6.4 oz (70.9 kg)  11/29/19 161 lb 6.4 oz (73.2 kg)  10/09/19 163 lb 12.8 oz (74.3 kg)  09/25/19 160 lb (72.6 kg)  09/18/19 160 lb (72.6 kg)    BMI Readings from  Last 5 Encounters:  12/31/19 23.78 kg/m  11/29/19 24.54 kg/m  10/09/19 24.91 kg/m  09/25/19 24.33 kg/m  09/18/19 24.33 kg/m     Physical Exam Vitals and nursing note reviewed.  Constitutional:      General: She is not in acute distress.  Appearance: Normal appearance. She is normal weight.  HENT:     Head: Normocephalic and atraumatic.     Right Ear: Tympanic membrane, ear canal and external ear normal. There is no impacted cerumen.     Left Ear: Tympanic membrane, ear canal and external ear normal. There is no impacted cerumen.     Nose: Nose normal. No congestion.     Mouth/Throat:     Mouth: Mucous membranes are moist.     Pharynx: Oropharynx is clear.  Eyes:     Pupils: Pupils are equal, round, and reactive to light.  Cardiovascular:     Rate and Rhythm: Normal rate and regular rhythm.     Pulses: Normal pulses.     Heart sounds: Normal heart sounds. No murmur heard.   Pulmonary:     Effort: Pulmonary effort is normal. No respiratory distress.     Breath sounds: No decreased air movement. No decreased breath sounds, wheezing or rales.     Comments: Scattered squeaks Musculoskeletal:     Cervical back: Normal range of motion.  Skin:    General: Skin is warm and dry.     Capillary Refill: Capillary refill takes less than 2 seconds.  Neurological:     General: No focal deficit present.     Mental Status: She is alert and oriented to person, place, and time. Mental status is at baseline.     Motor: Weakness present.     Gait: Gait abnormal.  Psychiatric:        Mood and Affect: Mood normal.        Behavior: Behavior normal.        Thought Content: Thought content normal.        Judgment: Judgment normal.       Assessment & Plan:   Bronchiectasis without acute exacerbation (HCC) Plan: Continue flutter valve 51-monthfollow-up with Dr. WMelvyn Novas  COPD GOLD 1 Started on Stiolto Respimat, she did not notice any improvement next Plan: We will continue to  clinically monitor 662-monthollow-up with Dr. WeMelvyn NovasGastroesophageal reflux disease Patient currently maintained on PPI as well as H2 blocker Patient would like to transition off of these medications if at all possible  Plan: Patient to trial coming off of famotidine Continue PPI at this time   Polymyalgia rheumatica (HCColumbusPlan: Continue prednisone Continue follow-up with rheumatology Recommend that you contact rheumatology regarding your worsening symptoms of PMR  Vision changes Patient feels that her vision has worsened significantly over the last 3 to 6 months She is unsure if she has ophthalmology follow-up She reports she sees a retinal specialist  Plan: Encourage patient to follow-up with her retinal specialist as well as to establish care with ophthalmologist Ambulatory referral to ophthalmology placed today   Macular degeneration, dry Plan: Referred to ophthalmology today Encourage patient to seek eye evaluation given worsened symptoms    Return in about 5 months (around 05/30/2020), or if symptoms worsen or fail to improve, for Follow up with Dr. WeMelvyn Novas  BrLauraine RinneNP 12/31/2019   This appointment required 42 minutes of patient care (this includes precharting, chart review, review of results, face-to-face care, etc.).

## 2019-12-31 NOTE — Assessment & Plan Note (Signed)
Plan: Referred to ophthalmology today Encourage patient to seek eye evaluation given worsened symptoms

## 2019-12-31 NOTE — Assessment & Plan Note (Signed)
Plan: Continue flutter valve 29-month follow-up with Dr. Melvyn Novas

## 2019-12-31 NOTE — Patient Instructions (Addendum)
You were seen today by [name], NP  for:   1. Bronchiectasis without acute exacerbation (HCC)  Bronchiectasis: This is the medical term which indicates that you have damage, dilated airways making you more susceptible to respiratory infection. Use a flutter valve 10 breaths twice a day or 4 to 5 breaths 4-5 times a day to help clear mucus out, okay to use more often if needed  Let us know if you have cough with change in mucus color or fevers or chills.  At that point you would need an antibiotic. Maintain a healthy nutritious diet, eating whole foods Take your medications as prescribed    2. COPD GOLD 1  Only use your albuterol as a rescue medication to be used if you can't catch your breath by resting or doing a relaxed purse lip breathing pattern.  - The less you use it, the better it will work when you need it. - Ok to use up to 2 puffs  every 4 hours if you must but call for immediate appointment if use goes up over your usual need - Don't leave home without it !!  (think of it like the spare tire for your car)   3. Vision changes 4. Nonexudative age-related macular degeneration, unspecified laterality, unspecified stage  - Ambulatory referral to Ophthalmology  Schedule an immediate appointment with ophthalmology for evaluation of your vision changes  5. Polymyalgia rheumatica (HCC)  Continue prednisone as recommended by rheumatology  Keep follow-up with rheumatology  6. Gastroesophageal reflux disease, unspecified whether esophagitis present  Okay to trial coming off of Pepcid.  If symptoms are well controlled at 4 weeks can consider trialing coming off of omeprazole at your request.  Omeprazole 20 mg tablet  >>>Please take 1 tablet daily 15 minutes to 30 minutes before your first meal of the day as well as before your other medications >>>Try to take at the same time each day >>>take this medication daily  GERD management: >>>Avoid laying flat until 2 hours after  meals >>>Elevate head of the bed including entire chest >>>Reduce size of meals and amount of fat, acid, spices, caffeine and sweets >>>If you are smoking, Please stop! >>>Decrease alcohol consumption >>>Work on maintaining a healthy weight with normal BMI     We recommend today:  Orders Placed This Encounter  Procedures  . Ambulatory referral to Ophthalmology    Referral Priority:   Urgent    Referral Type:   Consultation    Referral Reason:   Specialty Services Required    Requested Specialty:   Ophthalmology    Number of Visits Requested:   1   Orders Placed This Encounter  Procedures  . Ambulatory referral to Ophthalmology   No orders of the defined types were placed in this encounter.   Follow Up:    Return in about 5 months (around 05/30/2020), or if symptoms worsen or fail to improve, for Follow up with Dr. Melvyn Novas.   Notification of test results are managed in the following manner: If there are  any recommendations or changes to the  plan of care discussed in office today,  we will contact you and let you know what they are. If you do not hear from [place], then your results are normal and you can view them through your  MyChart account , or a letter will be sent to you. Thank you again for trusting [place] with your care  - Thank you, [facility]    It is flu  season:   >>> Best ways to protect herself from the flu: Receive the yearly flu vaccine, practice good hand hygiene washing with soap and also using hand sanitizer when available, eat a nutritious meals, get adequate rest, hydrate appropriately       Please contact the office if your symptoms worsen or you have concerns that you are not improving.   Thank you for choosing [facility] for your healthcare, and for allowing [place] to partner with you on your healthcare journey. I am thankful to be able to provide care to you today.   Wyn Quaker FNP-C

## 2020-01-07 DIAGNOSIS — N390 Urinary tract infection, site not specified: Secondary | ICD-10-CM | POA: Diagnosis not present

## 2020-01-15 DIAGNOSIS — D849 Immunodeficiency, unspecified: Secondary | ICD-10-CM | POA: Diagnosis not present

## 2020-01-15 DIAGNOSIS — N952 Postmenopausal atrophic vaginitis: Secondary | ICD-10-CM | POA: Diagnosis not present

## 2020-01-15 DIAGNOSIS — N390 Urinary tract infection, site not specified: Secondary | ICD-10-CM | POA: Diagnosis not present

## 2020-01-17 DIAGNOSIS — H353231 Exudative age-related macular degeneration, bilateral, with active choroidal neovascularization: Secondary | ICD-10-CM | POA: Diagnosis not present

## 2020-01-17 DIAGNOSIS — Z961 Presence of intraocular lens: Secondary | ICD-10-CM | POA: Diagnosis not present

## 2020-01-17 DIAGNOSIS — M316 Other giant cell arteritis: Secondary | ICD-10-CM | POA: Diagnosis not present

## 2020-01-17 DIAGNOSIS — H04123 Dry eye syndrome of bilateral lacrimal glands: Secondary | ICD-10-CM | POA: Diagnosis not present

## 2020-01-22 DIAGNOSIS — M150 Primary generalized (osteo)arthritis: Secondary | ICD-10-CM | POA: Diagnosis not present

## 2020-01-22 DIAGNOSIS — M546 Pain in thoracic spine: Secondary | ICD-10-CM | POA: Diagnosis not present

## 2020-01-22 DIAGNOSIS — M353 Polymyalgia rheumatica: Secondary | ICD-10-CM | POA: Diagnosis not present

## 2020-01-22 DIAGNOSIS — H532 Diplopia: Secondary | ICD-10-CM | POA: Diagnosis not present

## 2020-01-23 ENCOUNTER — Encounter: Payer: Self-pay | Admitting: Neurology

## 2020-01-28 ENCOUNTER — Other Ambulatory Visit: Payer: Self-pay | Admitting: Internal Medicine

## 2020-01-28 DIAGNOSIS — K219 Gastro-esophageal reflux disease without esophagitis: Secondary | ICD-10-CM

## 2020-01-29 DIAGNOSIS — E871 Hypo-osmolality and hyponatremia: Secondary | ICD-10-CM | POA: Diagnosis not present

## 2020-01-29 DIAGNOSIS — J449 Chronic obstructive pulmonary disease, unspecified: Secondary | ICD-10-CM | POA: Diagnosis not present

## 2020-01-29 DIAGNOSIS — I129 Hypertensive chronic kidney disease with stage 1 through stage 4 chronic kidney disease, or unspecified chronic kidney disease: Secondary | ICD-10-CM | POA: Diagnosis not present

## 2020-01-29 DIAGNOSIS — N182 Chronic kidney disease, stage 2 (mild): Secondary | ICD-10-CM | POA: Diagnosis not present

## 2020-02-12 DIAGNOSIS — H353132 Nonexudative age-related macular degeneration, bilateral, intermediate dry stage: Secondary | ICD-10-CM | POA: Diagnosis not present

## 2020-02-12 DIAGNOSIS — H532 Diplopia: Secondary | ICD-10-CM | POA: Diagnosis not present

## 2020-02-12 DIAGNOSIS — N390 Urinary tract infection, site not specified: Secondary | ICD-10-CM | POA: Diagnosis not present

## 2020-02-18 NOTE — Progress Notes (Signed)
NEUROLOGY CONSULTATION NOTE  Heidi Hobbs MRN: [id number] DOB: January 06, 1941  Referring provider: Gavin Pound, MD Primary care provider: Shon Baton, MD  Reason for consult:  Headache   Subjective:  Heidi Hobbs is an 79 year old right-handed female with PMR, osteoarthritis, and macular degeneration who presents for headache.  History supplemented by referring provider's note.  She is accompanied by her daughter who also supplements history.  40m a while  Left back, righ pressure from pillow aggravates it.  Scalp feels hot to the touch.  Weaning down from prednisone worse  She has history of neck pain and bilateral arm pain.  She was found to have cervical spinal stenosis and subsequently underwent C5-6 and C6-7 ACDF in September 2020, however the arm pain did not resolve.  MRI of cervical spine on 12/27/2018 personally reviewed showed ACDF C5-6 and C6-7 with no residual impingement as well as moderate left facet arthritis at C3-4.  She was subsequently diagnosed with polymyalgia rheumatica in January 2021.  Around the same time, she developed a new headache as well.  Temporal arteritis was suspected.  She was initially on high-dose prednisone which has gradually been tapered down over the course of the past several months.  She said the headaches really didn't improve.  She has known macular degeneration in her left eye but over the past year has noted some decreased vision in her right eye.  She also has residual and chronic postpolio vertical diplopia but started experiencing horizontal diplopia.  She underwent right temporal artery biopsy, which was negative.   She did see Dr. GDelman [facility]headleof ophthalmology.  Notes aren't available but it appears that she didn't have evidence of an ischemic etiology.  She continues to have headache, described as severe pounding holocephalic pain, particularly in the left occipital region, lasting about 8 hours and occurring daily.  Maybe some photophobia but no  nausea or phonophobia.  She reports that her entire scalp, particularly in the back of her head, feels sore and hot to the touch.  MRI of brain with and without contrast on 09/05/2019 showed mild chronic small vessel ischemic changes but no acute abnormalities.  She continues to have some neck pain.  She takes Tylenol and tramadol daily.  She has taken amitriptyline 1049mfor bedtime for the past 40 years.  She tried gabapentin but it didn't agree with her.   Current steroids/NSAIDS/analgesics:  Tylenol, ASA 8167mprednisone 3mg61mily, tramadol 50mg3mrent muscle relaxants:  Methocarbamol 500mg 12mPRN Current Antihypertensive medications:  HCTZ, losartan, atenolol 50mg, 74malazine Current Antidepressant medications:  Amitriptyline 100mg at56mtime Current Anticonvulsant medications:  none Current anti-CGRP:  none Current Vitamins/Herbal/Supplements:  D3, B12, priobiotic  Past medications:  gabapentin  PAST MEDICAL HISTORY: Past Medical History:  Diagnosis Date  . Adenomatous colon polyp 1995, 2011  . Anemia   . Anxiety   . Bronchiectasis (HCC)   .[facility]   [facility]  . Carotid stenosis    40-59% right, less than 40% left  . Cataracts, bilateral   . COPD (chronic obstructive pulmonary disease) (HCC)   .[place]   . Essential hypertension 02/13/2016  . Hyperlipidemia   . Hypertension   . MAC (mycobacterium avium-intracellulare complex)   . Macular degeneration, dry 02/13/2016  . Polio 1959  . Stomach ulcer 1960  . Urinary tract infection   . Vasculitis (HCC)    [place] urticarial   . Vasculitis (HCC) 12/[id number]   [facility]  PAST SURGICAL HISTORY: Past Surgical History:  Procedure Laterality Date  . ANTERIOR CERVICAL DECOMP/DISCECTOMY FUSION N/A 11/27/2018   Procedure: CERVICAL FIVE TO SIX , CERVICAL SIX TO SEVEN, ANTERIOR CERVICAL DECOMPRESSION/DISCECTOMY FUSION, ALLOGRAFT, PLATE;  Surgeon: [name], MD;  Location: [place];  Service: Orthopedics;  Laterality: N/A;  . APPENDECTOMY     rupture appendix  . ARTERY BIOPSY N/A 08/17/2019   Procedure: BIOPSY TEMPORAL ARTERY;  Surgeon: Leta Baptist, MD;  Location: [place];  Service: ENT;  Laterality: N/A;  . BREAST ENHANCEMENT SURGERY     removal  . CATARACT EXTRACTION    . COLONOSCOPY    . COLONOSCOPY  10/14/09  . LAPAROSCOPIC APPENDECTOMY N/A 01/23/2016   Procedure: APPENDECTOMY LAPAROSCOPIC;  Surgeon: Erroll Luna, MD;  Location: [place];  Service: General;  Laterality: N/A;  . POLYPECTOMY  2011   x8  . TONSILLECTOMY AND ADENOIDECTOMY    . VAGINAL HYSTERECTOMY  1970   uterine cancer    MEDICATIONS: Current Outpatient Medications on File Prior to Visit  Medication Sig Dispense Refill  . albuterol (PROVENTIL) (2.5 MG/3ML) 0.083% nebulizer solution Take 3 mLs (2.5 mg total) by nebulization every 6 (six) hours as needed for wheezing or shortness of breath. 75 mL 12  . albuterol (VENTOLIN HFA) 108 (90 Base) MCG/ACT inhaler Inhale 2 puffs into the lungs every 6 (six) hours as needed for wheezing or shortness of breath. 8.5 g 2  . ALPRAZolam (XANAX) 0.25 MG tablet Take 0.25 tablets by mouth daily as needed for anxiety.   1  . amitriptyline (ELAVIL) 100 MG tablet Take 100 mg by mouth at bedtime.     Marland Kitchen atenolol (TENORMIN) 50 MG tablet Take 50 mg by mouth daily.    Marland Kitchen atorvastatin (LIPITOR) 40 MG tablet Take 40 mg by mouth every other day. At night    . cholecalciferol (VITAMIN D3) 25 MCG (1000 UT) tablet Take 1,000 Units by mouth daily.    Marland Kitchen docusate sodium (COLACE) 100 MG capsule You can buy this at any drug store, over the counter and follow package instructions (Patient taking differently: Take 100 mg by mouth daily. ) 10 capsule 0  . EPINEPHrine 0.3 mg/0.3 mL IJ SOAJ injection Inject 0.3 mLs into the muscle once as needed for anaphylaxis.     Marland Kitchen ezetimibe (ZETIA) 10 MG tablet Take 10 mg by mouth at bedtime.     . famotidine (PEPCID) 20 MG tablet TAKE 1  TABLET BY MOUTH EVERY DAY 90 tablet 1  . hydrochlorothiazide (HYDRODIURIL) 25 MG tablet Take 25 mg by mouth daily.    Marland Kitchen losartan (COZAAR) 100 MG tablet Take 100 mg by mouth daily.    Marland Kitchen omeprazole (PRILOSEC OTC) 20 MG tablet Take 1 tablet (20 mg total) by mouth daily before breakfast. 30 tablet 3  . predniSONE (DELTASONE) 5 MG tablet Take 3 mg by mouth daily.     . Probiotic CAPS Take 1 capsule by mouth daily.    Marland Kitchen Respiratory Therapy Supplies (FLUTTER) DEVI 1 Device by Does not apply route 2 (two) times daily. 1 each 0  . traMADol (ULTRAM) 50 MG tablet SMARTSIG:1 Tablet(s) By Mouth Every 12 Hours PRN    . vitamin B-12 (CYANOCOBALAMIN) 500 MCG tablet Take 500 mcg by mouth daily.     Current Facility-Administered Medications on File Prior to Visit  Medication Dose Route Frequency Provider Last Rate Last Admin  . diclofenac sodium (VOLTAREN) 1 % transdermal gel 4 g  4 g Topical  QID [name], MD        ALLERGIES: Allergies  Allergen Reactions  . Eggs Or Egg-Derived Products     Skin nodules    FAMILY HISTORY: Family History  Problem Relation Age of Onset  . Colon cancer Father   . Esophageal cancer Brother   . Esophageal cancer Brother   . Asthma Daughter        as a child  . Stomach cancer Neg Hx   . Rectal cancer Neg Hx     SOCIAL HISTORY: Social History   Socioeconomic History  . Marital status: Widowed    Spouse name: Not on file  . Number of children: 3  . Years of education: Not on file  . Highest education level: Not on file  Occupational History  . 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 Smoking status: Former Smoker    Packs/day: 0.50    Years: 50.00    Pack years: 25.00    Types: Cigarettes    Quit date: 08/01/2017    Years since quitting: 2.5  . Smokeless tobacco: Never Used  Vaping Use  . Vaping Use: Never used  Substance and Sexual Activity  . Alcohol use: No    Alcohol/week: 0.0 standard drinks  . Drug use: No  . Sexual activity:  Not on file  Other Topics Concern  . Not on file  Social History Narrative  . Not on file   Social Determinants of Health   Financial Resource Strain:   . Difficulty of Paying Living Expenses: Not on file  Food Insecurity:   . Worried About Charity fundraiser in the Last Year: Not on file  . Ran Out of Food in the Last Year: Not on file  Transportation Needs:   . Lack of Transportation (Medical): Not on file  . Lack of Transportation (Non-Medical): Not on file  Physical Activity:   . Days of Exercise per Week: Not on file  . Minutes of Exercise per Session: Not on file  Stress:   . Feeling of Stress : Not on file  Social Connections:   . Frequency of Communication with Friends and Family: Not on file  . Frequency of Social Gatherings with Friends and Family: Not on file  . Attends Religious Services: Not on file  . Active Member of Clubs or Organizations: Not on file  . Attends Archivist Meetings: Not on file  . Marital Status: Not on file  Intimate Partner Violence:   . Fear of Current or Ex-Partner: Not on file  . Emotionally Abused: Not on file  . Physically Abused: Not on file  . Sexually Abused: Not on file    Objective:  Blood pressure (!) 97/58, pulse 88, resp. rate 20, height _0  (1.727 m), weight 151 lb (68.5 kg), SpO2 95 %. General: No acute distress.  Patient appears well-groomed.   Head:  Normocephalic/atraumatic Eyes:  fundi examined but not visualized Neck: supple, no paraspinal tenderness, full range of motion Back: No paraspinal tenderness Heart: regular rate and rhythm Lungs: Clear to auscultation bilaterally. Vascular: No carotid bruits. Neurological Exam: Mental status: alert and oriented to person, place, and time, recent and remote memory intact, fund of knowledge intact, attention and concentration intact, speech fluent and not dysarthric, language intact. Cranial nerves: CN I: not tested CN II: pupils equal, round and reactive to  light, visual fields intact CN III, IV, VI:  full range of motion, no nystagmus,  no ptosis CN V: facial sensation intact. CN VII: upper and lower face symmetric CN VIII: hearing intact CN IX, X: gag intact, uvula midline CN XI: sternocleidomastoid and trapezius muscles intact CN XII: tongue midline Bulk & Tone: normal, no fasciculations. Motor:  muscle strength 5/5 throughout Sensation:  Pinprick, temperature and vibratory sensation intact. Deep Tendon Reflexes:  2+ throughout,  toes downgoing.   Finger to nose testing:  Without dysmetria.   Heel to shin:  Without dysmetria.   Gait:  Normal station and stride.  Romberg negative.  Assessment/Plan:   1.  Headache - in setting of PMR, temporal arteritis would be high on differential.  However, biopsy was negative and she said she really didn't respond well to prednisone.  Her headache may be cervicogenic.  MRI of cervical spine from October 2020 did reveal some left C3-4 arthritis, which may be a contributing factor.  Also, medication-overuse may be aggravating it.  Management will be difficult.  She already is taking amitriptyline and she has not tolerated gabapentin.  I would like to start a small dose of tizanidine at bedtime.    1.  Tizanidine 47m at bedtime.  We can increase dose if needed/as tolerated. 2.  Limit use of pain relievers to no more than 2 days out of week to prevent risk of rebound or medication-overuse headache. 3.  Will obtain notes from ophthalmology 4.  Follow up 6 months.  Thank you for allowing me to take part in the care of this patient.  AMetta Clines DO  CC:  JShon Baton MD  AGavin Pound MD

## 2020-02-19 ENCOUNTER — Encounter: Payer: Self-pay | Admitting: Neurology

## 2020-02-19 ENCOUNTER — Ambulatory Visit (INDEPENDENT_AMBULATORY_CARE_PROVIDER_SITE_OTHER): Payer: Medicare Other | Admitting: Neurology

## 2020-02-19 ENCOUNTER — Other Ambulatory Visit: Payer: Self-pay

## 2020-02-19 VITALS — BP 97/58 | HR 88 | Resp 20 | Ht 68.0 in | Wt 151.0 lb

## 2020-02-19 DIAGNOSIS — M353 Polymyalgia rheumatica: Secondary | ICD-10-CM

## 2020-02-19 DIAGNOSIS — R519 Headache, unspecified: Secondary | ICD-10-CM

## 2020-02-19 DIAGNOSIS — G8929 Other chronic pain: Secondary | ICD-10-CM | POA: Diagnosis not present

## 2020-02-19 DIAGNOSIS — Z981 Arthrodesis status: Secondary | ICD-10-CM

## 2020-02-19 MED ORDER — TIZANIDINE HCL 2 MG PO TABS: 2.0000 mg | ORAL_TABLET | Freq: Every day | ORAL | 5 refills | Status: DC

## 2020-02-19 NOTE — Patient Instructions (Signed)
1.  Start tizanidine 2mg  at bedtime.  We can increase dose if needed. 2.  Will obtain notes from optometry and orthopedics 3.  If possible, try to limit use of pain relievers to no more than 2 days out of week to prevent risk of rebound or medication-overuse headache. 4.  Follow up 6 months.

## 2020-03-04 DIAGNOSIS — R627 Adult failure to thrive: Secondary | ICD-10-CM | POA: Diagnosis not present

## 2020-03-04 DIAGNOSIS — J449 Chronic obstructive pulmonary disease, unspecified: Secondary | ICD-10-CM | POA: Diagnosis not present

## 2020-03-04 DIAGNOSIS — Z1152 Encounter for screening for COVID-19: Secondary | ICD-10-CM | POA: Diagnosis not present

## 2020-03-04 DIAGNOSIS — E099 Drug or chemical induced diabetes mellitus without complications: Secondary | ICD-10-CM | POA: Diagnosis not present

## 2020-03-04 DIAGNOSIS — M315 Giant cell arteritis with polymyalgia rheumatica: Secondary | ICD-10-CM | POA: Diagnosis not present

## 2020-03-04 DIAGNOSIS — R5383 Other fatigue: Secondary | ICD-10-CM | POA: Diagnosis not present

## 2020-03-04 DIAGNOSIS — H532 Diplopia: Secondary | ICD-10-CM | POA: Diagnosis not present

## 2020-03-18 DIAGNOSIS — H532 Diplopia: Secondary | ICD-10-CM | POA: Diagnosis not present

## 2020-03-20 DIAGNOSIS — H353221 Exudative age-related macular degeneration, left eye, with active choroidal neovascularization: Secondary | ICD-10-CM | POA: Diagnosis not present

## 2020-03-20 DIAGNOSIS — M316 Other giant cell arteritis: Secondary | ICD-10-CM | POA: Diagnosis not present

## 2020-03-20 DIAGNOSIS — H353210 Exudative age-related macular degeneration, right eye, stage unspecified: Secondary | ICD-10-CM | POA: Diagnosis not present

## 2020-03-20 DIAGNOSIS — Z961 Presence of intraocular lens: Secondary | ICD-10-CM | POA: Diagnosis not present

## 2020-03-24 DIAGNOSIS — H353230 Exudative age-related macular degeneration, bilateral, stage unspecified: Secondary | ICD-10-CM | POA: Diagnosis not present

## 2020-03-24 DIAGNOSIS — H4312 Vitreous hemorrhage, left eye: Secondary | ICD-10-CM | POA: Diagnosis not present

## 2020-04-01 ENCOUNTER — Ambulatory Visit: Payer: Medicare Other | Admitting: Gastroenterology

## 2020-04-03 DIAGNOSIS — H4312 Vitreous hemorrhage, left eye: Secondary | ICD-10-CM | POA: Insufficient documentation

## 2020-04-03 DIAGNOSIS — H353230 Exudative age-related macular degeneration, bilateral, stage unspecified: Secondary | ICD-10-CM | POA: Insufficient documentation

## 2020-04-03 DIAGNOSIS — Z961 Presence of intraocular lens: Secondary | ICD-10-CM | POA: Diagnosis not present

## 2020-04-08 ENCOUNTER — Ambulatory Visit: Payer: Medicare Other | Admitting: Neurology

## 2020-04-10 DIAGNOSIS — J449 Chronic obstructive pulmonary disease, unspecified: Secondary | ICD-10-CM | POA: Diagnosis not present

## 2020-04-10 DIAGNOSIS — I2721 Secondary pulmonary arterial hypertension: Secondary | ICD-10-CM | POA: Diagnosis not present

## 2020-04-10 DIAGNOSIS — M1990 Unspecified osteoarthritis, unspecified site: Secondary | ICD-10-CM | POA: Diagnosis not present

## 2020-04-10 DIAGNOSIS — I129 Hypertensive chronic kidney disease with stage 1 through stage 4 chronic kidney disease, or unspecified chronic kidney disease: Secondary | ICD-10-CM | POA: Diagnosis not present

## 2020-04-10 DIAGNOSIS — E785 Hyperlipidemia, unspecified: Secondary | ICD-10-CM | POA: Diagnosis not present

## 2020-04-10 DIAGNOSIS — N182 Chronic kidney disease, stage 2 (mild): Secondary | ICD-10-CM | POA: Diagnosis not present

## 2020-04-10 DIAGNOSIS — I6521 Occlusion and stenosis of right carotid artery: Secondary | ICD-10-CM | POA: Diagnosis not present

## 2020-04-10 DIAGNOSIS — D519 Vitamin B12 deficiency anemia, unspecified: Secondary | ICD-10-CM | POA: Diagnosis not present

## 2020-04-10 DIAGNOSIS — M351 Other overlap syndromes: Secondary | ICD-10-CM | POA: Diagnosis not present

## 2020-04-10 DIAGNOSIS — J479 Bronchiectasis, uncomplicated: Secondary | ICD-10-CM | POA: Diagnosis not present

## 2020-04-10 DIAGNOSIS — R627 Adult failure to thrive: Secondary | ICD-10-CM | POA: Diagnosis not present

## 2020-04-10 DIAGNOSIS — F32A Depression, unspecified: Secondary | ICD-10-CM | POA: Diagnosis not present

## 2020-04-10 DIAGNOSIS — H3530 Unspecified macular degeneration: Secondary | ICD-10-CM | POA: Diagnosis not present

## 2020-04-10 DIAGNOSIS — G14 Postpolio syndrome: Secondary | ICD-10-CM | POA: Diagnosis not present

## 2020-04-10 DIAGNOSIS — F419 Anxiety disorder, unspecified: Secondary | ICD-10-CM | POA: Diagnosis not present

## 2020-04-10 DIAGNOSIS — H3520 Other non-diabetic proliferative retinopathy, unspecified eye: Secondary | ICD-10-CM | POA: Diagnosis not present

## 2020-04-24 ENCOUNTER — Telehealth: Payer: Self-pay | Admitting: Neurology

## 2020-04-24 NOTE — Telephone Encounter (Signed)
Patient needs to follow up with the ophthalmologist for evaluation and workup

## 2020-04-24 NOTE — Telephone Encounter (Signed)
Advised pt to speak to her Ophthalmologist

## 2020-04-24 NOTE — Telephone Encounter (Signed)
Telephone call to pt and daughter pt reports weakness of the right side, since her injection of her left eye pt can not see out of that eye. No numbness or dropping of the face or left eye. The blurry vision of the right eye. So the pt an daughter wanted to know if she could have a MRI of the Eyes to see if the injection may have caused a stroke.   Please advise.

## 2020-04-24 NOTE — Telephone Encounter (Signed)
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 There was a blood vessel that ruptured and has not repaired itself. She also states her other eye is also almost blind. She also has physical therapy and they have talked about her being weak on her right side. The daughter thinks she may have had a stroke. She is wanting some advice.

## 2020-04-29 DIAGNOSIS — H353132 Nonexudative age-related macular degeneration, bilateral, intermediate dry stage: Secondary | ICD-10-CM | POA: Diagnosis not present

## 2020-04-29 DIAGNOSIS — H4312 Vitreous hemorrhage, left eye: Secondary | ICD-10-CM | POA: Diagnosis not present

## 2020-04-29 DIAGNOSIS — H532 Diplopia: Secondary | ICD-10-CM | POA: Diagnosis not present

## 2020-05-01 DIAGNOSIS — Z961 Presence of intraocular lens: Secondary | ICD-10-CM | POA: Diagnosis not present

## 2020-05-01 DIAGNOSIS — H4312 Vitreous hemorrhage, left eye: Secondary | ICD-10-CM | POA: Diagnosis not present

## 2020-05-01 DIAGNOSIS — H353230 Exudative age-related macular degeneration, bilateral, stage unspecified: Secondary | ICD-10-CM | POA: Diagnosis not present

## 2020-05-06 DIAGNOSIS — N390 Urinary tract infection, site not specified: Secondary | ICD-10-CM | POA: Diagnosis not present

## 2020-05-06 DIAGNOSIS — N952 Postmenopausal atrophic vaginitis: Secondary | ICD-10-CM | POA: Diagnosis not present

## 2020-05-06 DIAGNOSIS — D849 Immunodeficiency, unspecified: Secondary | ICD-10-CM | POA: Diagnosis not present

## 2020-05-06 DIAGNOSIS — R339 Retention of urine, unspecified: Secondary | ICD-10-CM | POA: Diagnosis not present

## 2020-05-08 DIAGNOSIS — M545 Low back pain, unspecified: Secondary | ICD-10-CM | POA: Diagnosis not present

## 2020-05-08 DIAGNOSIS — M546 Pain in thoracic spine: Secondary | ICD-10-CM | POA: Diagnosis not present

## 2020-05-08 DIAGNOSIS — M150 Primary generalized (osteo)arthritis: Secondary | ICD-10-CM | POA: Diagnosis not present

## 2020-05-08 DIAGNOSIS — M353 Polymyalgia rheumatica: Secondary | ICD-10-CM | POA: Diagnosis not present

## 2020-05-29 ENCOUNTER — Ambulatory Visit (INDEPENDENT_AMBULATORY_CARE_PROVIDER_SITE_OTHER): Payer: Medicare Other

## 2020-05-29 ENCOUNTER — Ambulatory Visit (INDEPENDENT_AMBULATORY_CARE_PROVIDER_SITE_OTHER): Payer: Medicare Other | Admitting: Internal Medicine

## 2020-05-29 ENCOUNTER — Other Ambulatory Visit: Payer: Self-pay

## 2020-05-29 ENCOUNTER — Encounter: Payer: Self-pay | Admitting: Internal Medicine

## 2020-05-29 DIAGNOSIS — J479 Bronchiectasis, uncomplicated: Secondary | ICD-10-CM

## 2020-05-29 DIAGNOSIS — J440 Chronic obstructive pulmonary disease with acute lower respiratory infection: Secondary | ICD-10-CM

## 2020-05-29 DIAGNOSIS — J209 Acute bronchitis, unspecified: Secondary | ICD-10-CM

## 2020-05-29 DIAGNOSIS — J439 Emphysema, unspecified: Secondary | ICD-10-CM | POA: Diagnosis not present

## 2020-05-29 DIAGNOSIS — R059 Cough, unspecified: Secondary | ICD-10-CM | POA: Diagnosis not present

## 2020-05-29 MED ORDER — ALBUTEROL SULFATE HFA 108 (90 BASE) MCG/ACT IN AERS: 2.0000 | INHALATION_SPRAY | RESPIRATORY_TRACT | 2 refills | Status: DC | PRN

## 2020-05-29 MED ORDER — AZITHROMYCIN 250 MG PO TABS: ORAL_TABLET | ORAL | 0 refills | Status: DC

## 2020-05-29 NOTE — Progress Notes (Signed)
Subjective:   Patient ID: Heidi Hobbs, female    DOB: Nov 07, 1940     MRN: [id number]   Brief patient profile:  66 yowf quit smoking 07/2017 with  Chronic cough since 1980 limited by sob then breathing worse in April 2014 while being being treated by  Heidi Hobbs  For hives, rx with prn Ventolin but didn't help resting sob and dx with bronchiectasis and GOLD I copd criteria 09/27/12   H/o Exposure to chlorine gas in 1980's did not required hospitalization > w/in  Few years started getting recurrent pna  Stopped taking daily macrodantin around 03/2017  Has immunodef f/u by [facility]   History of Present Illness  08/17/2012 1st pulmonary cc indolent cough x sev years better when stops smoking, not limited by breathing. Cough is mostly clear more p shower, and in ams also but doesn't disturb sleep. rec Mucinex or mucinex dm prn     12/05/2017  f/u ov/ re: COPD GOLD I/ bronchiectasis  Chief Complaint  Patient presents with  . Follow-up    went to UC approx 4 wks ago and was told she had PNA-txed with pred and doxy. She states her breathing is doing okay. She is having some left side back pain. She rareley uses her albuterol.   Dyspnea:  MMRC2 = can't walk a nl pace on a flat grade s sob but does fine slow and flat  Cough: variable cough yellow and green better doxy with assoc sinus congestion, some worse since stopped doxy as is  assoc L post cp  Sleeping: one pillow bed flat  SABA use: rarely   rec Augmentin 875 mg take one pill twice daily  X 10 days - take at breakfast and supper with large glass of water.  It would help reduce the usual side effects (diarrhea and yeast infections) if you ate cultured yogurt at lunch.  Bronchiectasis =   you have scarring of your bronchial tubes  Whenever you develop cough congestion take mucinex or mucinex dm  1200 mg every 12 hours > these will help keep the mucus loose  sinus CT in 2 weeks ->>> all clear   01/04/2018  f/u ov/ re:  bronchiectasis / better p course of augmentin  Chief Complaint  Patient presents with  . Follow-up    Cough has improved  some.  She occ produces some yellow sputum.  She rarely uses her albuterol inhaler.   Dyspnea:  mmrc2 Cough: first thing in am  Sleeping: 2 pillow /flat bed  SABA use: rarely  02: none  rec For cough / congestion >  mucinex or mucinex dm up to 1200 mg every 12 hours as needed For nasty mucus  >  Augmentin 875 mg take one pill twice daily  X 10 days - take at breakfast and supper with large glass of water.  It would help reduce the usual side effects (diarrhea and yeast infections) if you ate cultured yogurt at lunch.     04/07/2018  f/u ov/ re: bronchiectasis / just started back on macrodantin Chief Complaint  Patient presents with  . Follow-up    Breathing is doing well. She is coughing alot at night- admits to eating alot of chocolate at night.   Dyspnea:  MMRC2 = can't walk a nl pace on a flat grade s sob but does fine slow and flat ht ok  Cough: worse at hs and in am eating lots of kisses at bedside Sleeping: flat bed  SABA use:  none  02: none  rec Cipro 500 mg twice daily with meals x one week and stop the macrodantin  Cough > mucinex 1200 mg every 12 hours as needed and flutter valve as much as possible  6-8 in bed blocks very important - see below   GERD    06/08/19  Flare x 4-6 weeks > eval [name]  rx doxy  07/30/19  Flare rx [name] another doxy  07/17/19 ER cxr > atx bases   08/09/2019  Acute extended  ov/ re: still flare of coughing with dx bronchiectasis / ? Giant cell arthritis for bx in  8 days  Chief Complaint  Patient presents with  . Acute Visit    pt is coughing sob, pt coughing up claer mucus  Dyspnea:  Mostly when coughing  Cough: severe 24/7 coughing fits with minimal clear mucus production despite on high dose pred for ? PMR  Taking ppi at hs and h2 in am  Only relief she gets from cough is p takes tramadol but only uses once  or twice a day rec Change :  Try prilosec otc 20mg   Take 30-60 min before first meal of the day and Pepcid ac (famotidine) 20 mg one @  Bedtime  6-8 inch bed blocks  For cough mucinex dm up to 1200 mg every 12 hours and use flutter valve as much as possible when coughing  If still coughing, do the above plus add the tramadol 50 mg every 4 hours as needed For shortness of breath > albuterol neb or spray up to every 4 hours as needed  GERD diet   For mouth sore > clotrimazole troche 10 mg up to 5 x daily     11/29/2019  f/u ov/ re: bronchiectasis  Chief Complaint  Patient presents with  . Follow-up    Cough, GERD and Bronchiectasis, some SOB with activities.  Dyspnea:  Able to walk around house now / pred 5 mg daily  Cough: not much cough / using flutter sparingly Sleeping: 45 degrees electric bed SABA use: qod / rarely neb  02: none rec For cough > mucinex dm up to 1200mg  every 12 hours with flutter valve as much as possible     05/29/2020  f/u ov/ re: bronchiectasis  Flare x 1-2 weeks Chief Complaint  Patient presents with  . Follow-up    Pt c/o increased cough, runny nose, nasal congestion. She is coughing up some clear sputum.    Dyspnea:  Across the room / back on pred 5 mg daily x 3 weeks prior to OV  For pmr  Cough: assoc with nasal congestion / no purulent secretions  Sleeping: 45 degrees bed = baseline SABA use: not using neb 02: none  Covid status:   2nd Pfizer 05/2019  No 3rd shot yet    No obvious day to day or daytime variability or assoc  purulent sputum or mucus plugs or hemoptysis or cp or chest tightness, subjective wheeze or overt sinus or hb symptoms.   Sleeping  without nocturnal  or early am exacerbation  of respiratory  c/o's or need for noct saba. Also denies any obvious fluctuation of symptoms with weather or environmental changes or other aggravating or alleviating factors except as outlined above   No unusual exposure hx or h/o childhood pna/  asthma or knowledge of premature birth.  Current Allergies, Complete Past Medical History, Past Surgical History, Family History, and Social History were reviewed in [employer] record.  ROS  The following are not active complaints unless bolded Hoarseness, sore throat, dysphagia, dental problems, itching, sneezing,  nasal congestion or discharge of excess mucus or purulent secretions, ear ache,   fever, chills, sweats, unintended wt loss or wt gain, classically pleuritic or exertional cp,  orthopnea pnd or arm/hand swelling  or leg swelling, presyncope, palpitations, abdominal pain, anorexia, nausea, vomiting, diarrhea  or change in bowel habits or change in bladder habits, change in stools or change in urine, dysuria, hematuria,  rash, arthralgias, visual complaints, headache, numbness, weakness or ataxia or problems with walking or coordination/ w/c when goes out,  change in mood or  memory.        Current Meds  Medication Sig  . albuterol (PROVENTIL) (2.5 MG/3ML) 0.083% nebulizer solution Take 3 mLs (2.5 mg total) by nebulization every 6 (six) hours as needed for wheezing or shortness of breath.  Marland Kitchen albuterol (VENTOLIN HFA) 108 (90 Base) MCG/ACT inhaler Inhale 2 puffs into the lungs every 6 (six) hours as needed for wheezing or shortness of breath.  . ALPRAZolam (XANAX) 0.25 MG tablet Take 0.25 tablets by mouth daily as needed for anxiety.   Marland Kitchen amitriptyline (ELAVIL) 100 MG tablet Take 100 mg by mouth at bedtime.   Marland Kitchen atenolol (TENORMIN) 50 MG tablet Take 50 mg by mouth daily.  Marland Kitchen atorvastatin (LIPITOR) 40 MG tablet Take 40 mg by mouth every other day. At night  . cholecalciferol (VITAMIN D3) 25 MCG (1000 UT) tablet Take 1,000 Units by mouth daily.  Marland Kitchen docusate sodium (COLACE) 100 MG capsule You can buy this at any drug store, over the counter and follow package instructions (Patient taking differently: Take 100 mg by mouth daily.)  . EPINEPHrine 0.3 mg/0.3 mL IJ SOAJ  injection Inject 0.3 mLs into the muscle once as needed for anaphylaxis.   Marland Kitchen ezetimibe (ZETIA) 10 MG tablet Take 10 mg by mouth at bedtime.   . hydrochlorothiazide (HYDRODIURIL) 25 MG tablet Take 25 mg by mouth daily.  Marland Kitchen losartan (COZAAR) 100 MG tablet Take 100 mg by mouth daily.  Marland Kitchen omeprazole (PRILOSEC OTC) 20 MG tablet Take 1 tablet (20 mg total) by mouth daily before breakfast.  . predniSONE (DELTASONE) 5 MG tablet Take 1 mg by mouth daily.   . Probiotic CAPS Take 1 capsule by mouth daily.  Marland Kitchen Respiratory Therapy Supplies (FLUTTER) DEVI 1 Device by Does not apply route 2 (two) times daily.  . traMADol (ULTRAM) 50 MG tablet SMARTSIG:1 Tablet(s) By Mouth Every 12 Hours PRN   Current Facility-Administered Medications for the 05/29/20 encounter (Office Visit) with Tanda Rockers, MD  Medication  . diclofenac sodium (VOLTAREN) 1 % transdermal gel 4 g                   Objective:   Physical Exam  05/29/2020         150  11/29/2019         161   08/09/2019        159  04/07/2018        156  01/04/2018      153  12/05/2017        150  08/31/2017        154  07/22/2017        153   08/17/12 157 lb 9.6 oz (71.487 kg)  05/04/11 156 lb (70.761 kg)  04/20/11 156 lb 6.4 oz (70.943 kg)      Vital signs reviewed  05/29/2020  - Note  at rest 02 sats  95% on RA   General appearance:   edlerly thin wf congested cough/ w/c bound s increased wob    HEENT: Full dentures    HEENT : pt wearing mask not removed for exam due to covid -19 concerns.    NECK :  without JVD/Nodes/TM/ nl carotid upstrokes bilaterally   LUNGS: no acc muscle use,  contour chest mod kyphosis with a few insp squeaks and exp rhonchi bilaterally    without cough on insp or exp maneuvers   CV:  RRR  no s3 or murmur or increase in P2, and no edema   ABD:  soft and nontender with nl inspiratory excursion in the supine position. No bruits or organomegaly appreciated, bowel sounds nl  MS:   ext warm without deformities, calf  tenderness, cyanosis or clubbing No obvious joint restrictions in w/c position  SKIN: warm and dry without lesions    NEURO:  alert, approp, nl sensorium with  no motor or cerebellar deficits apparent.          CXR PA and Lateral:   05/29/2020 :    I personally reviewed images and agree with radiology impression as follows:   1. Mild hazy opacity at the left base is increased since May of 2021 and could represent mild increased atelectasis or developing infiltrate given history of cough. Recommend clinical correlation and attention on short-term follow-up. 2. Emphysematous changes. My review:  Nothing on the lateral view to suggest atx or as dz         Assessment & Plan:

## 2020-05-29 NOTE — Patient Instructions (Addendum)
For nasal symptoms best options: allegra(fexofenadine) or clariton (loratadine) per bottle    I very strongly recommend you get the  pfizer vaccine as soon as possible based on your risk of dying from the virus  and the proven safety and benefit of these vaccines against even the delta and omicron variants.   Take prednisone 5 mg x 2 with bfast until better then 1 daily   For shortness of breath > nebulizer up to every 4 hours as needed   For cough > mucinex dm up to 1200mg  every 12 hours with flutter valve as much as possible    Please remember to go to the  x-ray department  for your tests - we will call you with the results when they are available

## 2020-05-31 ENCOUNTER — Encounter: Payer: Self-pay | Admitting: Internal Medicine

## 2020-05-31 NOTE — Assessment & Plan Note (Addendum)
Cough since 1980   01/28/12 CT chest:  Mild bronchiectatic change in the right middle lobe and  lingula. In view of these changes, MAC would be a consideration - HRCT 10/13/2017  1. Scattered mild cylindrical and varicoid bronchiectasis, tree-in-bud opacities and mucoid impaction in both lungs, predominantly in the upper lobes and right middle lobe, mildly progressed since 10/06/2016 chest CT, particularly in anterior left upper lobe. 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 2. Moderate centrilobular emphysema with diffuse bronchial wall thickening, suggesting COPD. - Sinus CT 01/02/18 > Clear paranasal sinuses - 01/04/2018 added prn augmentin for flares  - 04/07/2018 added flutter, stopped macrodantin  - flare since late feb 2021 refractory to abx and pred  > 08/09/2019 acute eval rec cyclical cough rx > resolved   Mild flare assoc with prominent rhinitis features so rec:   Double pred dose until better then resume prior dose as per [facility] or PMR = 5 mg daily  rx with otc's that won't dry out secretions: eg clariton/ allegra  For cough > mucinex dm plus flutter  For breathing > neb saba up to every 4 h prn   Also advised Patient is no longer fully vaccinated and was informed of the seriousness of COVID 19 infection as a direct risk to lung health . I strongly recommended pt take the third pfizer vaccine   which has proven both safe and  effective even against the  delta and new omicron variant to prevent hospitalization and death.    Pulmonary f/u can be prn           Each maintenance medication was reviewed in detail including emphasizing most importantly the difference between maintenance and prns and under what circumstances the prns are to be triggered using an action plan format where appropriate.  Total time for H and P, chart review, counseling, reviewing flutter/ neb use device(s) and generating customized AVS unique to  this office visit / same day charting  > 30 min

## 2020-06-02 NOTE — Progress Notes (Signed)
Spoke with pt and notified of results per Dr. Melvyn Novas. Pt verbalized understanding and denied any questions. Not currently having purulent sputum or fever. Will call back if not back to baseline within 2 wks.

## 2020-06-12 ENCOUNTER — Ambulatory Visit (INDEPENDENT_AMBULATORY_CARE_PROVIDER_SITE_OTHER): Payer: Medicare Other | Admitting: Gastroenterology

## 2020-06-12 ENCOUNTER — Encounter: Payer: Self-pay | Admitting: Gastroenterology

## 2020-06-12 VITALS — BP 140/76 | HR 97 | Ht 68.0 in | Wt 152.0 lb

## 2020-06-12 DIAGNOSIS — R102 Pelvic and perineal pain: Secondary | ICD-10-CM

## 2020-06-12 DIAGNOSIS — R198 Other specified symptoms and signs involving the digestive system and abdomen: Secondary | ICD-10-CM

## 2020-06-12 NOTE — Progress Notes (Signed)
    History of Present Illness: This is an 80 year old female with chronic constipation alternating with diarrhea, pelvic pain, back pain and lower abdominal pain.  She is accompanied by her daughter.  She was evaluated for the same on Jul 26, 2018, please refer to that note.  She notes pelvic and perineal pressure along with back pain when constipated.  Bowel movement relieves symptoms.  She occasionally has to apply pressure to her perirectal and perineal area for bowel movement.  She frequently enters phases of diarrhea after constipation phases.    CT AP IMPRESSION 07/03/2018: 1. No acute abnormalities involving the abdomen or pelvis. 2. Possible laxity of the pelvic floor musculature. 3. Severe descending and sigmoid colon diverticulosis without evidence of acute diverticulitis. 4. Diffuse hepatic steatosis with focal areas of sparing in the periphery of the liver. 5. Small hiatal hernia. 6. Duodenal diverticula. 7. Stable bilateral adrenal adenomas dating back to 2013. 8.  Aortic Atherosclerosis, severe.   Current Medications, Allergies, Past Medical History, Past Surgical History, Family History and Social History were reviewed in [employer] record.   Physical Exam: General: Well developed, well nourished, no acute distress Head: Normocephalic and atraumatic Eyes: Sclerae anicteric, EOMI Ears: Normal auditory acuity Mouth: Not examined, mask on during Covid-19 pandemic Lungs: Clear throughout to auscultation Heart: Regular rate and rhythm; no murmurs, rubs or bruits Abdomen: Soft, non tender and non distended. No masses, hepatosplenomegaly or hernias noted. Normal Bowel sounds Rectal: No external lesions, decreased sphincter tone, decreased squeeze, hard pellet-like stool brown stool in the vault, Hemoccult negative, no tenderness, no internal lesions Musculoskeletal: Symmetrical with no gross deformities  Pulses:  Normal pulses noted Extremities: No  clubbing, cyanosis, edema or deformities noted Neurological: Alert oriented x 4, grossly nonfocal Psychological:  Alert and cooperative. Normal mood and affect   Assessment and Recommendations:  1.  Constipation alternating with diarrhea, pelvic pain, lower abdominal pain. Miralax daily, titrate dose for a complete BM daily. Possible rectocele. GYN evaluation via PCP referral.    2.  Personal history of adenomatous colon polyps.  She declined colonoscopy in May 2020 and declines again today.  Given her age and comorbidities it is reasonable to discontinue surveillance colonoscopies.  She will consider colonoscopy following GYN evaluation.  3.  Hepatic steatosis.  4.  COPD, bronchiectasis.

## 2020-06-12 NOTE — Patient Instructions (Signed)
Start over the counter Miralax mixing 17 grams in 8 oz of water daily. You can titrate up or down depending on your bowel movements.   Thank you for choosing me and [facility].  Pricilla Riffle. Dagoberto Ligas., MD., Marval Regal

## 2020-06-20 ENCOUNTER — Telehealth: Payer: Self-pay | Admitting: Internal Medicine

## 2020-06-20 MED ORDER — AZITHROMYCIN 250 MG PO TABS: ORAL_TABLET | ORAL | 0 refills | Status: DC

## 2020-06-20 MED ORDER — PREDNISONE 5 MG PO TABS: ORAL_TABLET | ORAL | 2 refills | Status: DC

## 2020-06-20 NOTE — Telephone Encounter (Signed)
Called and spoke with both pt and her daughter letting them know recs stated by MW and they verbalized understanding. Verified preferred pharmacy and sent zpak in for pt as well as prednisone Rx as pt was finished with the previous Rx. Nothing further needed.

## 2020-06-20 NOTE — Telephone Encounter (Signed)
Primary Pulmonologist: MW Last office visit and with whom: 05/29/20 with MW What do we see them for (pulmonary problems): bronchiectasis with copd Last OV assessment/plan: For nasal symptoms best options: allegra(fexofenadine) or clariton (loratadine) per bottle    I very strongly recommend you get the  pfizer vaccine as soon as possible based on your risk of dying from the virus  and the proven safety and benefit of these vaccines against even the delta and omicron variants.   Take prednisone 5 mg x 2 with bfast until better then 1 daily   For shortness of breath > nebulizer up to every 4 hours as needed   For cough > mucinex dm up to 1200mg  every 12 hours with flutter valve as much as possible    Please remember to go to the  x-ray department  for your tests - we will call you with the results when they are available      Was appointment offered to patient (explain)?  Patient wanted recommendations    Reason for call:  Called and spoke with patient. She stated that she has been coughing up yellow phlegm for the past 3 days. She has also noticed an slight increase in SOB. She was running a slight fever last night but has not noticed any fevers today. She was placed on a zpak and upped her prednisone at the last visit to 10mg  but she is back at the 5mg  daily. When asked if she had been around sick recently, she stated that one of her great grandchildren had tested positive for the flu earlier this week.   (examples of things to ask: : When did symptoms start? Fever? Cough? Productive? Color to sputum? More sputum than usual? Wheezing? Have you needed increased oxygen? Are you taking your respiratory medications? What over the counter measures have you tried?)  Allergies  Allergen Reactions  . Eggs Or Egg-Derived Products     Skin nodules    Immunization History  Administered Date(s) Administered  . Influenza Whole 12/14/2011, 12/13/2016  . Influenza, High Dose Seasonal PF  11/21/2017  . PFIZER(Purple Top)SARS-COV-2 Vaccination 04/23/2019, 05/21/2019  . Tdap 10/16/2011   Pharmacy is [facility] on [address] MW, can you please advise? Thanks.

## 2020-06-20 NOTE — Telephone Encounter (Signed)
Repeat the zpak Double the prednisone until better then back to prior dose  Ov next week if not better  -  To UC / ER if worse over the weekend

## 2020-06-26 DIAGNOSIS — Z961 Presence of intraocular lens: Secondary | ICD-10-CM | POA: Diagnosis not present

## 2020-06-26 DIAGNOSIS — H4312 Vitreous hemorrhage, left eye: Secondary | ICD-10-CM | POA: Diagnosis not present

## 2020-06-26 DIAGNOSIS — H353230 Exudative age-related macular degeneration, bilateral, stage unspecified: Secondary | ICD-10-CM | POA: Diagnosis not present

## 2020-06-26 DIAGNOSIS — H35361 Drusen (degenerative) of macula, right eye: Secondary | ICD-10-CM | POA: Diagnosis not present

## 2020-07-10 DIAGNOSIS — M353 Polymyalgia rheumatica: Secondary | ICD-10-CM | POA: Diagnosis not present

## 2020-07-10 DIAGNOSIS — M2550 Pain in unspecified joint: Secondary | ICD-10-CM | POA: Diagnosis not present

## 2020-07-10 DIAGNOSIS — R5383 Other fatigue: Secondary | ICD-10-CM | POA: Diagnosis not present

## 2020-07-10 DIAGNOSIS — M545 Low back pain, unspecified: Secondary | ICD-10-CM | POA: Diagnosis not present

## 2020-07-10 DIAGNOSIS — M150 Primary generalized (osteo)arthritis: Secondary | ICD-10-CM | POA: Diagnosis not present

## 2020-08-05 DIAGNOSIS — R739 Hyperglycemia, unspecified: Secondary | ICD-10-CM | POA: Diagnosis not present

## 2020-08-05 DIAGNOSIS — D519 Vitamin B12 deficiency anemia, unspecified: Secondary | ICD-10-CM | POA: Diagnosis not present

## 2020-08-05 DIAGNOSIS — N390 Urinary tract infection, site not specified: Secondary | ICD-10-CM | POA: Diagnosis not present

## 2020-08-05 DIAGNOSIS — N393 Stress incontinence (female) (male): Secondary | ICD-10-CM | POA: Diagnosis not present

## 2020-08-05 DIAGNOSIS — R339 Retention of urine, unspecified: Secondary | ICD-10-CM | POA: Diagnosis not present

## 2020-08-05 DIAGNOSIS — E559 Vitamin D deficiency, unspecified: Secondary | ICD-10-CM | POA: Diagnosis not present

## 2020-08-05 DIAGNOSIS — D849 Immunodeficiency, unspecified: Secondary | ICD-10-CM | POA: Diagnosis not present

## 2020-08-05 DIAGNOSIS — M109 Gout, unspecified: Secondary | ICD-10-CM | POA: Diagnosis not present

## 2020-08-05 DIAGNOSIS — N952 Postmenopausal atrophic vaginitis: Secondary | ICD-10-CM | POA: Diagnosis not present

## 2020-08-05 DIAGNOSIS — E785 Hyperlipidemia, unspecified: Secondary | ICD-10-CM | POA: Diagnosis not present

## 2020-08-12 DIAGNOSIS — E099 Drug or chemical induced diabetes mellitus without complications: Secondary | ICD-10-CM | POA: Diagnosis not present

## 2020-08-12 DIAGNOSIS — J449 Chronic obstructive pulmonary disease, unspecified: Secondary | ICD-10-CM | POA: Diagnosis not present

## 2020-08-12 DIAGNOSIS — Z0000 Encounter for general adult medical examination without abnormal findings: Secondary | ICD-10-CM | POA: Diagnosis not present

## 2020-08-12 DIAGNOSIS — R627 Adult failure to thrive: Secondary | ICD-10-CM | POA: Diagnosis not present

## 2020-08-18 NOTE — Progress Notes (Deleted)
NEUROLOGY FOLLOW UP OFFICE NOTE  Heidi Hobbs [id number]  Assessment/Plan:   1.  Headache - cervicogenic vs/and medication-overuse.  Temporal arteritis ruled out.    ***  Subjective:  Heidi Hobbs is an 80 year old right-handed female with PMR, osteoarthritis, and macular degeneration who follows up for headaches.  UPDATE: Started tizanidine at bedtime in December.  ***  Current steroids/NSAIDS/analgesics:  Tylenol, ASA 66m, prednisone 337mdaily, tramadol 5022murrent muscle relaxants:  Methocarbamol 500m31mD PRN, tizanidine 2mg 92m Current Antihypertensive medications:  HCTZ, losartan, atenolol 50mg,46mralazine Current Antidepressant medications:  Amitriptyline 100mg a29mdtime Current Anticonvulsant medications:  none Current anti-CGRP:  none Current Vitamins/Herbal/Supplements:  D3, B12, priobiotic  HISTORY: She has history of neck pain and bilateral arm pain.  She was found to have cervical spinal stenosis and subsequently underwent C5-6 and C6-7 ACDF in September 2020, however the arm pain did not resolve.  MRI of cervical spine on 12/27/2018 personally reviewed showed ACDF C5-6 and C6-7 with no residual impingement as well as moderate left facet arthritis at C3-4.  She was subsequently diagnosed with polymyalgia rheumatica in January 2021.  Around the same time, she developed a new headache as well.  Temporal arteritis was suspected.  She was initially on high-dose prednisone which has gradually been tapered down over the course of the past several months.  She said the headaches really didn't improve.  She has known macular degeneration in her left eye but over the past year has noted some decreased vision in her right eye.  She also has residual and chronic postpolio vertical diplopia but started experiencing horizontal diplopia.  She underwent right temporal artery biopsy, which was negative.   She [facility].  Notes aren't available but it  appears that she didn't have evidence of an ischemic etiology.  She continues to have headache, described as severe pounding holocephalic pain, particularly in the left occipital region, lasting about 8 hours and occurring daily.  Maybe some photophobia but no nausea or phonophobia.  She reports that her entire scalp, particularly in the back of her head, feels sore and hot to the touch.  MRI of brain with and without contrast on 09/05/2019 showed mild chronic small vessel ischemic changes but no acute abnormalities.  She continues to have some neck pain.  She takes Tylenol and tramadol daily.  She has taken amitriptyline 100mg fo30mdtime for the past 40 years.  She tried gabapentin but it didn't agree with her.   Past medications:  gabapentin  PAST MEDICAL HISTORY: Past Medical History:  Diagnosis Date  . Adenomatous colon polyp 1995, 2011  . Anemia   . Anxiety   . Bronchiectasis (HCC)   .[place] (HCC)   [facility]  . Carotid stenosis    40-59% right, less than 40% left  . Cataracts, bilateral   . COPD (chronic obstructive pulmonary disease) (HCC)   .Xeniaspnea   . Essential hypertension 02/13/2016  . Hyperlipidemia   . Hypertension   . MAC (mycobacterium avium-intracellulare complex)   . Macular degeneration, dry 02/13/2016  . Polio 1959  . Stomach ulcer 1960  . Urinary tract infection   . Vasculitis (HCC)    [place] urticarial   . Vasculitis (HCC) 12/Merrill017   [facility] Medications on File Prior to Visit  Medication Sig Dispense Refill  . albuterol (PROVENTIL) (2.5 MG/3ML) 0.083% nebulizer solution Take 3 mLs (  2.5 mg total) by nebulization every 6 (six) hours as needed for wheezing or shortness of breath. 75 mL 12  . albuterol (VENTOLIN HFA) 108 (90 Base) MCG/ACT inhaler Inhale 2 puffs into the lungs every 4 (four) hours as needed for wheezing or shortness of breath. 8.5 g 2  . ALPRAZolam (XANAX) 0.25  MG tablet Take 0.25 tablets by mouth daily as needed for anxiety.   1  . amitriptyline (ELAVIL) 100 MG tablet Take 100 mg by mouth at bedtime.     Marland Kitchen atenolol (TENORMIN) 50 MG tablet Take 50 mg by mouth daily.    Marland Kitchen atorvastatin (LIPITOR) 40 MG tablet Take 40 mg by mouth every other day. At night    . azithromycin (ZITHROMAX) 250 MG tablet Take two today and then one daily until finished. 6 tablet 0  . cholecalciferol (VITAMIN D3) 25 MCG (1000 UT) tablet Take 1,000 Units by mouth daily.    Marland Kitchen docusate sodium (COLACE) 100 MG capsule You can buy this at any drug store, over the counter and follow package instructions (Patient taking differently: Take 100 mg by mouth daily.) 10 capsule 0  . EPINEPHrine 0.3 mg/0.3 mL IJ SOAJ injection Inject 0.3 mLs into the muscle once as needed for anaphylaxis.     Marland Kitchen ezetimibe (ZETIA) 10 MG tablet Take 10 mg by mouth at bedtime.     . hydrochlorothiazide (HYDRODIURIL) 25 MG tablet Take 25 mg by mouth daily.    Marland Kitchen losartan (COZAAR) 100 MG tablet Take 100 mg by mouth daily.    Marland Kitchen omeprazole (PRILOSEC OTC) 20 MG tablet Take 1 tablet (20 mg total) by mouth daily before breakfast. 30 tablet 3  . predniSONE (DELTASONE) 5 MG tablet Take 2tabs daily in AM until better and then take 1 tab daily. 30 tablet 2  . Probiotic CAPS Take 1 capsule by mouth daily.    Marland Kitchen Respiratory Therapy Supplies (FLUTTER) DEVI 1 Device by Does not apply route 2 (two) times daily. 1 each 0  . traMADol (ULTRAM) 50 MG tablet SMARTSIG:1 Tablet(s) By Mouth Every 12 Hours PRN     Current Facility-Administered Medications on File Prior to Visit  Medication Dose Route Frequency Provider Last Rate Last Admin  . diclofenac sodium (VOLTAREN) 1 % transdermal gel 4 g  4 g Topical QID [name], MD        ALLERGIES: Allergies  Allergen Reactions  . Eggs Or Egg-Derived Products     Skin nodules    FAMILY HISTORY: Family History  Problem Relation Age of Onset  . Colon cancer Father   . Esophageal  cancer Brother   . Esophageal cancer Brother   . Asthma Daughter        as a child  . Stomach cancer Neg Hx   . Rectal cancer Neg Hx       Objective:  *** General: No acute distress.  Patient appears well-groomed.   Head:  Normocephalic/atraumatic Eyes:  Fundi examined but not visualized Neck: supple, no paraspinal tenderness, full range of motion Heart:  Regular rate and rhythm Lungs:  Clear to auscultation bilaterally Back: No paraspinal tenderness Neurological Exam: alert and oriented to person, place, and time.  Speech fluent and not dysarthric, language intact.  CN II-XII intact. Bulk and tone normal, muscle strength 5/5 throughout.  Sensation to light touch intact.  Deep tendon reflexes 2+ throughout.  Finger to nose testing intact.  Gait normal, Romberg negative.   Metta Clines, DO  CC: Shon Baton, MD

## 2020-08-19 ENCOUNTER — Ambulatory Visit: Payer: Medicare Other | Admitting: Neurology

## 2020-08-28 DIAGNOSIS — H5440 Blindness, one eye, unspecified eye: Secondary | ICD-10-CM | POA: Diagnosis not present

## 2020-08-28 DIAGNOSIS — Z91018 Allergy to other foods: Secondary | ICD-10-CM | POA: Diagnosis not present

## 2020-08-28 DIAGNOSIS — I10 Essential (primary) hypertension: Secondary | ICD-10-CM | POA: Diagnosis not present

## 2020-08-28 DIAGNOSIS — Z91012 Allergy to eggs: Secondary | ICD-10-CM | POA: Diagnosis not present

## 2020-08-28 DIAGNOSIS — R059 Cough, unspecified: Secondary | ICD-10-CM | POA: Diagnosis not present

## 2020-08-28 DIAGNOSIS — J471 Bronchiectasis with (acute) exacerbation: Secondary | ICD-10-CM | POA: Diagnosis not present

## 2020-08-28 DIAGNOSIS — E871 Hypo-osmolality and hyponatremia: Secondary | ICD-10-CM | POA: Insufficient documentation

## 2020-08-28 DIAGNOSIS — I6523 Occlusion and stenosis of bilateral carotid arteries: Secondary | ICD-10-CM | POA: Diagnosis not present

## 2020-08-28 DIAGNOSIS — M353 Polymyalgia rheumatica: Secondary | ICD-10-CM | POA: Diagnosis not present

## 2020-08-28 DIAGNOSIS — J9601 Acute respiratory failure with hypoxia: Secondary | ICD-10-CM | POA: Diagnosis not present

## 2020-08-28 DIAGNOSIS — J439 Emphysema, unspecified: Secondary | ICD-10-CM | POA: Diagnosis not present

## 2020-08-28 DIAGNOSIS — B349 Viral infection, unspecified: Secondary | ICD-10-CM | POA: Diagnosis not present

## 2020-08-28 DIAGNOSIS — I6521 Occlusion and stenosis of right carotid artery: Secondary | ICD-10-CM | POA: Diagnosis not present

## 2020-08-28 DIAGNOSIS — F17210 Nicotine dependence, cigarettes, uncomplicated: Secondary | ICD-10-CM | POA: Diagnosis not present

## 2020-08-28 DIAGNOSIS — R262 Difficulty in walking, not elsewhere classified: Secondary | ICD-10-CM | POA: Diagnosis not present

## 2020-08-28 DIAGNOSIS — J90 Pleural effusion, not elsewhere classified: Secondary | ICD-10-CM | POA: Diagnosis not present

## 2020-08-28 DIAGNOSIS — I779 Disorder of arteries and arterioles, unspecified: Secondary | ICD-10-CM | POA: Diagnosis not present

## 2020-08-28 DIAGNOSIS — R519 Headache, unspecified: Secondary | ICD-10-CM | POA: Diagnosis not present

## 2020-08-28 DIAGNOSIS — H3530 Unspecified macular degeneration: Secondary | ICD-10-CM | POA: Diagnosis not present

## 2020-08-28 DIAGNOSIS — J9600 Acute respiratory failure, unspecified whether with hypoxia or hypercapnia: Secondary | ICD-10-CM | POA: Insufficient documentation

## 2020-08-28 DIAGNOSIS — J449 Chronic obstructive pulmonary disease, unspecified: Secondary | ICD-10-CM | POA: Diagnosis not present

## 2020-08-28 DIAGNOSIS — Z20822 Contact with and (suspected) exposure to covid-19: Secondary | ICD-10-CM | POA: Diagnosis not present

## 2020-08-28 DIAGNOSIS — J984 Other disorders of lung: Secondary | ICD-10-CM | POA: Diagnosis not present

## 2020-08-28 DIAGNOSIS — Z7982 Long term (current) use of aspirin: Secondary | ICD-10-CM | POA: Diagnosis not present

## 2020-08-28 DIAGNOSIS — E782 Mixed hyperlipidemia: Secondary | ICD-10-CM | POA: Diagnosis not present

## 2020-08-28 DIAGNOSIS — R0902 Hypoxemia: Secondary | ICD-10-CM | POA: Diagnosis not present

## 2020-08-28 DIAGNOSIS — J8410 Pulmonary fibrosis, unspecified: Secondary | ICD-10-CM | POA: Diagnosis not present

## 2020-08-28 DIAGNOSIS — Z79899 Other long term (current) drug therapy: Secondary | ICD-10-CM | POA: Diagnosis not present

## 2020-08-29 ENCOUNTER — Ambulatory Visit: Payer: Medicare Other | Admitting: Internal Medicine

## 2020-09-02 ENCOUNTER — Ambulatory Visit: Payer: Medicare Other | Admitting: Internal Medicine

## 2020-09-09 ENCOUNTER — Inpatient Hospital Stay: Payer: Medicare Other | Admitting: Primary Care

## 2020-09-09 NOTE — Progress Notes (Deleted)
_0  ID: Heidi Hobbs, female    DOB: August 16, 1940, 80 y.o.   MRN: [id number]  No chief complaint on file.   Referring provider: Shon Baton, MD  HPI: 80 year old female, former smoker quit in 2019 (25-pack-year history).  Medical history significant for bronchiectasis.  [name].  09/09/2020- Interim  Patient presents today for hospital follow-up.  She was admitted from 08/28/2020 - 08/31/2020 for acute respiratory failure with hypoxia, COPD and bronchiectasis with acute exacerbation.  She originally presented with a fever of 101.9 and productive cough with yellow sputum.  O2 sat was 88% on room air but dropped to 82% with exertion.  Negative for COVID RSV and flu.  Patient was placed on 2 L nasal cannula.  CTA chest showed moderate emphysema, diffuse bronchial wall thickening with areas of endobronchial mucous plugging, multifocal opacities suggestive of left lower lobe pneumonia and small left pleural effusion.  Patient was discharged on prednisone and Omnicef/doxycycline.    Allergies  Allergen Reactions   Eggs Or Egg-Derived Products     Skin nodules    Immunization History  Administered Date(s) Administered   Influenza Whole 12/14/2011, 12/13/2016   Influenza, High Dose Seasonal PF 11/21/2017   PFIZER(Purple Top)SARS-COV-2 Vaccination 04/23/2019, 05/21/2019   Tdap 10/16/2011    Past Medical History:  Diagnosis Date   Adenomatous colon polyp 1995, 2011   Anemia    Anxiety    Bronchiectasis ([place])    Cancer ([place])    uterine cancer   Carotid stenosis    40-59% right, less than 40% left   Cataracts, bilateral    COPD (chronic obstructive pulmonary disease) (HCC)    Dyspnea    Essential hypertension 02/13/2016   Hyperlipidemia    Hypertension    MAC (mycobacterium avium-intracellulare complex)    Macular degeneration, dry 02/13/2016   Polio 1959   Stomach ulcer 1960   Urinary tract infection    Vasculitis ([place])    Normocomplementemic urticarial     Vasculitis ([place]) 02/13/2016   Followed by [facility]     Tobacco History: Social History   Tobacco Use  Smoking Status Former   Packs/day: 0.50   Years: 50.00   Pack years: 25.00   Types: Cigarettes   Quit date: 08/01/2017   Years since quitting: 3.1  Smokeless Tobacco Never   Counseling given: Not Answered   Outpatient Medications Prior to Visit  Medication Sig Dispense Refill   albuterol (PROVENTIL) (2.5 MG/3ML) 0.083% nebulizer solution Take 3 mLs (2.5 mg total) by nebulization every 6 (six) hours as needed for wheezing or shortness of breath. 75 mL 12   albuterol (VENTOLIN HFA) 108 (90 Base) MCG/ACT inhaler Inhale 2 puffs into the lungs every 4 (four) hours as needed for wheezing or shortness of breath. 8.5 g 2   ALPRAZolam (XANAX) 0.25 MG tablet Take 0.25 tablets by mouth daily as needed for anxiety.   1   amitriptyline (ELAVIL) 100 MG tablet Take 100 mg by mouth at bedtime.      atenolol (TENORMIN) 50 MG tablet Take 50 mg by mouth daily.     atorvastatin (LIPITOR) 40 MG tablet Take 40 mg by mouth every other day. At night     azithromycin (ZITHROMAX) 250 MG tablet Take two today and then one daily until finished. 6 tablet 0   cholecalciferol (VITAMIN D3) 25 MCG (1000 UT) tablet Take 1,000 Units by mouth daily.     docusate sodium (COLACE) 100 MG capsule You can buy this  at any drug store, over the counter and follow package instructions (Patient taking differently: Take 100 mg by mouth daily.) 10 capsule 0   EPINEPHrine 0.3 mg/0.3 mL IJ SOAJ injection Inject 0.3 mLs into the muscle once as needed for anaphylaxis.      ezetimibe (ZETIA) 10 MG tablet Take 10 mg by mouth at bedtime.      hydrochlorothiazide (HYDRODIURIL) 25 MG tablet Take 25 mg by mouth daily.     losartan (COZAAR) 100 MG tablet Take 100 mg by mouth daily.     omeprazole (PRILOSEC OTC) 20 MG tablet Take 1 tablet (20 mg total) by mouth daily before breakfast. 30 tablet 3   predniSONE (DELTASONE) 5 MG  tablet Take 2tabs daily in AM until better and then take 1 tab daily. 30 tablet 2   Probiotic CAPS Take 1 capsule by mouth daily.     Respiratory Therapy Supplies (FLUTTER) DEVI 1 Device by Does not apply route 2 (two) times daily. 1 each 0   traMADol (ULTRAM) 50 MG tablet SMARTSIG:1 Tablet(s) By Mouth Every 12 Hours PRN     Facility-Administered Medications Prior to Visit  Medication Dose Route Frequency Provider Last Rate Last Admin   diclofenac sodium (VOLTAREN) 1 % transdermal gel 4 g  4 g Topical QID [name], MD          Review of Systems  Review of Systems   Physical Exam  There were no vitals taken for this visit. Physical Exam   Lab Results:  CBC    Component Value Date/Time   WBC 18.9 (H) 08/06/2019 1334   RBC 5.10 08/06/2019 1334   HGB 15.5 (H) 08/06/2019 1334   HCT 45.7 08/06/2019 1334   PLT 260 08/06/2019 1334   MCV 89.6 08/06/2019 1334   MCH 30.4 08/06/2019 1334   MCHC 33.9 08/06/2019 1334   RDW 16.3 (H) 08/06/2019 1334   LYMPHSABS 1.4 08/06/2019 1334   MONOABS 0.8 08/06/2019 1334   EOSABS 0.0 08/06/2019 1334   BASOSABS 0.1 08/06/2019 1334    BMET    Component Value Date/Time   NA 135 08/14/2019 1500   K 3.7 08/14/2019 1500   CL 94 (L) 08/14/2019 1500   CO2 27 08/14/2019 1500   GLUCOSE 251 (H) 08/14/2019 1500   BUN 11 08/14/2019 1500   CREATININE 0.77 08/14/2019 1500   CALCIUM 8.6 (L) 08/14/2019 1500   GFRNONAA >60 08/14/2019 1500   GFRAA >60 08/14/2019 1500    BNP    Component Value Date/Time   BNP 72.6 08/06/2019 1334    ProBNP No results found for: PROBNP  Imaging: No results found.   Assessment & Plan:   No problem-specific Assessment & Plan notes found for this encounter.     Martyn Ehrich, NP 09/09/2020

## 2020-09-17 ENCOUNTER — Other Ambulatory Visit: Payer: Self-pay

## 2020-09-17 DIAGNOSIS — I6523 Occlusion and stenosis of bilateral carotid arteries: Secondary | ICD-10-CM

## 2020-09-23 ENCOUNTER — Ambulatory Visit (HOSPITAL_COMMUNITY)
Admission: RE | Admit: 2020-09-23 | Discharge: 2020-09-23 | Disposition: A | Discharge status: Home / Self Care | Payer: Medicare Other | Source: Ambulatory Visit | Attending: Vascular Surgery | Admitting: Vascular Surgery

## 2020-09-23 ENCOUNTER — Encounter: Payer: Self-pay | Admitting: Vascular Surgery

## 2020-09-23 ENCOUNTER — Ambulatory Visit (INDEPENDENT_AMBULATORY_CARE_PROVIDER_SITE_OTHER): Payer: Medicare Other | Admitting: Vascular Surgery

## 2020-09-23 ENCOUNTER — Other Ambulatory Visit: Payer: Self-pay

## 2020-09-23 VITALS — BP 145/74 | HR 75 | Temp 97.5°F | Resp 16 | Ht 68.0 in | Wt 143.0 lb

## 2020-09-23 DIAGNOSIS — N390 Urinary tract infection, site not specified: Secondary | ICD-10-CM | POA: Diagnosis not present

## 2020-09-23 DIAGNOSIS — I6523 Occlusion and stenosis of bilateral carotid arteries: Secondary | ICD-10-CM | POA: Diagnosis not present

## 2020-09-23 NOTE — Progress Notes (Signed)
Patient name: Heidi Hobbs MRN: [id number] DOB: March 15, 1941 Sex: female  REASON FOR VISIT: 1 year follow-up for carotid surveillance  HPI: Heidi Hobbs is a 80 y.o. female that presents for interval 1 year follow-up for carotid surveillance.  We have been following her right carotid where she has had elevated velocities consistent with a 40 to 60% stenosis.  She was hospitalized last month after she had confusion.  Part of that she underwent a stroke work-up.  CTA neck on 08/28/20 and although I do not have the images the report in care everywhere states 64% right ICA stenosis.  She was found to have no acute strokes.  Ultimately this was felt to be pneumonia.  She still smoking.  No other focal neurologic events.  Past Medical History:  Diagnosis Date   Adenomatous colon polyp 1995, 2011   Anemia    Anxiety    Bronchiectasis ([place])    Cancer ([place])    uterine cancer   Carotid stenosis    40-59% right, less than 40% left   Cataracts, bilateral    COPD (chronic obstructive pulmonary disease) ([place])    Dyspnea    Essential hypertension 02/13/2016   Hyperlipidemia    Hypertension    MAC (mycobacterium avium-intracellulare complex)    Macular degeneration, dry 02/13/2016   Polio 1959   Stomach ulcer 1960   Urinary tract infection    Vasculitis ([place])    Normocomplementemic urticarial    Vasculitis ([place]) 02/13/2016   Followed by [facility]     Past Surgical History:  Procedure Laterality Date   ANTERIOR CERVICAL DECOMP/DISCECTOMY FUSION N/A 11/27/2018   Procedure: CERVICAL FIVE TO SIX , CERVICAL SIX TO SEVEN, ANTERIOR CERVICAL DECOMPRESSION/DISCECTOMY FUSION, ALLOGRAFT, PLATE;  Surgeon: [name], MD;  Location: [facility];  Service: Orthopedics;  Laterality: N/A;   APPENDECTOMY     rupture appendix   ARTERY BIOPSY N/A 08/17/2019   Procedure: BIOPSY TEMPORAL ARTERY;  Surgeon: Leta Baptist, MD;  Location: [facility];  Service: ENT;  Laterality: N/A;   BREAST  ENHANCEMENT SURGERY     removal   CATARACT EXTRACTION     COLONOSCOPY     COLONOSCOPY  10/14/09   LAPAROSCOPIC APPENDECTOMY N/A 01/23/2016   Procedure: APPENDECTOMY LAPAROSCOPIC;  Surgeon: Erroll Luna, MD;  Location: [facility];  Service: General;  Laterality: N/A;   POLYPECTOMY  2011   x8   TONSILLECTOMY AND ADENOIDECTOMY     VAGINAL HYSTERECTOMY  1970   uterine cancer    Family History  Problem Relation Age of Onset   Colon cancer Father    Esophageal cancer Brother    Esophageal cancer Brother    Asthma Daughter        as a child   Stomach cancer Neg Hx    Rectal cancer Neg Hx     SOCIAL HISTORY: Social History   Tobacco Use   Smoking status: Former    Packs/day: 0.50    Years: 50.00    Pack years: 25.00    Types: Cigarettes    Quit date: 08/01/2017    Years since quitting: 3.1   Smokeless tobacco: Never  Substance Use Topics   Alcohol use: No    Alcohol/week: 0.0 standard drinks    Allergies  Allergen Reactions   Eggs Or Egg-Derived Products     Skin nodules    Current Outpatient Medications  Medication Sig Dispense Refill   albuterol (PROVENTIL) (2.5 MG/3ML) 0.083% nebulizer solution Take 3  mLs (2.5 mg total) by nebulization every 6 (six) hours as needed for wheezing or shortness of breath. 75 mL 12   albuterol (VENTOLIN HFA) 108 (90 Base) MCG/ACT inhaler Inhale 2 puffs into the lungs every 4 (four) hours as needed for wheezing or shortness of breath. 8.5 g 2   ALPRAZolam (XANAX) 0.25 MG tablet Take 0.25 tablets by mouth daily as needed for anxiety.   1   amitriptyline (ELAVIL) 100 MG tablet Take 100 mg by mouth at bedtime.      atenolol (TENORMIN) 50 MG tablet Take 50 mg by mouth daily.     atorvastatin (LIPITOR) 40 MG tablet Take 40 mg by mouth every other day. At night     azithromycin (ZITHROMAX) 250 MG tablet Take two today and then one daily until finished. 6 tablet 0   cholecalciferol (VITAMIN D3) 25 MCG (1000 UT) tablet Take 1,000 Units by mouth daily.      docusate sodium (COLACE) 100 MG capsule You can buy this at any drug store, over the counter and follow package instructions (Patient taking differently: Take 100 mg by mouth daily.) 10 capsule 0   EPINEPHrine 0.3 mg/0.3 mL IJ SOAJ injection Inject 0.3 mLs into the muscle once as needed for anaphylaxis.      ezetimibe (ZETIA) 10 MG tablet Take 10 mg by mouth at bedtime.      hydrochlorothiazide (HYDRODIURIL) 25 MG tablet Take 25 mg by mouth daily.     losartan (COZAAR) 100 MG tablet Take 100 mg by mouth daily.     omeprazole (PRILOSEC OTC) 20 MG tablet Take 1 tablet (20 mg total) by mouth daily before breakfast. 30 tablet 3   Probiotic CAPS Take 1 capsule by mouth daily.     Respiratory Therapy Supplies (FLUTTER) DEVI 1 Device by Does not apply route 2 (two) times daily. 1 each 0   traMADol (ULTRAM) 50 MG tablet SMARTSIG:1 Tablet(s) By Mouth Every 12 Hours PRN     predniSONE (DELTASONE) 5 MG tablet Take 2tabs daily in AM until better and then take 1 tab daily. (Patient not taking: Reported on 09/23/2020) 30 tablet 2   Current Facility-Administered Medications  Medication Dose Route Frequency Provider Last Rate Last Admin   diclofenac sodium (VOLTAREN) 1 % transdermal gel 4 g  4 g Topical QID [name], MD        REVIEW OF SYSTEMS:  _0  denotes positive finding, _1  denotes negative finding Cardiac  Comments:  Chest pain or chest pressure:    Shortness of breath upon exertion:    Short of breath when lying flat:    Irregular heart rhythm:        Vascular    Pain in calf, thigh, or hip brought on by ambulation:    Pain in feet at night that wakes you up from your sleep:     Blood clot in your veins:    Leg swelling:         Pulmonary    Oxygen at home:    Productive cough:     Wheezing:         Neurologic    Sudden weakness in arms or legs:     Sudden numbness in arms or legs:     Sudden onset of difficulty speaking or slurred speech:    Temporary loss of vision in one  eye:     Problems with dizziness:         Gastrointestinal    Blood  in stool:     Vomited blood:         Genitourinary    Burning when urinating:     Blood in urine:        Psychiatric    Major depression:         Hematologic    Bleeding problems:    Problems with blood clotting too easily:        Skin    Rashes or ulcers:        Constitutional    Fever or chills:      PHYSICAL EXAM: Vitals:   09/23/20 1003 09/23/20 1006  BP: (!) 132/53 (!) 145/74  Pulse: 75 75  Resp: 16   Temp: (!) 97.5 F (36.4 C)   TempSrc: Temporal   SpO2: 94%   Weight: 143 lb (64.9 kg)   Height: _0  (1.727 m)     GENERAL: The patient is a well-nourished female, in no acute distress. The vital signs are documented above. CARDIAC: There is a regular rate and rhythm.  PULMONARY: No respiratory distress. ABDOMEN: Soft and non-tender. MUSCULOSKELETAL: There are no major deformities or cyanosis. NEUROLOGIC: No focal weakness or paresthesias are detected. CN II-XII grossly intact. SKIN: There are no ulcers or rashes noted. PSYCHIATRIC: The patient has a normal affect.  DATA:   Independently reviewed her carotid duplex and right ICA velocities consistent with 40-59% stenosis and left ICA velocities 1-39% stenosis.  CTA neck report from [place] suggest 64% right ICA stenosis  Assessment/Plan:  80 year-old female who presents for ongoing carotid surveillance in the setting of a known 40 to 59% right ICA stenosis.  This is her 1 year follow-up today.  Again her carotid studies show 40 to 59% stenosis in the right ICA.  She did have a CTA earlier this year that suggested a 64% stenosis by report although I do not have the images.  Again I think her carotid disease is asymptomatic and discussed we would not offer surgery unless it is more than 80%.  Discussed the importance of aspirin and smoking cessation.  I will see her again in 1 year.  Marty Heck, MD [facility] Office: [phone]  Marty Heck   Marty Heck

## 2020-09-25 DIAGNOSIS — H4312 Vitreous hemorrhage, left eye: Secondary | ICD-10-CM | POA: Diagnosis not present

## 2020-09-25 DIAGNOSIS — Z961 Presence of intraocular lens: Secondary | ICD-10-CM | POA: Diagnosis not present

## 2020-09-25 DIAGNOSIS — H353230 Exudative age-related macular degeneration, bilateral, stage unspecified: Secondary | ICD-10-CM | POA: Diagnosis not present

## 2020-10-02 DIAGNOSIS — H4312 Vitreous hemorrhage, left eye: Secondary | ICD-10-CM | POA: Diagnosis not present

## 2020-10-02 DIAGNOSIS — Z961 Presence of intraocular lens: Secondary | ICD-10-CM | POA: Diagnosis not present

## 2020-10-02 DIAGNOSIS — H353231 Exudative age-related macular degeneration, bilateral, with active choroidal neovascularization: Secondary | ICD-10-CM | POA: Diagnosis not present

## 2020-10-02 DIAGNOSIS — H04123 Dry eye syndrome of bilateral lacrimal glands: Secondary | ICD-10-CM | POA: Diagnosis not present

## 2020-10-06 ENCOUNTER — Encounter: Payer: Self-pay | Admitting: Internal Medicine

## 2020-10-06 ENCOUNTER — Ambulatory Visit (INDEPENDENT_AMBULATORY_CARE_PROVIDER_SITE_OTHER): Payer: Medicare Other

## 2020-10-06 ENCOUNTER — Ambulatory Visit (INDEPENDENT_AMBULATORY_CARE_PROVIDER_SITE_OTHER): Payer: Medicare Other | Admitting: Internal Medicine

## 2020-10-06 ENCOUNTER — Other Ambulatory Visit: Payer: Self-pay

## 2020-10-06 DIAGNOSIS — J479 Bronchiectasis, uncomplicated: Secondary | ICD-10-CM | POA: Diagnosis not present

## 2020-10-06 DIAGNOSIS — J9811 Atelectasis: Secondary | ICD-10-CM | POA: Diagnosis not present

## 2020-10-06 MED ORDER — DOXYCYCLINE HYCLATE 100 MG PO TABS: 100.0000 mg | ORAL_TABLET | Freq: Two times a day (BID) | ORAL | 11 refills | Status: DC

## 2020-10-06 NOTE — Progress Notes (Signed)
Subjective:   Patient ID: Heidi Hobbs, female    DOB: 10-May-1940     MRN: [id number]   Brief patient profile:  62 yowf quit smoking 07/2017 with  Chronic cough since 1980 limited by sob then breathing worse in April 2014 while being being treated by  Dr Heidi Hobbs  For hives, rx with prn Ventolin but didn't help resting sob and dx with bronchiectasis and GOLD I copd criteria 09/27/12   H/o Exposure to chlorine gas in 1980's did not required hospitalization > w/in  Few years started getting recurrent pna  Stopped taking daily macrodantin around 03/2017  Has immunodef f/u by [facility]   History of Present Illness  08/17/2012 1st pulmonary cc indolent cough x sev years better when stops smoking, not limited by breathing. Cough is mostly clear more p shower, and in ams also but doesn't disturb sleep. rec Mucinex or mucinex dm prn     12/05/2017  f/u ov/Heidi Hobbs re: COPD GOLD I/ bronchiectasis  Chief Complaint  Patient presents with   Follow-up    went to UC approx 4 wks ago and was told she had PNA-txed with pred and doxy. She states her breathing is doing okay. She is having some left side back pain. She rareley uses her albuterol.   Dyspnea:  MMRC2 = can't walk a nl pace on a flat grade s sob but does fine slow and flat  Cough: variable cough yellow and green better doxy with assoc sinus congestion, some worse since stopped doxy as is  assoc L post cp  Sleeping: one pillow bed flat  SABA use: rarely   rec Augmentin 875 mg take one pill twice daily  X 10 days - take at breakfast and supper with large glass of water.  It would help reduce the usual side effects (diarrhea and yeast infections) if you ate cultured yogurt at lunch.  Bronchiectasis =   you have scarring of your bronchial tubes  Whenever you develop cough congestion take mucinex or mucinex dm  1200 mg every 12 hours > these will help keep the mucus loose  sinus CT in 2 weeks ->>> all clear   01/04/2018  f/u ov/Heidi Hobbs re:  bronchiectasis / better p course of augmentin  Chief Complaint  Patient presents with   Follow-up    Cough has improved  some.  She occ produces some yellow sputum.  She rarely uses her albuterol inhaler.   Dyspnea:  mmrc2 Cough: first thing in am  Sleeping: 2 pillow /flat bed  SABA use: rarely  02: none  rec For cough / congestion >  mucinex or mucinex dm up to 1200 mg every 12 hours as needed For nasty mucus  >  Augmentin 875 mg take one pill twice daily  X 10 days - take at breakfast and supper with large glass of water.  It would help reduce the usual side effects (diarrhea and yeast infections) if you ate cultured yogurt at lunch.     04/07/2018  f/u ov/Heidi Hobbs re: bronchiectasis / just started back on macrodantin Chief Complaint  Patient presents with   Follow-up    Breathing is doing well. She is coughing alot at night- admits to eating alot of chocolate at night.   Dyspnea:  MMRC2 = can't walk a nl pace on a flat grade s sob but does fine slow and flat ht ok  Cough: worse at hs and in am eating lots of kisses at bedside Sleeping: flat bed  SABA use:  none  02: none  rec Cipro 500 mg twice daily with meals x one week and stop the macrodantin  Cough > mucinex 1200 mg every 12 hours as needed and flutter valve as much as possible  6-8 in bed blocks very important - see below   GERD    06/08/19  Flare x 4-6 weeks > eval Heidi Edelman NP  rx doxy  07/30/19  Flare rx Heidi Hobbs another doxy  07/17/19 ER cxr > atx bases   08/09/2019  Acute extended  ov/Heidi Hobbs re: still flare of coughing with dx bronchiectasis / ? Giant cell arthritis for bx in  8 days  Chief Complaint  Patient presents with   Acute Visit    pt is coughing sob, pt coughing up claer mucus  Dyspnea:  Mostly when coughing  Cough: severe 24/7 coughing fits with minimal clear mucus production despite on high dose pred for ? PMR  Taking ppi at hs and h2 in am  Only relief she gets from cough is p takes tramadol but only uses once or  twice a day rec Change :  Try prilosec otc '20mg'$   Take 30-60 min before first meal of the day and Pepcid ac (famotidine) 20 mg one @  Bedtime  6-8 inch bed blocks  For cough mucinex dm up to 1200 mg every 12 hours and use flutter valve as much as possible when coughing  If still coughing, do the above plus add the tramadol 50 mg every 4 hours as needed For shortness of breath > albuterol neb or spray up to every 4 hours as needed  GERD diet   For mouth sore > clotrimazole troche 10 mg up to 5 x daily      05/29/2020  f/u ov/Heidi Hobbs re: bronchiectasis  Flare x 1-2 weeks Chief Complaint  Patient presents with   Follow-up    Pt c/o increased cough, runny nose, nasal congestion. She is coughing up some clear sputum.    Dyspnea:  Across the room / back on pred 5 mg daily x 3 weeks prior to OV  For PMR Cough: assoc with nasal congestion / no purulent secretions  Sleeping: 45 degrees bed = baseline SABA use: not using neb 02: none  Covid status:   2nd Pfizer 05/2019  No 3rd shot yet  Rec For nasal symptoms best options: allegra(fexofenadine) or clariton (loratadine) per bottle  I very strongly recommend you get the  pfizer vaccine   take prednisone 5 mg x 2 with bfast until better then 1 daily  For shortness of breath > nebulizer up to every 4 hours as needed  For cough > mucinex dm up to '1200mg'$  every 12 hours with flutter valve as much as possible    One week prior to June 16th 2022  confused/ sob > admitted to [facility] date: 08/28/2020 Discharge date: 08/31/2020  Hospital LOS: 3 days  Active Hospital Problems     *Acute respiratory failure with hypoxia (*) 08/28/2020 Yes   COPD (chronic obstructive pulmonary disease) (*) 08/28/2020 Yes   Bronchiectasis with acute exacerbation (*) 08/28/2020 Yes   Carotid arterial disease (*) 09/14/2010 Yes   Hypertension 09/14/2010 Yes   Hyponatremia 08/28/2020 Yes   Dyslipidemia 09/14/2010 Yes   Resolved Hospital Problems  No resolved  problems to display.   Discharge Medication List as of 08/31/2020 12:16 PM   NEW medications  Details  cefdinir (OMNICEF) 300 mg capsule Take one capsule (300 mg dose) by mouth 2 (two)  times daily for 5 days., Starting Sun 08/31/2020, Until Fri 09/05/2020, Normal   doxycycline monohydrate (MONODOX) 100 mg capsule Take one capsule (100 mg dose) by mouth every 12 (twelve) hours for 3 days., Starting Sun 08/31/2020, Until Wed 09/03/2020, Normal   predniSONE (DELTASONE) 10 mg tablet 4 tab po qd x 3d then 3 tab po qd x 3d then 2 tab po qd x 3 day then 1 tab po qd x 3 day then 1/2 tab po qd x 4days, Normal   traMADol (ULTRAM) 50 mg tablet Take one tablet (50 mg dose) by mouth 3 (three) times a day for 3 days., Starting Sun 08/31/2020, Until Wed 09/03/2020, OTC    CONTINUED medications  Details  acetaminophen (TYLENOL) 325 mg tablet every 4 (four) hours., Historical Med   albuterol (ACCUNEB) 0.63 mg/3 mL nebulizer solution Use at least twice daily x 2 weeks. Then every 4 hours as needed, Historical Med   albuterol sulfate HFA 108 (90 BASE) MCG/ACT inhaler Inhale two puffs into the lungs every 4 (four) hours as needed for Wheezing., Starting Thu 06/30/2017, Normal   alprazolam (NIRAVAM) 0.25 MG dissolvable tablet Take 0.25 mg by mouth as needed., Historical Med   amitriptyline (ELAVIL) 100 MG tablet Take 100 mg by mouth nightly., Historical Med   aspirin 81 MG EC tablet Take 81 mg by mouth daily., Historical Med   atenolol (TENORMIN) 50 MG tablet Take 50 mg by mouth daily., Historical Med   atorvastatin (LIPITOR) 40 mg tablet Take 40 mg by mouth every other day., Starting Wed 07/09/2020, Historical Med   !! Cholecalciferol (VITAMIN D3) 50 MCG (2000 UT) TABS 1 tablet, Historical Med   !! Cholecalciferol 25 MCG (1000 UT) tablet Take 1,000 Units by mouth daily., Historical Med   docusate sodium (COLACE,DOK,DOCQLACE) capsule Take 1 capsule by mouth as needed., Starting Sun 02/01/2016, Historical Med    EPINEPHrine (AUVI-Q,EPIPEN) 0.3 mg/0.3 mL injection Inject 1 pen into the muscle as needed., Historical Med   estradiol (ESTRACE) 0.1 mg/gram vaginal cream Place 1 gram vaginally every night for 2 weeks, then every other night, Historical Med   ezetimibe (ZETIA) 10 MG tablet Take 10 mg by mouth daily., Historical Med   hydrALAZINE HCl (APRESOLINE) 25 mg tablet SMARTSIG:1 Tablet(s) By Mouth Every 12 Hours, Historical Med   hydrochlorothiazide (HYDRODIURIL) 25 mg tablet Take 25 mg by mouth daily., Starting Thu 08/21/2020, Historical Med   levocetirizine (XYZAL) 5 MG tablet Take 5 mg by mouth as needed., Starting Sat 01/03/2016, Historical Med   losartan potassium (COZAAR) 100 mg tablet Take 100 mg by mouth daily., Starting Thu 08/21/2020, Historical Med   losartan-hydrochlorothiazide (HYZAAR) 100-25 MG per tablet Take 1 tablet by mouth daily., Historical Med   Multiple Vitamins-Minerals (PRESERVISION AREDS 2) CAPS Take by mouth., Historical Med   vitamin B-12 (CYANOCOBALAMIN) 1000 mcg tablet 1 tablet, Historical Med   !! - Potential duplicate medications found. Please discuss with provider.    Hospital Course  Physicians involved in care during this hospitalization Attending Provider: Aida Puffer, MD Admitting Provider: Aida Puffer, MD  Indication for Admission:  Acute hypoxic respiratory failure COPD Bronchiectasis with acute exacerbation  History of Present Illness: 80 year old female with a history of bronchiectasis, hypertension, hyperlipidemia and carotid artery disease who presents to the emergency department with 2 days of fever to 101.9, cough productive of yellow sputum, nausea, and significant weakness. In the emergency department temp 99.2, sats were down to 88% at rest but would drop to 82%  with exertion per ED provider. WBC 17.4 with 74% neutrophils. Negative for COVID RSV and flu. Sodium 131, potassium 3.4, creatinine 0.52, LFTs normal. Urinalysis  benign. ABG 36/7.48/72 on room air. Patient placed on 2 L nasal cannula. CT of head without acute abnormality. CTPA with moderate emphysema, diffuse bronchial wall thickening with areas of endobronchial mucous plugging, multifocal opacities suggestive of pneumonia in the left lower lobe, lingula and small left pleural effusion. Indeterminate left adrenal gland nodules. She additionally had a CTA of the head and neck which revealed a moderately severe stenosis in the right internal carotid artery.  Hospital Course:  [facility] Progress Note  Reason for Visit/chief complaint: [facility]  Assessment/Plan: Active Hospital Problems *Acute respiratory failure with hypoxia (*) COPD (chronic obstructive pulmonary disease) (*) Bronchiectasis with acute exacerbation (*) Bio fire negative. COVID-negative. Patient's cough was treated with several as needed antitussives. She was kept an additional night after coming off supplemental oxygen due to her wheezing. On the day of discharge her wheezing had significantly improved. Patient required IV Solu-Medrol and continued empiric antibiotics. Patient to be discharged on prednisone and Omnicef/doxycycline Urinary retention Patient was found to have urinary retention and this was revealed on CT scan done on the day of admission. It was felt that her profound constipation likely led to her urinary retention. She had an In-N-Out cath with 700 cc of urine removed. She underwent aggressive treatment for her constipation and bladder scans showed less than 200. Constipation Patient stated has been 5 days since she has had a bowel movement. She was treated with MiraLAX as well as Dulcolax suppository without significant improvement. She was given 2 suppositories and lactulose. She declined an enema. She did have a fairly good bowel movement and stated she would prefer to continue treatment at home. Carotid arterial disease (*) This was reviewed with patient and daughter. She  had stopped her aspirin at some time in the past for unknown reasons. She is to resume aspirin and followup vascular surgery as OP Hypertension Continue home blood pressure medication  Dyslipidemia Continue statin/zetia.     10/06/2020  f/u ov/Azrael Huss re: bronchiectasis  Chief Complaint  Patient presents with   Follow-up    Patient was in the hospital on 08/28/20 at [facility]. Notes are in the system.    Dyspnea:  room to room off 02 Cough: better / less yellow / using flutter some Sleeping: elcectric bed 30 degrees  SABA use: not needing  02: none  Covid status:   vax x 2    No obvious day to day or daytime variability or assoc e mucus plugs or hemoptysis or cp or chest tightness, subjective wheeze or overt sinus or hb symptoms.   Sleeping as above  without nocturnal  or early am exacerbation  of respiratory  c/o's or need for noct saba. Also denies any obvious fluctuation of symptoms with weather or environmental changes or other aggravating or alleviating factors except as outlined above   No unusual exposure hx or h/o childhood pna/ asthma or knowledge of premature birth.  Current Allergies, Complete Past Medical History, Past Surgical History, Family History, and Social History were reviewed in [employer] record.  ROS  The following are not active complaints unless bolded Hoarseness, sore throat, dysphagia, dental problems, itching, sneezing,  nasal congestion or discharge of excess mucus or purulent secretions, ear ache,   fever, chills, sweats, unintended wt loss or wt gain, classically pleuritic or exertional cp,  orthopnea pnd or arm/hand  swelling  or leg swelling, presyncope, palpitations, abdominal pain, anorexia, nausea, vomiting, diarrhea  or change in bowel habits or change in bladder habits, change in stools or change in urine, dysuria, hematuria,  rash, arthralgias, visual complaints, headache, numbness, weakness or ataxia or problems with walking or  coordination,  change in mood or  memory.        Current Meds  Medication Sig   albuterol (PROVENTIL) (2.5 MG/3ML) 0.083% nebulizer solution Take 3 mLs (2.5 mg total) by nebulization every 6 (six) hours as needed for wheezing or shortness of breath.   albuterol (VENTOLIN HFA) 108 (90 Base) MCG/ACT inhaler Inhale 2 puffs into the lungs every 4 (four) hours as needed for wheezing or shortness of breath.   ALPRAZolam (XANAX) 0.25 MG tablet Take 0.25 tablets by mouth daily as needed for anxiety.    amitriptyline (ELAVIL) 100 MG tablet Take 100 mg by mouth at bedtime.    atenolol (TENORMIN) 50 MG tablet Take 50 mg by mouth daily.   atorvastatin (LIPITOR) 40 MG tablet Take 40 mg by mouth every other day. At night   cholecalciferol (VITAMIN D3) 25 MCG (1000 UT) tablet Take 1,000 Units by mouth daily.   docusate sodium (COLACE) 100 MG capsule You can buy this at any drug store, over the counter and follow package instructions (Patient taking differently: Take 100 mg by mouth daily.)   EPINEPHrine 0.3 mg/0.3 mL IJ SOAJ injection Inject 0.3 mLs into the muscle once as needed for anaphylaxis.    ezetimibe (ZETIA) 10 MG tablet Take 10 mg by mouth at bedtime.    hydrochlorothiazide (HYDRODIURIL) 25 MG tablet Take 25 mg by mouth daily.   losartan (COZAAR) 100 MG tablet Take 100 mg by mouth daily.   omeprazole (PRILOSEC OTC) 20 MG tablet Take 1 tablet (20 mg total) by mouth daily before breakfast.   Probiotic CAPS Take 1 capsule by mouth daily.   Respiratory Therapy Supplies (FLUTTER) DEVI 1 Device by Does not apply route 2 (two) times daily.   traMADol (ULTRAM) 50 MG tablet SMARTSIG:1 Tablet(s) By Mouth Every 12 Hours PRN   Current Facility-Administered Medications for the 10/06/20 encounter (Office Visit) with Tanda Rockers, MD  Medication   diclofenac sodium (VOLTAREN) 1 % transdermal gel 4 g                    Objective:   Physical Exam   10/06/2020         146   05/29/2020         150   11/29/2019         161   08/09/2019        159  04/07/2018        156  01/04/2018      153  12/05/2017        150  08/31/2017        154  07/22/2017        153   08/17/12 157 lb 9.6 oz (71.487 kg)  05/04/11 156 lb (70.761 kg)  04/20/11 156 lb 6.4 oz (70.943 kg)     Vital signs reviewed  10/06/2020  - Note at rest 02 sats  94% on RA    General appearance:    amb elderly wf nad     HEENT: Full dentures    HEENT : pt wearing mask not removed for exam due to covid -19 concerns.    NECK :  without JVD/Nodes/TM/ nl carotid upstrokes bilaterally  LUNGS: no acc muscle use,    contour chest mod kyphosis with a few insp squeaks and exp rhonchi bilaterally   y without cough on insp or exp maneuvers   CV:  RRR  no s3 or murmur or increase in P2, and no edema   ABD:  soft and nontender with nl inspiratory excursion in the supine position. No bruits or organomegaly appreciated, bowel sounds nl  MS:  Nl gait/ ext warm without deformities, calf tenderness, cyanosis or clubbing No obvious joint restrictions   SKIN: warm and dry without lesions    NEURO:  alert, approp, nl sensorium with  no motor or cerebellar deficits apparent.        CXR PA and Lateral:   10/06/2020 :    I personally reviewed images and agree with radiology impression as follows:   Mild chronic interstitial changes again noted. Mild bibasilar subsegmental atelectasis. Mild biapical pleuroparenchymal thickening consistent scarring. No acute cardiopulmonary disease identified.        Assessment & Plan:

## 2020-10-06 NOTE — Patient Instructions (Signed)
For nasty mucus > doxy 100 mg twice daily with a glass of water x  7days  For cough congestion > mucinex dm 1200 mg twice daily and flutter valve as much as possible  Please remember to go to the  x-ray department  for your tests - we will call you with the results when they are available     Please schedule a follow up visit in 3 months but call sooner if needed   PFTs on return

## 2020-10-07 ENCOUNTER — Encounter: Payer: Self-pay | Admitting: Internal Medicine

## 2020-10-07 DIAGNOSIS — I10 Essential (primary) hypertension: Secondary | ICD-10-CM | POA: Diagnosis not present

## 2020-10-07 DIAGNOSIS — J479 Bronchiectasis, uncomplicated: Secondary | ICD-10-CM | POA: Diagnosis not present

## 2020-10-07 DIAGNOSIS — J09X2 Influenza due to identified novel influenza A virus with other respiratory manifestations: Secondary | ICD-10-CM | POA: Diagnosis not present

## 2020-10-07 DIAGNOSIS — J449 Chronic obstructive pulmonary disease, unspecified: Secondary | ICD-10-CM | POA: Diagnosis not present

## 2020-10-07 NOTE — Assessment & Plan Note (Signed)
Cough since 1980   01/28/12 CT chest:  Mild bronchiectatic change in the right middle lobe and  lingula. In view of these changes, MAC would be a consideration - HRCT 10/13/2017  1. Scattered mild cylindrical and varicoid bronchiectasis, tree-in-bud opacities and mucoid impaction in both lungs, predominantly in the upper lobes and right middle lobe, mildly progressed since 10/06/2016 chest CT, particularly in anterior left upper lobe. 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 2. Moderate centrilobular emphysema with diffuse bronchial wall thickening, suggesting COPD. - Sinus CT 01/02/18 > Clear paranasal sinuses - 01/04/2018 added prn augmentin for flares  - 04/07/2018 added flutter, stopped macrodantin  - flare since late feb 2021 refractory to abx and pred  > 08/09/2019 acute eval rec cyclical cough rx > resolved  -  10/06/2020  doxycline x 7 day cycles added to mucinex dm/ flutter  S/p acute flare responded apparently to doxy and no reason she can't use this prn flares at home as long as understands purpose  F/u with pft in 3 m to see if needs a long acting bronchodilator and in meantime just use saba prn  I spent extra time with pt today reviewing appropriate use of albuterol for prn use on exertion with the following points: 1) saba is for relief of sob that does not improve by walking a slower pace or resting but rather if the pt does not improve after trying this first. 2) If the pt is convinced, as many are, that saba helps recover from activity faster then it's easy to tell if this is the case by re-challenging : ie stop, take the inhaler, then p 5 minutes try the exact same activity (intensity of workload) that just caused the symptoms and see if they are substantially diminished or not after saba 3) if there is an activity that reproducibly causes the symptoms, try the saba 15 min before the activity on alternate days   If in fact  the saba really does help, then fine to continue to use it prn but advised may need to look closer at the maintenance regimen being used to achieve better control of airways disease with exertion.           Each maintenance medication was reviewed in detail including emphasizing most importantly the difference between maintenance and prns and under what circumstances the prns are to be triggered using an action plan format where appropriate.  Total time for H and P, chart review, counseling, reviewing hfa device(s) and generating customized AVS unique to this office visit / same day charting = 43mn

## 2020-10-21 ENCOUNTER — Other Ambulatory Visit: Payer: Self-pay | Admitting: Internal Medicine

## 2020-10-28 DIAGNOSIS — H353132 Nonexudative age-related macular degeneration, bilateral, intermediate dry stage: Secondary | ICD-10-CM | POA: Diagnosis not present

## 2020-11-13 DIAGNOSIS — H4312 Vitreous hemorrhage, left eye: Secondary | ICD-10-CM | POA: Diagnosis not present

## 2020-11-13 DIAGNOSIS — Z961 Presence of intraocular lens: Secondary | ICD-10-CM | POA: Diagnosis not present

## 2020-11-13 DIAGNOSIS — H04123 Dry eye syndrome of bilateral lacrimal glands: Secondary | ICD-10-CM | POA: Diagnosis not present

## 2020-11-13 DIAGNOSIS — H353231 Exudative age-related macular degeneration, bilateral, with active choroidal neovascularization: Secondary | ICD-10-CM | POA: Diagnosis not present

## 2020-11-25 DIAGNOSIS — B029 Zoster without complications: Secondary | ICD-10-CM | POA: Diagnosis not present

## 2020-12-18 DIAGNOSIS — H04123 Dry eye syndrome of bilateral lacrimal glands: Secondary | ICD-10-CM | POA: Diagnosis not present

## 2020-12-18 DIAGNOSIS — H353211 Exudative age-related macular degeneration, right eye, with active choroidal neovascularization: Secondary | ICD-10-CM | POA: Diagnosis not present

## 2020-12-18 DIAGNOSIS — Z961 Presence of intraocular lens: Secondary | ICD-10-CM | POA: Diagnosis not present

## 2020-12-19 DIAGNOSIS — I10 Essential (primary) hypertension: Secondary | ICD-10-CM | POA: Diagnosis not present

## 2020-12-19 DIAGNOSIS — R627 Adult failure to thrive: Secondary | ICD-10-CM | POA: Diagnosis not present

## 2020-12-19 DIAGNOSIS — M315 Giant cell arteritis with polymyalgia rheumatica: Secondary | ICD-10-CM | POA: Diagnosis not present

## 2020-12-19 DIAGNOSIS — J449 Chronic obstructive pulmonary disease, unspecified: Secondary | ICD-10-CM | POA: Diagnosis not present

## 2021-01-06 ENCOUNTER — Other Ambulatory Visit: Payer: Self-pay

## 2021-01-06 ENCOUNTER — Ambulatory Visit (INDEPENDENT_AMBULATORY_CARE_PROVIDER_SITE_OTHER): Payer: Medicare Other | Admitting: Internal Medicine

## 2021-01-06 ENCOUNTER — Ambulatory Visit: Payer: Medicare Other | Admitting: Internal Medicine

## 2021-01-06 ENCOUNTER — Ambulatory Visit (INDEPENDENT_AMBULATORY_CARE_PROVIDER_SITE_OTHER): Payer: Medicare Other | Admitting: Adult Health

## 2021-01-06 ENCOUNTER — Encounter: Payer: Self-pay | Admitting: Adult Health

## 2021-01-06 DIAGNOSIS — J440 Chronic obstructive pulmonary disease with acute lower respiratory infection: Secondary | ICD-10-CM

## 2021-01-06 DIAGNOSIS — Z87891 Personal history of nicotine dependence: Secondary | ICD-10-CM

## 2021-01-06 DIAGNOSIS — J479 Bronchiectasis, uncomplicated: Secondary | ICD-10-CM

## 2021-01-06 DIAGNOSIS — J209 Acute bronchitis, unspecified: Secondary | ICD-10-CM

## 2021-01-06 DIAGNOSIS — J449 Chronic obstructive pulmonary disease, unspecified: Secondary | ICD-10-CM

## 2021-01-06 LAB — PULMONARY FUNCTION TEST
DL/VA % pred: 68 %
DL/VA: 2.71 ml/min/mmHg/L
DLCO cor % pred: 56 %
DLCO cor: 11.89 ml/min/mmHg
DLCO unc % pred: 56 %
DLCO unc: 11.89 ml/min/mmHg
FEF 25-75 Post: 0.86 L/sec
FEF 25-75 Pre: 0.63 L/sec
FEF2575-%Change-Post: 35 %
FEF2575-%Pred-Post: 56 %
FEF2575-%Pred-Pre: 41 %
FEV1-%Change-Post: 10 %
FEV1-%Pred-Post: 72 %
FEV1-%Pred-Pre: 65 %
FEV1-Post: 1.61 L
FEV1-Pre: 1.45 L
FEV1FVC-%Change-Post: 0 %
FEV1FVC-%Pred-Pre: 77 %
FEV6-%Change-Post: 10 %
FEV6-%Pred-Post: 97 %
FEV6-%Pred-Pre: 88 %
FEV6-Post: 2.74 L
FEV6-Pre: 2.47 L
FEV6FVC-%Change-Post: 0 %
FEV6FVC-%Pred-Post: 101 %
FEV6FVC-%Pred-Pre: 102 %
FVC-%Change-Post: 11 %
FVC-%Pred-Post: 95 %
FVC-%Pred-Pre: 85 %
FVC-Post: 2.84 L
FVC-Pre: 2.54 L
Post FEV1/FVC ratio: 57 %
Post FEV6/FVC ratio: 97 %
Pre FEV1/FVC ratio: 57 %
Pre FEV6/FVC Ratio: 97 %
RV % pred: 138 %
RV: 3.6 L
TLC % pred: 113 %
TLC: 6.3 L

## 2021-01-06 MED ORDER — PREDNISONE 10 MG PO TABS: ORAL_TABLET | ORAL | 0 refills | Status: DC

## 2021-01-06 MED ORDER — DOXYCYCLINE HYCLATE 100 MG PO TABS: 100.0000 mg | ORAL_TABLET | Freq: Two times a day (BID) | ORAL | 0 refills | Status: DC

## 2021-01-06 MED ORDER — STIOLTO RESPIMAT 2.5-2.5 MCG/ACT IN AERS: 2.0000 | INHALATION_SPRAY | Freq: Every day | RESPIRATORY_TRACT | 5 refills | Status: DC

## 2021-01-06 MED ORDER — ALBUTEROL SULFATE HFA 108 (90 BASE) MCG/ACT IN AERS: 2.0000 | INHALATION_SPRAY | RESPIRATORY_TRACT | 2 refills | Status: DC | PRN

## 2021-01-06 NOTE — Progress Notes (Signed)
PFT done today. 

## 2021-01-06 NOTE — Progress Notes (Signed)
_0  ID: Heidi Hobbs, female    DOB: Nov 24, 1940, 80 y.o.   MRN: [id number]  Chief Complaint  Patient presents with   Follow-up    Referring provider: Shon Baton, MD  HPI: 80 year old female former smoker followed for COPD and bronchiectasis Medical history significant for history of PMR previously on steroids, previous exposure to chlorine gas in the 1980s, previously on Macrodantin for UTIs stopped in 2019  TEST/EVENTS :  12/20/2018-Rh factor-negative 12/20/2018-ANA-negative 12/20/2018-sed rate-22 1014/2020-CBC with differential-eosinophils absolute 0.1, eosinophils relative 1 11/27/2018-chest x-ray-emphysema, no acute disease   08/31/2017-pulmonary function test-FVC 2.95 (95% predicted), postbronchodilator ratio 68, postbronchodilator FEV1 2.23 (95% predicted) no bronchodilator response, mid flow reversibility, DLCO 16.75 (57% predicted)   10/14/2017-CT chest high-res-moderate centrilobular emphysema and diffuse bronchial wall thickening, no acute consolidative airspace disease or lung masses, scattered mild cylindrical varicoid bronchiectasis throughout both lungs predominantly in bilateral upper lobes and right middle lobe with the associated mild patchy tree-in-bud opacities and scattered mucoid impaction, 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, moderate centrilobular emphysema, patchy lobular air trapping in both lungs indicative of small airways disease,   01/03/2018-CT maxillofacial-clear paranasal sinuses  01/06/2021 Follow up ; COPD, Bronchiectasis  Patient presents for a follow-up visit and to review pulmonary function testing.  Unfortunately patient says that she has been sick over the last 1 to 2 weeks.  She has had increased cough congestion with thick yellow mucus, shortness of breath and wheezing.  She did start on doxycycline 3 days ago.  She denies any hemoptysis, chest pain, orthopnea, nausea or vomiting.   Patient has had low-grade fevers. Patient did have pulmonary function testing today that showed a significant drop in lung function with an FEV1 at 72%, ratio 57, FVC 95%, positive bronchodilator response at 10%.,  DLCO 56%. Patient has been using her albuterol inhaler.  Has not been using her flutter valve is much as she should.  Allergies  Allergen Reactions   Eggs Or Egg-Derived Products     Skin nodules    Immunization History  Administered Date(s) Administered   Influenza Split 02/27/2010, 03/15/2010, 12/28/2011, 05/29/2013, 12/04/2013   Influenza Whole 12/14/2011, 12/13/2016   Influenza, High Dose Seasonal PF 11/21/2017   Influenza, Quadrivalent, Recombinant, Inj, Pf 11/18/2017   Influenza,inj,Quad PF,6+ Mos 01/02/2015   PFIZER(Purple Top)SARS-COV-2 Vaccination 04/23/2019, 05/21/2019   Pneumococcal Conjugate-13 06/27/2014   Pneumococcal Polysaccharide-23 09/03/2002, 05/29/2013   Tdap 03/28/2009, 10/16/2011    Past Medical History:  Diagnosis Date   Adenomatous colon polyp 1995, 2011   Anemia    Anxiety    Bronchiectasis ([place])    Cancer ([place])    uterine cancer   Carotid stenosis    40-59% right, less than 40% left   Cataracts, bilateral    COPD (chronic obstructive pulmonary disease) ([place])    Dyspnea    Essential hypertension 02/13/2016   Hyperlipidemia    Hypertension    MAC (mycobacterium avium-intracellulare complex)    Macular degeneration, dry 02/13/2016   Polio 1959   Stomach ulcer 1960   Urinary tract infection    Vasculitis ([place])    Normocomplementemic urticarial    Vasculitis ([place]) 02/13/2016   Followed by [facility]     Tobacco History: Social History   Tobacco Use  Smoking Status Former   Packs/day: 0.50   Years: 50.00   Pack years: 25.00   Types: Cigarettes   Quit date: 08/01/2017   Years since quitting: 3.4  Smokeless Tobacco Never  Counseling given: Not Answered   Outpatient Medications Prior to Visit  Medication Sig  Dispense Refill   albuterol (PROVENTIL) (2.5 MG/3ML) 0.083% nebulizer solution Take 3 mLs (2.5 mg total) by nebulization every 6 (six) hours as needed for wheezing or shortness of breath. 75 mL 12   ALPRAZolam (XANAX) 0.25 MG tablet Take 0.25 tablets by mouth daily as needed for anxiety.   1   amitriptyline (ELAVIL) 100 MG tablet Take 100 mg by mouth at bedtime.      atenolol (TENORMIN) 50 MG tablet Take 50 mg by mouth daily.     atorvastatin (LIPITOR) 40 MG tablet Take 40 mg by mouth every other day. At night     cholecalciferol (VITAMIN D3) 25 MCG (1000 UT) tablet Take 1,000 Units by mouth daily.     docusate sodium (COLACE) 100 MG capsule You can buy this at any drug store, over the counter and follow package instructions (Patient taking differently: Take 100 mg by mouth daily.) 10 capsule 0   doxycycline (VIBRA-TABS) 100 MG tablet Take 1 tablet (100 mg total) by mouth 2 (two) times daily. 14 tablet 11   EPINEPHrine 0.3 mg/0.3 mL IJ SOAJ injection Inject 0.3 mLs into the muscle once as needed for anaphylaxis.      ezetimibe (ZETIA) 10 MG tablet Take 10 mg by mouth at bedtime.      hydrochlorothiazide (HYDRODIURIL) 25 MG tablet Take 25 mg by mouth daily.     losartan (COZAAR) 100 MG tablet Take 100 mg by mouth daily.     omeprazole (PRILOSEC OTC) 20 MG tablet Take 1 tablet (20 mg total) by mouth daily before breakfast. 30 tablet 3   Probiotic CAPS Take 1 capsule by mouth daily.     Respiratory Therapy Supplies (FLUTTER) DEVI 1 Device by Does not apply route 2 (two) times daily. 1 each 0   traMADol (ULTRAM) 50 MG tablet SMARTSIG:1 Tablet(s) By Mouth Every 12 Hours PRN     albuterol (VENTOLIN HFA) 108 (90 Base) MCG/ACT inhaler Inhale 2 puffs into the lungs every 4 (four) hours as needed for wheezing or shortness of breath. 8.5 g 2   Facility-Administered Medications Prior to Visit  Medication Dose Route Frequency Provider Last Rate Last Admin   diclofenac sodium (VOLTAREN) 1 % transdermal gel 4  g  4 g Topical QID [name], MD         Review of Systems:   Constitutional:   No  weight loss, night sweats,  Fevers, chills,  +fatigue, or  lassitude.  HEENT:   No headaches,  Difficulty swallowing,  Tooth/dental problems, or  Sore throat,                No sneezing, itching, ear ache, nasal congestion, post nasal drip,   CV:  No chest pain,  Orthopnea, PND, swelling in lower extremities, anasarca, dizziness, palpitations, syncope.   GI  No heartburn, indigestion, abdominal pain, nausea, vomiting, diarrhea, change in bowel habits, loss of appetite, bloody stools.   Resp: .  No chest wall deformity  Skin: no rash or lesions.  GU: no dysuria, change in color of urine, no urgency or frequency.  No flank pain, no hematuria   MS:  No joint pain or swelling.  No decreased range of motion.  No back pain.    Physical Exam  BP (!) 154/64 (BP Location: Left Arm, Patient Position: Sitting, Cuff Size: Normal)   Pulse 78   Temp 98.4 F (36.9 C) (Oral)  Ht _0  (1.702 m)   Wt 143 lb 12.8 oz (65.2 kg)   SpO2 93%   BMI 22.52 kg/m   GEN: A/Ox3; pleasant , NAD, thin female   HEENT:  [place]/AT,  EACs-clear, TMs-wnl, NOSE-clear, THROAT-clear, no lesions, no postnasal drip or exudate noted.   NECK:  Supple w/ fair ROM; no JVD; normal carotid impulses w/o bruits; no thyromegaly or nodules palpated; no lymphadenopathy.    RESP scattered rhonchi tr accessory muscle use, no dullness to percussion  CARD:  RRR, no m/r/g, no peripheral edema, pulses intact, no cyanosis or clubbing.  GI:   Soft & nt; nml bowel sounds; no organomegaly or masses detected.   Musco: Warm bil, no deformities or joint swelling noted.   Neuro: alert, no focal deficits noted.    Skin: Warm, no lesions or rashes    Lab Results:  CBC    BNP   ProBNP No results found for: PROBNP  Imaging: No results found.    PFT Results Latest Ref Rng & Units 01/06/2021 08/31/2017  FVC-Pre L 2.54 2.95   FVC-Predicted Pre % 85 95  FVC-Post L 2.84 3.27  FVC-Predicted Post % 95 105  Pre FEV1/FVC % % 57 71  Post FEV1/FCV % % 57 68  FEV1-Pre L 1.45 2.09  FEV1-Predicted Pre % 65 90  FEV1-Post L 1.61 2.23  DLCO uncorrected ml/min/mmHg 11.89 16.75  DLCO UNC% % 56 57  DLCO corrected ml/min/mmHg 11.89 -  DLCO COR %Predicted % 56 -  DLVA Predicted % 68 66  TLC L 6.30 6.53  TLC % Predicted % 113 117  RV % Predicted % 138 136    No results found for: NITRICOXIDE      Assessment & Plan:   Bronchiectasis without acute exacerbation (HCC) Acute exacerbation of bronchiectasis.  Patient is complete doxycycline.  Increase her mucociliary clearance with Mucinex, flutter valve. Add Stiolto daily.  Plan  Patient Instructions  Finish Doxycycline   Prednisone taper over next week.  Mucinex DM Twice daily  As needed  cough/congestion  Begin Stiolto 2 puffs daily  Flutter valve Three times a day   Albuterol neb or inhaler .As needed   Fluids and rest  Follow up with Dr. Melvyn Novas  in 6 weeks and As needed   Please contact office for sooner follow up if symptoms do not improve or worsen or seek emergency care            COPD GOLD 1 PFTs show a significant decline in lung function however patient is sick today with a COPD and bronchiectatic exacerbation. Add Stiolto  abx /steroid   Plan Patient Instructions  Finish Doxycycline   Prednisone taper over next week.  Mucinex DM Twice daily  As needed  cough/congestion  Begin Stiolto 2 puffs daily  Flutter valve Three times a day   Albuterol neb or inhaler .As needed   Fluids and rest  Follow up with Dr. Melvyn Novas  in 6 weeks and As needed   Please contact office for sooner follow up if symptoms do not improve or worsen or seek emergency care              Rexene Edison, NP 01/06/2021

## 2021-01-06 NOTE — Patient Instructions (Addendum)
Finish Doxycycline   Prednisone taper over next week.  Mucinex DM Twice daily  As needed  cough/congestion  Begin Stiolto 2 puffs daily  Flutter valve Three times a day   Albuterol neb or inhaler .As needed   Fluids and rest  Follow up with Dr. Melvyn Novas  in 6 weeks and As needed   Please contact office for sooner follow up if symptoms do not improve or worsen or seek emergency care

## 2021-01-06 NOTE — Assessment & Plan Note (Signed)
Acute exacerbation of bronchiectasis.  Patient is complete doxycycline.  Increase her mucociliary clearance with Mucinex, flutter valve. Add Stiolto daily.  Plan  Patient Instructions  Finish Doxycycline   Prednisone taper over next week.  Mucinex DM Twice daily  As needed  cough/congestion  Begin Stiolto 2 puffs daily  Flutter valve Three times a day   Albuterol neb or inhaler .As needed   Fluids and rest  Follow up with Dr. Melvyn Novas  in 6 weeks and As needed   Please contact office for sooner follow up if symptoms do not improve or worsen or seek emergency care

## 2021-01-06 NOTE — Assessment & Plan Note (Signed)
PFTs show a significant decline in lung function however patient is sick today with a COPD and bronchiectatic exacerbation. Add Stiolto  abx /steroid   Plan Patient Instructions  Finish Doxycycline   Prednisone taper over next week.  Mucinex DM Twice daily  As needed  cough/congestion  Begin Stiolto 2 puffs daily  Flutter valve Three times a day   Albuterol neb or inhaler .As needed   Fluids and rest  Follow up with Dr. Melvyn Novas  in 6 weeks and As needed   Please contact office for sooner follow up if symptoms do not improve or worsen or seek emergency care

## 2021-01-06 NOTE — Progress Notes (Signed)
Patient seen in the office today and instructed on use of Stiolto.  Patient expressed understanding and demonstrated technique. 

## 2021-01-15 DIAGNOSIS — H04123 Dry eye syndrome of bilateral lacrimal glands: Secondary | ICD-10-CM | POA: Diagnosis not present

## 2021-01-15 DIAGNOSIS — H353230 Exudative age-related macular degeneration, bilateral, stage unspecified: Secondary | ICD-10-CM | POA: Diagnosis not present

## 2021-01-15 DIAGNOSIS — Z961 Presence of intraocular lens: Secondary | ICD-10-CM | POA: Diagnosis not present

## 2021-01-15 IMAGING — DX DG CHEST 2V
2 series · 2 of 2 positions shown · non-contrast
Comparison: November 27, 2018.

CLINICAL DATA: Cough.

EXAM:
CHEST - 2 VIEW

[chest pa]
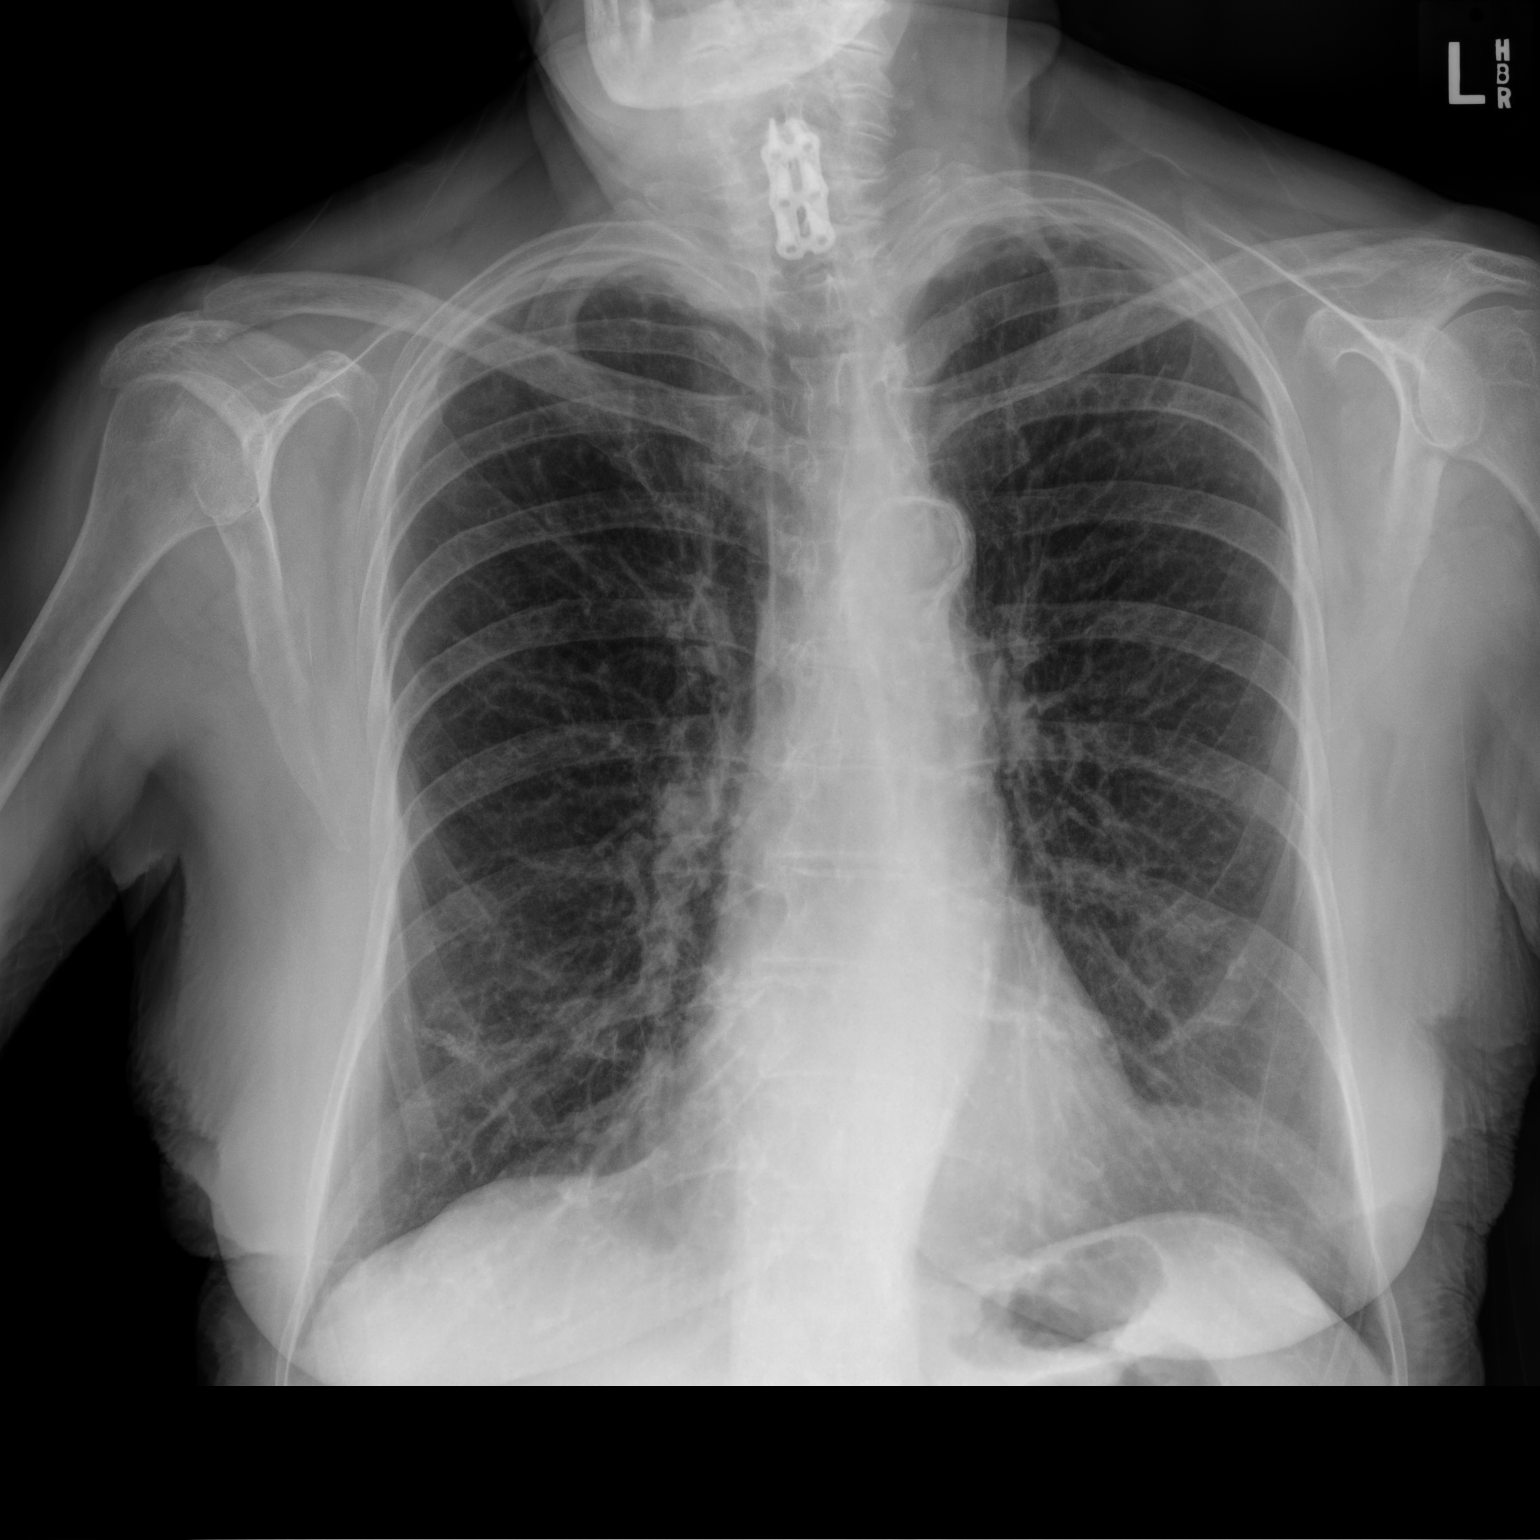

[chest lat]
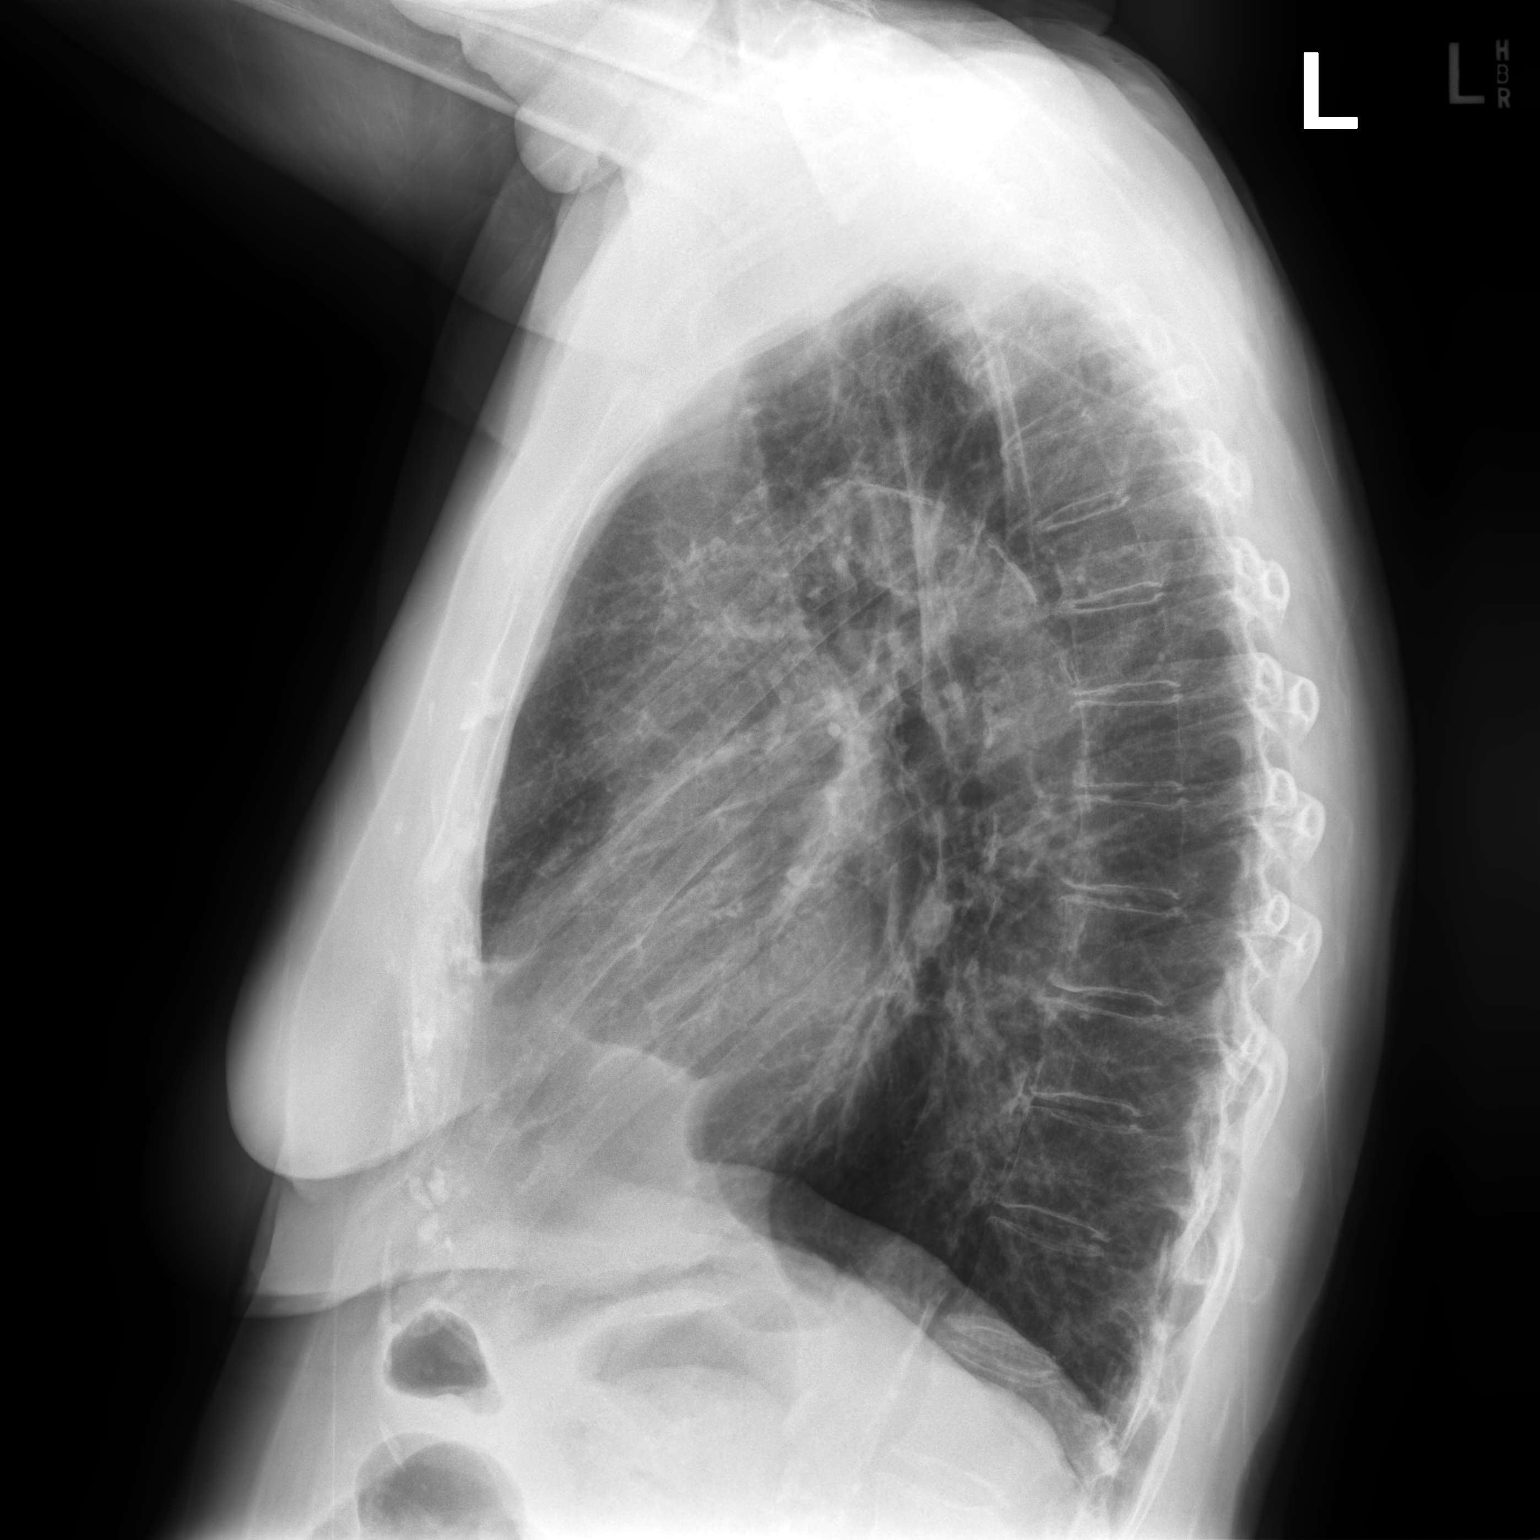

[2 of 2 positions shown; findings below may reference images not displayed]

FINDINGS: The heart size and mediastinal contours are within normal limits.
Both lungs are clear. Atherosclerosis of thoracic aorta is noted. No
pneumothorax or pleural effusion is noted. The visualized skeletal
structures are unremarkable.
IMPRESSION: No active cardiopulmonary disease.

Aortic Atherosclerosis ([id number]-QR5.5).

## 2021-02-08 ENCOUNTER — Emergency Department (HOSPITAL_COMMUNITY): Payer: Medicare Other

## 2021-02-08 ENCOUNTER — Other Ambulatory Visit: Payer: Self-pay

## 2021-02-08 ENCOUNTER — Observation Stay (HOSPITAL_COMMUNITY)
Admission: EM | Admit: 2021-02-08 | Discharge: 2021-02-10 | Disposition: A | Discharge status: Home / Self Care | Payer: Medicare Other | Attending: Internal Medicine | Admitting: Internal Medicine

## 2021-02-08 ENCOUNTER — Encounter (HOSPITAL_COMMUNITY): Payer: Self-pay | Admitting: Emergency Medicine

## 2021-02-08 DIAGNOSIS — E876 Hypokalemia: Secondary | ICD-10-CM | POA: Insufficient documentation

## 2021-02-08 DIAGNOSIS — Z8541 Personal history of malignant neoplasm of cervix uteri: Secondary | ICD-10-CM | POA: Diagnosis not present

## 2021-02-08 DIAGNOSIS — D649 Anemia, unspecified: Secondary | ICD-10-CM

## 2021-02-08 DIAGNOSIS — R42 Dizziness and giddiness: Secondary | ICD-10-CM | POA: Diagnosis not present

## 2021-02-08 DIAGNOSIS — Z79899 Other long term (current) drug therapy: Secondary | ICD-10-CM | POA: Insufficient documentation

## 2021-02-08 DIAGNOSIS — I10 Essential (primary) hypertension: Secondary | ICD-10-CM | POA: Diagnosis not present

## 2021-02-08 DIAGNOSIS — K625 Hemorrhage of anus and rectum: Secondary | ICD-10-CM | POA: Diagnosis present

## 2021-02-08 DIAGNOSIS — I959 Hypotension, unspecified: Secondary | ICD-10-CM | POA: Diagnosis not present

## 2021-02-08 DIAGNOSIS — K922 Gastrointestinal hemorrhage, unspecified: Secondary | ICD-10-CM

## 2021-02-08 DIAGNOSIS — K920 Hematemesis: Secondary | ICD-10-CM | POA: Diagnosis not present

## 2021-02-08 DIAGNOSIS — Z87891 Personal history of nicotine dependence: Secondary | ICD-10-CM | POA: Diagnosis not present

## 2021-02-08 DIAGNOSIS — R262 Difficulty in walking, not elsewhere classified: Secondary | ICD-10-CM | POA: Diagnosis not present

## 2021-02-08 DIAGNOSIS — R0902 Hypoxemia: Secondary | ICD-10-CM | POA: Diagnosis not present

## 2021-02-08 DIAGNOSIS — U071 COVID-19: Secondary | ICD-10-CM | POA: Diagnosis not present

## 2021-02-08 DIAGNOSIS — R531 Weakness: Secondary | ICD-10-CM | POA: Diagnosis not present

## 2021-02-08 DIAGNOSIS — R059 Cough, unspecified: Secondary | ICD-10-CM | POA: Diagnosis not present

## 2021-02-08 DIAGNOSIS — J449 Chronic obstructive pulmonary disease, unspecified: Secondary | ICD-10-CM | POA: Diagnosis not present

## 2021-02-08 LAB — ABO/RH: ABO/RH(D): O POS

## 2021-02-08 LAB — CBC
HCT: 30.7 % — ABNORMAL LOW (ref 36.0–46.0)
Hemoglobin: 10.3 g/dL — ABNORMAL LOW (ref 12.0–15.0)
MCH: 30.3 pg (ref 26.0–34.0)
MCHC: 33.6 g/dL (ref 30.0–36.0)
MCV: 90.3 fL (ref 80.0–100.0)
Platelets: 245 10*3/uL (ref 150–400)
RBC: 3.4 MIL/uL — ABNORMAL LOW (ref 3.87–5.11)
RDW: 14.7 % (ref 11.5–15.5)
WBC: 12.9 10*3/uL — ABNORMAL HIGH (ref 4.0–10.5)
nRBC: 0 % (ref 0.0–0.2)

## 2021-02-08 LAB — FIBRINOGEN: Fibrinogen: 349 mg/dL (ref 210–475)

## 2021-02-08 LAB — PROTIME-INR
INR: 1.1 (ref 0.8–1.2)
Prothrombin Time: 14.4 seconds (ref 11.4–15.2)

## 2021-02-08 LAB — COMPREHENSIVE METABOLIC PANEL
ALT: 9 U/L (ref 0–44)
AST: 10 U/L — ABNORMAL LOW (ref 15–41)
Albumin: 2.7 g/dL — ABNORMAL LOW (ref 3.5–5.0)
Alkaline Phosphatase: 56 U/L (ref 38–126)
Anion gap: 6 (ref 5–15)
BUN: 8 mg/dL (ref 8–23)
CO2: 26 mmol/L (ref 22–32)
Calcium: 7.8 mg/dL — ABNORMAL LOW (ref 8.9–10.3)
Chloride: 101 mmol/L (ref 98–111)
Creatinine, Ser: 0.52 mg/dL (ref 0.44–1.00)
GFR, Estimated: >60 mL/min (ref >60)
Glucose, Bld: 134 mg/dL — ABNORMAL HIGH (ref 70–99)
Potassium: 2.9 mmol/L — ABNORMAL LOW (ref 3.5–5.1)
Sodium: 133 mmol/L — ABNORMAL LOW (ref 135–145)
Total Bilirubin: 0.6 mg/dL (ref 0.3–1.2)
Total Protein: 5.3 g/dL — ABNORMAL LOW (ref 6.5–8.1)

## 2021-02-08 LAB — RESP PANEL BY RT-PCR (FLU A&B, COVID) ARPGX2
Influenza A by PCR: NEGATIVE
Influenza B by PCR: NEGATIVE
SARS Coronavirus 2 by RT PCR: POSITIVE — AB

## 2021-02-08 LAB — CBC WITH DIFFERENTIAL/PLATELET
Abs Immature Granulocytes: 0.09 10*3/uL — ABNORMAL HIGH (ref 0.00–0.07)
Basophils Absolute: 0 10*3/uL (ref 0.0–0.1)
Basophils Relative: 0 %
Eosinophils Absolute: 0 10*3/uL (ref 0.0–0.5)
Eosinophils Relative: 0 %
HCT: 31.9 % — ABNORMAL LOW (ref 36.0–46.0)
Hemoglobin: 10.8 g/dL — ABNORMAL LOW (ref 12.0–15.0)
Immature Granulocytes: 1 %
Lymphocytes Relative: 10 %
Lymphs Abs: 1.7 10*3/uL (ref 0.7–4.0)
MCH: 30.6 pg (ref 26.0–34.0)
MCHC: 33.9 g/dL (ref 30.0–36.0)
MCV: 90.4 fL (ref 80.0–100.0)
Monocytes Absolute: 1.4 10*3/uL — ABNORMAL HIGH (ref 0.1–1.0)
Monocytes Relative: 8 %
Neutro Abs: 13.7 10*3/uL — ABNORMAL HIGH (ref 1.7–7.7)
Neutrophils Relative %: 81 %
Platelets: 244 10*3/uL (ref 150–400)
RBC: 3.53 MIL/uL — ABNORMAL LOW (ref 3.87–5.11)
RDW: 14.9 % (ref 11.5–15.5)
WBC: 16.9 10*3/uL — ABNORMAL HIGH (ref 4.0–10.5)
nRBC: 0 % (ref 0.0–0.2)

## 2021-02-08 LAB — POC OCCULT BLOOD, ED: Fecal Occult Bld: POSITIVE — AB

## 2021-02-08 LAB — PROCALCITONIN: Procalcitonin: <0.1 ng/mL

## 2021-02-08 LAB — D-DIMER, QUANTITATIVE: D-Dimer, Quant: 0.56 ug/mL-FEU — ABNORMAL HIGH (ref 0.00–0.50)

## 2021-02-08 LAB — TYPE AND SCREEN
ABO/RH(D): O POS
Antibody Screen: NEGATIVE

## 2021-02-08 MED ORDER — DOCUSATE SODIUM 100 MG PO CAPS
100.0000 mg | ORAL_CAPSULE | Freq: Every day | ORAL | Status: DC
  Administered 2021-02-08 – 2021-02-09 (×2): 100 mg via ORAL
  Filled 2021-02-08 (×2): qty 1

## 2021-02-08 MED ORDER — ATORVASTATIN CALCIUM 40 MG PO TABS
40.0000 mg | ORAL_TABLET | ORAL | Status: DC
  Administered 2021-02-09: 22:00:00 40 mg via ORAL
  Filled 2021-02-08: qty 1

## 2021-02-08 MED ORDER — SODIUM CHLORIDE 0.9 % IV SOLN
500.0000 mg | INTRAVENOUS | Status: DC
  Administered 2021-02-08: 14:00:00 500 mg via INTRAVENOUS
  Filled 2021-02-08: qty 500

## 2021-02-08 MED ORDER — ATENOLOL 50 MG PO TABS
50.0000 mg | ORAL_TABLET | Freq: Every day | ORAL | Status: DC
  Administered 2021-02-08 – 2021-02-10 (×3): 50 mg via ORAL
  Filled 2021-02-08 (×3): qty 1

## 2021-02-08 MED ORDER — LOSARTAN POTASSIUM 50 MG PO TABS
100.0000 mg | ORAL_TABLET | Freq: Every day | ORAL | Status: DC
  Administered 2021-02-08 – 2021-02-10 (×3): 100 mg via ORAL
  Filled 2021-02-08 (×3): qty 2

## 2021-02-08 MED ORDER — POTASSIUM CHLORIDE CRYS ER 20 MEQ PO TBCR
40.0000 meq | EXTENDED_RELEASE_TABLET | Freq: Every day | ORAL | Status: DC
  Administered 2021-02-08: 11:00:00 40 meq via ORAL
  Filled 2021-02-08: qty 2

## 2021-02-08 MED ORDER — SODIUM CHLORIDE 0.9 % IV SOLN
200.0000 mg | Freq: Once | INTRAVENOUS | Status: AC
  Administered 2021-02-08: 16:00:00 200 mg via INTRAVENOUS
  Filled 2021-02-08: qty 40

## 2021-02-08 MED ORDER — ACETAMINOPHEN 325 MG PO TABS: 650.0000 mg | ORAL_TABLET | Freq: Four times a day (QID) | ORAL | Status: DC | PRN

## 2021-02-08 MED ORDER — SODIUM CHLORIDE 0.9 % IV BOLUS
500.0000 mL | Freq: Once | INTRAVENOUS | Status: AC
  Administered 2021-02-08: 04:00:00 500 mL via INTRAVENOUS

## 2021-02-08 MED ORDER — SODIUM CHLORIDE 0.9 % IV SOLN
2.0000 g | INTRAVENOUS | Status: DC
  Administered 2021-02-08: 13:00:00 2 g via INTRAVENOUS
  Filled 2021-02-08: qty 20

## 2021-02-08 MED ORDER — HYDROCHLOROTHIAZIDE 25 MG PO TABS
25.0000 mg | ORAL_TABLET | Freq: Every day | ORAL | Status: DC
  Administered 2021-02-08 – 2021-02-10 (×3): 25 mg via ORAL
  Filled 2021-02-08 (×4): qty 1

## 2021-02-08 MED ORDER — GUAIFENESIN-DM 100-10 MG/5ML PO SYRP
10.0000 mL | ORAL_SOLUTION | ORAL | Status: DC | PRN
  Administered 2021-02-10: 10 mL via ORAL
  Filled 2021-02-08: qty 10

## 2021-02-08 MED ORDER — IPRATROPIUM-ALBUTEROL 20-100 MCG/ACT IN AERS
1.0000 | INHALATION_SPRAY | Freq: Four times a day (QID) | RESPIRATORY_TRACT | Status: DC
  Administered 2021-02-08 – 2021-02-10 (×7): 1 via RESPIRATORY_TRACT
  Filled 2021-02-08: qty 4

## 2021-02-08 MED ORDER — PANTOPRAZOLE SODIUM 40 MG IV SOLR
40.0000 mg | Freq: Two times a day (BID) | INTRAVENOUS | Status: DC
  Administered 2021-02-08 – 2021-02-10 (×5): 40 mg via INTRAVENOUS
  Filled 2021-02-08 (×5): qty 40

## 2021-02-08 MED ORDER — HYDROCOD POLST-CPM POLST ER 10-8 MG/5ML PO SUER
5.0000 mL | Freq: Two times a day (BID) | ORAL | Status: DC | PRN
  Filled 2021-02-08: qty 5

## 2021-02-08 MED ORDER — ZINC SULFATE 220 (50 ZN) MG PO CAPS
220.0000 mg | ORAL_CAPSULE | Freq: Every day | ORAL | Status: DC
  Administered 2021-02-08 – 2021-02-10 (×3): 220 mg via ORAL
  Filled 2021-02-08 (×3): qty 1

## 2021-02-08 MED ORDER — TRAMADOL HCL 50 MG PO TABS: 50.0000 mg | ORAL_TABLET | Freq: Every evening | ORAL | Status: DC | PRN

## 2021-02-08 MED ORDER — ONDANSETRON HCL 4 MG/2ML IJ SOLN: 4.0000 mg | Freq: Four times a day (QID) | INTRAMUSCULAR | Status: DC | PRN

## 2021-02-08 MED ORDER — ASCORBIC ACID 500 MG PO TABS
500.0000 mg | ORAL_TABLET | Freq: Every day | ORAL | Status: DC
  Administered 2021-02-08 – 2021-02-10 (×3): 500 mg via ORAL
  Filled 2021-02-08 (×3): qty 1

## 2021-02-08 MED ORDER — AMITRIPTYLINE HCL 25 MG PO TABS
100.0000 mg | ORAL_TABLET | Freq: Every day | ORAL | Status: DC
  Administered 2021-02-08 – 2021-02-09 (×2): 100 mg via ORAL
  Filled 2021-02-08 (×2): qty 4

## 2021-02-08 MED ORDER — POTASSIUM CHLORIDE CRYS ER 20 MEQ PO TBCR
40.0000 meq | EXTENDED_RELEASE_TABLET | Freq: Two times a day (BID) | ORAL | Status: AC
  Administered 2021-02-08 – 2021-02-09 (×3): 40 meq via ORAL
  Filled 2021-02-08 (×3): qty 2

## 2021-02-08 MED ORDER — EZETIMIBE 10 MG PO TABS
10.0000 mg | ORAL_TABLET | Freq: Every day | ORAL | Status: DC
  Administered 2021-02-08 – 2021-02-09 (×2): 10 mg via ORAL
  Filled 2021-02-08 (×2): qty 1

## 2021-02-08 MED ORDER — ONDANSETRON HCL 4 MG PO TABS: 4.0000 mg | ORAL_TABLET | Freq: Four times a day (QID) | ORAL | Status: DC | PRN

## 2021-02-08 MED ORDER — POTASSIUM CHLORIDE 10 MEQ/100ML IV SOLN
10.0000 meq | INTRAVENOUS | Status: DC
  Administered 2021-02-08 (×2): 10 meq via INTRAVENOUS
  Filled 2021-02-08 (×4): qty 100

## 2021-02-08 MED ORDER — SODIUM CHLORIDE 0.9 % IV SOLN
100.0000 mg | Freq: Every day | INTRAVENOUS | Status: DC
  Administered 2021-02-09 – 2021-02-10 (×2): 100 mg via INTRAVENOUS
  Filled 2021-02-08 (×2): qty 20

## 2021-02-08 MED ORDER — DEXAMETHASONE SODIUM PHOSPHATE 10 MG/ML IJ SOLN
6.0000 mg | Freq: Every day | INTRAMUSCULAR | Status: DC
  Administered 2021-02-08 – 2021-02-10 (×3): 6 mg via INTRAVENOUS
  Filled 2021-02-08 (×3): qty 1

## 2021-02-08 MED ORDER — HYDRALAZINE HCL 25 MG PO TABS
25.0000 mg | ORAL_TABLET | Freq: Two times a day (BID) | ORAL | Status: DC
  Administered 2021-02-08 – 2021-02-10 (×5): 25 mg via ORAL
  Filled 2021-02-08 (×5): qty 1

## 2021-02-08 NOTE — ED Triage Notes (Signed)
Pt here BIB [facility] after 1 episode of bright red rectal bleeding after taking laxative today for constipation, pt reports she is COVID+, had 500 NS en route

## 2021-02-08 NOTE — ED Provider Notes (Signed)
[place] DEPT Provider Note   CSN: [id number] Arrival date & time: 02/08/21  0254     History Chief Complaint  Patient presents with   Rectal Bleeding    Heidi Hobbs is a 80 y.o. female.  Patient is an 80 year old female with past medical history of COPD, hypertension, anemia, and recently diagnosed COVID-19.  Patient presenting today for evaluation of rectal bleeding.  According to the patient, she had a bowel movement this evening that was maroon and bloody in color.  Her daughter noticed this and called 911 to have her evaluated.  Patient denies to me that she is having any pain in her abdomen or rectum.  She does report having some constipation recently, but denies having strained with stools.  She has had hemorrhoids in the past.  She denies taking any blood thinners.  The history is provided by the patient.  Rectal Bleeding Quality:  Maroon Amount:  Moderate Duration:  1 hour Chronicity:  New Relieved by:  Nothing Worsened by:  Nothing Ineffective treatments:  None tried Associated symptoms: no fever   Risk factors: no anticoagulant use       Past Medical History:  Diagnosis Date   Adenomatous colon polyp 1995, 2011   Anemia    Anxiety    Bronchiectasis ([place])    Cancer ([place])    uterine cancer   Carotid stenosis    40-59% right, less than 40% left   Cataracts, bilateral    COPD (chronic obstructive pulmonary disease) ([place])    Dyspnea    Essential hypertension 02/13/2016   Hyperlipidemia    Hypertension    MAC (mycobacterium avium-intracellulare complex)    Macular degeneration, dry 02/13/2016   Polio 1959   Stomach ulcer 1960   Urinary tract infection    Vasculitis ([place])    Normocomplementemic urticarial    Vasculitis ([place]) 02/13/2016   Follow[facility] by [facility]     Patient Active Problem List   Diagnosis Date Noted   Acute respiratory failure ([place]) 08/28/2020   Hyponatremia 08/28/2020   Bilateral  exudative age-related macular degeneration ([place]) 04/03/2020   Vitreous hemorrhage of left eye ([place]) 04/03/2020   Vision changes 12/31/2019   Candidiasis, mouth 08/10/2019   Polymyalgia rheumatica ([place]) 06/08/2019   Gastroesophageal reflux disease 06/08/2019   Impingement syndrome of right shoulder 03/23/2019   Voiding dysfunction 03/07/2019   S/P cervical spinal fusion 12/05/2018   Spinal stenosis of cervical region 11/22/2018   Pseudophakia, both eyes 11/15/2018   Dysuria 04/08/2018   Former smoker 07/24/2017   Venous stasis ulcer ([place]) 05/25/2017   Atherosclerosis of native arteries of extremity with intermittent claudication ([place]) 05/25/2017   Occlusion and stenosis of vertebral artery 05/14/2016   Aortic arch atherosclerosis ([place]) 05/14/2016   Essential hypertension 02/13/2016   Vasculitis ([place]) 02/13/2016   Macular degeneration, dry 02/13/2016   Acute appendicitis with perforation and peritoneal abscess 01/23/2016   Carotid artery disease ([place]) 07/31/2015   Chronic urticaria 12/11/2012   Bronchiectasis without acute exacerbation ([place]) 08/18/2012   Cough 08/18/2012   COPD GOLD 1 08/17/2012   Dyslipidemia 09/14/2010    Past Surgical History:  Procedure Laterality Date   ANTERIOR CERVICAL DECOMP/DISCECTOMY FUSION N/A 11/27/2018   Procedure: CERVICAL FIVE TO SIX , CERVICAL SIX TO SEVEN, ANTERIOR CERVICAL DECOMPRESSION/DISCECTOMY FUSION, ALLOGRAFT, PLATE;  Surgeon: [name], MD;  Location: [place];  Service: Orthopedics;  Laterality: N/A;   APPENDECTOMY     rupture appendix   ARTERY BIOPSY N/A  08/17/2019   Procedure: BIOPSY TEMPORAL ARTERY;  Surgeon: Leta Baptist, MD;  Location: [place];  Service: ENT;  Laterality: N/A;   BREAST ENHANCEMENT SURGERY     removal   CATARACT EXTRACTION     COLONOSCOPY     COLONOSCOPY  10/14/09   LAPAROSCOPIC APPENDECTOMY N/A 01/23/2016   Procedure: APPENDECTOMY LAPAROSCOPIC;  Surgeon: Erroll Luna, MD;  Location: [place];  Service:  General;  Laterality: N/A;   POLYPECTOMY  2011   x8   TONSILLECTOMY AND ADENOIDECTOMY     VAGINAL HYSTERECTOMY  1970   uterine cancer     OB History   No obstetric history on file.     Family History  Problem Relation Age of Onset   Colon cancer Father    Esophageal cancer Brother    Esophageal cancer Brother    Asthma Daughter        as a child   Stomach cancer Neg Hx    Rectal cancer Neg Hx     Social History   Tobacco Use   Smoking status: Former    Packs/day: 0.50    Years: 50.00    Pack years: 25.00    Types: Cigarettes    Quit date: 08/01/2017    Years since quitting: 3.5   Smokeless tobacco: Never  Vaping Use   Vaping Use: Never used  Substance Use Topics   Alcohol use: No    Alcohol/week: 0.0 standard drinks   Drug use: No    Home Medications Prior to Admission medications   Medication Sig Start Date End Date Taking? Authorizing Provider  albuterol (PROVENTIL) (2.5 MG/3ML) 0.083% nebulizer solution Take 3 mLs (2.5 mg total) by nebulization every 6 (six) hours as needed for wheezing or shortness of breath. 07/30/19   Magdalen Spatz, NP  albuterol (VENTOLIN HFA) 108 (90 Base) MCG/ACT inhaler Inhale 2 puffs into the lungs every 4 (four) hours as needed for wheezing or shortness of breath. 01/06/21   Parrett, Fonnie Mu, NP  ALPRAZolam (XANAX) 0.25 MG tablet Take 0.25 tablets by mouth daily as needed for anxiety.  07/22/17   [provider]  amitriptyline (ELAVIL) 100 MG tablet Take 100 mg by mouth at bedtime.     [provider]  atenolol (TENORMIN) 50 MG tablet Take 50 mg by mouth daily.    [provider]  atorvastatin (LIPITOR) 40 MG tablet Take 40 mg by mouth every other day. At night    [provider]  cholecalciferol (VITAMIN D3) 25 MCG (1000 UT) tablet Take 1,000 Units by mouth daily.    [provider]  docusate sodium (COLACE) 100 MG capsule You can buy this at any drug store, over the counter and follow  package instructions Patient taking differently: Take 100 mg by mouth daily. 02/01/16   Earnstine Regal, PA-C  doxycycline (VIBRA-TABS) 100 MG tablet Take 1 tablet (100 mg total) by mouth 2 (two) times daily. 10/06/20   Tanda Rockers, MD  doxycycline (VIBRA-TABS) 100 MG tablet Take 1 tablet (100 mg total) by mouth 2 (two) times daily. 01/06/21   Parrett, Fonnie Mu, NP  EPINEPHrine 0.3 mg/0.3 mL IJ SOAJ injection Inject 0.3 mLs into the muscle once as needed for anaphylaxis.     [provider]  ezetimibe (ZETIA) 10 MG tablet Take 10 mg by mouth at bedtime.     [provider]  hydrochlorothiazide (HYDRODIURIL) 25 MG tablet Take 25 mg by mouth daily.    [provider]  losartan (COZAAR) 100 MG tablet Take 100 mg by mouth daily.    [provider]  omeprazole (PRILOSEC OTC) 20 MG tablet Take 1 tablet (20 mg total) by mouth daily before breakfast. 01/29/20   Tanda Rockers, MD  predniSONE (DELTASONE) 10 MG tablet 4 tabs for 2 days, then 3 tabs for 2 days, 2 tabs for 2 days, then 1 tab for 2 days, then stop 01/06/21   Parrett, Tammy S, NP  Probiotic CAPS Take 1 capsule by mouth daily.    [provider]  Respiratory Therapy Supplies (FLUTTER) DEVI 1 Device by Does not apply route 2 (two) times daily. 06/08/19   [name], NP  Tiotropium Bromide-Olodaterol (STIOLTO RESPIMAT) 2.5-2.5 MCG/ACT AERS Inhale 2 puffs into the lungs daily. 01/06/21   Parrett, Fonnie Mu, NP  traMADol (ULTRAM) 50 MG tablet SMARTSIG:1 Tablet(s) By Mouth Every 12 Hours PRN 02/27/19   [provider]    Allergies    Eggs or egg-derived products  Review of Systems   Review of Systems  Constitutional:  Negative for fever.  Gastrointestinal:  Positive for hematochezia.  All other systems reviewed and are negative.  Physical Exam Updated Vital Signs BP (!) 142/67   Pulse 90   Temp 98 F (36.7 C) (Oral)   Resp (!) 21   Ht _0  (1.727 m)   Wt 63.5 kg   SpO2 92%    BMI 21.29 kg/m   Physical Exam Vitals and nursing note reviewed.  Constitutional:      General: She is not in acute distress.    Appearance: She is well-developed. She is not diaphoretic.  HENT:     Head: Normocephalic and atraumatic.  Cardiovascular:     Rate and Rhythm: Normal rate and regular rhythm.     Heart sounds: No murmur heard.   No friction rub. No gallop.  Pulmonary:     Effort: Pulmonary effort is normal. No respiratory distress.     Breath sounds: Normal breath sounds. No wheezing.  Abdominal:     General: Bowel sounds are normal. There is no distension.     Palpations: Abdomen is soft.     Tenderness: There is no abdominal tenderness.  Genitourinary:    Comments: There is obviously bloody, maroon-colored stool in the rectal vault.  There are no obvious inflamed external hemorrhoids. Musculoskeletal:        General: Normal range of motion.     Cervical back: Normal range of motion and neck supple.  Skin:    General: Skin is warm and dry.  Neurological:     General: No focal deficit present.     Mental Status: She is alert and oriented to person, place, and time.    ED Results / Procedures / Treatments   Labs (all labs ordered are listed, but only abnormal results are displayed) Labs Reviewed  COMPREHENSIVE METABOLIC PANEL  CBC WITH DIFFERENTIAL/PLATELET  PROTIME-INR  TYPE AND SCREEN    EKG None  Radiology No results found.  Procedures Procedures   Medications Ordered in ED Medications  sodium chloride 0.9 % bolus 500 mL (has no administration in time range)    ED Course  I have reviewed the triage vital signs and the nursing notes.  Pertinent labs & imaging results that were available during my care of the patient were reviewed by me and considered in my medical decision making (see chart for details).    MDM Rules/Calculators/A&P  Patient presenting here with  complaints of rectal bleeding.  Patient had a bowel movement this evening and  reddish colored stool was noted.  She denies having abdominal pain, chest pain, dizziness, or shortness of breath.  She arrives here with stable vital signs, but oxygen saturations in the upper 80s.  She was recently diagnosed with COVID-19 earlier this week and patient was placed on oxygen.  Chest x-ray shows perhaps an infiltrate in the left base which I suspect is COVID-related.  Her hemoglobin is 10.8 which is down several grams from her baseline.  She has had no further bleeding here in the ER, but I feel as though patient should be admitted for observation and possible endoscopy if she worsens.  The stool on her rectal exam was maroon in color and I doubt related to hemorrhoids or fissure.  Final Clinical Impression(s) / ED Diagnoses Final diagnoses:  None    Rx / DC Orders ED Discharge Orders     None        Veryl Speak, MD 02/08/21 [id number]

## 2021-02-08 NOTE — Consult Note (Signed)
Referring Provider: Cherylann Ratel DO Primary Care Physician:  Shon Baton, MD Primary Gastroenterologist:  Dr. Lucio Edward  Reason for Consultation:  Rectal bleeding   IMPRESSION:  Painless rectal bleeding x 1 with no source identified on rectal exam today Normocytic anemia with hemoglobin 10.8    - hemoglobin was 11.3 08/30/20 Chronic constipation with abdominal pain Severe diverticulosis History of colon polyps Pneumonia - ? COVID versus PNA  She appears to have chronic anemia. Mild decrease in magnitude may reflect acute illness with pneumonia. Isolated episode of painless rectal bleeding most likely due to internal hemorrhoids or start of diverticular bleeding. She has declined surveillance endoscopy for years and remains uninterested in any endoscopic evaluation even if she is bleeding at this time.      PLAN: - Serial hgb/hct with transfusion as indicated - CT angiogram +/- angiogram with significant overt bleeding - Consider endoscopic evaluation after resolution of pneumonia if there is additional bleeding and she wishes to proceed  Recommendations discussed with Dr. Marylyn Ishihara. The inpatient GI team will be available on stand-by. Please call with any questions or concerns.   HPI: Heidi Hobbs is a 80 y.o. female seen at the request of Dr. Marylyn Ishihara for further evaluate of rectal bleeding. The history is obtained through the patient, review of her electronic health record, and my discussion with Dr. Marylyn Ishihara.  She has bronchiectasis, polymyalgia rheumatica, chronic headache, rucerrent UTI, HTN, anxiety, HLD. Admitted this morning with pneumonia - possibly COVID versus community aquired pneumonia. But, what actually brought her in was the passage of one blood bowel movement. There was no associated abdominal pain or cramping. She has had no further bleeding since that time.   Hemoglobin on admission 10.8, down to 10.3 6 hours later MCV 90.4, RDW 14.9, platelets 244 BUN 8, crt  0.52  Last hemoglobin available to me shows hemoglobin 11.3 08/30/20  She uses ibuprofen daily. Denies the use of other NSAIDs.   She has a life-long history of constipation with multiple visits for constipation and abdominal pain in our office over the years, most recently 05/2020. She may also have a rectocele. She will have a bowel movement every 3-4 days using prunes, Dulcolax, and Metamucil. She will use Miralax as needed. No recent change in bowel habits. No prior history of GI bleeding.   CT to evaluate the pain 07/03/18 showed laxity of the pelvic floor musculature, severe diverticulosis in the left colon, fatty liver, hiatal hernia, duodenal diverticula, and stable adrenal adenomas.   She has had multiple colonoscopies with Dr. Fuller Plan with adenoma. Most recent colonoscopy 04/2011 showed a tubular adenoma, moderate left sided diverticulosis, a tortuous and redundant colon, and and polypectomy scar at the hepatic flexure. Surveillance recommended in 3 years but not performed.  She declined colonoscopy on office follow-up in 2020 and 2022.        Past Medical History:  Diagnosis Date   Adenomatous colon polyp 1995, 2011   Anemia    Anxiety    Bronchiectasis ([place])    Cancer ([place])    uterine cancer   Carotid stenosis    40-59% right, less than 40% left   Cataracts, bilateral    COPD (chronic obstructive pulmonary disease) ([place])    Dyspnea    Essential hypertension 02/13/2016   Hyperlipidemia    Hypertension    MAC (mycobacterium avium-intracellulare complex)    Macular degeneration, dry 02/13/2016   Polio 1959   Stomach ulcer 1960   Urinary tract infection  Vasculitis ([place])    Normocomplementemic urticarial    Vasculitis ([place]) 02/13/2016   Followed by [facility]     Past Surgical History:  Procedure Laterality Date   ANTERIOR CERVICAL DECOMP/DISCECTOMY FUSION N/A 11/27/2018   Procedure: CERVICAL FIVE TO SIX , CERVICAL SIX TO SEVEN, ANTERIOR CERVICAL  DECOMPRESSION/DISCECTOMY FUSION, ALLOGRAFT, PLATE;  Surgeon: [name], MD;  Location: [facility];  Service: Orthopedics;  Laterality: N/A;   APPENDECTOMY     rupture appendix   ARTERY BIOPSY N/A 08/17/2019   Procedure: BIOPSY TEMPORAL ARTERY;  Surgeon: Leta Baptist, MD;  Location: [facility];  Service: ENT;  Laterality: N/A;   BREAST ENHANCEMENT SURGERY     removal   CATARACT EXTRACTION     COLONOSCOPY     COLONOSCOPY  10/14/09   LAPAROSCOPIC APPENDECTOMY N/A 01/23/2016   Procedure: APPENDECTOMY LAPAROSCOPIC;  Surgeon: Erroll Luna, MD;  Location: [facility];  Service: General;  Laterality: N/A;   POLYPECTOMY  2011   x8   TONSILLECTOMY AND ADENOIDECTOMY     VAGINAL HYSTERECTOMY  1970   uterine cancer     Current Facility-Administered Medications  Medication Dose Route Frequency Provider Last Rate Last Admin   acetaminophen (TYLENOL) tablet 650 mg  650 mg Oral Q6H PRN Marylyn Ishihara, Tyrone A, DO       amitriptyline (ELAVIL) tablet 100 mg  100 mg Oral QHS Kyle, Tyrone A, DO       ascorbic acid (VITAMIN C) tablet 500 mg  500 mg Oral Daily Marylyn Ishihara, Tyrone A, DO   500 mg at 02/08/21 1039   atenolol (TENORMIN) tablet 50 mg  50 mg Oral Daily Kyle, Tyrone A, DO   50 mg at 02/08/21 1039   [START ON 02/09/2021] atorvastatin (LIPITOR) tablet 40 mg  40 mg Oral QODAY Kyle, Tyrone A, DO       chlorpheniramine-HYDROcodone (TUSSIONEX) 10-8 MG/5ML suspension 5 mL  5 mL Oral Q12H PRN Marylyn Ishihara, Tyrone A, DO       dexamethasone (DECADRON) injection 6 mg  6 mg Intravenous Daily Kyle, Tyrone A, DO   6 mg at 02/08/21 1038   docusate sodium (COLACE) capsule 100 mg  100 mg Oral QHS Kyle, Tyrone A, DO       ezetimibe (ZETIA) tablet 10 mg  10 mg Oral QHS Kyle, Tyrone A, DO       guaiFENesin-dextromethorphan (ROBITUSSIN DM) 100-10 MG/5ML syrup 10 mL  10 mL Oral Q4H PRN Marylyn Ishihara, Tyrone A, DO       hydrALAZINE (APRESOLINE) tablet 25 mg  25 mg Oral BID Marylyn Ishihara, Tyrone A, DO   25 mg at 02/08/21 1145   hydrochlorothiazide  (HYDRODIURIL) tablet 25 mg  25 mg Oral Daily Kyle, Tyrone A, DO   25 mg at 02/08/21 1039   Ipratropium-Albuterol (COMBIVENT) respimat 1 puff  1 puff Inhalation Q6H Kyle, Tyrone A, DO   1 puff at 02/08/21 1430   losartan (COZAAR) tablet 100 mg  100 mg Oral Daily Kyle, Tyrone A, DO   100 mg at 02/08/21 1039   ondansetron (ZOFRAN) tablet 4 mg  4 mg Oral Q6H PRN Marylyn Ishihara, Tyrone A, DO       Or   ondansetron (ZOFRAN) injection 4 mg  4 mg Intravenous Q6H PRN Marylyn Ishihara, Tyrone A, DO       pantoprazole (PROTONIX) injection 40 mg  40 mg Intravenous Q12H Kyle, Tyrone A, DO   40 mg at 02/08/21 1025   potassium chloride SA (KLOR-CON) CR tablet 40 mEq  40 mEq Oral BID Marylyn Ishihara, Tyrone A, DO       [START ON 02/09/2021] remdesivir 100 mg in sodium chloride 0.9 % 100 mL IVPB  100 mg Intravenous Daily Kyle, Tyrone A, DO       traMADol (ULTRAM) tablet 50 mg  50 mg Oral QHS PRN Marylyn Ishihara, Tyrone A, DO       zinc sulfate capsule 220 mg  220 mg Oral Daily Kyle, Tyrone A, DO   220 mg at 02/08/21 1039    Allergies as of 02/08/2021 - Review Complete 02/08/2021  Allergen Reaction Noted   Eggs or egg-derived products  07/23/2014    Family History  Problem Relation Age of Onset   Colon cancer Father    Esophageal cancer Brother    Esophageal cancer Brother    Asthma Daughter        as a child   Stomach cancer Neg Hx    Rectal cancer Neg Hx       Physical Exam: Temp:  [98 F (36.7 C)-98.6 F (37 C)] 98.6 F (37 C) (11/27 1633) Pulse Rate:  [79-95] 80 (11/27 1633) Resp:  [16-27] 18 (11/27 1233) BP: (130-160)/(61-106) 156/80 (11/27 1633) SpO2:  [88 %-100 %] 100 % (11/27 1633) Weight:  [63.5 kg] 63.5 kg (11/27 0258)   General:   Alert,  well-nourished, pleasant and cooperative in NAD Head:  Normocephalic and atraumatic. Eyes:  Sclera clear, no icterus.   Conjunctiva pink. Ears:  Normal auditory acuity. Nose:  No deformity, discharge,  or lesions. Mouth:  No deformity or lesions.   Neck:  Supple; no masses or  thyromegaly. Lungs:  Clear throughout to auscultation.   No wheezes. Heart:  Regular rate and rhythm; no murmurs. Abdomen:  Soft, nontender, nondistended, normal bowel sounds, no rebound or guarding. No hepatosplenomegaly.   Rectal:   No chemical dermatitis. No external hemorrhoids, fissure or fistula. Brown stool in the rectal vault. No mass or fecal impaction.  Msk:  Symmetrical. No boney deformities LAD: No inguinal or umbilical LAD Extremities:  No clubbing or edema. Neurologic:  Alert and  oriented x4;  grossly nonfocal Skin:  Intact without significant lesions or rashes. Psych:  Alert and cooperative. Normal mood and affect.   Lab Results: Recent Labs    02/08/21 0428 02/08/21 1013  WBC 16.9* 12.9*  HGB 10.8* 10.3*  HCT 31.9* 30.7*  PLT 244 245   BMET Recent Labs    02/08/21 0428  NA 133*  K 2.9*  CL 101  CO2 26  GLUCOSE 134*  BUN 8  CREATININE 0.52  CALCIUM 7.8*   LFT Recent Labs    02/08/21 0428  PROT 5.3*  ALBUMIN 2.7*  AST 10*  ALT 9  ALKPHOS 56  BILITOT 0.6   PT/INR Recent Labs    02/08/21 0428  LABPROT 14.4  INR 1.1     Studies/Results: DG Chest Port 1 View  Result Date: 02/08/2021 CLINICAL DATA:  Weakness, hypoxia, COVID positive. EXAM: PORTABLE CHEST 1 VIEW COMPARISON:  10/06/2020. FINDINGS: The heart size and mediastinal contours are within normal limits. Atherosclerotic calcification of the aorta is noted. Mild emphysematous changes are present in the lungs. Apical pleural scarring is noted bilaterally. Mild airspace disease is noted at the left lung base. No effusion or pneumothorax is seen. Cervical spinal fusion hardware is noted. No acute osseous abnormality. IMPRESSION: 1. Mild airspace disease at the left lung base, possible atelectasis or infiltrate. 2. Chronic obstructive pulmonary disease. Electronically Signed   By:  Brett Fairy M.D.   On: 02/08/2021 04:27      Cleon Thoma L. Tarri Glenn, MD, MPH 02/08/2021, 6:01 PM

## 2021-02-08 NOTE — H&P (Signed)
History and Physical    Heidi Hobbs:[id number] DOB: 1940/09/16 DOA: 02/08/2021  PCP: Shon Baton, MD  Patient coming from: Home  Chief Complaint: Rectal bleeding  HPI: Heidi Hobbs is a 80 y.o. female with medical history significant of bronchiectasis, polymyalgia rheumatica, chronic headache, rucerrent UTI, HTN, anxiety, HLD. Presenting with cough, congestion, aches, and dark stools. She reports that her daughter tested positive for COVID 5 - 6 days ago. The next day, she took a home test and was positive. She spoke with her doctor who sent in a Rx for Oval. However, she only took 2 days of it. She continued to have cough, congestions and body aches. This morning, she went to have a BM and noticed that he stool was maroon and there was blood in the toilet. She denies any abdominal pain. She became concerned and came to the ED.   ED Course: She was COVID positive. CXR showed left lung base infiltrate. She was placed on 2L [place]. She was started on CAP coverage. She was FOBT positive. TRH was called for admission.   Review of Systems:  Denies CP, abdominal pain, lightheadedness, dizziness, N/V/D, fever. Review of systems is otherwise negative for all not mentioned in HPI.   PMHx Past Medical History:  Diagnosis Date   Adenomatous colon polyp 1995, 2011   Anemia    Anxiety    Bronchiectasis ([place])    Cancer ([place])    uterine cancer   Carotid stenosis    40-59% right, less than 40% left   Cataracts, bilateral    COPD (chronic obstructive pulmonary disease) ([place])    Dyspnea    Essential hypertension 02/13/2016   Hyperlipidemia    Hypertension    MAC (mycobacterium avium-intracellulare complex)    Macular degeneration, dry 02/13/2016   Polio 1959   Stomach ulcer 1960   Urinary tract infection    Vasculitis ([place])    Normocomplementemic urticarial    Vasculitis ([place]) 02/13/2016   Followed by [facility]     PSHx Past Surgical History:  Procedure Laterality Date    ANTERIOR CERVICAL DECOMP/DISCECTOMY FUSION N/A 11/27/2018   Procedure: CERVICAL FIVE TO SIX , CERVICAL SIX TO SEVEN, ANTERIOR CERVICAL DECOMPRESSION/DISCECTOMY FUSION, ALLOGRAFT, PLATE;  Surgeon: [name], MD;  Location: [facility];  Service: Orthopedics;  Laterality: N/A;   APPENDECTOMY     rupture appendix   ARTERY BIOPSY N/A 08/17/2019   Procedure: BIOPSY TEMPORAL ARTERY;  Surgeon: Leta Baptist, MD;  Location: [facility];  Service: ENT;  Laterality: N/A;   BREAST ENHANCEMENT SURGERY     removal   CATARACT EXTRACTION     COLONOSCOPY     COLONOSCOPY  10/14/09   LAPAROSCOPIC APPENDECTOMY N/A 01/23/2016   Procedure: APPENDECTOMY LAPAROSCOPIC;  Surgeon: Erroll Luna, MD;  Location: [facility];  Service: General;  Laterality: N/A;   POLYPECTOMY  2011   x8   TONSILLECTOMY AND ADENOIDECTOMY     VAGINAL HYSTERECTOMY  1970   uterine cancer    SocHx  reports that she quit smoking about 3 years ago. Her smoking use included cigarettes. She has a 25.00 pack-year smoking history. She has never used smokeless tobacco. She reports that she does not drink alcohol and does not use drugs.  Allergies  Allergen Reactions   Eggs Or Egg-Derived Products     Skin nodules    FamHx Family History  Problem Relation Age of Onset   Colon cancer Father    Esophageal cancer Brother  Esophageal cancer Brother    Asthma Daughter        as a child   Stomach cancer Neg Hx    Rectal cancer Neg Hx     Prior to Admission medications   Medication Sig Start Date End Date Taking? Authorizing Provider  albuterol (VENTOLIN HFA) 108 (90 Base) MCG/ACT inhaler Inhale 2 puffs into the lungs every 4 (four) hours as needed for wheezing or shortness of breath. 01/06/21  Yes Parrett, Fonnie Mu, NP  ALPRAZolam (XANAX) 0.25 MG tablet Take 0.25 tablets by mouth daily as needed for anxiety.  07/22/17  Yes [provider]  amitriptyline (ELAVIL) 100 MG tablet Take 100 mg by mouth at bedtime.    Yes [provider]  atenolol (TENORMIN) 50 MG tablet Take 50 mg by mouth daily.   Yes [provider]  atorvastatin (LIPITOR) 40 MG tablet Take 40 mg by mouth every other day. At night   Yes [provider]  cholecalciferol (VITAMIN D3) 25 MCG (1000 UT) tablet Take 1,000 Units by mouth daily.   Yes [provider]  docusate sodium (COLACE) 100 MG capsule You can buy this at any drug store, over the counter and follow package instructions Patient taking differently: Take 100 mg by mouth at bedtime. 02/01/16  Yes Earnstine Regal, PA-C  ezetimibe (ZETIA) 10 MG tablet Take 10 mg by mouth at bedtime.    Yes [provider]  hydrochlorothiazide (HYDRODIURIL) 25 MG tablet Take 25 mg by mouth daily.   Yes [provider]  losartan (COZAAR) 100 MG tablet Take 100 mg by mouth daily.   Yes [provider]  omeprazole (PRILOSEC OTC) 20 MG tablet Take 1 tablet (20 mg total) by mouth daily before breakfast. Patient taking differently: Take 20 mg by mouth daily as needed (for acid reflux). 01/29/20  Yes Tanda Rockers, MD  traMADol (ULTRAM) 50 MG tablet Take 50 mg by mouth at bedtime as needed for moderate pain. 02/27/19  Yes [provider]  albuterol (PROVENTIL) (2.5 MG/3ML) 0.083% nebulizer solution Take 3 mLs (2.5 mg total) by nebulization every 6 (six) hours as needed for wheezing or shortness of breath. Patient not taking: Reported on 02/08/2021 07/30/19   Magdalen Spatz, NP  doxycycline (VIBRA-TABS) 100 MG tablet Take 1 tablet (100 mg total) by mouth 2 (two) times daily. Patient not taking: Reported on 02/08/2021 10/06/20   Tanda Rockers, MD  EPINEPHrine 0.3 mg/0.3 mL IJ SOAJ injection Inject 0.3 mLs into the muscle once as needed for anaphylaxis.  Patient not taking: Reported on 02/08/2021    [provider]  hydrALAZINE (APRESOLINE) 25 MG tablet Take 25 mg by mouth 2 (two) times daily. 02/02/21   [provider]  Respiratory  Therapy Supplies (FLUTTER) DEVI 1 Device by Does not apply route 2 (two) times daily. 06/08/19   [name], NP  Tiotropium Bromide-Olodaterol (STIOLTO RESPIMAT) 2.5-2.5 MCG/ACT AERS Inhale 2 puffs into the lungs daily. Patient not taking: Reported on 02/08/2021 01/06/21   Melvenia Needles, NP    Physical Exam: Vitals:   02/08/21 0530 02/08/21 0545 02/08/21 0600 02/08/21 0615  BP: (!) 150/68 (!) 157/69 134/71 (!) 149/71  Pulse: 86 89 85 87  Resp: _0 Temp:      TempSrc:      SpO2: 92% 96% 95% 95%  Weight:      Height:        General: 80 y.o. female resting in  bed in NAD Eyes: PERRL, normal sclera ENMT: Nares patent w/o discharge, orophaynx clear, dentition normal, ears w/o discharge/lesions/ulcers Neck: Supple, trachea midline Cardiovascular: tachy, +S1, S2, no m/g/r, equal pulses throughout Respiratory: scattered rhonchi, cough/spasm, slightly increased WOB on 2L GI: BS+, NDNT, no masses noted, no organomegaly noted MSK: No e/c/c Neuro: A&O x 3, no focal deficits Psyc: Appropriate interaction and affect, calm/cooperative  Labs on Admission: I have personally reviewed following labs and imaging studies  CBC: Recent Labs  Lab 02/08/21 0428  WBC 16.9*  NEUTROABS 13.7*  HGB 10.8*  HCT 31.9*  MCV 90.4  PLT 161   Basic Metabolic Panel: Recent Labs  Lab 02/08/21 0428  NA 133*  K 2.9*  CL 101  CO2 26  GLUCOSE 134*  BUN 8  CREATININE 0.52  CALCIUM 7.8*   GFR: Estimated Creatinine Clearance: 54.3 mL/min (by C-G formula based on SCr of 0.52 mg/dL). Liver Function Tests: Recent Labs  Lab 02/08/21 0428  AST 10*  ALT 9  ALKPHOS 56  BILITOT 0.6  PROT 5.3*  ALBUMIN 2.7*   No results for input(s): LIPASE, AMYLASE in the last 168 hours. No results for input(s): AMMONIA in the last 168 hours. Coagulation Profile: Recent Labs  Lab 02/08/21 0428  INR 1.1   Cardiac Enzymes: No results for input(s): CKTOTAL, CKMB, CKMBINDEX, TROPONINI in the last 168  hours. BNP (last 3 results) No results for input(s): PROBNP in the last 8760 hours. HbA1C: No results for input(s): HGBA1C in the last 72 hours. CBG: No results for input(s): GLUCAP in the last 168 hours. Lipid Profile: No results for input(s): CHOL, HDL, LDLCALC, TRIG, CHOLHDL, LDLDIRECT in the last 72 hours. Thyroid Function Tests: No results for input(s): TSH, T4TOTAL, FREET4, T3FREE, THYROIDAB in the last 72 hours. Anemia Panel: No results for input(s): VITAMINB12, FOLATE, FERRITIN, TIBC, IRON, RETICCTPCT in the last 72 hours. Urine analysis:    Component Value Date/Time   COLORURINE ORANGE (A) 02/25/2019 1130   APPEARANCEUR TURBID (A) 02/25/2019 1130   LABSPEC  02/25/2019 1130    TEST NOT REPORTED DUE TO COLOR INTERFERENCE OF URINE PIGMENT   PHURINE  02/25/2019 1130    TEST NOT REPORTED DUE TO COLOR INTERFERENCE OF URINE PIGMENT   GLUCOSEU (A) 02/25/2019 1130    TEST NOT REPORTED DUE TO COLOR INTERFERENCE OF URINE PIGMENT   HGBUR (A) 02/25/2019 1130    TEST NOT REPORTED DUE TO COLOR INTERFERENCE OF URINE PIGMENT   BILIRUBINUR (A) 02/25/2019 1130    TEST NOT REPORTED DUE TO COLOR INTERFERENCE OF URINE PIGMENT   KETONESUR (A) 02/25/2019 1130    TEST NOT REPORTED DUE TO COLOR INTERFERENCE OF URINE PIGMENT   PROTEINUR (A) 02/25/2019 1130    TEST NOT REPORTED DUE TO COLOR INTERFERENCE OF URINE PIGMENT   UROBILINOGEN 0.2 06/22/2011 1100   NITRITE (A) 02/25/2019 1130    TEST NOT REPORTED DUE TO COLOR INTERFERENCE OF URINE PIGMENT   LEUKOCYTESUR (A) 02/25/2019 1130    TEST NOT REPORTED DUE TO COLOR INTERFERENCE OF URINE PIGMENT    Radiological Exams on Admission: DG Chest Port 1 View  Result Date: 02/08/2021 CLINICAL DATA:  Weakness, hypoxia, COVID positive. EXAM: PORTABLE CHEST 1 VIEW COMPARISON:  10/06/2020. FINDINGS: The heart size and mediastinal contours are within normal limits. Atherosclerotic calcification of the aorta is noted. Mild emphysematous changes are present  in the lungs. Apical pleural scarring is noted bilaterally. Mild airspace disease is noted at the left lung base. No effusion or pneumothorax  is seen. Cervical spinal fusion hardware is noted. No acute osseous abnormality. IMPRESSION: 1. Mild airspace disease at the left lung base, possible atelectasis or infiltrate. 2. Chronic obstructive pulmonary disease. Electronically Signed   By: Brett Fairy M.D.   On: 02/08/2021 04:27    EKG: None obtained in ED  Assessment/Plan COVID PNA vs CAP     - placed in obs, progressive     - started on CAP coverage; check procal; discontinue if negative     - didn't take full course of lagevrio; ?short course remdes?     - guaifenesin, nebs, steroids, IS, flutter  GIB     - no NSIADs, anticoagulation     - spoke with [facility] (Dr. Tarri Glenn); will see     - add protonix     - q4h H&H     - transfuse for Hgb < 7  Hypokalemia     - check Mg2+; replace K+  Bronchiectasis     - continue home meds  DVT prophylaxis: SCDs  Code Status: FULL  Family Communication: None at bedside  Consults called: [facility]   Status is: Observation  The patient remains OBS appropriate and will d/c before 2 midnights.   Jonnie Finner DO [facility]  If 7PM-7AM, please contact night-coverage [email or web address]  02/08/2021, 7:06 AM

## 2021-02-08 NOTE — ED Notes (Signed)
ED TO INPATIENT HANDOFF REPORT  ED Nurse Name and Phone #:   S Name/Age/Gender Heidi Hobbs 80 y.o. female Room/Bed: WA16/WA16  Code Status   Code Status: Prior  Home/SNF/Other Home Patient oriented to: self, place, time, and situation Is this baseline? Yes   Triage Complete: Triage complete  Chief Complaint Acute GI bleeding [K92.2]  Triage Note Pt here BIB [facility] after 1 episode of bright red rectal bleeding after taking laxative today for constipation, pt reports she is COVID+, had 500 NS en route     Allergies Allergies  Allergen Reactions   Eggs Or Egg-Derived Products     Skin nodules    Level of Care/Admitting Diagnosis ED Disposition     ED Disposition  Admit   Condition  --   Comment  Hospital Area: No Name [100102]  Level of Care: Progressive [102]  Admit to Progressive based on following criteria: MULTISYSTEM THREATS such as stable sepsis, metabolic/electrolyte imbalance with or without encephalopathy that is responding to early treatment.  May place patient in observation at [facility] or [facility] if equivalent level of care is available:: No  Covid Evaluation: Symptomatic Person Under Investigation (PUI)  Diagnosis: Acute GI bleeding [151761]  Admitting Physician: Rise Patience [[id number]]  Attending Physician: Beryle Flock          B Medical/Surgery History Past Medical History:  Diagnosis Date   Adenomatous colon polyp 1995, 2011   Anemia    Anxiety    Bronchiectasis ([place])    Cancer ([place])    uterine cancer   Carotid stenosis    40-59% right, less than 40% left   Cataracts, bilateral    COPD (chronic obstructive pulmonary disease) ([place])    Dyspnea    Essential hypertension 02/13/2016   Hyperlipidemia    Hypertension    MAC (mycobacterium avium-intracellulare complex)    Macular degeneration, dry 02/13/2016   Polio 1959   Stomach ulcer 1960   Urinary tract infection    Vasculitis  ([place])    Normocomplementemic urticarial    Vasculitis ([place]) 02/13/2016   Followed by [facility]    Past Surgical History:  Procedure Laterality Date   ANTERIOR CERVICAL DECOMP/DISCECTOMY FUSION N/A 11/27/2018   Procedure: CERVICAL FIVE TO SIX , CERVICAL SIX TO SEVEN, ANTERIOR CERVICAL DECOMPRESSION/DISCECTOMY FUSION, ALLOGRAFT, PLATE;  Surgeon: [name], MD;  Location: [facility];  Service: Orthopedics;  Laterality: N/A;   APPENDECTOMY     rupture appendix   ARTERY BIOPSY N/A 08/17/2019   Procedure: BIOPSY TEMPORAL ARTERY;  Surgeon: Leta Baptist, MD;  Location: [facility];  Service: ENT;  Laterality: N/A;   BREAST ENHANCEMENT SURGERY     removal   CATARACT EXTRACTION     COLONOSCOPY     COLONOSCOPY  10/14/09   LAPAROSCOPIC APPENDECTOMY N/A 01/23/2016   Procedure: APPENDECTOMY LAPAROSCOPIC;  Surgeon: Erroll Luna, MD;  Location: [facility];  Service: General;  Laterality: N/A;   POLYPECTOMY  2011   x8   TONSILLECTOMY AND ADENOIDECTOMY     VAGINAL HYSTERECTOMY  1970   uterine cancer     A IV Location/Drains/Wounds Patient Lines/Drains/Airways Status     Active Line/Drains/Airways     Name Placement date Placement time Site Days   Peripheral IV 02/08/21 20 G Left Forearm 02/08/21  0259  Forearm  less than 1   Incision (Closed) 11/27/18 Neck Left 11/27/18  1410  -- 804   Incision (Closed) 08/17/19 Face Right 08/17/19  6659  -- 541            Intake/Output Last 24 hours  Intake/Output Summary (Last 24 hours) at 02/08/2021 9357 Last data filed at 02/08/2021 0177 Gross per 24 hour  Intake 500 ml  Output --  Net 500 ml    Labs/Imaging Results for orders placed or performed during the hospital encounter of 02/08/21 (from the past 48 hour(s))  POC occult blood, ED Provider will collect     Status: Abnormal   Collection Time: 02/08/21  3:43 AM  Result Value Ref Range   Fecal Occult Bld POSITIVE (A) NEGATIVE  Comprehensive metabolic panel     Status:  Abnormal   Collection Time: 02/08/21  4:28 AM  Result Value Ref Range   Sodium 133 (L) 135 - 145 mmol/L   Potassium 2.9 (L) 3.5 - 5.1 mmol/L   Chloride 101 98 - 111 mmol/L   CO2 26 22 - 32 mmol/L   Glucose, Bld 134 (H) 70 - 99 mg/dL    Comment: Glucose reference range applies only to samples taken after fasting for at least 8 hours.   BUN 8 8 - 23 mg/dL   Creatinine, Ser 0.52 0.44 - 1.00 mg/dL   Calcium 7.8 (L) 8.9 - 10.3 mg/dL   Total Protein 5.3 (L) 6.5 - 8.1 g/dL   Albumin 2.7 (L) 3.5 - 5.0 g/dL   AST 10 (L) 15 - 41 U/L   ALT 9 0 - 44 U/L   Alkaline Phosphatase 56 38 - 126 U/L   Total Bilirubin 0.6 0.3 - 1.2 mg/dL   GFR, Estimated >60 >60 mL/min    Comment: (NOTE) Calculated using the CKD-EPI Creatinine Equation (2021)    Anion gap 6 5 - 15    Comment: Performed at [facility], [place] [address], [place], [place] [address]  CBC with Differential     Status: Abnormal   Collection Time: 02/08/21  4:28 AM  Result Value Ref Range   WBC 16.9 (H) 4.0 - 10.5 K/uL   RBC 3.53 (L) 3.87 - 5.11 MIL/uL   Hemoglobin 10.8 (L) 12.0 - 15.0 g/dL   HCT 31.9 (L) 36.0 - 46.0 %   MCV 90.4 80.0 - 100.0 fL   MCH 30.6 26.0 - 34.0 pg   MCHC 33.9 30.0 - 36.0 g/dL   RDW 14.9 11.5 - 15.5 %   Platelets 244 150 - 400 K/uL   nRBC 0.0 0.0 - 0.2 %   Neutrophils Relative % 81 %   Neutro Abs 13.7 (H) 1.7 - 7.7 K/uL   Lymphocytes Relative 10 %   Lymphs Abs 1.7 0.7 - 4.0 K/uL   Monocytes Relative 8 %   Monocytes Absolute 1.4 (H) 0.1 - 1.0 K/uL   Eosinophils Relative 0 %   Eosinophils Absolute 0.0 0.0 - 0.5 K/uL   Basophils Relative 0 %   Basophils Absolute 0.0 0.0 - 0.1 K/uL   Immature Granulocytes 1 %   Abs Immature Granulocytes 0.09 (H) 0.00 - 0.07 K/uL    Comment: Performed at [facility], [place] [address], [place], [place] [address]  Type and screen [place]     Status: None   Collection Time: 02/08/21  4:28 AM  Result Value Ref Range    ABO/RH(D) O POS    Antibody Screen NEG    Sample Expiration      02/11/2021,2359 Performed at [facility], [place] [address], [place], [place] [address]  Protime-INR     Status: None   Collection Time: 02/08/21  4:28 AM  Result Value Ref Range   Prothrombin Time 14.4 11.4 - 15.2 seconds   INR 1.1 0.8 - 1.2    Comment: (NOTE) INR goal varies based on device and disease states. Performed at [facility], [place] [address], [place], [place] [address]    DG Chest Port 1 View  Result Date: 02/08/2021 CLINICAL DATA:  Weakness, hypoxia, COVID positive. EXAM: PORTABLE CHEST 1 VIEW COMPARISON:  10/06/2020. FINDINGS: The heart size and mediastinal contours are within normal limits. Atherosclerotic calcification of the aorta is noted. Mild emphysematous changes are present in the lungs. Apical pleural scarring is noted bilaterally. Mild airspace disease is noted at the left lung base. No effusion or pneumothorax is seen. Cervical spinal fusion hardware is noted. No acute osseous abnormality. IMPRESSION: 1. Mild airspace disease at the left lung base, possible atelectasis or infiltrate. 2. Chronic obstructive pulmonary disease. Electronically Signed   By: Brett Fairy M.D.   On: 02/08/2021 04:27    Pending Labs Unresulted Labs (From admission, onward)     Start     Ordered   02/08/21 0700  ABO/Rh  Once,   R        02/08/21 0700   02/08/21 0615  CBC  Now then every 4 hours,   R (with STAT occurrences)      02/08/21 0614   02/08/21 0614  Resp Panel by RT-PCR (Flu A&B, Covid) Nasopharyngeal Swab  (Tier 2 - Symptomatic/asymptomatic)  Once,   STAT        02/08/21 0613            Vitals/Pain Today's Vitals   02/08/21 0530 02/08/21 0545 02/08/21 0600 02/08/21 0615  BP: (!) 150/68 (!) 157/69 134/71 (!) 149/71  Pulse: 86 89 85 87  Resp: _0 Temp:      TempSrc:      SpO2: 92% 96% 95% 95%  Weight:      Height:      PainSc:        Isolation  Precautions No active isolations  Medications Medications  potassium chloride 10 mEq in 100 mL IVPB (has no administration in time range)  pantoprazole (PROTONIX) injection 40 mg (has no administration in time range)  cefTRIAXone (ROCEPHIN) 2 g in sodium chloride 0.9 % 100 mL IVPB (has no administration in time range)  azithromycin (ZITHROMAX) 500 mg in sodium chloride 0.9 % 250 mL IVPB (has no administration in time range)  sodium chloride 0.9 % bolus 500 mL (0 mLs Intravenous Stopped 02/08/21 0606)    Mobility walks Low fall risk   Focused Assessments    R Recommendations: See Admitting Provider Note  Report given to:   Additional Notes:

## 2021-02-09 DIAGNOSIS — K920 Hematemesis: Secondary | ICD-10-CM | POA: Diagnosis not present

## 2021-02-09 DIAGNOSIS — K922 Gastrointestinal hemorrhage, unspecified: Secondary | ICD-10-CM | POA: Diagnosis not present

## 2021-02-09 LAB — CBC WITH DIFFERENTIAL/PLATELET
Abs Immature Granulocytes: 0.1 10*3/uL — ABNORMAL HIGH (ref 0.00–0.07)
Basophils Absolute: 0 10*3/uL (ref 0.0–0.1)
Basophils Relative: 0 %
Eosinophils Absolute: 0 10*3/uL (ref 0.0–0.5)
Eosinophils Relative: 0 %
HCT: 29.7 % — ABNORMAL LOW (ref 36.0–46.0)
Hemoglobin: 9.9 g/dL — ABNORMAL LOW (ref 12.0–15.0)
Immature Granulocytes: 1 %
Lymphocytes Relative: 16 %
Lymphs Abs: 1.7 10*3/uL (ref 0.7–4.0)
MCH: 30.1 pg (ref 26.0–34.0)
MCHC: 33.3 g/dL (ref 30.0–36.0)
MCV: 90.3 fL (ref 80.0–100.0)
Monocytes Absolute: 0.9 10*3/uL (ref 0.1–1.0)
Monocytes Relative: 8 %
Neutro Abs: 8.3 10*3/uL — ABNORMAL HIGH (ref 1.7–7.7)
Neutrophils Relative %: 75 %
Platelets: 247 10*3/uL (ref 150–400)
RBC: 3.29 MIL/uL — ABNORMAL LOW (ref 3.87–5.11)
RDW: 14.6 % (ref 11.5–15.5)
WBC: 11 10*3/uL — ABNORMAL HIGH (ref 4.0–10.5)
nRBC: 0 % (ref 0.0–0.2)

## 2021-02-09 LAB — URINALYSIS, ROUTINE W REFLEX MICROSCOPIC
Bilirubin Urine: NEGATIVE
Glucose, UA: NEGATIVE mg/dL
Hgb urine dipstick: NEGATIVE
Ketones, ur: 5 mg/dL — AB
Leukocytes,Ua: NEGATIVE
Nitrite: NEGATIVE
Protein, ur: NEGATIVE mg/dL
Specific Gravity, Urine: 1.012 (ref 1.005–1.030)
pH: 6 (ref 5.0–8.0)

## 2021-02-09 LAB — C-REACTIVE PROTEIN: CRP: 4.6 mg/dL — ABNORMAL HIGH (ref <1.0)

## 2021-02-09 LAB — COMPREHENSIVE METABOLIC PANEL
ALT: 9 U/L (ref 0–44)
AST: 10 U/L — ABNORMAL LOW (ref 15–41)
Albumin: 2.6 g/dL — ABNORMAL LOW (ref 3.5–5.0)
Alkaline Phosphatase: 46 U/L (ref 38–126)
Anion gap: 8 (ref 5–15)
BUN: 12 mg/dL (ref 8–23)
CO2: 23 mmol/L (ref 22–32)
Calcium: 7.9 mg/dL — ABNORMAL LOW (ref 8.9–10.3)
Chloride: 105 mmol/L (ref 98–111)
Creatinine, Ser: 0.35 mg/dL — ABNORMAL LOW (ref 0.44–1.00)
GFR, Estimated: >60 mL/min (ref >60)
Glucose, Bld: 120 mg/dL — ABNORMAL HIGH (ref 70–99)
Potassium: 3.8 mmol/L (ref 3.5–5.1)
Sodium: 136 mmol/L (ref 135–145)
Total Bilirubin: 0.4 mg/dL (ref 0.3–1.2)
Total Protein: 5.6 g/dL — ABNORMAL LOW (ref 6.5–8.1)

## 2021-02-09 LAB — MAGNESIUM: Magnesium: 1.9 mg/dL (ref 1.7–2.4)

## 2021-02-09 LAB — D-DIMER, QUANTITATIVE: D-Dimer, Quant: 0.54 ug/mL-FEU — ABNORMAL HIGH (ref 0.00–0.50)

## 2021-02-09 MED ORDER — POLYETHYLENE GLYCOL 3350 17 G PO PACK
17.0000 g | PACK | Freq: Every day | ORAL | Status: DC | PRN
  Administered 2021-02-09: 18:00:00 17 g via ORAL
  Filled 2021-02-09: qty 1

## 2021-02-09 MED ORDER — MAGIC MOUTHWASH W/LIDOCAINE
5.0000 mL | Freq: Four times a day (QID) | ORAL | Status: DC | PRN
  Administered 2021-02-09: 5 mL via ORAL
  Filled 2021-02-09 (×2): qty 5

## 2021-02-09 NOTE — TOC Initial Note (Signed)
Transition of Care [facility]) - Initial/Assessment Note    Patient Details  Name: Heidi Hobbs MRN: [id number] Date of Birth: August 21, 1940  Transition of Care [facility]) CM/SW Contact:    Dessa Phi, RN Phone Number: 02/09/2021, 2:47 PM  Clinical Narrative: From home w/dtr Tammy. Continue to monitor for d/c needs.                  Expected Discharge Plan: Home/Self Care Barriers to Discharge: Continued Medical Work up   Patient Goals and CMS Choice Patient states their goals for this hospitalization and ongoing recovery are:: go home CMS Medicare.gov Compare Post Acute Care list provided to:: Patient Choice offered to / list presented to : Adult Children  Expected Discharge Plan and Services Expected Discharge Plan: Home/Self Care   Discharge Planning Services: CM Consult   Living arrangements for the past 2 months: Single Family Home                                      Prior Living Arrangements/Services Living arrangements for the past 2 months: Single Family Home Lives with:: Adult Children Patient language and need for interpreter reviewed:: Yes Do you feel safe going back to the place where you live?: Yes      Need for Family Participation in Patient Care: No (Comment) Care giver support system in place?: Yes (comment) Current home services: DME (cane,rw,rollator) Criminal Activity/Legal Involvement Pertinent to Current Situation/Hospitalization: No - Comment as needed  Activities of Daily Living Home Assistive Devices/Equipment: Cane (specify quad or straight), Eyeglasses ADL Screening (condition at time of admission) Patient's cognitive ability adequate to safely complete daily activities?: Yes Is the patient deaf or have difficulty hearing?: No Does the patient have difficulty seeing, even when wearing glasses/contacts?: No Does the patient have difficulty concentrating, remembering, or making decisions?: No Patient able to express need for assistance with  ADLs?: Yes Does the patient have difficulty dressing or bathing?: No Independently performs ADLs?: No Does the patient have difficulty walking or climbing stairs?: Yes Weakness of Legs: None Weakness of Arms/Hands: None  Permission Sought/Granted Permission sought to share information with : Case Manager Permission granted to share information with : Yes, Verbal Permission Granted  Share Information with NAME: Case Manager     Permission granted to share info w Relationship: Tammy dtr [phone]     Emotional Assessment Appearance:: Appears stated age Attitude/Demeanor/Rapport: Gracious Affect (typically observed): Accepting Orientation: : Oriented to Self, Oriented to Place, Oriented to  Time, Oriented to Situation Alcohol / Substance Use: Not Applicable Psych Involvement: No (comment)  Admission diagnosis:  Acute GI bleeding [K92.2] Gastrointestinal hemorrhage, unspecified gastrointestinal hemorrhage type [K92.2] Patient Active Problem List   Diagnosis Date Noted   Acute GI bleeding 02/08/2021   Acute respiratory failure ([place]) 08/28/2020   Hyponatremia 08/28/2020   Bilateral exudative age-related macular degeneration ([place]) 04/03/2020   Vitreous hemorrhage of left eye ([place]) 04/03/2020   Vision changes 12/31/2019   Candidiasis, mouth 08/10/2019   Polymyalgia rheumatica ([place]) 06/08/2019   Gastroesophageal reflux disease 06/08/2019   Impingement syndrome of right shoulder 03/23/2019   Voiding dysfunction 03/07/2019   S/P cervical spinal fusion 12/05/2018   Spinal stenosis of cervical region 11/22/2018   Pseudophakia, both eyes 11/15/2018   Dysuria 04/08/2018   Former smoker 07/24/2017   Venous stasis ulcer ([place]) 05/25/2017   Atherosclerosis of native arteries of extremity with intermittent claudication (  [place]) 05/25/2017   Occlusion and stenosis of vertebral artery 05/14/2016   Aortic arch atherosclerosis ([place]) 05/14/2016   Essential hypertension 02/13/2016   Vasculitis  ([place]) 02/13/2016   Macular degeneration, dry 02/13/2016   Acute appendicitis with perforation and peritoneal abscess 01/23/2016   Carotid artery disease ([employer]) 07/31/2015   Chronic urticaria 12/11/2012   Bronchiectasis without acute exacerbation ([place]) 08/18/2012   Cough 08/18/2012   COPD GOLD 1 08/17/2012   Dyslipidemia 09/14/2010   PCP:  Shon Baton, MD Pharmacy:   [id number] [place], [place] [address] Phone: [phone] Fax: [phone]     Social Determinants of Health (SDOH) Interventions    Readmission Risk Interventions No flowsheet data found.

## 2021-02-09 NOTE — Evaluation (Signed)
Physical Therapy One Time Evaluation Patient Details Name: Heidi Hobbs MRN: [id number] DOB: 1940-08-26 Today's Date: 02/09/2021  History of Present Illness  80 year old female with history of bronchiectasis, polymyalgia rheumatica, chronic headache, recurrent UTI, hypertension, COPD, CAD, cervical spine ACDF who presented with cough, congestion, body aches, dark stools.  Her home COVID testing was positive.  She was being treated for COVID as an outpatient.  She continued to have cough, congestion, body aches.  She also noticed maroon-colored stool and presented to the emergency department.  On presentation, COVID PCR was positive.  Chest x-ray showed left lung base infiltrate.  She was placed on 2 L of nasal cannula.  She has been currently managed for community-acquired pneumonia.  Since she had maroon colored stool and FOBT was positive, GI consulted who did not recommend any intervention for now/patient also declined endoscopy evaluation.  Hemoglobin has remained stable.  Clinical Impression  Patient evaluated by Physical Therapy with no further acute PT needs identified. All education has been completed and the patient has no further questions. Pt very pleasant and cooperative.  Pt reports being very active at baseline.  Pt able to ambulate around room and only limited by mild SOB and fatigue however SpO2 remained 95-97% on room air during session.  Pt feels she will quickly return to her baseline upon d/c home and declines HHPT at this time.  Recommend pt continue mobilizing with nursing staff during remainder of hospitalization.  See below for any follow-up Physical Therapy or equipment needs. PT is signing off. Thank you for this referral.      Recommendations for follow up therapy are one component of a multi-disciplinary discharge planning process, led by the attending physician.  Recommendations may be updated based on patient status, additional functional criteria and insurance  authorization.  Follow Up Recommendations No PT follow up    Assistance Recommended at Discharge Intermittent Supervision/Assistance  Functional Status Assessment Patient has had a recent decline in their functional status and demonstrates the ability to make significant improvements in function in a reasonable and predictable amount of time.  Equipment Recommendations  None recommended by PT    Recommendations for Other Services       Precautions / Restrictions Precautions Precautions: Fall      Mobility  Bed Mobility Overal bed mobility: Modified Independent                  Transfers Overall transfer level: Needs assistance Equipment used: None Transfers: Sit to/from Stand Sit to Stand: Min guard;Supervision           General transfer comment: min/guard for safety, pt utilizes UEs to self assist    Ambulation/Gait Ambulation/Gait assistance: Min Gaffer (Feet): 22 Feet Assistive device: Rolling walker (2 wheels) Gait Pattern/deviations: Step-through pattern;Decreased stride length Gait velocity: decr     General Gait Details: pt requested RW for improved stability with ambulating (declined hallway ambulation and remained in room), pt performed approx 22 feet and then requested recliner due to fatigue  Stairs            Wheelchair Mobility    Modified Rankin (Stroke Patients Only)       Balance Overall balance assessment: Needs assistance Sitting-balance support: No upper extremity supported;Feet supported Sitting balance-Leahy Scale: Good     Standing balance support: No upper extremity supported Standing balance-Leahy Scale: Fair  Pertinent Vitals/Pain Pain Assessment: No/denies pain    Home Living Family/patient expects to be discharged to:: Private residence Living Arrangements: Children   Type of Home: House Home Access: Ramped entrance       [place]:  Able to live on main level with bedroom/bathroom Home Equipment: [place] (2 wheels);Cane - single point      Prior Function Prior Level of Function : Independent/Modified Independent             Mobility Comments: pt reports being very active typically       Hand Dominance        Extremity/Trunk Assessment        Lower Extremity Assessment Lower Extremity Assessment: Generalized weakness       Communication   Communication: HOH (also reports macular degeneration)  Cognition Arousal/Alertness: Awake/alert Behavior During Therapy: WFL for tasks assessed/performed Overall Cognitive Status: Within Functional Limits for tasks assessed                                          General Comments      Exercises     Assessment/Plan    PT Assessment Patient does not need any further PT services  PT Problem List         PT Treatment Interventions      PT Goals (Current goals can be found in the Care Plan section)  Acute Rehab PT Goals PT Goal Formulation: All assessment and education complete, DC therapy    Frequency     Barriers to discharge        Co-evaluation               AM-PAC PT "6 Clicks" Mobility  Outcome Measure Help needed turning from your back to your side while in a flat bed without using bedrails?: None Help needed moving from lying on your back to sitting on the side of a flat bed without using bedrails?: None Help needed moving to and from a bed to a chair (including a wheelchair)?: None Help needed standing up from a chair using your arms (e.g., wheelchair or bedside chair)?: None Help needed to walk in hospital room?: A Little Help needed climbing 3-5 steps with a railing? : A Little 6 Click Score: 22    End of Session Equipment Utilized During Treatment: Gait belt Activity Tolerance: Patient tolerated treatment well Patient left: in chair;with call bell/phone within reach;with chair alarm set Nurse  Communication: Mobility status PT Visit Diagnosis: Difficulty in walking, not elsewhere classified (R26.2)    Time: 2458-0998 PT Time Calculation (min) (ACUTE ONLY): 16 min   Charges:   PT Evaluation $PT Eval Low Complexity: 1 Low        Kati PT, DPT Acute Rehabilitation Services Pager: [phone] Office: [place] 02/09/2021, 4:23 PM

## 2021-02-09 NOTE — Care Management Obs Status (Signed)
[facility] NOTIFICATION   Patient Details  Name: Heidi Hobbs MRN: [id number] Date of Birth: 1940/04/10   Medicare Observation Status Notification Given:  Yes    Dessa Phi, RN 02/09/2021, 2:44 PM

## 2021-02-09 NOTE — Progress Notes (Signed)
PROGRESS NOTE    Heidi Hobbs  JAS:[id number] DOB: 1940-05-13 DOA: 02/08/2021 PCP: Shon Baton, MD   Chief Complain: Rectal bleeding  Brief Narrative: Patient is a 80 year old female with history of bronchiectasis, polymyalgia rheumatica, chronic headache, recurrent UTI, hypertension who presented with cough, congestion, body aches, dark stools.  Her home COVID testing was positive.  She was being treated for COVID as an outpatient.  She continued to have cough, congestion, body aches.  She also noticed maroon-colored stool and presented to the emergency department.  On presentation, COVID PCR was positive.  Chest x-ray showed left lung base infiltrate.  She was placed on 2 L of nasal cannula.  She has been currently managed for community-acquired pneumonia.  Since she had maroon colored stool and FOBT was positive, GI consulted who did not recommend any intervention for now/patient also declined endoscopy evaluation..  Hemoglobin has remained stable.  Assessment & Plan:   Principal Problem:   Acute GI bleeding   COVID-pneumonia/community-acquired pneumonia: Currently respiratory status stable.  She was on 2 L of oxygen per minute earlier ,now on room air. Not on oxygen at home.  Chest x-ray showed left lung base infiltrate.  She was started on abx for community-acquired pneumonia, but is most likely from  [place].  Procalcitonin nonreassuring.  We will discontinue antibiotics Started on remdesivir. Continue inhaler, nebulization treatment, steroid, incentive spirometry, mucolytics.  Continue to monitor inflammatory panel  GI bleed: No history of recent NSAIDs or anticoagulation use.  She noticed maroon-colored stool at home also had painless rectal bleeding.  FOBT positive.  Continue Protonix.  GI consulted did not recommend any intervention for now.  Currently hemoglobin stable in the range of 9.  She has history of severe diverticulosis, history of colonic polyps.  GI thinks that her  rectal bleeding could be from internal hemorrhoids or could be diverticular bleed.  Patient was not interested in endoscopic evaluation even if she is bleeding at this time.  GI recommended endoscopic evaluation after  resolution of respiratory illness.  Hypokalemia: Supplemented and corrected  History of bronchiectasis: Currently stable.  Continue home medications.  History of hypertension: Currently above stable.  Continue current medications: Atenolol, hydralazine, hydrochlorothiazide, losartan  History of hyperlipidemia: Lipitor, Zetia         DVT prophylaxis:SCD Code Status: Full Family Communication: None at bedside Patient status:  Dispo: The patient is from: Home              Anticipated d/c is to: Not determined              Anticipated d/c date is: Not determined at this point  Consultants: GI  Procedures: None  Antimicrobials:  Anti-infectives (From admission, onward)    Start     Dose/Rate Route Frequency Ordered Stop   02/09/21 1000  remdesivir 100 mg in sodium chloride 0.9 % 100 mL IVPB       See Hyperspace for full Linked Orders Report.   100 mg 200 mL/hr over 30 Minutes Intravenous Daily 02/08/21 1002 02/13/21 0959   02/08/21 1100  remdesivir 200 mg in sodium chloride 0.9% 250 mL IVPB       See Hyperspace for full Linked Orders Report.   200 mg 580 mL/hr over 30 Minutes Intravenous Once 02/08/21 1002 02/08/21 1627   02/08/21 0630  cefTRIAXone (ROCEPHIN) 2 g in sodium chloride 0.9 % 100 mL IVPB  Status:  Discontinued        2 g 200 mL/hr over 30 Minutes  Intravenous Every 24 hours 02/08/21 0617 02/08/21 1516   02/08/21 0630  azithromycin (ZITHROMAX) 500 mg in sodium chloride 0.9 % 250 mL IVPB  Status:  Discontinued        500 mg 250 mL/hr over 60 Minutes Intravenous Every 24 hours 02/08/21 0617 02/08/21 1516       Subjective: Patient seen and examined at the bedside this morning.  Hemodynamically stable.  On room air.  Denies any shortness of breath or  cough.  She does not have any significant bloody bowel movement after admission.  Denies any abdominal pain.  Eager to go home.  She lives with daughter.  Objective: Vitals:   02/08/21 1633 02/08/21 2004 02/08/21 2010 02/09/21 0300  BP: (!) 156/80 121/67  (!) 148/77  Pulse: 80 74  86  Resp:  20  16  Temp: 98.6 F (37 C) 98.3 F (36.8 C)  97.7 F (36.5 C)  TempSrc: Oral Oral  Oral  SpO2: 100% 97% 96% 94%  Weight:      Height:        Intake/Output Summary (Last 24 hours) at 02/09/2021 0935 Last data filed at 02/08/2021 1830 Gross per 24 hour  Intake 1262.39 ml  Output --  Net 1262.39 ml   Filed Weights   02/08/21 0258  Weight: 63.5 kg    Examination:  General exam: Overall comfortable, not in distress, pleasant elderly female HEENT: PERRL Respiratory system:  no wheezes or crackles  Cardiovascular system: S1 & S2 heard, RRR.  Gastrointestinal system: Abdomen is nondistended, soft and nontender. Central nervous system: Alert and oriented Extremities: No edema, no clubbing ,no cyanosis Skin: No rashes, no ulcers,no icterus      Data Reviewed: I have personally reviewed following labs and imaging studies  CBC: Recent Labs  Lab 02/08/21 0428 02/08/21 1013 02/09/21 0459  WBC 16.9* 12.9* 11.0*  NEUTROABS 13.7*  --  8.3*  HGB 10.8* 10.3* 9.9*  HCT 31.9* 30.7* 29.7*  MCV 90.4 90.3 90.3  PLT 244 245 315   Basic Metabolic Panel: Recent Labs  Lab 02/08/21 0428 02/09/21 0459  NA 133* 136  K 2.9* 3.8  CL 101 105  CO2 26 23  GLUCOSE 134* 120*  BUN 8 12  CREATININE 0.52 0.35*  CALCIUM 7.8* 7.9*  MG  --  1.9   GFR: Estimated Creatinine Clearance: 54.3 mL/min (A) (by C-G formula based on SCr of 0.35 mg/dL (L)). Liver Function Tests: Recent Labs  Lab 02/08/21 0428 02/09/21 0459  AST 10* 10*  ALT 9 9  ALKPHOS 56 46  BILITOT 0.6 0.4  PROT 5.3* 5.6*  ALBUMIN 2.7* 2.6*   No results for input(s): LIPASE, AMYLASE in the last 168 hours. No results for  input(s): AMMONIA in the last 168 hours. Coagulation Profile: Recent Labs  Lab 02/08/21 0428  INR 1.1   Cardiac Enzymes: No results for input(s): CKTOTAL, CKMB, CKMBINDEX, TROPONINI in the last 168 hours. BNP (last 3 results) No results for input(s): PROBNP in the last 8760 hours. HbA1C: No results for input(s): HGBA1C in the last 72 hours. CBG: No results for input(s): GLUCAP in the last 168 hours. Lipid Profile: No results for input(s): CHOL, HDL, LDLCALC, TRIG, CHOLHDL, LDLDIRECT in the last 72 hours. Thyroid Function Tests: No results for input(s): TSH, T4TOTAL, FREET4, T3FREE, THYROIDAB in the last 72 hours. Anemia Panel: No results for input(s): VITAMINB12, FOLATE, FERRITIN, TIBC, IRON, RETICCTPCT in the last 72 hours. Sepsis Labs: Recent Labs  Lab 02/08/21 1013  PROCALCITON <  0.10    Recent Results (from the past 240 hour(s))  Resp Panel by RT-PCR (Flu A&B, Covid) Nasopharyngeal Swab     Status: Abnormal   Collection Time: 02/08/21  6:20 AM   Specimen: Nasopharyngeal Swab; Nasopharyngeal(NP) swabs in vial transport medium  Result Value Ref Range Status   SARS Coronavirus 2 by RT PCR POSITIVE (A) NEGATIVE Final    Comment: CRITICAL RESULT CALLED TO, READ BACK BY AND VERIFIED WITH: DIAMOND, RN @ [id number] ON 02/08/2021 BY LBROOKS, MLT (NOTE) SARS-CoV-2 target nucleic acids are DETECTED.  The SARS-CoV-2 RNA is generally detectable in upper respiratory specimens during the acute phase of infection. Positive results are indicative of the presence of the identified virus, but do not rule out bacterial infection or co-infection with other pathogens not detected by the test. Clinical correlation with patient history and other diagnostic information is necessary to determine patient infection status. The expected result is Negative.  Fact Sheet for Patients: EntrepreneurPulse.com.au  Fact Sheet for Healthcare  Providers: IncredibleEmployment.be  This test is not yet approved or cleared by the [place] FDA and  has been authorized for detection and/or diagnosis of SARS-CoV-2 by FDA under an Emergency Use Authorization (EUA).  This EUA will remain in effect ( meaning this test can be used) for the duration of  the COVID-19 declaration under Section 564(b)(1) of the Act, 21 U.S.C. section 360bbb-3(b)(1), unless the authorization is terminated or revoked sooner.     Influenza A by PCR NEGATIVE NEGATIVE Final   Influenza B by PCR NEGATIVE NEGATIVE Final    Comment: (NOTE) The Xpert Xpress SARS-CoV-2/FLU/RSV plus assay is intended as an aid in the diagnosis of influenza from Nasopharyngeal swab specimens and should not be used as a sole basis for treatment. Nasal washings and aspirates are unacceptable for Xpert Xpress SARS-CoV-2/FLU/RSV testing.  Fact Sheet for Patients: EntrepreneurPulse.com.au  Fact Sheet for Healthcare Providers: IncredibleEmployment.be  This test is not yet approved or cleared by the [place] FDA and has been authorized for detection and/or diagnosis of SARS-CoV-2 by FDA under an Emergency Use Authorization (EUA). This EUA will remain in effect (meaning this test can be used) for the duration of the COVID-19 declaration under Section 564(b)(1) of the Act, 21 U.S.C. section 360bbb-3(b)(1), unless the authorization is terminated or revoked.  Performed at [facility], [place] [address] [place], [place] [address]          Radiology Studies: [facility] Chest Port 1 View  Result Date: 02/08/2021 CLINICAL DATA:  Weakness, hypoxia, COVID positive. EXAM: PORTABLE CHEST 1 VIEW COMPARISON:  10/06/2020. FINDINGS: The heart size and mediastinal contours are within normal limits. Atherosclerotic calcification of the aorta is noted. Mild emphysematous changes are present in the lungs. Apical pleural  scarring is noted bilaterally. Mild airspace disease is noted at the left lung base. No effusion or pneumothorax is seen. Cervical spinal fusion hardware is noted. No acute osseous abnormality. IMPRESSION: 1. Mild airspace disease at the left lung base, possible atelectasis or infiltrate. 2. Chronic obstructive pulmonary disease. Electronically Signed   By: Brett Fairy M.D.   On: 02/08/2021 04:27        Scheduled Meds:  amitriptyline  100 mg Oral QHS   vitamin C  500 mg Oral Daily   atenolol  50 mg Oral Daily   atorvastatin  40 mg Oral QODAY   dexamethasone (DECADRON) injection  6 mg Intravenous Daily   docusate sodium  100 mg Oral QHS   ezetimibe  10 mg Oral QHS   hydrALAZINE  25 mg Oral BID   hydrochlorothiazide  25 mg Oral Daily   Ipratropium-Albuterol  1 puff Inhalation Q6H   losartan  100 mg Oral Daily   pantoprazole (PROTONIX) IV  40 mg Intravenous Q12H   potassium chloride  40 mEq Oral BID   zinc sulfate  220 mg Oral Daily   Continuous Infusions:  remdesivir 100 mg in NS 100 mL       LOS: 0 days    Time spent:25 mins, More than 50% of that time was spent in counseling and/or coordination of care.      Shelly Coss, MD [facility] P11/28/2022, 9:35 AM

## 2021-02-10 DIAGNOSIS — K920 Hematemesis: Secondary | ICD-10-CM | POA: Diagnosis not present

## 2021-02-10 DIAGNOSIS — K922 Gastrointestinal hemorrhage, unspecified: Secondary | ICD-10-CM | POA: Diagnosis not present

## 2021-02-10 LAB — COMPREHENSIVE METABOLIC PANEL
ALT: 11 U/L (ref 0–44)
AST: 15 U/L (ref 15–41)
Albumin: 2.9 g/dL — ABNORMAL LOW (ref 3.5–5.0)
Alkaline Phosphatase: 51 U/L (ref 38–126)
Anion gap: 6 (ref 5–15)
BUN: 14 mg/dL (ref 8–23)
CO2: 22 mmol/L (ref 22–32)
Calcium: 8.2 mg/dL — ABNORMAL LOW (ref 8.9–10.3)
Chloride: 107 mmol/L (ref 98–111)
Creatinine, Ser: 0.51 mg/dL (ref 0.44–1.00)
GFR, Estimated: >60 mL/min (ref >60)
Glucose, Bld: 103 mg/dL — ABNORMAL HIGH (ref 70–99)
Potassium: 4.5 mmol/L (ref 3.5–5.1)
Sodium: 135 mmol/L (ref 135–145)
Total Bilirubin: 0.5 mg/dL (ref 0.3–1.2)
Total Protein: 5.9 g/dL — ABNORMAL LOW (ref 6.5–8.1)

## 2021-02-10 LAB — CBC WITH DIFFERENTIAL/PLATELET
Abs Immature Granulocytes: 0.11 10*3/uL — ABNORMAL HIGH (ref 0.00–0.07)
Basophils Absolute: 0 10*3/uL (ref 0.0–0.1)
Basophils Relative: 0 %
Eosinophils Absolute: 0 10*3/uL (ref 0.0–0.5)
Eosinophils Relative: 0 %
HCT: 30 % — ABNORMAL LOW (ref 36.0–46.0)
Hemoglobin: 10.2 g/dL — ABNORMAL LOW (ref 12.0–15.0)
Immature Granulocytes: 1 %
Lymphocytes Relative: 23 %
Lymphs Abs: 3 10*3/uL (ref 0.7–4.0)
MCH: 30.1 pg (ref 26.0–34.0)
MCHC: 34 g/dL (ref 30.0–36.0)
MCV: 88.5 fL (ref 80.0–100.0)
Monocytes Absolute: 1.3 10*3/uL — ABNORMAL HIGH (ref 0.1–1.0)
Monocytes Relative: 10 %
Neutro Abs: 8.3 10*3/uL — ABNORMAL HIGH (ref 1.7–7.7)
Neutrophils Relative %: 66 %
Platelets: 324 10*3/uL (ref 150–400)
RBC: 3.39 MIL/uL — ABNORMAL LOW (ref 3.87–5.11)
RDW: 14.8 % (ref 11.5–15.5)
WBC: 12.7 10*3/uL — ABNORMAL HIGH (ref 4.0–10.5)
nRBC: 0 % (ref 0.0–0.2)

## 2021-02-10 LAB — C-REACTIVE PROTEIN: CRP: 1.2 mg/dL — ABNORMAL HIGH (ref <1.0)

## 2021-02-10 LAB — MAGNESIUM: Magnesium: 1.9 mg/dL (ref 1.7–2.4)

## 2021-02-10 LAB — URINE CULTURE: Culture: NO GROWTH

## 2021-02-10 LAB — D-DIMER, QUANTITATIVE: D-Dimer, Quant: 0.43 ug/mL-FEU (ref 0.00–0.50)

## 2021-02-10 MED ORDER — POLYETHYLENE GLYCOL 3350 17 G PO PACK: 17.0000 g | PACK | Freq: Every day | ORAL | 0 refills | Status: DC

## 2021-02-10 MED ORDER — ASCORBIC ACID 500 MG PO TABS
500.0000 mg | ORAL_TABLET | Freq: Every day | ORAL | 0 refills | Status: AC
Start: 2021-02-11 — End: 2021-02-18

## 2021-02-10 MED ORDER — ZINC SULFATE 220 (50 ZN) MG PO CAPS: 220.0000 mg | ORAL_CAPSULE | Freq: Every day | ORAL | 0 refills | Status: AC

## 2021-02-10 NOTE — Progress Notes (Signed)
RN reviewed patient's discharge instructions and medications with patient and her sister. IV's x 2 removed and sites were CDI. All questions addressed. Pt left in stable condition via private vehicle with sister with all belongings. No further needs at this time.

## 2021-02-10 NOTE — Discharge Summary (Signed)
Physician Discharge Summary  Heidi Hobbs XYI:[id number] DOB: 02-Jul-1940 DOA: 02/08/2021  PCP: Shon Baton, MD  Admit date: 02/08/2021 Discharge date: 02/10/2021  Admitted From: Home Disposition:  Home  Discharge Condition:Stable CODE STATUS:FULL Diet recommendation: Heart Healthy  Brief/Interim Summary:  Patient is a 80 year old female with history of bronchiectasis, polymyalgia rheumatica, chronic headache, recurrent UTI, hypertension who presented with cough, congestion, body aches, dark stools.  Her home COVID testing was positive.  She was being treated for COVID as an outpatient.  She continued to have cough, congestion, body aches.  She also noticed maroon-colored stool and presented to the emergency department.  On presentation, COVID PCR was positive.  Chest x-ray showed left lung base infiltrate.  She was placed on 2 L of nasal cannula.  She was also started on abx for possible  community-acquired pneumonia.  Since she had maroon colored stool and FOBT was positive, GI consulted who did not recommend any intervention for now/patient also declined endoscopy evaluation..  Hemoglobin has remained stable.  Antibiotics discontinued after procalcitonin was found to be nonreassuring.  Her respiratory status is stable and she is on room air.  She is medically stable for discharge to home today.  Following problems were addressed during her hospitalization:  COVID-pneumonia/community-acquired pneumonia: Currently respiratory status stable.  She was on 2 L of oxygen per minute earlier ,now on room air. Not on oxygen at home.  Chest x-ray showed left lung base infiltrate.  She was started on abx for community-acquired pneumonia, but is most likely from  [place]. Procalcitonin nonreassuring.  We will discontinue antibiotics Started on remdesivir.She completed 3 doses   GI bleed: No history of recent NSAIDs or anticoagulation use.  She noticed maroon-colored stool at home also had painless  rectal bleeding.  FOBT positive.  Continue Protonix.  GI consulted did not recommend any intervention for now.  Currently hemoglobin stable in the range of 9.  She has history of severe diverticulosis, history of colonic polyps.  GI thinks that her rectal bleeding could be from internal hemorrhoids or could be diverticular bleed.  Patient was not interested in endoscopic evaluation even if she is bleeding at this time.  GI recommended endoscopic evaluation after  resolution of respiratory illness if she is interested.   Hypokalemia: Supplemented and corrected   History of bronchiectasis: Currently stable.  Continue home medications.   History of hypertension: Currently above stable.  Continue current medications: Atenolol, hydralazine, hydrochlorothiazide, losartan   History of hyperlipidemia: Lipitor, Zetia      Discharge Diagnoses:  Principal Problem:   Acute GI bleeding    Discharge Instructions  Discharge Instructions     Diet - low sodium heart healthy   Complete by: As directed    Discharge instructions   Complete by: As directed    1)Please take your medications as instructed 2)Follow up with your PCP in a week.  Do a CBC just during the follow-up to check your hemoglobin 3)Isolate yourself for total of 7 days from the first day of you positive covid test(02/08/21)   Increase activity slowly   Complete by: As directed       Allergies as of 02/10/2021       Reactions   Eggs Or Egg-derived Products    Skin nodules        Medication List     STOP taking these medications    doxycycline 100 MG tablet Commonly known as: VIBRA-TABS   EPINEPHrine 0.3 mg/0.3 mL Soaj injection Commonly known as:  EPI-PEN       TAKE these medications    albuterol 108 (90 Base) MCG/ACT inhaler Commonly known as: VENTOLIN HFA Inhale 2 puffs into the lungs every 4 (four) hours as needed for wheezing or shortness of breath. What changed: Another medication with the same name was  removed. Continue taking this medication, and follow the directions you see here.   ALPRAZolam 0.25 MG tablet Commonly known as: XANAX Take 0.25 tablets by mouth daily as needed for anxiety.   amitriptyline 100 MG tablet Commonly known as: ELAVIL Take 100 mg by mouth at bedtime.   ascorbic acid 500 MG tablet Commonly known as: VITAMIN C Take 1 tablet (500 mg total) by mouth daily for 7 days. Start taking on: February 11, 2021   atenolol 50 MG tablet Commonly known as: TENORMIN Take 50 mg by mouth daily.   atorvastatin 40 MG tablet Commonly known as: LIPITOR Take 40 mg by mouth every other day. At night   cholecalciferol 25 MCG (1000 UNIT) tablet Commonly known as: VITAMIN D3 Take 1,000 Units by mouth daily.   docusate sodium 100 MG capsule Commonly known as: COLACE You can buy this at any drug store, over the counter and follow package instructions What changed:  how much to take how to take this when to take this additional instructions   ezetimibe 10 MG tablet Commonly known as: ZETIA Take 10 mg by mouth at bedtime.   Flutter Devi 1 Device by Does not apply route 2 (two) times daily.   hydrALAZINE 25 MG tablet Commonly known as: APRESOLINE Take 25 mg by mouth 2 (two) times daily.   hydrochlorothiazide 25 MG tablet Commonly known as: HYDRODIURIL Take 25 mg by mouth daily.   losartan 100 MG tablet Commonly known as: COZAAR Take 100 mg by mouth daily.   omeprazole 20 MG tablet Commonly known as: PRILOSEC OTC Take 1 tablet (20 mg total) by mouth daily before breakfast. What changed:  when to take this reasons to take this   polyethylene glycol 17 g packet Commonly known as: MIRALAX / GLYCOLAX Take 17 g by mouth daily.   Stiolto Respimat 2.5-2.5 MCG/ACT Aers Generic drug: Tiotropium Bromide-Olodaterol Inhale 2 puffs into the lungs daily.   traMADol 50 MG tablet Commonly known as: ULTRAM Take 50 mg by mouth at bedtime as needed for moderate pain.    zinc sulfate 220 (50 Zn) MG capsule Take 1 capsule (220 mg total) by mouth daily for 7 days. Start taking on: February 11, 2021        Allergies  Allergen Reactions   Eggs Or Egg-Derived Products     Skin nodules    Consultations: GI   Procedures/Studies: DG Chest Port 1 View  Result Date: 02/08/2021 CLINICAL DATA:  Weakness, hypoxia, COVID positive. EXAM: PORTABLE CHEST 1 VIEW COMPARISON:  10/06/2020. FINDINGS: The heart size and mediastinal contours are within normal limits. Atherosclerotic calcification of the aorta is noted. Mild emphysematous changes are present in the lungs. Apical pleural scarring is noted bilaterally. Mild airspace disease is noted at the left lung base. No effusion or pneumothorax is seen. Cervical spinal fusion hardware is noted. No acute osseous abnormality. IMPRESSION: 1. Mild airspace disease at the left lung base, possible atelectasis or infiltrate. 2. Chronic obstructive pulmonary disease. Electronically Signed   By: Brett Fairy M.D.   On: 02/08/2021 04:27      Subjective: Patient seen and examined at the bedside this morning.  Hemodynamically stable for discharge.Called and discussed  with daughter on phone about discharge planning  Discharge Exam: Vitals:   02/09/21 1945 02/10/21 0643  BP: (!) 179/76 (!) 167/77  Pulse: 81 79  Resp: 19   Temp: 98.2 F (36.8 C) 98.2 F (36.8 C)  SpO2: 99% 96%   Vitals:   02/09/21 0300 02/09/21 1402 02/09/21 1945 02/10/21 0643  BP: (!) 148/77 137/67 (!) 179/76 (!) 167/77  Pulse: 86 80 81 79  Resp: 16 18 19    Temp: 97.7 F (36.5 C) (!) 97.4 F (36.3 C) 98.2 F (36.8 C) 98.2 F (36.8 C)  TempSrc: Oral Oral Oral Oral  SpO2: 94% 99% 99% 96%  Weight:      Height:        General: Pt is alert, awake, not in acute distress Cardiovascular: RRR, S1/S2 +, no rubs, no gallops Respiratory: CTA bilaterally, no wheezing, no rhonchi Abdominal: Soft, NT, ND, bowel sounds + Extremities: no edema, no  cyanosis    The results of significant diagnostics from this hospitalization (including imaging, microbiology, ancillary and laboratory) are listed below for reference.     Microbiology: Recent Results (from the past 240 hour(s))  Resp Panel by RT-PCR (Flu A&B, Covid) Nasopharyngeal Swab     Status: Abnormal   Collection Time: 02/08/21  6:20 AM   Specimen: Nasopharyngeal Swab; Nasopharyngeal(NP) swabs in vial transport medium  Result Value Ref Range Status   SARS Coronavirus 2 by RT PCR POSITIVE (A) NEGATIVE Final    Comment: CRITICAL RESULT CALLED TO, READ BACK BY AND VERIFIED WITH: DIAMOND, RN @ 8502 ON 02/08/2021 BY LBROOKS, MLT (NOTE) SARS-CoV-2 target nucleic acids are DETECTED.  The SARS-CoV-2 RNA is generally detectable in upper respiratory specimens during the acute phase of infection. Positive results are indicative of the presence of the identified virus, but do not rule out bacterial infection or co-infection with other pathogens not detected by the test. Clinical correlation with patient history and other diagnostic information is necessary to determine patient infection status. The expected result is Negative.  Fact Sheet for Patients: EntrepreneurPulse.com.au  Fact Sheet for Healthcare Providers: IncredibleEmployment.be  This test is not yet approved or cleared by the [place] FDA and  has been authorized for detection and/or diagnosis of SARS-CoV-2 by FDA under an Emergency Use Authorization (EUA).  This EUA will remain in effect ( meaning this test can be used) for the duration of  the COVID-19 declaration under Section 564(b)(1) of the Act, 21 U.S.C. section 360bbb-3(b)(1), unless the authorization is terminated or revoked sooner.     Influenza A by PCR NEGATIVE NEGATIVE Final   Influenza B by PCR NEGATIVE NEGATIVE Final    Comment: (NOTE) The Xpert Xpress SARS-CoV-2/FLU/RSV plus assay is intended as an aid in the  diagnosis of influenza from Nasopharyngeal swab specimens and should not be used as a sole basis for treatment. Nasal washings and aspirates are unacceptable for Xpert Xpress SARS-CoV-2/FLU/RSV testing.  Fact Sheet for Patients: EntrepreneurPulse.com.au  Fact Sheet for Healthcare Providers: IncredibleEmployment.be  This test is not yet approved or cleared by the [place] FDA and has been authorized for detection and/or diagnosis of SARS-CoV-2 by FDA under an Emergency Use Authorization (EUA). This EUA will remain in effect (meaning this test can be used) for the duration of the COVID-19 declaration under Section 564(b)(1) of the Act, 21 U.S.C. section 360bbb-3(b)(1), unless the authorization is terminated or revoked.  Performed at [facility], [place] [address], [place], [place] [address]      Labs:  BNP (last 3 results) No results for input(s): BNP in the last 8760 hours. Basic Metabolic Panel: Recent Labs  Lab 02/08/21 0428 02/09/21 0459 02/10/21 0443  NA 133* 136 135  K 2.9* 3.8 4.5  CL 101 105 107  CO2 26 23 22   GLUCOSE 134* 120* 103*  BUN 8 12 14   CREATININE 0.52 0.35* 0.51  CALCIUM 7.8* 7.9* 8.2*  MG  --  1.9 1.9   Liver Function Tests: Recent Labs  Lab 02/08/21 0428 02/09/21 0459 02/10/21 0443  AST 10* 10* 15  ALT 9 9 11   ALKPHOS 56 46 51  BILITOT 0.6 0.4 0.5  PROT 5.3* 5.6* 5.9*  ALBUMIN 2.7* 2.6* 2.9*   No results for input(s): LIPASE, AMYLASE in the last 168 hours. No results for input(s): AMMONIA in the last 168 hours. CBC: Recent Labs  Lab 02/08/21 0428 02/08/21 1013 02/09/21 0459 02/10/21 0443  WBC 16.9* 12.9* 11.0* 12.7*  NEUTROABS 13.7*  --  8.3* 8.3*  HGB 10.8* 10.3* 9.9* 10.2*  HCT 31.9* 30.7* 29.7* 30.0*  MCV 90.4 90.3 90.3 88.5  PLT 244 245 247 324   Cardiac Enzymes: No results for input(s): CKTOTAL, CKMB, CKMBINDEX, TROPONINI in the last 168 hours. BNP: Invalid  input(s): POCBNP CBG: No results for input(s): GLUCAP in the last 168 hours. D-Dimer Recent Labs    02/09/21 0459 02/10/21 0443  DDIMER 0.54* 0.43   Hgb A1c No results for input(s): HGBA1C in the last 72 hours. Lipid Profile No results for input(s): CHOL, HDL, LDLCALC, TRIG, CHOLHDL, LDLDIRECT in the last 72 hours. Thyroid function studies No results for input(s): TSH, T4TOTAL, T3FREE, THYROIDAB in the last 72 hours.  Invalid input(s): FREET3 Anemia work up No results for input(s): VITAMINB12, FOLATE, FERRITIN, TIBC, IRON, RETICCTPCT in the last 72 hours. Urinalysis    Component Value Date/Time   COLORURINE YELLOW 02/09/2021 1249   APPEARANCEUR CLEAR 02/09/2021 1249   LABSPEC 1.012 02/09/2021 1249   PHURINE 6.0 02/09/2021 1249   GLUCOSEU NEGATIVE 02/09/2021 1249   HGBUR NEGATIVE 02/09/2021 1249   BILIRUBINUR NEGATIVE 02/09/2021 1249   KETONESUR 5 (A) 02/09/2021 1249   PROTEINUR NEGATIVE 02/09/2021 1249   UROBILINOGEN 0.2 06/22/2011 1100   NITRITE NEGATIVE 02/09/2021 1249   LEUKOCYTESUR NEGATIVE 02/09/2021 1249   Sepsis Labs Invalid input(s): PROCALCITONIN,  WBC,  LACTICIDVEN Microbiology Recent Results (from the past 240 hour(s))  Resp Panel by RT-PCR (Flu A&B, Covid) Nasopharyngeal Swab     Status: Abnormal   Collection Time: 02/08/21  6:20 AM   Specimen: Nasopharyngeal Swab; Nasopharyngeal(NP) swabs in vial transport medium  Result Value Ref Range Status   SARS Coronavirus 2 by RT PCR POSITIVE (A) NEGATIVE Final    Comment: CRITICAL RESULT CALLED TO, READ BACK BY AND VERIFIED WITH: DIAMOND, RN @ 2992 ON 02/08/2021 BY LBROOKS, MLT (NOTE) SARS-CoV-2 target nucleic acids are DETECTED.  The SARS-CoV-2 RNA is generally detectable in upper respiratory specimens during the acute phase of infection. Positive results are indicative of the presence of the identified virus, but do not rule out bacterial infection or co-infection with other pathogens not detected by the  test. Clinical correlation with patient history and other diagnostic information is necessary to determine patient infection status. The expected result is Negative.  Fact Sheet for Patients: EntrepreneurPulse.com.au  Fact Sheet for Healthcare Providers: IncredibleEmployment.be  This test is not yet approved or cleared by the [place] FDA and  has been authorized for detection and/or diagnosis of SARS-CoV-2 by FDA under an  Emergency Use Authorization (EUA).  This EUA will remain in effect ( meaning this test can be used) for the duration of  the COVID-19 declaration under Section 564(b)(1) of the Act, 21 U.S.C. section 360bbb-3(b)(1), unless the authorization is terminated or revoked sooner.     Influenza A by PCR NEGATIVE NEGATIVE Final   Influenza B by PCR NEGATIVE NEGATIVE Final    Comment: (NOTE) The Xpert Xpress SARS-CoV-2/FLU/RSV plus assay is intended as an aid in the diagnosis of influenza from Nasopharyngeal swab specimens and should not be used as a sole basis for treatment. Nasal washings and aspirates are unacceptable for Xpert Xpress SARS-CoV-2/FLU/RSV testing.  Fact Sheet for Patients: EntrepreneurPulse.com.au  Fact Sheet for Healthcare Providers: IncredibleEmployment.be  This test is not yet approved or cleared by the [place] FDA and has been authorized for detection and/or diagnosis of SARS-CoV-2 by FDA under an Emergency Use Authorization (EUA). This EUA will remain in effect (meaning this test can be used) for the duration of the COVID-19 declaration under Section 564(b)(1) of the Act, 21 U.S.C. section 360bbb-3(b)(1), unless the authorization is terminated or revoked.  Performed at [facility], [place] [address], [place], [place] [address]     Please note: You were cared for by a hospitalist during your hospital stay. Once you are discharged, your  primary care physician will handle any further medical issues. Please note that NO REFILLS for any discharge medications will be authorized once you are discharged, as it is imperative that you return to your primary care physician (or establish a relationship with a primary care physician if you do not have one) for your post hospital discharge needs so that they can reassess your need for medications and monitor your lab values.    Time coordinating discharge: 40 minutes  SIGNED:   Shelly Coss, MD  [facility] 02/10/2021, 11:00 AM Pager [phone]  If 7PM-7AM, please contact night-coverage [email or web address] Password [email or web address]

## 2021-02-10 NOTE — Evaluation (Signed)
Occupational Therapy Evaluation Patient Details Name: Heidi Hobbs MRN: [id number] DOB: Jul 17, 1940 Today's Date: 02/10/2021   History of Present Illness 80 year old female with history of bronchiectasis, polymyalgia rheumatica, chronic headache, recurrent UTI, hypertension, COPD, CAD, cervical spine ACDF who presented with cough, congestion, body aches, dark stools.  Her home COVID testing was positive.  She was being treated for COVID as an outpatient.  She continued to have cough, congestion, body aches.  She also noticed maroon-colored stool and presented to the emergency department.  On presentation, COVID PCR was positive.  Chest x-ray showed left lung base infiltrate.  She was placed on 2 L of nasal cannula.  She has been currently managed for community-acquired pneumonia.  Since she had maroon colored stool and FOBT was positive, GI consulted who did not recommend any intervention for now/patient also declined endoscopy evaluation.  Hemoglobin has remained stable.   Clinical Impression   Patient evaluated by Occupational Therapy with no  acute OT needs identified. Patients OT needs can be met in next level of care [facility] services. Patient to have 247 support from family at home as well. Patient was MI for toileting with no AD but sup for functional mobility in room. All education has been completed and the patient has no further questions.  See below for any follow-up Occupational Therapy or equipment needs. OT is signing off. Thank you for this referral.       Recommendations for follow up therapy are one component of a multi-disciplinary discharge planning process, led by the attending physician.  Recommendations may be updated based on patient status, additional functional criteria and insurance authorization.   Follow Up Recommendations  Home health OT    Assistance Recommended at Discharge Frequent or constant Supervision/Assistance  Functional Status Assessment  Patient has had a  recent decline in their functional status and demonstrates the ability to make significant improvements in function in a reasonable and predictable amount of time.  Equipment Recommendations  None recommended by OT    Recommendations for Other Services       Precautions / Restrictions Precautions Precautions: Fall Restrictions Weight Bearing Restrictions: No      Mobility Bed Mobility Overal bed mobility: Modified Independent                  Transfers Overall transfer level: Needs assistance Equipment used: None Transfers: Sit to/from Stand Sit to Stand: Supervision                  Balance Overall balance assessment: Needs assistance Sitting-balance support: No upper extremity supported;Feet supported Sitting balance-Leahy Scale: Good     Standing balance support: No upper extremity supported;During functional activity Standing balance-Leahy Scale: Fair                             ADL either performed or assessed with clinical judgement   ADL Overall ADL's : At baseline                                       General ADL Comments: patient was able to complete LB dressing tasks sitting on edge of bed with SUP. patient noted to be quick to fatigue but O2 saturation within normal limits.patient set to d/c home with family support 24/7 at time of d/c. patient was educated on ECT and prioritzing needs. patient verbalized understanding.  patient completed toileting tasks with no AD with MI on this date with increased time.     Vision Baseline Vision/History: 1 Wears glasses;6 Macular Degeneration Ability to See in Adequate Light: 2 Moderately impaired Patient Visual Report: No change from baseline       Perception     Praxis      Pertinent Vitals/Pain Pain Assessment: No/denies pain     Hand Dominance Right   Extremity/Trunk Assessment Upper Extremity Assessment Upper Extremity Assessment: Overall WFL for tasks  assessed   Lower Extremity Assessment Lower Extremity Assessment: Defer to PT evaluation   Cervical / Trunk Assessment Cervical / Trunk Assessment: Normal   Communication Communication Communication: HOH   Cognition Arousal/Alertness: Awake/alert Behavior During Therapy: WFL for tasks assessed/performed Overall Cognitive Status: Within Functional Limits for tasks assessed                                       General Comments       Exercises     Shoulder Instructions      Home Living Family/patient expects to be discharged to:: Private residence Living Arrangements: Children Available Help at Discharge: Family;Available PRN/intermittently Type of Home: House Home Access: Ramped entrance     Home Layout: Able to live on main level with bedroom/bathroom     Bathroom Shower/Tub: Tub/shower unit   Bathroom Toilet: Handicapped height     Home Equipment: Conservation officer, nature (2 wheels);Cane - single point          Prior Functioning/Environment Prior Level of Function : Independent/Modified Independent             Mobility Comments: pt reports being very active typically ADLs Comments: daughter to transition home with patient        OT Problem List: Decreased activity tolerance      OT Treatment/Interventions: Self-care/ADL training;Therapeutic exercise;DME and/or AE instruction;Therapeutic activities;Balance training;Patient/family education    OT Goals(Current goals can be found in the care plan section) Acute Rehab OT Goals Patient Stated Goal: to go home today OT Goal Formulation: With patient Time For Goal Achievement: 02/24/21 Potential to Achieve Goals: Good  OT Frequency: Min 1X/week   Barriers to D/C:            Co-evaluation              AM-PAC OT "6 Clicks" Daily Activity     Outcome Measure Help from another person eating meals?: None Help from another person taking care of personal grooming?: None Help from another  person toileting, which includes using toliet, bedpan, or urinal?: None Help from another person bathing (including washing, rinsing, drying)?: A Little Help from another person to put on and taking off regular upper body clothing?: None Help from another person to put on and taking off regular lower body clothing?: None 6 Click Score: 23   End of Session Equipment Utilized During Treatment: Gait belt Nurse Communication: Mobility status  Activity Tolerance: Patient tolerated treatment well Patient left: in bed;with call bell/phone within reach  OT Visit Diagnosis: Unsteadiness on feet (R26.81)                Time: 6440-3474 OT Time Calculation (min): 18 min Charges:  OT General Charges $OT Visit: 1 Visit  Jackelyn Poling OTR/L, [facility] Acute Rehabilitation Department Office# [phone] Pager# [phone]   Marcellina Millin 02/10/2021, 11:34 AM

## 2021-02-17 ENCOUNTER — Other Ambulatory Visit: Payer: Self-pay

## 2021-02-17 ENCOUNTER — Encounter: Payer: Self-pay | Admitting: Adult Health

## 2021-02-17 ENCOUNTER — Ambulatory Visit (INDEPENDENT_AMBULATORY_CARE_PROVIDER_SITE_OTHER): Payer: Medicare Other | Admitting: Adult Health

## 2021-02-17 DIAGNOSIS — U071 COVID-19: Secondary | ICD-10-CM

## 2021-02-17 DIAGNOSIS — J1282 Pneumonia due to coronavirus disease 2019: Secondary | ICD-10-CM | POA: Insufficient documentation

## 2021-02-17 DIAGNOSIS — I10 Essential (primary) hypertension: Secondary | ICD-10-CM | POA: Diagnosis not present

## 2021-02-17 DIAGNOSIS — R300 Dysuria: Secondary | ICD-10-CM | POA: Diagnosis not present

## 2021-02-17 DIAGNOSIS — K922 Gastrointestinal hemorrhage, unspecified: Secondary | ICD-10-CM | POA: Diagnosis not present

## 2021-02-17 DIAGNOSIS — J449 Chronic obstructive pulmonary disease, unspecified: Secondary | ICD-10-CM | POA: Diagnosis not present

## 2021-02-17 DIAGNOSIS — J479 Bronchiectasis, uncomplicated: Secondary | ICD-10-CM | POA: Diagnosis not present

## 2021-02-17 NOTE — Assessment & Plan Note (Signed)
Recent flare with Covid infection/pneumonia now improving   Plan  Patient Instructions  Follow up with Dr. Fuller Plan as discussed for bloody stools.  Follow up with Dr. Virgina Jock today as planned  Continue on Stiolto 2 puffs daily  Flutter valve Three times a day   Albuterol neb or inhaler .As needed   Fluids and rest  Activity as tolerated.  Follow up with Dr. Melvyn Novas or Kjell Brannen NP   in 2  weeks with chest xray and As needed   Please contact office for sooner follow up if symptoms do not improve or worsen or seek emergency care

## 2021-02-17 NOTE — Patient Instructions (Addendum)
Follow up with Dr. Fuller Plan as discussed for bloody stools.  Follow up with Dr. Virgina Jock today as planned  Continue on Stiolto 2 puffs daily  Flutter valve Three times a day   Albuterol neb or inhaler .As needed   Fluids and rest  Activity as tolerated.  Follow up with Dr. Melvyn Novas or Bijan Ridgley NP   in 2  weeks with chest xray and As needed   Please contact office for sooner follow up if symptoms do not improve or worsen or seek emergency care

## 2021-02-17 NOTE — Assessment & Plan Note (Signed)
Blood stools during recent hospitalization - has improved Heidi Hobbs since discharge  Need GI follow up

## 2021-02-17 NOTE — Assessment & Plan Note (Signed)
Covid infection with pneumonia -clinically improved  Chest xray on return ov in 2 weeks   Plan  . Patient Instructions  Follow up with Dr. Fuller Plan as discussed for bloody stools.  Follow up with Dr. Virgina Jock today as planned  Continue on Stiolto 2 puffs daily  Flutter valve Three times a day   Albuterol neb or inhaler .As needed   Fluids and rest  Activity as tolerated.  Follow up with Dr. Melvyn Novas or Emmitt Matthews NP   in 2  weeks with chest xray and As needed   Please contact office for sooner follow up if symptoms do not improve or worsen or seek emergency care

## 2021-02-17 NOTE — Progress Notes (Signed)
_0  ID: Heidi Hobbs, female    DOB: 1940/09/04, 80 y.o.   MRN: [id number]  Chief Complaint  Patient presents with   Hospitalization Follow-up    Referring provider: Shon Baton, MD  HPI: 80 year old female former smoker followed for COPD and bronchiectasis medical history significant for history of polymyalgia rheumatica previously on steroids, previous exposure to chlorine gas in 1980s, previously on Macrodantin for UTIs stopped in 2019   TEST/EVENTS :  12/20/2018-ANA-negative 12/20/2018-sed rate-22 1014/2020-CBC with differential-eosinophils absolute 0.1, eosinophils relative 1 11/27/2018-chest x-ray-emphysema, no acute disease   08/31/2017-pulmonary function test-FVC 2.95 (95% predicted), postbronchodilator ratio 68, postbronchodilator FEV1 2.23 (95% predicted) no bronchodilator response, mid flow reversibility, DLCO 16.75 (57% predicted)  10/14/2017-CT chest high-res-moderate centrilobular emphysema and diffuse bronchial wall thickening, no acute consolidative airspace disease or lung masses, scattered mild cylindrical varicoid bronchiectasis throughout both lungs predominantly in bilateral upper lobes and right middle lobe with the associated mild patchy tree-in-bud opacities and scattered mucoid impaction, 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  02/17/2021 Follow up : COPD and Bronchiectasis Patient presents for a post hospital follow-up visit.  Patient was recently admitted last week for ASTMH-96 infection complicated by COVID-pneumonia and bloody stools.  She tested positive 02/01/21. Chest x-ray showed a left lower lobe opacity.  Patient was treated with  empiric antibiotics briefly.  She was treated with remdesivir.  She was seen by gastroenterology.  Hemoglobin was stable.  Was recommended to have outpatient GI evaluation. Since discharge patient is feeling better with decreased cough and congestion. Remains weak with low energy. Bloody stools have resolved. Eating and drinking well. Does have some lingering confusion/delayed thinking that is worse at night . Also has intermittent dizziness and malaise.  She was vaccinated for Covid x 2 with no booster.  Patient was seen in October and started on Stiolto but unclear if it has helped or not with breathing . PFT showed Mild COPD .   Allergies  Allergen Reactions   Eggs Or Egg-Derived Products     Skin nodules    Immunization History  Administered Date(s) Administered   Influenza Split 02/27/2010, 03/15/2010, 12/28/2011, 05/29/2013, 12/04/2013   Influenza Whole 12/14/2011, 12/13/2016   Influenza, High Dose Seasonal PF 11/21/2017   Influenza, Quadrivalent, Recombinant, Inj, Pf 11/18/2017   Influenza,inj,Quad PF,6+ Mos 01/02/2015   PFIZER(Purple Top)SARS-COV-2 Vaccination 04/23/2019, 05/21/2019   Pneumococcal Conjugate-13 06/27/2014   Pneumococcal Polysaccharide-23 09/03/2002, 05/29/2013   Tdap 03/28/2009, 10/16/2011    Past Medical History:  Diagnosis Date   Adenomatous colon polyp 1995, 2011   Anemia    Anxiety    Bronchiectasis ([place])    Cancer ([place])    uterine cancer   Carotid stenosis    40-59% right, less than 40% left   Cataracts, bilateral    COPD (chronic obstructive pulmonary disease) ([place])    Dyspnea    Essential hypertension 02/13/2016   Hyperlipidemia    Hypertension    MAC (mycobacterium avium-intracellulare complex)    Macular degeneration, dry 02/13/2016   Polio 1959   Stomach ulcer 1960   Urinary tract infection    Vasculitis ([place])    Normocomplementemic urticarial    Vasculitis ([place]) 02/13/2016   Followed by [facility]  History: Social History   Tobacco Use  Smoking Status Former   Packs/day: 0.50   Years: 50.00   Pack years: 25.00   Types: Cigarettes   Quit date: 08/01/2017   Years since quitting: 3.5  Smokeless Tobacco Never   Counseling given: Not Answered   Outpatient Medications Prior to Visit  Medication Sig Dispense Refill   albuterol (VENTOLIN HFA) 108 (90 Base) MCG/ACT inhaler Inhale 2 puffs into the lungs every 4 (four) hours as needed for wheezing or shortness of breath. 18 g 2   ALPRAZolam (XANAX) 0.25 MG tablet Take 0.25 tablets by mouth daily as needed for anxiety.   1   amitriptyline (ELAVIL) 100 MG tablet Take 100 mg by mouth at bedtime.      ascorbic acid (VITAMIN C) 500 MG tablet Take 1 tablet (500 mg total) by mouth daily for 7 days. 7 tablet 0   atenolol (TENORMIN) 50 MG tablet Take 50 mg by mouth daily.     atorvastatin (LIPITOR) 40 MG tablet Take 40 mg by mouth every other day. At night     cholecalciferol (VITAMIN D3) 25 MCG (1000 UT) tablet Take 1,000 Units by mouth daily.     docusate sodium (COLACE) 100 MG capsule You can buy this at any drug store, over the counter and follow package instructions (Patient taking differently: Take 100 mg by mouth at bedtime.) 10 capsule 0   ezetimibe (ZETIA) 10 MG tablet Take 10 mg by mouth at bedtime.      hydrALAZINE (APRESOLINE) 25 MG tablet Take 25 mg by mouth 2 (two) times daily.     hydrochlorothiazide (HYDRODIURIL) 25 MG tablet Take 25 mg by mouth daily.     losartan (COZAAR) 100 MG tablet Take 100 mg by mouth daily.     omeprazole (PRILOSEC OTC) 20 MG tablet Take 1 tablet (20 mg total) by mouth daily before breakfast. (Patient taking differently: Take 20 mg by mouth daily as needed (for acid reflux).) 30 tablet 3   polyethylene glycol (MIRALAX / GLYCOLAX) 17 g packet Take 17 g by mouth daily. 14 each 0   Respiratory Therapy Supplies (FLUTTER) DEVI 1 Device by Does not apply route 2 (two) times daily. 1 each 0   Tiotropium  Bromide-Olodaterol (STIOLTO RESPIMAT) 2.5-2.5 MCG/ACT AERS Inhale 2 puffs into the lungs daily. 1 each 5   traMADol (ULTRAM) 50 MG tablet Take 50 mg by mouth at bedtime as needed for moderate pain.     zinc sulfate 220 (50 Zn) MG capsule Take 1 capsule (220 mg total) by mouth daily for 7 days. (Patient not taking: Reported on 02/17/2021) 7 capsule 0   Facility-Administered Medications Prior to Visit  Medication Dose Route Frequency Provider Last Rate Last Admin   diclofenac sodium (VOLTAREN) 1 % transdermal gel 4 g  4 g Topical QID [name], MD         Review of Systems:   Constitutional:   No  weight loss, night sweats,  Fevers, chills,  +fatigue, or  lassitude.  HEENT:   No headaches,  Difficulty swallowing,  Tooth/dental problems, or  Sore throat,                No sneezing, itching, ear ache, nasal congestion, post nasal drip,   CV:  No chest pain,  Orthopnea, PND, swelling in lower extremities, anasarca, dizziness, palpitations, syncope.   GI  No heartburn, indigestion, abdominal pain, nausea, vomiting, diarrhea, change in bowel habits, loss of appetite, bloody stools.  Resp:  No chest wall deformity  Skin: no rash or lesions.  GU: no dysuria, change in color of urine, no urgency or frequency.  No flank pain, no hematuria   MS:  No joint pain or swelling.  No decreased range of motion.  No back pain.    Physical Exam  BP 140/60 (BP Location: Left Arm, Patient Position: Sitting, Cuff Size: Normal)   Pulse 81   Temp 98 F (36.7 C) (Oral)   Ht _0  (1.727 m)   Wt 140 lb 9.6 oz (63.8 kg)   SpO2 96%   BMI 21.38 kg/m   GEN: A/Ox3; pleasant , NAD, well nourished , [place]    HEENT:  [place]/AT,   NOSE-clear, THROAT-clear, no lesions, no postnasal drip or exudate noted.   NECK:  Supple w/ fair ROM; no JVD; normal carotid impulses w/o bruits; no thyromegaly or nodules palpated; no lymphadenopathy.    RESP  Clear  P & A; w/o, wheezes/ rales/ or rhonchi. no accessory  muscle use, no dullness to percussion  CARD:  RRR, no m/r/g, no peripheral edema, pulses intact, no cyanosis or clubbing.  GI:   Soft & nt; nml bowel sounds; no organomegaly or masses detected.   Musco: Warm bil, no deformities or joint swelling noted.   Neuro: alert, no focal deficits noted.    Skin: Warm, no lesions or rashes    Lab Results:  CBC    Component Value Date/Time   WBC 12.7 (H) 02/10/2021 0443   RBC 3.39 (L) 02/10/2021 0443   HGB 10.2 (L) 02/10/2021 0443   HCT 30.0 (L) 02/10/2021 0443   PLT 324 02/10/2021 0443   MCV 88.5 02/10/2021 0443   MCH 30.1 02/10/2021 0443   MCHC 34.0 02/10/2021 0443   RDW 14.8 02/10/2021 0443   LYMPHSABS 3.0 02/10/2021 0443   MONOABS 1.3 (H) 02/10/2021 0443   EOSABS 0.0 02/10/2021 0443   BASOSABS 0.0 02/10/2021 0443    BMET    Component Value Date/Time   NA 135 02/10/2021 0443   K 4.5 02/10/2021 0443   CL 107 02/10/2021 0443   CO2 22 02/10/2021 0443   GLUCOSE 103 (H) 02/10/2021 0443   BUN 14 02/10/2021 0443   CREATININE 0.51 02/10/2021 0443   CALCIUM 8.2 (L) 02/10/2021 0443   GFRNONAA >60 02/10/2021 0443   GFRAA >60 08/14/2019 1500    BNP    Component Value Date/Time   BNP 72.6 08/06/2019 1334    ProBNP No results found for: PROBNP  Imaging: DG Chest Port 1 View  Result Date: 02/08/2021 CLINICAL DATA:  Weakness, hypoxia, COVID positive. EXAM: PORTABLE CHEST 1 VIEW COMPARISON:  10/06/2020. FINDINGS: The heart size and mediastinal contours are within normal limits. Atherosclerotic calcification of the aorta is noted. Mild emphysematous changes are present in the lungs. Apical pleural scarring is noted bilaterally. Mild airspace disease is noted at the left lung base. No effusion or pneumothorax is seen. Cervical spinal fusion hardware is noted. No acute osseous abnormality. IMPRESSION: 1. Mild airspace disease at the left lung base, possible atelectasis or infiltrate. 2. Chronic obstructive pulmonary disease.  Electronically Signed   By: Brett Fairy M.D.   On: 02/08/2021 04:27      PFT Results Latest Ref Rng & Units 01/06/2021 08/31/2017  FVC-Pre L 2.54 2.95  FVC-Predicted Pre % 85 95  FVC-Post L 2.84 3.27  FVC-Predicted Post % 95 105  Pre FEV1/FVC % % 57 71  Post FEV1/FCV % % 57 68  FEV1-Pre L 1.45 2.09  FEV1-Predicted Pre % 65 90  FEV1-Post L 1.61 2.23  DLCO uncorrected ml/min/mmHg 11.89 16.75  DLCO UNC% % 56 57  DLCO corrected ml/min/mmHg 11.89 -  DLCO COR %Predicted % 56 -  DLVA Predicted % 68 66  TLC L 6.30 6.53  TLC % Predicted % 113 117  RV % Predicted % 138 136    No results found for: NITRICOXIDE      Assessment & Plan:   COPD GOLD 1 Recent flare with Covid infection/pneumonia now improving   Plan  Patient Instructions  Follow up with Dr. Fuller Plan as discussed for bloody stools.  Follow up with Dr. Virgina Jock today as planned  Continue on Stiolto 2 puffs daily  Flutter valve Three times a day   Albuterol neb or inhaler .As needed   Fluids and rest  Activity as tolerated.  Follow up with Dr. Melvyn Novas or Stephaie Dardis NP   in 2  weeks with chest xray and As needed   Please contact office for sooner follow up if symptoms do not improve or worsen or seek emergency care            Acute GI bleeding Blood stools during recent hospitalization - has improved Vonzell Schlatter since discharge  Need GI follow up    Pneumonia due to COVID-19 virus Covid infection with pneumonia -clinically improved  Chest xray on return ov in 2 weeks   Plan  . Patient Instructions  Follow up with Dr. Fuller Plan as discussed for bloody stools.  Follow up with Dr. Virgina Jock today as planned  Continue on Stiolto 2 puffs daily  Flutter valve Three times a day   Albuterol neb or inhaler .As needed   Fluids and rest  Activity as tolerated.  Follow up with Dr. Melvyn Novas or Orine Goga NP   in 2  weeks with chest xray and As needed   Please contact office for sooner follow up if symptoms do not improve or worsen or  seek emergency care           I spent   40 minutes dedicated to the care of this patient on the date of this encounter to include pre-visit review of records, face-to-face time with the patient discussing conditions above, post visit ordering of testing, clinical documentation with the electronic health record, making appropriate referrals as documented, and communicating necessary findings to members of the patients care team.     Rexene Edison, NP 02/17/2021

## 2021-02-26 DIAGNOSIS — H353231 Exudative age-related macular degeneration, bilateral, with active choroidal neovascularization: Secondary | ICD-10-CM | POA: Diagnosis not present

## 2021-02-26 DIAGNOSIS — Z961 Presence of intraocular lens: Secondary | ICD-10-CM | POA: Diagnosis not present

## 2021-02-26 DIAGNOSIS — H4312 Vitreous hemorrhage, left eye: Secondary | ICD-10-CM | POA: Diagnosis not present

## 2021-03-03 ENCOUNTER — Ambulatory Visit: Payer: Medicare Other | Admitting: Adult Health

## 2021-03-13 DIAGNOSIS — E785 Hyperlipidemia, unspecified: Secondary | ICD-10-CM | POA: Diagnosis not present

## 2021-03-13 DIAGNOSIS — I700 Atherosclerosis of aorta: Secondary | ICD-10-CM | POA: Diagnosis not present

## 2021-03-13 DIAGNOSIS — I10 Essential (primary) hypertension: Secondary | ICD-10-CM | POA: Diagnosis not present

## 2021-03-13 DIAGNOSIS — N182 Chronic kidney disease, stage 2 (mild): Secondary | ICD-10-CM | POA: Diagnosis not present

## 2021-03-15 IMAGING — DX DG CHEST 2V
2 series · 2 of 2 positions shown · non-contrast
Comparison: June 08, 2019

CLINICAL DATA: Shortness of breath

EXAM:
CHEST - 2 VIEW

[chest pa]
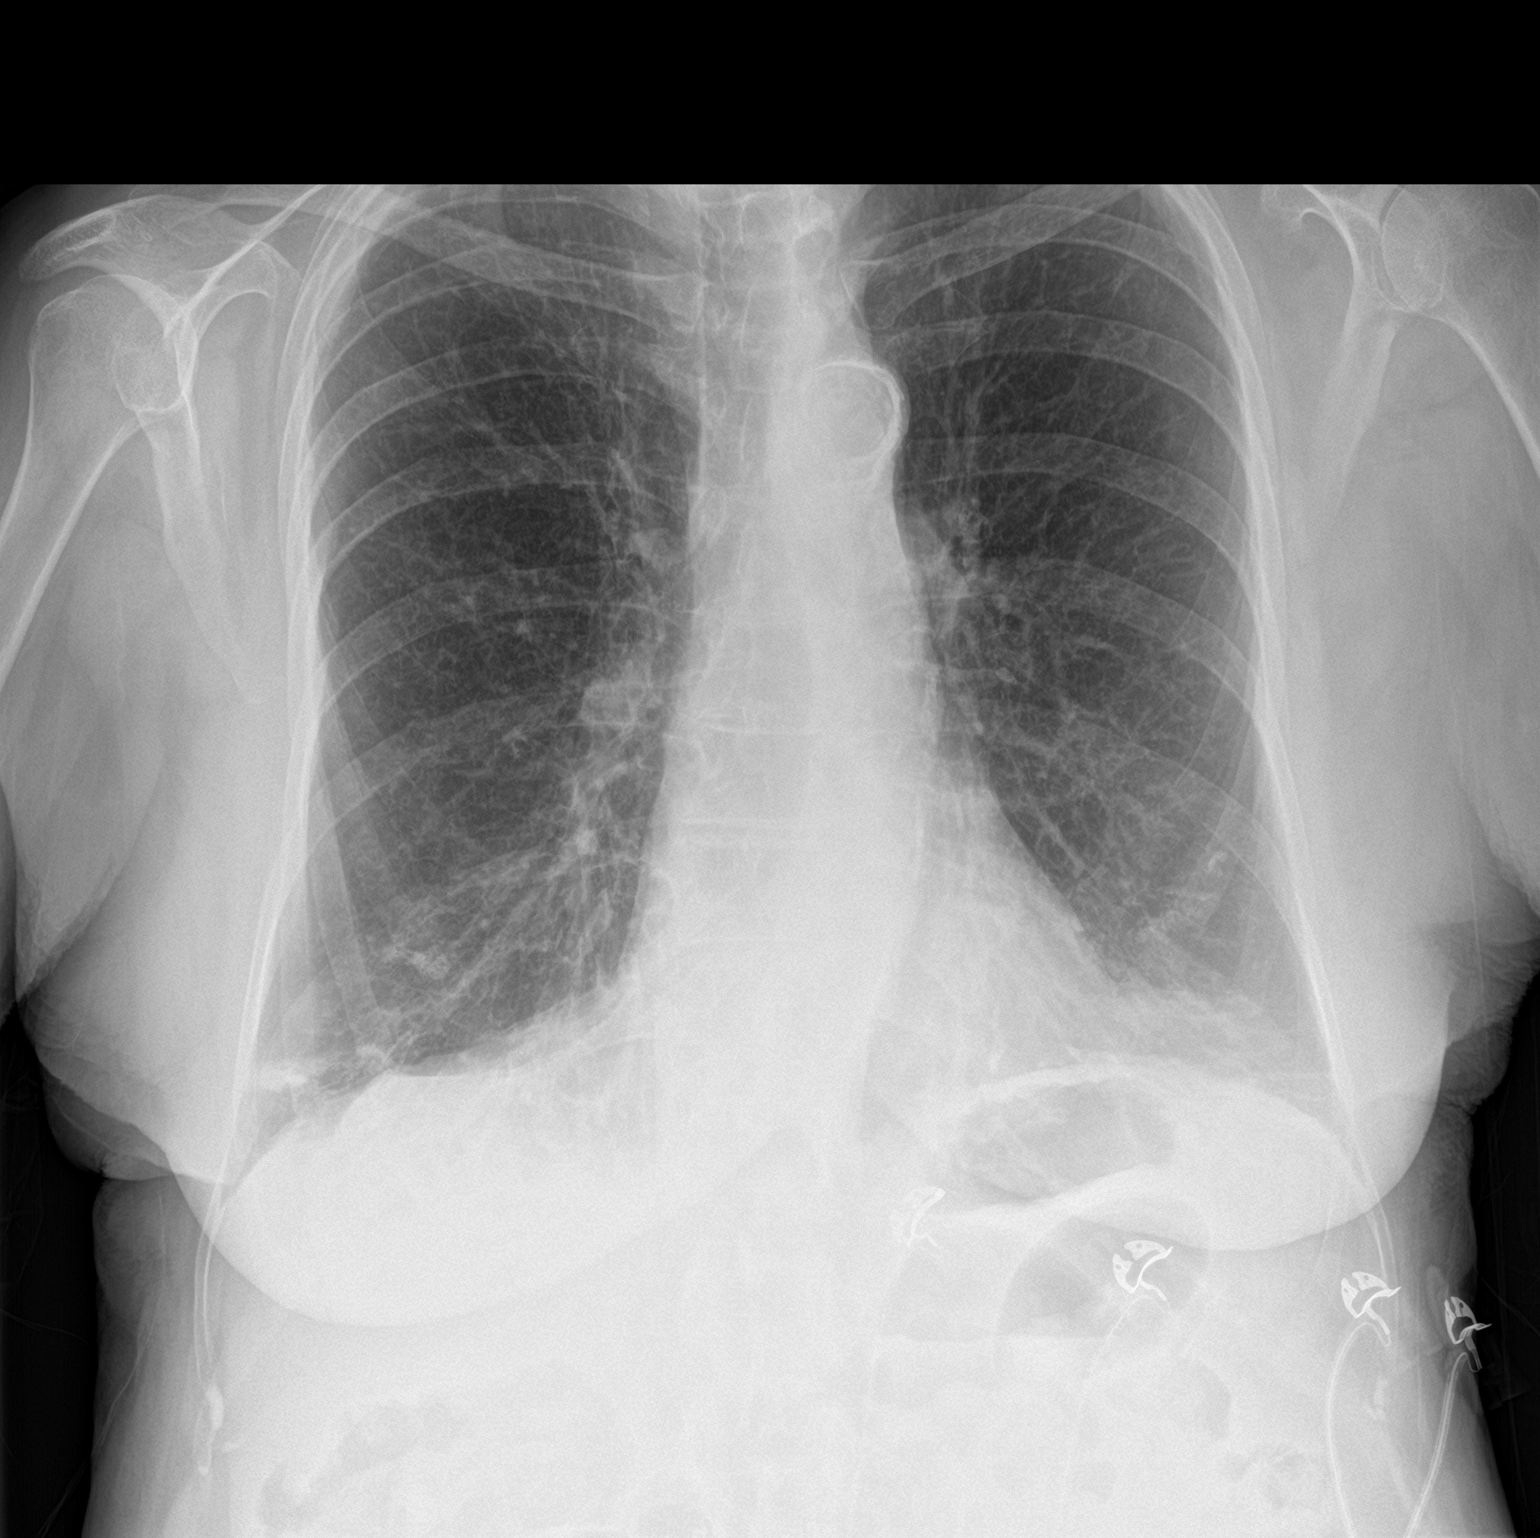

[chest lat]
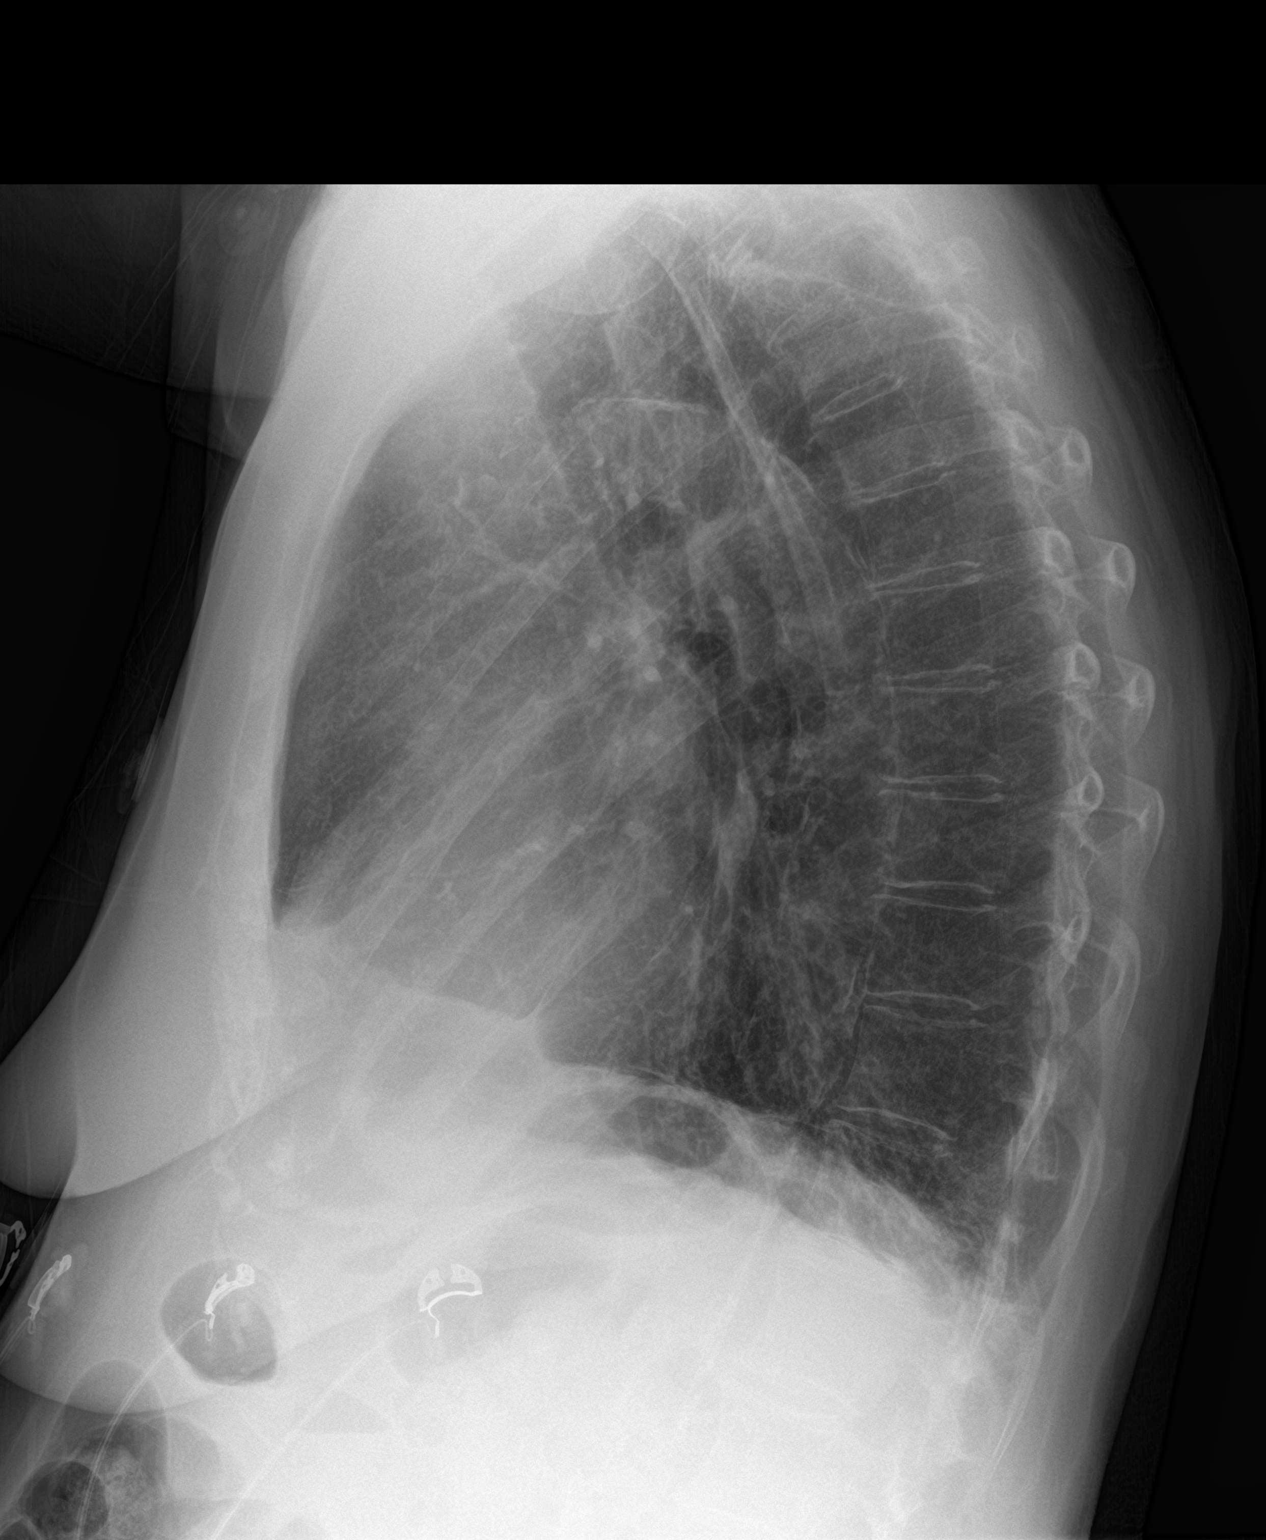

[2 of 2 positions shown; findings below may reference images not displayed]

FINDINGS: There is atelectatic change in the lung bases. The lungs otherwise
are clear. Heart size and pulmonary vascularity are normal. There is
aortic atherosclerosis. No adenopathy. There is postoperative change
in the lower cervical region.
IMPRESSION: Bibasilar atelectasis. Lungs otherwise clear. Cardiac silhouette
normal. Aortic Atherosclerosis (MCTAP-Q20.0).

## 2021-04-16 DIAGNOSIS — D519 Vitamin B12 deficiency anemia, unspecified: Secondary | ICD-10-CM | POA: Diagnosis not present

## 2021-04-16 DIAGNOSIS — H4312 Vitreous hemorrhage, left eye: Secondary | ICD-10-CM | POA: Diagnosis not present

## 2021-04-16 DIAGNOSIS — H04123 Dry eye syndrome of bilateral lacrimal glands: Secondary | ICD-10-CM | POA: Diagnosis not present

## 2021-04-16 DIAGNOSIS — Z961 Presence of intraocular lens: Secondary | ICD-10-CM | POA: Diagnosis not present

## 2021-04-16 DIAGNOSIS — J449 Chronic obstructive pulmonary disease, unspecified: Secondary | ICD-10-CM | POA: Diagnosis not present

## 2021-04-16 DIAGNOSIS — I10 Essential (primary) hypertension: Secondary | ICD-10-CM | POA: Diagnosis not present

## 2021-04-16 DIAGNOSIS — H353231 Exudative age-related macular degeneration, bilateral, with active choroidal neovascularization: Secondary | ICD-10-CM | POA: Diagnosis not present

## 2021-04-16 DIAGNOSIS — I2721 Secondary pulmonary arterial hypertension: Secondary | ICD-10-CM | POA: Diagnosis not present

## 2021-04-16 DIAGNOSIS — N182 Chronic kidney disease, stage 2 (mild): Secondary | ICD-10-CM | POA: Diagnosis not present

## 2021-04-16 DIAGNOSIS — D649 Anemia, unspecified: Secondary | ICD-10-CM | POA: Diagnosis not present

## 2021-05-14 DIAGNOSIS — Z961 Presence of intraocular lens: Secondary | ICD-10-CM | POA: Diagnosis not present

## 2021-05-14 DIAGNOSIS — Z8673 Personal history of transient ischemic attack (TIA), and cerebral infarction without residual deficits: Secondary | ICD-10-CM | POA: Diagnosis not present

## 2021-05-14 DIAGNOSIS — H353231 Exudative age-related macular degeneration, bilateral, with active choroidal neovascularization: Secondary | ICD-10-CM | POA: Diagnosis not present

## 2021-05-14 DIAGNOSIS — H04123 Dry eye syndrome of bilateral lacrimal glands: Secondary | ICD-10-CM | POA: Diagnosis not present

## 2021-05-21 ENCOUNTER — Ambulatory Visit: Payer: Medicare Other | Admitting: Gastroenterology

## 2021-05-28 DIAGNOSIS — H353231 Exudative age-related macular degeneration, bilateral, with active choroidal neovascularization: Secondary | ICD-10-CM | POA: Diagnosis not present

## 2021-05-28 DIAGNOSIS — H4312 Vitreous hemorrhage, left eye: Secondary | ICD-10-CM | POA: Diagnosis not present

## 2021-05-28 DIAGNOSIS — H04123 Dry eye syndrome of bilateral lacrimal glands: Secondary | ICD-10-CM | POA: Diagnosis not present

## 2021-05-28 DIAGNOSIS — Z961 Presence of intraocular lens: Secondary | ICD-10-CM | POA: Diagnosis not present

## 2021-06-09 ENCOUNTER — Encounter: Payer: Self-pay | Admitting: Gastroenterology

## 2021-06-09 ENCOUNTER — Ambulatory Visit (INDEPENDENT_AMBULATORY_CARE_PROVIDER_SITE_OTHER): Payer: Medicare Other | Admitting: Gastroenterology

## 2021-06-09 ENCOUNTER — Other Ambulatory Visit (INDEPENDENT_AMBULATORY_CARE_PROVIDER_SITE_OTHER): Payer: Medicare Other

## 2021-06-09 VITALS — BP 158/64 | HR 96 | Ht 67.0 in | Wt 139.0 lb

## 2021-06-09 DIAGNOSIS — Z86010 Personal history of colonic polyps: Secondary | ICD-10-CM

## 2021-06-09 DIAGNOSIS — R1030 Lower abdominal pain, unspecified: Secondary | ICD-10-CM

## 2021-06-09 DIAGNOSIS — R102 Pelvic and perineal pain: Secondary | ICD-10-CM

## 2021-06-09 DIAGNOSIS — R198 Other specified symptoms and signs involving the digestive system and abdomen: Secondary | ICD-10-CM | POA: Diagnosis not present

## 2021-06-09 LAB — CREATININE, SERUM: Creatinine, Ser: 0.61 mg/dL (ref 0.40–1.20)

## 2021-06-09 LAB — BUN: BUN: 5 mg/dL — ABNORMAL LOW (ref 6–23)

## 2021-06-09 NOTE — Patient Instructions (Signed)
We recommend you have your primary care physician  refer you to a GYN physician.  ? ?Your provider has requested that you go to the basement level for lab work before leaving today. Press "B" on the elevator. The lab is located at the first door on the left as you exit the elevator. ? ?You have been scheduled for a CT scan of the abdomen and pelvis at [facility] ([address]---this is in the same building as [facility]).  ? ?You are scheduled on 06/17/21 at 1:30pm. You should arrive 15 minutes prior to your appointment time for registration. Please follow the written instructions below on the day of your exam: ? ?WARNING: IF YOU ARE ALLERGIC TO IODINE/X-RAY DYE, PLEASE NOTIFY RADIOLOGY IMMEDIATELY AT [phone]! YOU WILL BE GIVEN A 13 HOUR PREMEDICATION PREP. ? ?1) Do not eat or drink anything after 9:30am (4 hours prior to your test) ?2) You have been given 2 bottles of oral contrast to drink. The solution may taste better if refrigerated, but do NOT add ice or any other liquid to this solution. Shake well before drinking. ?  ? Drink 1 bottle of contrast @ 11:30 am (2 hours prior to your exam) ? Drink 1 bottle of contrast @ 12:30 pm (1 hour prior to your exam) ? ?You may take any medications as prescribed with a small amount of water, if necessary. If you take any of the following medications: METFORMIN, GLUCOPHAGE, GLUCOVANCE, AVANDAMET, RIOMET, FORTAMET, ACTOPLUS MET, JANUMET, [place] or METAGLIP, you MAY be asked to HOLD this medication 48 hours AFTER the exam. ? ?The purpose of you drinking the oral contrast is to aid in the visualization of your intestinal tract. The contrast solution may cause some diarrhea. Depending on your individual set of symptoms, you may also receive an intravenous injection of x-ray contrast/dye. Plan on being at [facility] for 30 minutes or longer, depending on the type of exam you are having performed. ? ?This test typically takes  30-45 minutes to complete. ? ?If you have any questions regarding your exam or if you need to reschedule, you may call the CT department at [phone] between the hours of 8:00 am and 5:00 pm, Monday-Friday. ? ?___________________________________________________________ ? ?Due to recent changes in healthcare laws, you may see the results of your imaging and laboratory studies on MyChart before your provider has had a chance to review them.  We understand that in some cases there may be results that are confusing or concerning to you. Not all laboratory results come back in the same time frame and the provider may be waiting for multiple results in order to interpret others.  Please give [place] 48 hours in order for your provider to thoroughly review all the results before contacting the office for clarification of your results.  ? ?The [facility] providers would like to encourage you to use [facility] to communicate with providers for non-urgent requests or questions.  Due to long hold times on the telephone, sending your provider a message by [facility] may be a faster and more efficient way to get a response.  Please allow 48 business hours for a response.  Please remember that this is for non-urgent requests.  ? ?Thank you for choosing me and [facility]. ? ?Malcolm T. Dagoberto Ligas., MD., [facility]al ? ? ?

## 2021-06-09 NOTE — Progress Notes (Signed)
? ? ?  History of Present Illness: This is an 81 year old female with a change in bowel habits.  At her last visit in June 12, 2020 she had chronic constipation alternating with diarrhea, pelvic pain and lower abdominal pain.  Her pain was exacerbated by constipation.  GYN evaluation was recommended.  Colonoscopy was discussed in May 2020 and again in March 2022. She declined due to her age and comorbidities which was reasonable.  She was hospitalized in November 2022 with small volume rectal bleeding felt secondary to diverticulosis or internal hemorrhoids and with COVID-19 with pneumonia.  Her last colonoscopy was performed in February 2013 showing left colon diverticulosis and 1 small adenomatous colon polyp.  She has not noted any further bleeding but constipation alternating with diarrhea associated with lower abdominal pain, pelvic pain and perineal pressure has been a ongoing problem. ? ?CT AP 07/03/2018 ?1. No acute abnormalities involving the abdomen or pelvis. ?2. Possible laxity of the pelvic floor musculature. ?3. Severe descending and sigmoid colon diverticulosis without evidence of acute diverticulitis. ?4. Diffuse hepatic steatosis with focal areas of sparing in the periphery of the liver. ?5. Small hiatal hernia. ?6. Duodenal diverticula. ?7. Stable bilateral adrenal adenomas dating back to 2013. ?8.  Aortic Atherosclerosis, severe. ? ? ?Current Medications, Allergies, Past Medical History, Past Surgical History, Family History and Social History were reviewed in [employer] record. ? ? ?Physical Exam: ?General: Well developed, well nourished, frail, elderly, no acute distress ?Head: Normocephalic and atraumatic ?Eyes: Sclerae anicteric, EOMI ?Ears: Normal auditory acuity ?Mouth: Not examined, mask on during Covid-19 pandemic ?Lungs: Clear throughout to auscultation ?Heart: Regular rate and rhythm; no murmurs, rubs or bruits ?Abdomen: Soft, non tender and non distended. No  masses, hepatosplenomegaly or hernias noted. Normal Bowel sounds ?Rectal: Not done  ?Musculoskeletal: Symmetrical with no gross deformities  ?Pulses:  Normal pulses noted ?Extremities: No clubbing, cyanosis, edema or deformities noted ?Neurological: Alert oriented x 4, grossly nonfocal ?Psychological:  Alert and cooperative. Normal mood and affect ? ? ?Assessment and Recommendations: ? ?Lower abdominal pain, pelvic pain, constipation alternating with diarrhea, personal history of adenomatous colon polyps, family history of colon cancer and self-limited small-volume hematochezia that occurred in November and has not recurred.  Continue MiraLAX with titrated dose for complete bowel movement each day.  Continue Colace daily.  We discussed GYN referral to evaluate for rectocele and CT of the abdomen and pelvis and she agrees to both.  We discussed colonoscopy.  She is higher risk for colonoscopy due to her age and comorbidities.  She declines colonoscopy at this time however she will reconsider if her symptoms worsen.  Return to PCP for ongoing care. ?

## 2021-06-17 ENCOUNTER — Other Ambulatory Visit: Payer: Medicare Other

## 2021-06-24 ENCOUNTER — Ambulatory Visit (INDEPENDENT_AMBULATORY_CARE_PROVIDER_SITE_OTHER)
Admission: RE | Admit: 2021-06-24 | Discharge: 2021-06-24 | Disposition: A | Discharge status: Home / Self Care | Payer: Medicare Other | Source: Ambulatory Visit | Attending: Gastroenterology | Admitting: Gastroenterology

## 2021-06-24 DIAGNOSIS — R198 Other specified symptoms and signs involving the digestive system and abdomen: Secondary | ICD-10-CM

## 2021-06-24 DIAGNOSIS — R102 Pelvic and perineal pain: Secondary | ICD-10-CM

## 2021-06-24 DIAGNOSIS — K5730 Diverticulosis of large intestine without perforation or abscess without bleeding: Secondary | ICD-10-CM | POA: Diagnosis not present

## 2021-06-24 DIAGNOSIS — R1030 Lower abdominal pain, unspecified: Secondary | ICD-10-CM

## 2021-06-24 DIAGNOSIS — K625 Hemorrhage of anus and rectum: Secondary | ICD-10-CM | POA: Diagnosis not present

## 2021-06-24 MED ORDER — IOHEXOL 300 MG/ML  SOLN
80.0000 mL | Freq: Once | INTRAMUSCULAR | Status: AC | PRN
  Administered 2021-06-24: 80 mL via INTRAVENOUS

## 2021-07-16 DIAGNOSIS — H353230 Exudative age-related macular degeneration, bilateral, stage unspecified: Secondary | ICD-10-CM | POA: Diagnosis not present

## 2021-07-16 DIAGNOSIS — M316 Other giant cell arteritis: Secondary | ICD-10-CM | POA: Diagnosis not present

## 2021-07-16 DIAGNOSIS — H04123 Dry eye syndrome of bilateral lacrimal glands: Secondary | ICD-10-CM | POA: Diagnosis not present

## 2021-07-16 DIAGNOSIS — Z961 Presence of intraocular lens: Secondary | ICD-10-CM | POA: Diagnosis not present

## 2021-07-21 DIAGNOSIS — N390 Urinary tract infection, site not specified: Secondary | ICD-10-CM | POA: Diagnosis not present

## 2021-07-24 ENCOUNTER — Encounter (HOSPITAL_COMMUNITY): Payer: Self-pay

## 2021-07-24 ENCOUNTER — Inpatient Hospital Stay (HOSPITAL_COMMUNITY)
Admission: EM | Admit: 2021-07-24 | Discharge: 2021-07-27 | DRG: 191 | Disposition: A | Discharge status: Home / Self Care | Payer: Medicare Other | Attending: Family Medicine | Admitting: Family Medicine

## 2021-07-24 ENCOUNTER — Emergency Department (HOSPITAL_COMMUNITY): Payer: Medicare Other

## 2021-07-24 ENCOUNTER — Other Ambulatory Visit: Payer: Self-pay

## 2021-07-24 DIAGNOSIS — D3500 Benign neoplasm of unspecified adrenal gland: Secondary | ICD-10-CM | POA: Diagnosis not present

## 2021-07-24 DIAGNOSIS — Z8542 Personal history of malignant neoplasm of other parts of uterus: Secondary | ICD-10-CM | POA: Diagnosis not present

## 2021-07-24 DIAGNOSIS — Z87440 Personal history of urinary (tract) infections: Secondary | ICD-10-CM

## 2021-07-24 DIAGNOSIS — M353 Polymyalgia rheumatica: Secondary | ICD-10-CM | POA: Diagnosis not present

## 2021-07-24 DIAGNOSIS — Z87891 Personal history of nicotine dependence: Secondary | ICD-10-CM

## 2021-07-24 DIAGNOSIS — J471 Bronchiectasis with (acute) exacerbation: Secondary | ICD-10-CM | POA: Diagnosis not present

## 2021-07-24 DIAGNOSIS — E876 Hypokalemia: Secondary | ICD-10-CM | POA: Diagnosis not present

## 2021-07-24 DIAGNOSIS — N3289 Other specified disorders of bladder: Secondary | ICD-10-CM | POA: Diagnosis not present

## 2021-07-24 DIAGNOSIS — F419 Anxiety disorder, unspecified: Secondary | ICD-10-CM | POA: Diagnosis present

## 2021-07-24 DIAGNOSIS — R062 Wheezing: Secondary | ICD-10-CM | POA: Diagnosis not present

## 2021-07-24 DIAGNOSIS — J9809 Other diseases of bronchus, not elsewhere classified: Secondary | ICD-10-CM | POA: Diagnosis not present

## 2021-07-24 DIAGNOSIS — Z8616 Personal history of COVID-19: Secondary | ICD-10-CM | POA: Diagnosis not present

## 2021-07-24 DIAGNOSIS — J9601 Acute respiratory failure with hypoxia: Secondary | ICD-10-CM | POA: Diagnosis not present

## 2021-07-24 DIAGNOSIS — R338 Other retention of urine: Secondary | ICD-10-CM

## 2021-07-24 DIAGNOSIS — Z79899 Other long term (current) drug therapy: Secondary | ICD-10-CM | POA: Diagnosis not present

## 2021-07-24 DIAGNOSIS — K6389 Other specified diseases of intestine: Secondary | ICD-10-CM | POA: Diagnosis not present

## 2021-07-24 DIAGNOSIS — E785 Hyperlipidemia, unspecified: Secondary | ICD-10-CM | POA: Diagnosis not present

## 2021-07-24 DIAGNOSIS — I7789 Other specified disorders of arteries and arterioles: Secondary | ICD-10-CM | POA: Diagnosis not present

## 2021-07-24 DIAGNOSIS — Z981 Arthrodesis status: Secondary | ICD-10-CM

## 2021-07-24 DIAGNOSIS — R0602 Shortness of breath: Secondary | ICD-10-CM | POA: Diagnosis not present

## 2021-07-24 DIAGNOSIS — Z8701 Personal history of pneumonia (recurrent): Secondary | ICD-10-CM | POA: Diagnosis not present

## 2021-07-24 DIAGNOSIS — N816 Rectocele: Secondary | ICD-10-CM | POA: Diagnosis not present

## 2021-07-24 DIAGNOSIS — R059 Cough, unspecified: Secondary | ICD-10-CM | POA: Diagnosis not present

## 2021-07-24 DIAGNOSIS — I10 Essential (primary) hypertension: Secondary | ICD-10-CM | POA: Diagnosis not present

## 2021-07-24 DIAGNOSIS — Q433 Congenital malformations of intestinal fixation: Secondary | ICD-10-CM | POA: Diagnosis not present

## 2021-07-24 DIAGNOSIS — I2510 Atherosclerotic heart disease of native coronary artery without angina pectoris: Secondary | ICD-10-CM | POA: Diagnosis not present

## 2021-07-24 DIAGNOSIS — R0689 Other abnormalities of breathing: Secondary | ICD-10-CM | POA: Diagnosis not present

## 2021-07-24 DIAGNOSIS — N390 Urinary tract infection, site not specified: Secondary | ICD-10-CM | POA: Diagnosis not present

## 2021-07-24 DIAGNOSIS — H3530 Unspecified macular degeneration: Secondary | ICD-10-CM | POA: Diagnosis present

## 2021-07-24 DIAGNOSIS — Z825 Family history of asthma and other chronic lower respiratory diseases: Secondary | ICD-10-CM

## 2021-07-24 DIAGNOSIS — Z20822 Contact with and (suspected) exposure to covid-19: Secondary | ICD-10-CM | POA: Diagnosis present

## 2021-07-24 DIAGNOSIS — Z91012 Allergy to eggs: Secondary | ICD-10-CM | POA: Diagnosis not present

## 2021-07-24 DIAGNOSIS — R042 Hemoptysis: Secondary | ICD-10-CM | POA: Diagnosis not present

## 2021-07-24 LAB — RESP PANEL BY RT-PCR (FLU A&B, COVID) ARPGX2
Influenza A by PCR: NEGATIVE
Influenza B by PCR: NEGATIVE
SARS Coronavirus 2 by RT PCR: NEGATIVE

## 2021-07-24 LAB — CBC WITH DIFFERENTIAL/PLATELET
Abs Immature Granulocytes: 0.13 10*3/uL — ABNORMAL HIGH (ref 0.00–0.07)
Basophils Absolute: 0 10*3/uL (ref 0.0–0.1)
Basophils Relative: 0 %
Eosinophils Absolute: 0 10*3/uL (ref 0.0–0.5)
Eosinophils Relative: 0 %
HCT: 39.3 % (ref 36.0–46.0)
Hemoglobin: 12.9 g/dL (ref 12.0–15.0)
Immature Granulocytes: 1 %
Lymphocytes Relative: 5 %
Lymphs Abs: 0.9 10*3/uL (ref 0.7–4.0)
MCH: 28.2 pg (ref 26.0–34.0)
MCHC: 32.8 g/dL (ref 30.0–36.0)
MCV: 85.8 fL (ref 80.0–100.0)
Monocytes Absolute: 0.5 10*3/uL (ref 0.1–1.0)
Monocytes Relative: 3 %
Neutro Abs: 16.3 10*3/uL — ABNORMAL HIGH (ref 1.7–7.7)
Neutrophils Relative %: 91 %
Platelets: 315 10*3/uL (ref 150–400)
RBC: 4.58 MIL/uL (ref 3.87–5.11)
RDW: 17.2 % — ABNORMAL HIGH (ref 11.5–15.5)
WBC: 17.9 10*3/uL — ABNORMAL HIGH (ref 4.0–10.5)
nRBC: 0 % (ref 0.0–0.2)

## 2021-07-24 LAB — COMPREHENSIVE METABOLIC PANEL
ALT: 10 U/L (ref 0–44)
AST: 14 U/L — ABNORMAL LOW (ref 15–41)
Albumin: 3.6 g/dL (ref 3.5–5.0)
Alkaline Phosphatase: 67 U/L (ref 38–126)
Anion gap: 11 (ref 5–15)
BUN: 7 mg/dL — ABNORMAL LOW (ref 8–23)
CO2: 27 mmol/L (ref 22–32)
Calcium: 9.1 mg/dL (ref 8.9–10.3)
Chloride: 95 mmol/L — ABNORMAL LOW (ref 98–111)
Creatinine, Ser: 0.55 mg/dL (ref 0.44–1.00)
GFR, Estimated: >60 mL/min (ref >60)
Glucose, Bld: 140 mg/dL — ABNORMAL HIGH (ref 70–99)
Potassium: 3 mmol/L — ABNORMAL LOW (ref 3.5–5.1)
Sodium: 133 mmol/L — ABNORMAL LOW (ref 135–145)
Total Bilirubin: 0.7 mg/dL (ref 0.3–1.2)
Total Protein: 7.3 g/dL (ref 6.5–8.1)

## 2021-07-24 LAB — URINALYSIS, ROUTINE W REFLEX MICROSCOPIC
Bilirubin Urine: NEGATIVE
Glucose, UA: NEGATIVE mg/dL
Ketones, ur: NEGATIVE mg/dL
Nitrite: POSITIVE — AB
Protein, ur: NEGATIVE mg/dL
Specific Gravity, Urine: 1.004 — ABNORMAL LOW (ref 1.005–1.030)
WBC, UA: >50 WBC/hpf — ABNORMAL HIGH (ref 0–5)
pH: 7 (ref 5.0–8.0)

## 2021-07-24 LAB — TROPONIN I (HIGH SENSITIVITY)
Troponin I (High Sensitivity): 7 ng/L (ref <18)
Troponin I (High Sensitivity): 6 ng/L (ref <18)

## 2021-07-24 LAB — MAGNESIUM: Magnesium: 1.9 mg/dL (ref 1.7–2.4)

## 2021-07-24 LAB — PROTIME-INR
INR: 1.1 (ref 0.8–1.2)
Prothrombin Time: 13.6 seconds (ref 11.4–15.2)

## 2021-07-24 LAB — BRAIN NATRIURETIC PEPTIDE: B Natriuretic Peptide: 115.1 pg/mL — ABNORMAL HIGH (ref 0.0–100.0)

## 2021-07-24 MED ORDER — DOCUSATE SODIUM 100 MG PO CAPS
100.0000 mg | ORAL_CAPSULE | Freq: Every day | ORAL | Status: DC
  Administered 2021-07-24 – 2021-07-26 (×3): 100 mg via ORAL
  Filled 2021-07-24 (×3): qty 1

## 2021-07-24 MED ORDER — EZETIMIBE 10 MG PO TABS
10.0000 mg | ORAL_TABLET | Freq: Every day | ORAL | Status: DC
  Administered 2021-07-24 – 2021-07-26 (×3): 10 mg via ORAL
  Filled 2021-07-24 (×3): qty 1

## 2021-07-24 MED ORDER — GUAIFENESIN ER 600 MG PO TB12
600.0000 mg | ORAL_TABLET | Freq: Two times a day (BID) | ORAL | Status: DC
  Administered 2021-07-24 – 2021-07-26 (×4): 600 mg via ORAL
  Filled 2021-07-24 (×4): qty 1

## 2021-07-24 MED ORDER — ACETAMINOPHEN 650 MG RE SUPP: 650.0000 mg | Freq: Four times a day (QID) | RECTAL | Status: DC | PRN

## 2021-07-24 MED ORDER — ATENOLOL 50 MG PO TABS
50.0000 mg | ORAL_TABLET | Freq: Every day | ORAL | Status: DC
  Administered 2021-07-25 – 2021-07-27 (×3): 50 mg via ORAL
  Filled 2021-07-24 (×3): qty 1

## 2021-07-24 MED ORDER — SODIUM CHLORIDE 0.9 % IV SOLN
1.0000 g | Freq: Once | INTRAVENOUS | Status: AC
  Administered 2021-07-24: 1 g via INTRAVENOUS
  Filled 2021-07-24: qty 10

## 2021-07-24 MED ORDER — HYDRALAZINE HCL 25 MG PO TABS
25.0000 mg | ORAL_TABLET | Freq: Two times a day (BID) | ORAL | Status: DC
  Administered 2021-07-24 – 2021-07-27 (×6): 25 mg via ORAL
  Filled 2021-07-24 (×6): qty 1

## 2021-07-24 MED ORDER — ALPRAZOLAM 0.25 MG PO TABS
0.2500 mg | ORAL_TABLET | Freq: Every evening | ORAL | Status: DC | PRN
  Administered 2021-07-24: 0.25 mg via ORAL
  Filled 2021-07-24: qty 1

## 2021-07-24 MED ORDER — ALBUTEROL SULFATE (2.5 MG/3ML) 0.083% IN NEBU: 2.5000 mg | INHALATION_SOLUTION | RESPIRATORY_TRACT | Status: DC | PRN

## 2021-07-24 MED ORDER — SODIUM CHLORIDE 0.9 % IV SOLN
500.0000 mg | Freq: Once | INTRAVENOUS | Status: AC
  Administered 2021-07-24: 500 mg via INTRAVENOUS
  Filled 2021-07-24: qty 5

## 2021-07-24 MED ORDER — TRAMADOL HCL 50 MG PO TABS
50.0000 mg | ORAL_TABLET | Freq: Every day | ORAL | Status: DC
  Administered 2021-07-24: 50 mg via ORAL
  Administered 2021-07-25 – 2021-07-26 (×2): 100 mg via ORAL
  Filled 2021-07-24 (×2): qty 2
  Filled 2021-07-24: qty 1

## 2021-07-24 MED ORDER — PRESERVISION AREDS 2 PO CAPS: 1.0000 | ORAL_CAPSULE | Freq: Every day | ORAL | Status: DC

## 2021-07-24 MED ORDER — IOHEXOL 350 MG/ML SOLN
100.0000 mL | Freq: Once | INTRAVENOUS | Status: AC | PRN
  Administered 2021-07-24: 80 mL via INTRAVENOUS

## 2021-07-24 MED ORDER — ARFORMOTEROL TARTRATE 15 MCG/2ML IN NEBU
15.0000 ug | INHALATION_SOLUTION | Freq: Two times a day (BID) | RESPIRATORY_TRACT | Status: DC
Start: 2021-07-24 — End: 2021-07-27
  Administered 2021-07-25 – 2021-07-27 (×5): 15 ug via RESPIRATORY_TRACT
  Filled 2021-07-24 (×7): qty 2

## 2021-07-24 MED ORDER — SODIUM CHLORIDE 0.9 % IV SOLN
2.0000 g | INTRAVENOUS | Status: DC
  Administered 2021-07-25 – 2021-07-26 (×2): 2 g via INTRAVENOUS
  Filled 2021-07-24 (×3): qty 20

## 2021-07-24 MED ORDER — ATORVASTATIN CALCIUM 40 MG PO TABS
40.0000 mg | ORAL_TABLET | ORAL | Status: DC
  Administered 2021-07-25: 40 mg via ORAL
  Filled 2021-07-24: qty 1

## 2021-07-24 MED ORDER — POTASSIUM CHLORIDE CRYS ER 20 MEQ PO TBCR
40.0000 meq | EXTENDED_RELEASE_TABLET | Freq: Two times a day (BID) | ORAL | Status: AC
  Administered 2021-07-24 – 2021-07-25 (×3): 40 meq via ORAL
  Filled 2021-07-24 (×3): qty 2

## 2021-07-24 MED ORDER — ENOXAPARIN SODIUM 40 MG/0.4ML IJ SOSY
40.0000 mg | PREFILLED_SYRINGE | INTRAMUSCULAR | Status: DC
  Administered 2021-07-24 – 2021-07-26 (×3): 40 mg via SUBCUTANEOUS
  Filled 2021-07-24 (×2): qty 0.4

## 2021-07-24 MED ORDER — AZITHROMYCIN 250 MG PO TABS
500.0000 mg | ORAL_TABLET | Freq: Every day | ORAL | Status: DC
  Administered 2021-07-25 – 2021-07-26 (×2): 500 mg via ORAL
  Filled 2021-07-24 (×2): qty 2

## 2021-07-24 MED ORDER — POLYETHYLENE GLYCOL 3350 17 G PO PACK
17.0000 g | PACK | Freq: Every day | ORAL | Status: DC
  Administered 2021-07-24 – 2021-07-26 (×3): 17 g via ORAL
  Filled 2021-07-24 (×3): qty 1

## 2021-07-24 MED ORDER — SODIUM CHLORIDE 0.9 % IV SOLN: Freq: Once | INTRAVENOUS | Status: AC

## 2021-07-24 MED ORDER — UMECLIDINIUM BROMIDE 62.5 MCG/ACT IN AEPB
1.0000 | INHALATION_SPRAY | Freq: Every day | RESPIRATORY_TRACT | Status: DC
Start: 2021-07-25 — End: 2021-07-27
  Administered 2021-07-25 – 2021-07-27 (×3): 1 via RESPIRATORY_TRACT
  Filled 2021-07-24: qty 7

## 2021-07-24 MED ORDER — LOSARTAN POTASSIUM 50 MG PO TABS
100.0000 mg | ORAL_TABLET | Freq: Every day | ORAL | Status: DC
  Administered 2021-07-25 – 2021-07-27 (×3): 100 mg via ORAL
  Filled 2021-07-24 (×3): qty 2

## 2021-07-24 MED ORDER — PROSIGHT PO TABS
1.0000 | ORAL_TABLET | Freq: Every day | ORAL | Status: DC
  Administered 2021-07-25 – 2021-07-27 (×3): 1 via ORAL
  Filled 2021-07-24 (×3): qty 1

## 2021-07-24 MED ORDER — AMITRIPTYLINE HCL 25 MG PO TABS
100.0000 mg | ORAL_TABLET | Freq: Every day | ORAL | Status: DC
Start: 2021-07-24 — End: 2021-07-27
  Administered 2021-07-24 – 2021-07-26 (×3): 100 mg via ORAL
  Filled 2021-07-24 (×3): qty 4

## 2021-07-24 MED ORDER — SODIUM CHLORIDE (PF) 0.9 % IJ SOLN
INTRAMUSCULAR | Status: AC
  Filled 2021-07-24: qty 50

## 2021-07-24 MED ORDER — ACETAMINOPHEN 325 MG PO TABS: 650.0000 mg | ORAL_TABLET | Freq: Four times a day (QID) | ORAL | Status: DC | PRN

## 2021-07-24 NOTE — H&P (Signed)
?History and Physical  ? ? ?Patient: Heidi Hobbs GBT:[id number] DOB: [birth year] ?DOA: 07/24/2021 ?DOS: the patient was seen and examined on 07/24/2021 ?PCP: Shon Baton, MD  ?Patient coming from: Home ? ?Chief Complaint:  ?Chief Complaint  ?Patient presents with  ? Cough  ? Dysuria  ? ?HPI: Heidi Hobbs is a 81 y.o. female with medical history significant of bronchiectasis, PMR, recurrent UTI, HTN, anxiety, HLD. Presenting with cough and dysuria. She has chronic cough that seems to have worsened in the last week. She had had increase shortness of breath and fever up to 101. She is not sure of having any sick contacts. She took her normal inhalers, but they didn't seem to help. Additionally, she has had increased burning with urination over the last week. She has had frequent small voiding sessions, but feels as if she is retaining urine. She has had low abdomen pain during this time as well. When her symptoms did not improve this morning, she decided to come to the ED for assistance.    ? ?Review of Systems: As mentioned in the history of present illness. All other systems reviewed and are negative. ?Past Medical History:  ?Diagnosis Date  ? Adenomatous colon polyp 1995, 2011  ? Anemia   ? Anxiety   ? Bronchiectasis ([place])   ? Cancer [facility])   ? uterine cancer  ? Carotid stenosis   ? 40-59% right, less than 40% left  ? Cataracts, bilateral   ? COPD (chronic obstructive pulmonary disease) ([place])   ? Dyspnea   ? Essential hypertension 02/13/2016  ? Hyperlipidemia   ? Hypertension   ? MAC (mycobacterium avium-intracellulare complex)   ? Macular degeneration, dry 02/13/2016  ? Polio 1959  ? Stomach ulcer 1960  ? Urinary tract infection   ? Vasculitis ([place])   ? Normocomplementemic urticarial   ? Vasculitis ([place]) 02/13/2016  ? Followed by [facility]   ? ?Past Surgical History:  ?Procedure Laterality Date  ? ANTERIOR CERVICAL DECOMP/DISCECTOMY FUSION N/A 11/27/2018  ? Procedure: CERVICAL FIVE TO SIX , CERVICAL SIX  TO SEVEN, ANTERIOR CERVICAL DECOMPRESSION/DISCECTOMY FUSION, ALLOGRAFT, PLATE;  Surgeon: [name], MD;  Location: [facility];  Service: Orthopedics;  Laterality: N/A;  ? APPENDECTOMY    ? rupture appendix  ? ARTERY BIOPSY N/A 08/17/2019  ? Procedure: BIOPSY TEMPORAL ARTERY;  Surgeon: Leta Baptist, MD;  Location: [facility];  Service: ENT;  Laterality: N/A;  ? BREAST ENHANCEMENT SURGERY    ? for inverted nipples  ? CATARACT EXTRACTION    ? COLONOSCOPY    ? COLONOSCOPY  10/14/2009  ? LAPAROSCOPIC APPENDECTOMY N/A 01/23/2016  ? Procedure: APPENDECTOMY LAPAROSCOPIC;  Surgeon: Erroll Luna, MD;  Location: [facility];  Service: General;  Laterality: N/A;  ? POLYPECTOMY  2011  ? x8  ? TONSILLECTOMY AND ADENOIDECTOMY    ? VAGINAL HYSTERECTOMY  1970  ? uterine cancer  ? ?Social History:  reports that she quit smoking about 3 years ago. Her smoking use included cigarettes. She has a 25.00 pack-year smoking history. She has never used smokeless tobacco. She reports that she does not drink alcohol and does not use drugs. ? ?Allergies  ?Allergen Reactions  ? Eggs Or Egg-Derived Products   ?  Skin nodules  ? ? ?Family History  ?Problem Relation Age of Onset  ? Colon cancer Father   ? Esophageal cancer Brother   ? Esophageal cancer Brother   ? Colon cancer Brother   ? Asthma Daughter   ?  as a child  ? Stomach cancer Neg Hx   ? Rectal cancer Neg Hx   ? ? ?Prior to Admission medications   ?Medication Sig Start Date End Date Taking? Authorizing Provider  ?acetaminophen (TYLENOL) 500 MG tablet Take 1,000 mg by mouth every 6 (six) hours as needed for fever.   Yes [provider]  ?albuterol (VENTOLIN HFA) 108 (90 Base) MCG/ACT inhaler Inhale 2 puffs into the lungs every 4 (four) hours as needed for wheezing or shortness of breath. 01/06/21  Yes Parrett, Fonnie Mu, NP  ?ALPRAZolam (XANAX) 0.25 MG tablet Take 0.25 tablets by mouth at bedtime. And as needed up to 2 times a day 07/22/17  Yes [provider]   ?amitriptyline (ELAVIL) 100 MG tablet Take 100 mg by mouth at bedtime.    Yes [provider]  ?Ascorbic Acid (VITAMIN C) 1000 MG tablet Take 1,000 mg by mouth daily.   Yes [provider]  ?atenolol (TENORMIN) 50 MG tablet Take 50 mg by mouth daily.   Yes [provider]  ?atorvastatin (LIPITOR) 40 MG tablet Take 40 mg by mouth every other day. At night   Yes [provider]  ?cholecalciferol (VITAMIN D3) 25 MCG (1000 UT) tablet Take 1,000 Units by mouth daily.   Yes [provider]  ?docusate sodium (COLACE) 100 MG capsule You can buy this at any drug store, over the counter and follow package instructions ?Patient taking differently: Take 100 mg by mouth at bedtime. 02/01/16  Yes Earnstine Regal, PA-C  ?ezetimibe (ZETIA) 10 MG tablet Take 10 mg by mouth at bedtime.    Yes [provider]  ?hydrALAZINE (APRESOLINE) 25 MG tablet Take 25 mg by mouth 2 (two) times daily. 02/02/21  Yes [provider]  ?hydrochlorothiazide (HYDRODIURIL) 25 MG tablet Take 12.5 mg by mouth daily.   Yes [provider]  ?ibuprofen (ADVIL) 200 MG tablet Take 400 mg by mouth every 6 (six) hours as needed for moderate pain.   Yes [provider]  ?losartan (COZAAR) 100 MG tablet Take 100 mg by mouth daily.   Yes [provider]  ?Multiple Vitamins-Minerals (PRESERVISION AREDS 2) CAPS Take 1 capsule by mouth daily.   Yes [provider]  ?omeprazole (PRILOSEC OTC) 20 MG tablet Take 1 tablet (20 mg total) by mouth daily before breakfast. ?Patient taking differently: Take 20 mg by mouth daily. 01/29/20  Yes Tanda Rockers, MD  ?polyethylene glycol (MIRALAX / GLYCOLAX) 17 g packet Take 17 g by mouth daily. ?Patient taking differently: Take 17 g by mouth at bedtime. 02/10/21  Yes Shelly Coss, MD  ?PRESCRIPTION MEDICATION Place 1 Dose into both eyes every 30 (thirty) days. Ranibizumab injection - not sure about dosage.   Yes [provider]  ?Propylene Glycol (SYSTANE COMPLETE OP) Place 1 drop into both eyes daily as needed (dry eyes after injection).   Yes [provider]  ?Tiotropium Bromide-Olodaterol (STIOLTO RESPIMAT) 2.5-2.5 MCG/ACT AERS Inhale 2 puffs into the lungs daily. 01/06/21  Yes Parrett, Tammy S, NP  ?traMADol (ULTRAM) 50 MG tablet Take 50-100 mg by mouth at bedtime. 02/27/19  Yes [provider]  ?vitamin B-12 (CYANOCOBALAMIN) 1000 MCG tablet Take 1,000 mcg by mouth daily.   Yes [provider]  ?Respiratory Therapy Supplies (FLUTTER) DEVI 1 Device by Does not apply route 2 (two) times daily. 06/08/19   [name], NP  ? ? ?Physical Exam: ?Vitals:  ? 07/24/21 1300 07/24/21 1441 07/24/21 1500 07/24/21  1530  ?BP: (!) 148/70 (!) 151/64 (!) 141/74 (!) 163/82  ?Pulse: 89 93 95 95  ?Resp: 13 18 (!) 22 15  ?Temp:      ?TempSrc:      ?SpO2: 96% 92% 92% 96%  ? ?General: 81 y.o. female resting in bed in NAD ?Eyes: PERRL, normal sclera ?ENMT: Nares patent w/o discharge, orophaynx clear, dentition normal, ears w/o discharge/lesions/ulcers ?Neck: Supple, trachea midline ?Cardiovascular: RRR, +S1, S2, no m/g/r, equal pulses throughout ?Respiratory: CTABL, no w/r/r, normal WOB ?GI: BS+, ND, RLQ/LLQ TTP, no masses noted, no organomegaly noted ?MSK: No e/c/c ?Neuro: A&O x 3, no focal deficits ?Psyc: Appropriate interaction and affect, calm/cooperative ? ?Data Reviewed: ? ?Na+  133 ?K+  3.0 ?Glucose  140 ?WBC  17.9 ? ?CTA Chest: ?1. Mucoid impaction and short-segment occlusion involving bilateral lower lobe segmental and subsegmental bronchi suggestive of progressive airways disease, chronic MAC/MAI infection and/or aspiration. No associated air bronchograms or definitive consolidative opacities to suggest pneumonia. ?2. No evidence of pulmonary embolism. ?3. Cardiomegaly with enlargement of the caliber the main pulmonary artery, nonspecific though could be seen in the setting of pulmonary arterial  hypertension. ?4. Aortic Atherosclerosis (ICD10-I70.0) and Emphysema (ICD10-J43.9). ? ?CT ab/pelvis w/ ?No acute findings in the abdomen or in the pelvis but with mobile cecum now in RIGHT upper quadrant. No focal narrowin

## 2021-07-24 NOTE — ED Triage Notes (Signed)
Pt BIB EMS from home. Pt reports pneumonia-like symptoms x1 week and has been trying to treat at home. Pt also reports developing UTI symptoms over the past few days. Pt reports productive cough, but now has noticed blood with coughing.  ? ?91% RA on 2L 98% ?Albuterol ?Atrovent ?125 Solumedrol ?20G LAC ?BP 148/70 ? ? ? ?

## 2021-07-24 NOTE — ED Notes (Signed)
890m removed via in and out cath. MD made aware. ?

## 2021-07-24 NOTE — ED Provider Notes (Signed)
?[facility] DEPT ?Provider Note ? ? ?CSN: [id number] ?Arrival date & time: 07/24/21  1105 ? ?  ? ?History ? ?Chief Complaint  ?Patient presents with  ? Cough  ? Dysuria  ? ? ?Heidi Hobbs is a 81 y.o. female.  Presents to ER due to concern for cough, dysuria.  Patient states that cough has been going on for about a week, seems to be getting progressively worse.  Productive with mostly clear sputum but last night she had some blood in her cough.  This morning noted small streak of blood but then the cough seems to have cleared up and does not have ongoing bleeding at this time.  She denies chest pain.  Does feel somewhat short of breath, no difficulty breathing right now however.  She also has had some lower abdominal discomfort.  Also has had dysuria for the last few days.  States that she went to her urologist and they obtained a urinalysis but she has not been informed of any results.  Has tried taking Azo's with some relief.  No fevers. ? ?Reviewed last discharge summary from November 2022.  Admitted for COVID-19.  Notable past medical history of bronchiectasis, polymyalgia rheumatica, chronic headache, recurrent UTI, hypertension. ? ?HPI ?  ? ?Home Medications ?Prior to Admission medications   ?Medication Sig Start Date End Date Taking? Authorizing Provider  ?albuterol (VENTOLIN HFA) 108 (90 Base) MCG/ACT inhaler Inhale 2 puffs into the lungs every 4 (four) hours as needed for wheezing or shortness of breath. 01/06/21   Parrett, Fonnie Mu, NP  ?ALPRAZolam (XANAX) 0.25 MG tablet Take 0.25 tablets by mouth daily as needed for anxiety.  07/22/17   [provider]  ?amitriptyline (ELAVIL) 100 MG tablet Take 100 mg by mouth at bedtime.     [provider]  ?atenolol (TENORMIN) 50 MG tablet Take 50 mg by mouth daily.    [provider]  ?atorvastatin (LIPITOR) 40 MG tablet Take 40 mg by mouth every other day. At night    [provider]  ?cholecalciferol  (VITAMIN D3) 25 MCG (1000 UT) tablet Take 1,000 Units by mouth daily.    [provider]  ?docusate sodium (COLACE) 100 MG capsule You can buy this at any drug store, over the counter and follow package instructions ?Patient taking differently: Take 100 mg by mouth at bedtime. 02/01/16   Earnstine Regal, PA-C  ?ezetimibe (ZETIA) 10 MG tablet Take 10 mg by mouth at bedtime.     [provider]  ?hydrALAZINE (APRESOLINE) 25 MG tablet Take 25 mg by mouth 2 (two) times daily. 02/02/21   [provider]  ?hydrochlorothiazide (HYDRODIURIL) 25 MG tablet Take 25 mg by mouth daily.    [provider]  ?losartan (COZAAR) 100 MG tablet Take 100 mg by mouth daily.    [provider]  ?omeprazole (PRILOSEC OTC) 20 MG tablet Take 1 tablet (20 mg total) by mouth daily before breakfast. ?Patient taking differently: Take 20 mg by mouth daily as needed (for acid reflux). 01/29/20   Tanda Rockers, MD  ?polyethylene glycol (MIRALAX / GLYCOLAX) 17 g packet Take 17 g by mouth daily. 02/10/21   Shelly Coss, MD  ?Respiratory Therapy Supplies (FLUTTER) DEVI 1 Device by Does not apply route 2 (two) times daily. 06/08/19   [name], NP  ?Tiotropium Bromide-Olodaterol (STIOLTO RESPIMAT) 2.5-2.5 MCG/ACT AERS Inhale 2 puffs into the lungs daily. 01/06/21   Parrett, Fonnie Mu, NP  ?traMADol (ULTRAM) 50  MG tablet Take 50 mg by mouth at bedtime as needed for moderate pain. 02/27/19   [provider]  ?   ? ?Allergies    ?Eggs or egg-derived products   ? ?Review of Systems   ?Review of Systems ? ?Physical Exam ?Updated Vital Signs ?BP (!) 151/64   Pulse 93   Temp 98.1 ?F (36.7 ?C) (Oral)   Resp 18   SpO2 92%  ?Physical Exam ?Vitals and nursing note reviewed.  ?Constitutional:   ?   General: She is not in acute distress. ?   Appearance: She is well-developed.  ?HENT:  ?   Head: Normocephalic and atraumatic.  ?Eyes:  ?   Conjunctiva/sclera: Conjunctivae normal.  ?Cardiovascular:  ?    Rate and Rhythm: Normal rate and regular rhythm.  ?   Heart sounds: No murmur heard. ?Pulmonary:  ?   Effort: Pulmonary effort is normal. No respiratory distress.  ?   Breath sounds: Normal breath sounds.  ?Abdominal:  ?   Palpations: Abdomen is soft.  ?   Comments: Some tenderness to palpation in the lower abdomen there is no rebound or guarding  ?Musculoskeletal:     ?   General: No swelling or deformity.  ?   Cervical back: Neck supple.  ?Skin: ?   General: Skin is warm and dry.  ?   Capillary Refill: Capillary refill takes less than 2 seconds.  ?Neurological:  ?   General: No focal deficit present.  ?   Mental Status: She is alert.  ?Psychiatric:     ?   Mood and Affect: Mood normal.  ? ? ?ED Results / Procedures / Treatments   ?Labs ?(all labs ordered are listed, but only abnormal results are displayed) ?Labs Reviewed  ?CBC WITH DIFFERENTIAL/PLATELET - Abnormal; Notable for the following components:  ?    Result Value  ? WBC 17.9 (*)   ? RDW 17.2 (*)   ? Neutro Abs 16.3 (*)   ? Abs Immature Granulocytes 0.13 (*)   ? All other components within normal limits  ?COMPREHENSIVE METABOLIC PANEL - Abnormal; Notable for the following components:  ? Sodium 133 (*)   ? Potassium 3.0 (*)   ? Chloride 95 (*)   ? Glucose, Bld 140 (*)   ? BUN 7 (*)   ? AST 14 (*)   ? All other components within normal limits  ?BRAIN NATRIURETIC PEPTIDE - Abnormal; Notable for the following components:  ? B Natriuretic Peptide 115.1 (*)   ? All other components within normal limits  ?RESP PANEL BY RT-PCR (FLU A&B, COVID) [id number]  ?PROTIME-INR  ?URINALYSIS, ROUTINE W REFLEX MICROSCOPIC  ?TROPONIN I (HIGH SENSITIVITY)  ?TROPONIN I (HIGH SENSITIVITY)  ? ? ?EKG ?None ? ?Radiology ?DG Chest 2 View ? ?Result Date: 07/24/2021 ?CLINICAL DATA:  Cough and shortness of breath. EXAM: CHEST - 2 VIEW COMPARISON:  02/08/2021 FINDINGS: Lungs are adequately inflated with minimal scarring over the lateral right base. There is minimal opacification over the left  base likely atelectasis although early infection is possible. Cardiomediastinal silhouette and remainder of the exam is unchanged. IMPRESSION: Minimal left base opacification likely atelectasis, although early infection is possible. Electronically Signed   By: Marin Olp M.D.   On: 07/24/2021 12:37  ? ?CT Angio Chest Pulmonary Embolism (PE) W or WO Contrast ? ?Result Date: 07/24/2021 ?CLINICAL DATA:  Chest pain and shortness of breath. Pneumonia like symptoms for the past week. Productive cough. Evaluate for pulmonary embolism. EXAM: CT ANGIOGRAPHY  CHEST WITH CONTRAST TECHNIQUE: Multidetector CT imaging of the chest was performed using the standard protocol during bolus administration of intravenous contrast. Multiplanar CT image reconstructions and MIPs were obtained to evaluate the vascular anatomy. RADIATION DOSE REDUCTION: This exam was performed according to the departmental dose-optimization program which includes automated exposure control, adjustment of the mA and/or kV according to patient size and/or use of iterative reconstruction technique. CONTRAST:  69m OMNIPAQUE IOHEXOL 350 MG/ML SOLN COMPARISON:  Chest CT-12/26/2019; 09/26/2019; 10/13/2017 CTA abdomen pelvis-01/23/2016 FINDINGS: Vascular Findings: There is adequate opacification of the pulmonary arterial system with the main pulmonary artery measuring 346 Hounsfield units. There are no discrete filling defects within the pulmonary arterial tree to suggest pulmonary embolism. Main pulmonary artery remains mildly enlarged measuring 35 mm in diameter. Borderline cardiomegaly. Coronary artery calcifications. Calcifications involving the mitral valve annulus and potentially the aortic leaflets. There is a large amount of irregular mixed calcified and noncalcified atherosclerotic plaque throughout the normal caliber thoracic aorta. No evidence of thoracic aortic dissection or perivascular stranding this nongated examination. Conventional configuration  of the aortic arch. The major branch vessels of the aortic arch appear patent throughout their imaged courses. Review of the MIP images confirms the above findings. ---------------------------------------

## 2021-07-25 DIAGNOSIS — Z8616 Personal history of COVID-19: Secondary | ICD-10-CM | POA: Diagnosis not present

## 2021-07-25 DIAGNOSIS — E876 Hypokalemia: Secondary | ICD-10-CM | POA: Diagnosis not present

## 2021-07-25 DIAGNOSIS — Z87440 Personal history of urinary (tract) infections: Secondary | ICD-10-CM | POA: Diagnosis not present

## 2021-07-25 DIAGNOSIS — N816 Rectocele: Secondary | ICD-10-CM | POA: Diagnosis not present

## 2021-07-25 DIAGNOSIS — F419 Anxiety disorder, unspecified: Secondary | ICD-10-CM | POA: Diagnosis not present

## 2021-07-25 DIAGNOSIS — Z20822 Contact with and (suspected) exposure to covid-19: Secondary | ICD-10-CM | POA: Diagnosis not present

## 2021-07-25 DIAGNOSIS — I10 Essential (primary) hypertension: Secondary | ICD-10-CM | POA: Diagnosis not present

## 2021-07-25 DIAGNOSIS — H3530 Unspecified macular degeneration: Secondary | ICD-10-CM | POA: Diagnosis not present

## 2021-07-25 DIAGNOSIS — J471 Bronchiectasis with (acute) exacerbation: Secondary | ICD-10-CM | POA: Diagnosis not present

## 2021-07-25 DIAGNOSIS — Z87891 Personal history of nicotine dependence: Secondary | ICD-10-CM | POA: Diagnosis not present

## 2021-07-25 DIAGNOSIS — R059 Cough, unspecified: Secondary | ICD-10-CM | POA: Diagnosis not present

## 2021-07-25 DIAGNOSIS — Z91012 Allergy to eggs: Secondary | ICD-10-CM | POA: Diagnosis not present

## 2021-07-25 DIAGNOSIS — E785 Hyperlipidemia, unspecified: Secondary | ICD-10-CM | POA: Diagnosis not present

## 2021-07-25 DIAGNOSIS — N390 Urinary tract infection, site not specified: Secondary | ICD-10-CM | POA: Diagnosis not present

## 2021-07-25 DIAGNOSIS — Z8542 Personal history of malignant neoplasm of other parts of uterus: Secondary | ICD-10-CM | POA: Diagnosis not present

## 2021-07-25 DIAGNOSIS — Z825 Family history of asthma and other chronic lower respiratory diseases: Secondary | ICD-10-CM | POA: Diagnosis not present

## 2021-07-25 DIAGNOSIS — Z981 Arthrodesis status: Secondary | ICD-10-CM | POA: Diagnosis not present

## 2021-07-25 DIAGNOSIS — M353 Polymyalgia rheumatica: Secondary | ICD-10-CM | POA: Diagnosis not present

## 2021-07-25 DIAGNOSIS — Z79899 Other long term (current) drug therapy: Secondary | ICD-10-CM | POA: Diagnosis not present

## 2021-07-25 LAB — CBC
HCT: 33.2 % — ABNORMAL LOW (ref 36.0–46.0)
Hemoglobin: 11.2 g/dL — ABNORMAL LOW (ref 12.0–15.0)
MCH: 29.1 pg (ref 26.0–34.0)
MCHC: 33.7 g/dL (ref 30.0–36.0)
MCV: 86.2 fL (ref 80.0–100.0)
Platelets: 261 10*3/uL (ref 150–400)
RBC: 3.85 MIL/uL — ABNORMAL LOW (ref 3.87–5.11)
RDW: 17.2 % — ABNORMAL HIGH (ref 11.5–15.5)
WBC: 15.6 10*3/uL — ABNORMAL HIGH (ref 4.0–10.5)
nRBC: 0 % (ref 0.0–0.2)

## 2021-07-25 LAB — COMPREHENSIVE METABOLIC PANEL
ALT: 9 U/L (ref 0–44)
AST: 11 U/L — ABNORMAL LOW (ref 15–41)
Albumin: 2.8 g/dL — ABNORMAL LOW (ref 3.5–5.0)
Alkaline Phosphatase: 53 U/L (ref 38–126)
Anion gap: 8 (ref 5–15)
BUN: 10 mg/dL (ref 8–23)
CO2: 24 mmol/L (ref 22–32)
Calcium: 8.6 mg/dL — ABNORMAL LOW (ref 8.9–10.3)
Chloride: 103 mmol/L (ref 98–111)
Creatinine, Ser: 0.46 mg/dL (ref 0.44–1.00)
GFR, Estimated: >60 mL/min (ref >60)
Glucose, Bld: 125 mg/dL — ABNORMAL HIGH (ref 70–99)
Potassium: 3.5 mmol/L (ref 3.5–5.1)
Sodium: 135 mmol/L (ref 135–145)
Total Bilirubin: 0.5 mg/dL (ref 0.3–1.2)
Total Protein: 6 g/dL — ABNORMAL LOW (ref 6.5–8.1)

## 2021-07-25 LAB — STREP PNEUMONIAE URINARY ANTIGEN: Strep Pneumo Urinary Antigen: NEGATIVE

## 2021-07-25 MED ORDER — MORPHINE SULFATE (PF) 2 MG/ML IV SOLN
1.0000 mg | Freq: Once | INTRAVENOUS | Status: AC
  Administered 2021-07-25: 1 mg via INTRAVENOUS
  Filled 2021-07-25: qty 1

## 2021-07-25 MED ORDER — SODIUM CHLORIDE 0.9 % IV SOLN: INTRAVENOUS | Status: DC

## 2021-07-25 NOTE — Progress Notes (Signed)
?PROGRESS NOTE ? ? ?Heidi Hobbs  EVO:[id number] DOB: June 14, 1940 DOA: 07/24/2021 ?PCP: Shon Baton, MD  ?Brief Narrative:  ?13 Y female with  ?polymyalgia rheumatica [nonresponsive 2 prednisone--?  Cervicogenic and follows with Dr. Tomi Likens neurology/Dr. Trudie Reed rheumatology-intolerant gabapentin] ?Prior C5-C6 and C6-C7 ACDF 2020 ?History of bronchiectasis, recurrent UTIs ?Had COVID 01/2021 was admitted for this and given standard therapies 3 doses remdesivir additionally ?Prior GI bleed?  With history of severe diverticulosis colonic polyps ?Known nonobstructive carotid stenosis ? ?Developed chronic cough worsening over the past week fever 101-took inhalers did not help and also felt like having urine retention ? ?Hospital-Problem based course ? ??  Recurrent UTI-UA shows large leukocytes positive nitrites ?Underlying bronchiectasis ?Continue Azithro/Ceftriaxone for now ?Narrow based on BC, UC ?Cont IVF 50 cc/h--WBC improving ?Polymyalgia rheumatica (had 13 months of steroids which did not help) now on no therapy-followed by Dr. Trudie Reed, Dr. Tomi Likens for headaches ?Gabapentin was ineffective additionally ?Probably cervicogenic given prior ACDF ?Continue at this time tramadol 50-100 at bedtime, continue amitriptyline 100 at bedtime ?Hypokalemia on admission potassium 3.0 ?Replaced orally p.o. potassium-magnesium 1.9 ?Reported rectocele when followed with GI Dr. Fuller Plan, urologist Dr. Clayborne Dana ?Prior diverticulosis colonic polyps ?Suggested to family to get a urogynecologist?  May benefit from prolapse surgery? ?Outpatient follow-up Dr. Fuller Plan ? ?DVT prophylaxis: Lovenox ?Code Status: Full ?Family Communication: Discussed with daughter at the bedside ?Disposition:  ?Status is: Observation ?The patient will require care spanning > 2 midnights and should be moved to inpatient because:  ? ?The patient is still not stable for discharge unclear source of fever await blood cultures and narrow expectantly ?  ?Consultants:   ? ? ?Procedures:  ? ?Antimicrobials:   ? ? ?Subjective: ?Awake coherent feels a little better not on oxygen ?Has some burning in the urine ?No chest pain ?Tolerating some diet ?Daughter is at the bedside ? ?Objective: ?Vitals:  ? 07/25/21 0115 07/25/21 0536 07/25/21 0751 07/25/21 1003  ?BP: 136/67 126/68  (!) 145/62  ?Pulse: 82 80  79  ?Resp: 14 18  (!) 21  ?Temp: 98.3 ?F (36.8 ?C) 97.7 ?F (36.5 ?C)  97.8 ?F (36.6 ?C)  ?TempSrc: Oral Oral  Oral  ?SpO2: 94% 94% 94% 97%  ?Height: '5\' 7"'$  (1.702 m)     ? ? ?Intake/Output Summary (Last 24 hours) at 07/25/2021 1124 ?Last data filed at 07/25/2021 0900 ?Gross per 24 hour  ?Intake 710 ml  ?Output 1205 ml  ?Net -495 ml  ? ?There were no vitals filed for this visit. ? ?Examination: ? ?EOMI NCAT bitemporal wasting supraclavicular wasting ?Chest clear no rales rhonchi wheeze ?E9-H3 holosystolic murmur ?Abdomen soft no rebound no guarding ?ROM intact ?No focal deficit ? ? ? ?Data Reviewed: personally reviewed  ? ?CBC ?   ?Component Value Date/Time  ? WBC 15.6 (H) 07/25/2021 0650  ? RBC 3.85 (L) 07/25/2021 0650  ? HGB 11.2 (L) 07/25/2021 0650  ? HCT 33.2 (L) 07/25/2021 0650  ? PLT 261 07/25/2021 0650  ? MCV 86.2 07/25/2021 0650  ? MCH 29.1 07/25/2021 0650  ? MCHC 33.7 07/25/2021 0650  ? RDW 17.2 (H) 07/25/2021 0650  ? LYMPHSABS 0.9 07/24/2021 1250  ? MONOABS 0.5 07/24/2021 1250  ? EOSABS 0.0 07/24/2021 1250  ? BASOSABS 0.0 07/24/2021 1250  ? ? ?  Latest Ref Rng & Units 07/25/2021  ?  6:50 AM 07/24/2021  ? 12:50 PM 06/09/2021  ? 11:20 AM  ?CMP  ?Glucose 70 - 99 mg/dL 125   140     ?  BUN 8 - 23 mg/dL '10   7   5    '$ ?Creatinine 0.44 - 1.00 mg/dL 0.46   0.55   0.61    ?Sodium 135 - 145 mmol/L 135   133     ?Potassium 3.5 - 5.1 mmol/L 3.5   3.0     ?Chloride 98 - 111 mmol/L 103   95     ?CO2 22 - 32 mmol/L 24   27     ?Calcium 8.9 - 10.3 mg/dL 8.6   9.1     ?Total Protein 6.5 - 8.1 g/dL 6.0   7.3     ?Total Bilirubin 0.3 - 1.2 mg/dL 0.5   0.7     ?Alkaline Phos 38 - 126 U/L 53   67     ?AST  15 - 41 U/L 11   14     ?ALT 0 - 44 U/L 9   10     ? ? ? ?Radiology Studies: ?DG Chest 2 View ? ?Result Date: 07/24/2021 ?CLINICAL DATA:  Cough and shortness of breath. EXAM: CHEST - 2 VIEW COMPARISON:  02/08/2021 FINDINGS: Lungs are adequately inflated with minimal scarring over the lateral right base. There is minimal opacification over the left base likely atelectasis although early infection is possible. Cardiomediastinal silhouette and remainder of the exam is unchanged. IMPRESSION: Minimal left base opacification likely atelectasis, although early infection is possible. Electronically Signed   By: Marin Olp M.D.   On: 07/24/2021 12:37  ? ?CT Angio Chest Pulmonary Embolism (PE) W or WO Contrast ? ?Result Date: 07/24/2021 ?CLINICAL DATA:  Chest pain and shortness of breath. Pneumonia like symptoms for the past week. Productive cough. Evaluate for pulmonary embolism. EXAM: CT ANGIOGRAPHY CHEST WITH CONTRAST TECHNIQUE: Multidetector CT imaging of the chest was performed using the standard protocol during bolus administration of intravenous contrast. Multiplanar CT image reconstructions and MIPs were obtained to evaluate the vascular anatomy. RADIATION DOSE REDUCTION: This exam was performed according to the departmental dose-optimization program which includes automated exposure control, adjustment of the mA and/or kV according to patient size and/or use of iterative reconstruction technique. CONTRAST:  11m OMNIPAQUE IOHEXOL 350 MG/ML SOLN COMPARISON:  Chest CT-12/26/2019; 09/26/2019; 10/13/2017 CTA abdomen pelvis-01/23/2016 FINDINGS: Vascular Findings: There is adequate opacification of the pulmonary arterial system with the main pulmonary artery measuring 346 Hounsfield units. There are no discrete filling defects within the pulmonary arterial tree to suggest pulmonary embolism. Main pulmonary artery remains mildly enlarged measuring 35 mm in diameter. Borderline cardiomegaly. Coronary artery calcifications.  Calcifications involving the mitral valve annulus and potentially the aortic leaflets. There is a large amount of irregular mixed calcified and noncalcified atherosclerotic plaque throughout the normal caliber thoracic aorta. No evidence of thoracic aortic dissection or perivascular stranding this nongated examination. Conventional configuration of the aortic arch. The major branch vessels of the aortic arch appear patent throughout their imaged courses. Review of the MIP images confirms the above findings. ---------------------------------------------------------------------------------- Nonvascular Findings: Mediastinum/Lymph Nodes: Scattered mediastinal and bilateral hilar lymph nodes are prominent though individually not enlarged by size criteria with index left-sided paratracheal lymph node measuring 0.9 cm in greatest short axis diameter (image 39, series 5, index right suprahilar lymph node measuring 0.7 cm (image 47, series 3) and left hilar lymph node measuring 0.7 cm (image 45, series 3), presumably reactive in etiology. No bulky mediastinal, hilar or axillary lymphadenopathy. Lungs/Pleura: Mucoid impaction in short-segment occlusion involving several bilateral lower lobe segmental and subsegmental bronchi (representative images 67, 84  and 90, series 5). These findings are associated with worsening bibasilar heterogeneous opacities. No associated air bronchograms. Redemonstrated advanced apical predominant centrilobular emphysematous change with mild progressive interseptal thickening. No pleural effusion or pneumothorax. No new or enlarging pulmonary nodules. Punctate (sub 5 mm) right upper lobe pulmonary nodules (images 31 and 35, series 5), are both unchanged since at least the 10/2017 examination and thus of benign etiology. Upper abdomen: Limited early arterial phase evaluation the upper abdomen nodular thickening of the left adrenal gland similar to remote abdominal CT performed 01/2016 with imaging  stability suggestive of a benign etiology such as adrenal adenomas. Musculoskeletal: No acute or aggressive osseous abnormalities. Post lower cervical ACDF and intervertebral disc space replacement, inco

## 2021-07-25 NOTE — Progress Notes (Signed)
Pt voided 400 ml. Bladder-scanned after. 524 noted. Pt continues to refuse in and out cath and continues to try to urinate on her own.  ?

## 2021-07-26 DIAGNOSIS — R059 Cough, unspecified: Secondary | ICD-10-CM | POA: Diagnosis not present

## 2021-07-26 MED ORDER — GUAIFENESIN 100 MG/5ML PO LIQD: 5.0000 mL | ORAL | Status: DC | PRN

## 2021-07-26 MED ORDER — MORPHINE SULFATE (PF) 2 MG/ML IV SOLN
1.0000 mg | Freq: Once | INTRAVENOUS | Status: AC
  Administered 2021-07-26: 1 mg via INTRAVENOUS
  Filled 2021-07-26: qty 1

## 2021-07-26 NOTE — Progress Notes (Signed)
?PROGRESS NOTE ? ? ?Heidi Hobbs  CWC:[id number] DOB: 1941/02/15 DOA: 07/24/2021 ?PCP: Shon Baton, MD  ?Brief Narrative:  ?89 Y female with  ?polymyalgia rheumatica [nonresponsive 2 prednisone--?  Cervicogenic and follows with Dr. Tomi Likens neurology/Dr. Trudie Reed rheumatology-intolerant gabapentin] ?Prior C5-C6 and C6-C7 ACDF 2020 ?History of bronchiectasis, recurrent UTIs ?Had COVID 01/2021 was admitted for this and given standard therapies 3 doses remdesivir additionally ?Prior GI bleed?  With history of severe diverticulosis colonic polyps ?Known nonobstructive carotid stenosis ? ?Developed chronic cough worsening over the past week fever 101-took inhalers did not help and also felt like having urine retention ? ?Hospital-Problem based course ? ??  Recurrent UTI-UA shows large leukocytes positive nitrites ?Underlying bronchiectasis ?Azithro/Ceftriaxone --->narrow to oral Levaquin- as white count has dropped ?No cultures performed? ?Polymyalgia rheumatica (had 13 months of steroids which did not help) now on no therapy-followed by Dr. Trudie Reed, Dr. Tomi Likens for headaches ?Gabapentin was ineffective additionally ?Continue at this time tramadol 50-100 at bedtime, continue amitriptyline 100 at bedtime ?Hypokalemia on admission potassium 3.0 ?Improved-Daily check ?Reported rectocele when followed with GI Dr. Fuller Plan, urologist Dr. Clayborne Dana ?Prior diverticulosis colonic polyps ?Suggested to family to get a urogynecologist?  May benefit from prolapse surgery? ?Nursing reports patient still retains urine despite bedside urination-patient is reticent to have a Foley catheter placed ?Will need follow-up with [facility] urology Dr. Hanley Seamen to discuss options ? ?DVT prophylaxis: Lovenox ?Code Status: Full ?Family Communication: No family present this afternoon ?Disposition:  ?Status is: Observation ?The patient will require care spanning > 2 midnights and should be moved to inpatient because:  ? ?The patient is still not  stable for discharge unclear source of fever await blood cultures and narrow expectantly ?  ?Consultants:  ? ? ?Procedures:  ? ?Antimicrobials:   ? ? ?Subjective: ? ?Seems well-a little tired did not rest well overnight ?Asking if she can go home however ?Note Tmax 99.6 ?She is coughing some-I encouraged her to ask for cough suppressant to help her rest ? ?Objective: ?Vitals:  ? 07/25/21 1003 07/25/21 2004 07/25/21 2027 07/26/21 0429  ?BP: (!) 145/62  (!) 174/79 (!) 157/66  ?Pulse: 79  79 92  ?Resp: (!) '21  18 18  '$ ?Temp: 97.8 ?F (36.6 ?C)  98.7 ?F (37.1 ?C) 99.1 ?F (37.3 ?C)  ?TempSrc: Oral  Oral Oral  ?SpO2: 97% 98% 96% 93%  ?Height:      ? ? ?Intake/Output Summary (Last 24 hours) at 07/26/2021 1017 ?Last data filed at 07/26/2021 0900 ?Gross per 24 hour  ?Intake 1361 ml  ?Output 2565 ml  ?Net -1204 ml  ? ? ?There were no vitals filed for this visit. ? ?Examination: ? ?EOMI NCAT frail white female no icterus no pallor not on oxygen ?CTA B no rales rhonchi wheeze ?S1-S2 no murmur ?Abdomen soft nontender no rebound ?No lower extremity edema ?Neurologically intact although is weak ? ? ? ?Data Reviewed: personally reviewed  ? ?CBC ?   ?Component Value Date/Time  ? WBC 15.6 (H) 07/25/2021 0650  ? RBC 3.85 (L) 07/25/2021 0650  ? HGB 11.2 (L) 07/25/2021 0650  ? HCT 33.2 (L) 07/25/2021 0650  ? PLT 261 07/25/2021 0650  ? MCV 86.2 07/25/2021 0650  ? MCH 29.1 07/25/2021 0650  ? MCHC 33.7 07/25/2021 0650  ? RDW 17.2 (H) 07/25/2021 0650  ? LYMPHSABS 0.9 07/24/2021 1250  ? MONOABS 0.5 07/24/2021 1250  ? EOSABS 0.0 07/24/2021 1250  ? BASOSABS 0.0 07/24/2021 1250  ? ? ?  Latest Ref  Rng & Units 07/25/2021  ?  6:50 AM 07/24/2021  ? 12:50 PM 06/09/2021  ? 11:20 AM  ?CMP  ?Glucose 70 - 99 mg/dL 125   140     ?BUN 8 - 23 mg/dL '10   7   5    '$ ?Creatinine 0.44 - 1.00 mg/dL 0.46   0.55   0.61    ?Sodium 135 - 145 mmol/L 135   133     ?Potassium 3.5 - 5.1 mmol/L 3.5   3.0     ?Chloride 98 - 111 mmol/L 103   95     ?CO2 22 - 32 mmol/L 24   27      ?Calcium 8.9 - 10.3 mg/dL 8.6   9.1     ?Total Protein 6.5 - 8.1 g/dL 6.0   7.3     ?Total Bilirubin 0.3 - 1.2 mg/dL 0.5   0.7     ?Alkaline Phos 38 - 126 U/L 53   67     ?AST 15 - 41 U/L 11   14     ?ALT 0 - 44 U/L 9   10     ? ? ? ?Radiology Studies: ?DG Chest 2 View ? ?Result Date: 07/24/2021 ?CLINICAL DATA:  Cough and shortness of breath. EXAM: CHEST - 2 VIEW COMPARISON:  02/08/2021 FINDINGS: Lungs are adequately inflated with minimal scarring over the lateral right base. There is minimal opacification over the left base likely atelectasis although early infection is possible. Cardiomediastinal silhouette and remainder of the exam is unchanged. IMPRESSION: Minimal left base opacification likely atelectasis, although early infection is possible. Electronically Signed   By: Marin Olp M.D.   On: 07/24/2021 12:37  ? ?CT Angio Chest Pulmonary Embolism (PE) W or WO Contrast ? ?Result Date: 07/24/2021 ?CLINICAL DATA:  Chest pain and shortness of breath. Pneumonia like symptoms for the past week. Productive cough. Evaluate for pulmonary embolism. EXAM: CT ANGIOGRAPHY CHEST WITH CONTRAST TECHNIQUE: Multidetector CT imaging of the chest was performed using the standard protocol during bolus administration of intravenous contrast. Multiplanar CT image reconstructions and MIPs were obtained to evaluate the vascular anatomy. RADIATION DOSE REDUCTION: This exam was performed according to the departmental dose-optimization program which includes automated exposure control, adjustment of the mA and/or kV according to patient size and/or use of iterative reconstruction technique. CONTRAST:  73m OMNIPAQUE IOHEXOL 350 MG/ML SOLN COMPARISON:  Chest CT-12/26/2019; 09/26/2019; 10/13/2017 CTA abdomen pelvis-01/23/2016 FINDINGS: Vascular Findings: There is adequate opacification of the pulmonary arterial system with the main pulmonary artery measuring 346 Hounsfield units. There are no discrete filling defects within the pulmonary  arterial tree to suggest pulmonary embolism. Main pulmonary artery remains mildly enlarged measuring 35 mm in diameter. Borderline cardiomegaly. Coronary artery calcifications. Calcifications involving the mitral valve annulus and potentially the aortic leaflets. There is a large amount of irregular mixed calcified and noncalcified atherosclerotic plaque throughout the normal caliber thoracic aorta. No evidence of thoracic aortic dissection or perivascular stranding this nongated examination. Conventional configuration of the aortic arch. The major branch vessels of the aortic arch appear patent throughout their imaged courses. Review of the MIP images confirms the above findings. ---------------------------------------------------------------------------------- Nonvascular Findings: Mediastinum/Lymph Nodes: Scattered mediastinal and bilateral hilar lymph nodes are prominent though individually not enlarged by size criteria with index left-sided paratracheal lymph node measuring 0.9 cm in greatest short axis diameter (image 39, series 5, index right suprahilar lymph node measuring 0.7 cm (image 47, series 3) and left hilar lymph node measuring 0.7  cm (image 45, series 3), presumably reactive in etiology. No bulky mediastinal, hilar or axillary lymphadenopathy. Lungs/Pleura: Mucoid impaction in short-segment occlusion involving several bilateral lower lobe segmental and subsegmental bronchi (representative images 67, 84 and 90, series 5). These findings are associated with worsening bibasilar heterogeneous opacities. No associated air bronchograms. Redemonstrated advanced apical predominant centrilobular emphysematous change with mild progressive interseptal thickening. No pleural effusion or pneumothorax. No new or enlarging pulmonary nodules. Punctate (sub 5 mm) right upper lobe pulmonary nodules (images 31 and 35, series 5), are both unchanged since at least the 10/2017 examination and thus of benign etiology.  Upper abdomen: Limited early arterial phase evaluation the upper abdomen nodular thickening of the left adrenal gland similar to remote abdominal CT performed 01/2016 with imaging stability suggestive of a be

## 2021-07-26 NOTE — Progress Notes (Signed)
At 0230, pt able to void 450cc. Mild pain present still in L ribs/LUQ area. Hortencia Conradi RN  ?

## 2021-07-26 NOTE — Progress Notes (Signed)
Pt has been c/o pain under her left breast that has been unrelieved by heat packs and tramadol '100mg'$ . Paged on call MD and received order for 12 lead EKG and morphine IV. EKG shows NSR and other VS are WNL. Morphine given and pt reports pain is "not gone" but much better. She appears more comfortable. Will continue to monitor.  ?Also, pt voided at 2330 100cc and bladder scan PVR showed 660cc. Pt returned to BR to attempt void again, but the above mentioned pain was getting worse and she had to return to bed without voiding again. She is declining I&O cath at this time. We will try to treat the pain and get her more comfortable and see if she can void again shortly. Hortencia Conradi RN  ?

## 2021-07-27 DIAGNOSIS — R059 Cough, unspecified: Secondary | ICD-10-CM | POA: Diagnosis not present

## 2021-07-27 LAB — LEGIONELLA PNEUMOPHILA SEROGP 1 UR AG: L. pneumophila Serogp 1 Ur Ag: NEGATIVE

## 2021-07-27 MED ORDER — LEVOFLOXACIN 500 MG PO TABS: 500.0000 mg | ORAL_TABLET | Freq: Every day | ORAL | 0 refills | Status: AC

## 2021-07-27 NOTE — Progress Notes (Signed)
Patient unable to void, 240 ml per bladder scan.  Dr. Verlon Au made aware.  Patient refused catheterization at this time.  Per Dr. Verlon Au, hold discharge for several hours until patient voids.  Discharge pending at this time. ?

## 2021-07-27 NOTE — Discharge Summary (Signed)
Physician Discharge Summary  ?Heidi Hobbs NUU:[id number] DOB: 1941/01/26 DOA: 07/24/2021 ? ?PCP: Shon Baton, MD ? ?Admit date: 07/24/2021 ?Discharge date: 07/27/2021 ? ?Time spent: 27 minutes ? ?Recommendations for Outpatient Follow-up:  ?Needs Chem-12 CBC in about 1 week's time--was slightly hypokalemic this hospital stay ?Stop ibuprofen, start HCTZ-may be resumed at lower dosing according to her primary care physician ?Recommend in the outpatient setting interval follow-up with neurology/rheumatology for polymyalgia rheumatica ?Recommend in addition continuation and completion of Levaquin to treat both?  Bronchiectasis flare versus UTI ?Should follow-up with Dr. Fuller Plan (cc), urologist at [facility] ? ? ?Discharge Diagnoses:  ?MAIN problem for hospitalization  ? ?Bronchiectasis on admission with flare ?Urinary tract infection-recurrent with underlying rectocele ? ?Please see below for itemized issues addressed in HO[facility]- ?refer to other progress notes for clarity if needed ? ?Discharge Condition: Improved ? ?Diet recommendation: Heart healthy ? ?Filed Weights  ? 07/27/21 0600  ?Weight: 65.8 kg  ? ? ?History of present illness:  ?41 Y female with  ?polymyalgia rheumatica [nonresponsive 2 prednisone--?  Cervicogenic and follows with Dr. Tomi Likens neurology/Dr. Trudie Reed rheumatology-intolerant gabapentin] ?Prior C5-C6 and C6-C7 ACDF 2020 ?History of bronchiectasis, recurrent UTIs ?Had COVID 01/2021 was admitted for this and given standard therapies 3 doses remdesivir additionally ?Prior GI bleed?  With history of severe diverticulosis colonic polyps ?Known nonobstructive carotid stenosis ?  ?Developed chronic cough worsening over the past week fever 101-took inhalers did not help and also felt like having urine retention ?  ?Hospital-Problem based course ?  ??  Recurrent UTI-UA shows large leukocytes positive nitrites ?Underlying bronchiectasis ?Azithro/Ceftriaxone --->narrow to oral Levaquin- as white count has dropped ?No  cultures performed? ?Polymyalgia rheumatica (had 13 months of steroids which did not help) now on no therapy-followed by Dr. Trudie Reed, Dr. Tomi Likens for headaches ?Gabapentin was ineffective additionally ?Continue at this time tramadol 50-100 at bedtime, continue amitriptyline 100 at bedtime ?Hypokalemia on admission potassium 3.0 ?Improved-Daily check ?Reported rectocele when followed with GI Dr. Fuller Plan, urologist Dr. Clayborne Dana ?Prior diverticulosis colonic polyps ?Suggested to family to get a urogynecologist?  May benefit from prolapse surgery? ?Nursing reports patient still retains urine despite bedside urination-patient is reticent to have a Foley catheter placed ?Will need follow-up with [facility] urology Dr. Hanley Seamen to discuss options ?  ? ? ?Discharge Exam: ?Vitals:  ? 07/27/21 0441 07/27/21 0750  ?BP: (!) 160/69   ?Pulse: 84   ?Resp: 18   ?Temp: 98.7 ?F (37.1 ?C)   ?SpO2: 95% 96%  ? ? ?Subj on day of d/c ?  ?Awake coherent pleasant sitting up no distress ?EOMI NCAT ?Feels fair-eating and drinking but sparsely-was able to sit up at the bedside-off oxygen she does not desat ? ?General Exam on discharge ? ?EOMI NCAT no focal deficit no icterus no pallor no wheeze no rales no rhonchi ?Abdomen soft no rebound no guarding ?ROM intact ?Neuro is intact moving all 4 limbs equally ?No lower extremity edema ?Power is 5/5 ? ?Discharge Instructions ? ? ?Discharge Instructions   ? ? Diet - low sodium heart healthy   Complete by: As directed ?  ? Discharge instructions   Complete by: As directed ?  ? Patient will discharge with antibiotics which should be completed in the outpatient setting-I would strongly recommend follow-up with urologist or urogynecologist to determine the neck steps with the patient's rectocele ?Patient should also have follow-up with primary care physician in 1 week to obtain labs ?Would recommend that the patient stop one of her blood  pressure meds (hydrochlorothiazide) as well as ibuprofen as  she was slightly dehydrated-this can be resumed in the outpatient setting if needed--- use Tylenol for pain ? ?Best of luck have a good summer  ? Increase activity slowly   Complete by: As directed ?  ? ?  ? ?Allergies as of 07/27/2021   ? ?   Reactions  ? Eggs Or Egg-derived Products   ? Skin nodules  ? ?  ? ?  ?Medication List  ?  ? ?STOP taking these medications   ? ?hydrochlorothiazide 25 MG tablet ?Commonly known as: HYDRODIURIL ?  ?ibuprofen 200 MG tablet ?Commonly known as: ADVIL ?  ? ?  ? ?TAKE these medications   ? ?acetaminophen 500 MG tablet ?Commonly known as: TYLENOL ?Take 1,000 mg by mouth every 6 (six) hours as needed for fever. ?  ?albuterol 108 (90 Base) MCG/ACT inhaler ?Commonly known as: VENTOLIN HFA ?Inhale 2 puffs into the lungs every 4 (four) hours as needed for wheezing or shortness of breath. ?  ?ALPRAZolam 0.25 MG tablet ?Commonly known as: Duanne Moron ?Take 0.25 tablets by mouth at bedtime. And as needed up to 2 times a day ?  ?amitriptyline 100 MG tablet ?Commonly known as: ELAVIL ?Take 100 mg by mouth at bedtime. ?  ?atenolol 50 MG tablet ?Commonly known as: TENORMIN ?Take 50 mg by mouth daily. ?  ?atorvastatin 40 MG tablet ?Commonly known as: LIPITOR ?Take 40 mg by mouth every other day. At night ?  ?cholecalciferol 25 MCG (1000 UNIT) tablet ?Commonly known as: VITAMIN D3 ?Take 1,000 Units by mouth daily. ?  ?docusate sodium 100 MG capsule ?Commonly known as: COLACE ?You can buy this at any drug store, over the counter and follow package instructions ?What changed:  ?how much to take ?how to take this ?when to take this ?additional instructions ?  ?ezetimibe 10 MG tablet ?Commonly known as: ZETIA ?Take 10 mg by mouth at bedtime. ?  ?Flutter Devi ?1 Device by Does not apply route 2 (two) times daily. ?  ?hydrALAZINE 25 MG tablet ?Commonly known as: APRESOLINE ?Take 25 mg by mouth 2 (two) times daily. ?  ?levofloxacin 500 MG tablet ?Commonly known as: Levaquin ?Take 1 tablet (500 mg total) by  mouth daily for 4 days. ?  ?losartan 100 MG tablet ?Commonly known as: COZAAR ?Take 100 mg by mouth daily. ?  ?omeprazole 20 MG tablet ?Commonly known as: PRILOSEC OTC ?Take 1 tablet (20 mg total) by mouth daily before breakfast. ?What changed: when to take this ?  ?polyethylene glycol 17 g packet ?Commonly known as: MIRALAX / GLYCOLAX ?Take 17 g by mouth daily. ?What changed: when to take this ?  ?PRESCRIPTION MEDICATION ?Place 1 Dose into both eyes every 30 (thirty) days. Ranibizumab injection - not sure about dosage. ?  ?PreserVision AREDS 2 Caps ?Take 1 capsule by mouth daily. ?  ?Stiolto Respimat 2.5-2.5 MCG/ACT Aers ?Generic drug: Tiotropium Bromide-Olodaterol ?Inhale 2 puffs into the lungs daily. ?  ?SYSTANE COMPLETE OP ?Place 1 drop into both eyes daily as needed (dry eyes after injection). ?  ?traMADol 50 MG tablet ?Commonly known as: ULTRAM ?Take 50-100 mg by mouth at bedtime. ?  ?vitamin B-12 1000 MCG tablet ?Commonly known as: CYANOCOBALAMIN ?Take 1,000 mcg by mouth daily. ?  ?vitamin C 1000 MG tablet ?Take 1,000 mg by mouth daily. ?  ? ?  ? ?Allergies  ?Allergen Reactions  ? Eggs Or Egg-Derived Products   ?  Skin nodules  ? ? ? ? ?  The results of significant diagnostics from this hospitalization (including imaging, microbiology, ancillary and laboratory) are listed below for reference.   ? ?Significant Diagnostic Studies: ?DG Chest 2 View ? ?Result Date: 07/24/2021 ?CLINICAL DATA:  Cough and shortness of breath. EXAM: CHEST - 2 VIEW COMPARISON:  02/08/2021 FINDINGS: Lungs are adequately inflated with minimal scarring over the lateral right base. There is minimal opacification over the left base likely atelectasis although early infection is possible. Cardiomediastinal silhouette and remainder of the exam is unchanged. IMPRESSION: Minimal left base opacification likely atelectasis, although early infection is possible. Electronically Signed   By: Marin Olp M.D.   On: 07/24/2021 12:37  ? ?CT Angio Chest  Pulmonary Embolism (PE) W or WO Contrast ? ?Result Date: 07/24/2021 ?CLINICAL DATA:  Chest pain and shortness of breath. Pneumonia like symptoms for the past week. Productive cough. Evaluate for pulmonary embo

## 2021-07-27 NOTE — Progress Notes (Signed)
Patient is able to void 400 ml, yellow, clear urine.  Patient is clear for discharge.  Discharge instructions given to patient and her daughter. Encouraged to follow up with PCP and urology-urogyn as recommended by MD.  Both verbalized understanding.  No complaints, needs addressed. Discharged home. ?

## 2021-07-29 DIAGNOSIS — K5900 Constipation, unspecified: Secondary | ICD-10-CM | POA: Diagnosis not present

## 2021-07-29 DIAGNOSIS — N398 Other specified disorders of urinary system: Secondary | ICD-10-CM | POA: Diagnosis not present

## 2021-07-29 DIAGNOSIS — R339 Retention of urine, unspecified: Secondary | ICD-10-CM | POA: Diagnosis not present

## 2021-07-29 DIAGNOSIS — R1032 Left lower quadrant pain: Secondary | ICD-10-CM | POA: Diagnosis not present

## 2021-08-04 DIAGNOSIS — J449 Chronic obstructive pulmonary disease, unspecified: Secondary | ICD-10-CM | POA: Diagnosis not present

## 2021-08-04 DIAGNOSIS — J479 Bronchiectasis, uncomplicated: Secondary | ICD-10-CM | POA: Diagnosis not present

## 2021-08-04 DIAGNOSIS — K5909 Other constipation: Secondary | ICD-10-CM | POA: Diagnosis not present

## 2021-08-04 DIAGNOSIS — I10 Essential (primary) hypertension: Secondary | ICD-10-CM | POA: Diagnosis not present

## 2021-08-18 DIAGNOSIS — H353230 Exudative age-related macular degeneration, bilateral, stage unspecified: Secondary | ICD-10-CM | POA: Diagnosis not present

## 2021-08-18 DIAGNOSIS — M316 Other giant cell arteritis: Secondary | ICD-10-CM | POA: Diagnosis not present

## 2021-08-18 DIAGNOSIS — Z961 Presence of intraocular lens: Secondary | ICD-10-CM | POA: Diagnosis not present

## 2021-08-18 DIAGNOSIS — E785 Hyperlipidemia, unspecified: Secondary | ICD-10-CM | POA: Diagnosis not present

## 2021-08-18 DIAGNOSIS — M109 Gout, unspecified: Secondary | ICD-10-CM | POA: Diagnosis not present

## 2021-08-18 DIAGNOSIS — I10 Essential (primary) hypertension: Secondary | ICD-10-CM | POA: Diagnosis not present

## 2021-08-18 DIAGNOSIS — H04123 Dry eye syndrome of bilateral lacrimal glands: Secondary | ICD-10-CM | POA: Diagnosis not present

## 2021-08-18 DIAGNOSIS — D519 Vitamin B12 deficiency anemia, unspecified: Secondary | ICD-10-CM | POA: Diagnosis not present

## 2021-08-18 DIAGNOSIS — E559 Vitamin D deficiency, unspecified: Secondary | ICD-10-CM | POA: Diagnosis not present

## 2021-08-19 DIAGNOSIS — N398 Other specified disorders of urinary system: Secondary | ICD-10-CM | POA: Diagnosis not present

## 2021-08-19 DIAGNOSIS — K5900 Constipation, unspecified: Secondary | ICD-10-CM | POA: Diagnosis not present

## 2021-08-19 DIAGNOSIS — N390 Urinary tract infection, site not specified: Secondary | ICD-10-CM | POA: Diagnosis not present

## 2021-08-19 DIAGNOSIS — R339 Retention of urine, unspecified: Secondary | ICD-10-CM | POA: Diagnosis not present

## 2021-08-25 DIAGNOSIS — Z23 Encounter for immunization: Secondary | ICD-10-CM | POA: Diagnosis not present

## 2021-08-25 DIAGNOSIS — R82998 Other abnormal findings in urine: Secondary | ICD-10-CM | POA: Diagnosis not present

## 2021-08-25 DIAGNOSIS — Z0000 Encounter for general adult medical examination without abnormal findings: Secondary | ICD-10-CM | POA: Diagnosis not present

## 2021-08-25 DIAGNOSIS — N182 Chronic kidney disease, stage 2 (mild): Secondary | ICD-10-CM | POA: Diagnosis not present

## 2021-08-25 DIAGNOSIS — M315 Giant cell arteritis with polymyalgia rheumatica: Secondary | ICD-10-CM | POA: Diagnosis not present

## 2021-08-25 DIAGNOSIS — J449 Chronic obstructive pulmonary disease, unspecified: Secondary | ICD-10-CM | POA: Diagnosis not present

## 2021-10-01 DIAGNOSIS — H353230 Exudative age-related macular degeneration, bilateral, stage unspecified: Secondary | ICD-10-CM | POA: Diagnosis not present

## 2021-10-20 DIAGNOSIS — H53413 Scotoma involving central area, bilateral: Secondary | ICD-10-CM | POA: Diagnosis not present

## 2021-10-25 ENCOUNTER — Other Ambulatory Visit: Payer: Self-pay

## 2021-10-25 ENCOUNTER — Encounter (HOSPITAL_COMMUNITY): Payer: Self-pay | Admitting: Emergency Medicine

## 2021-10-25 ENCOUNTER — Emergency Department (HOSPITAL_COMMUNITY): Payer: Medicare Other

## 2021-10-25 ENCOUNTER — Observation Stay (HOSPITAL_COMMUNITY)
Admission: EM | Admit: 2021-10-25 | Discharge: 2021-10-27 | Disposition: A | Discharge status: Home / Self Care | Payer: Medicare Other | Attending: Internal Medicine | Admitting: Internal Medicine

## 2021-10-25 DIAGNOSIS — F419 Anxiety disorder, unspecified: Secondary | ICD-10-CM | POA: Diagnosis not present

## 2021-10-25 DIAGNOSIS — Z8542 Personal history of malignant neoplasm of other parts of uterus: Secondary | ICD-10-CM | POA: Insufficient documentation

## 2021-10-25 DIAGNOSIS — J471 Bronchiectasis with (acute) exacerbation: Secondary | ICD-10-CM | POA: Diagnosis not present

## 2021-10-25 DIAGNOSIS — R062 Wheezing: Secondary | ICD-10-CM | POA: Diagnosis not present

## 2021-10-25 DIAGNOSIS — U071 COVID-19: Secondary | ICD-10-CM | POA: Diagnosis not present

## 2021-10-25 DIAGNOSIS — J449 Chronic obstructive pulmonary disease, unspecified: Secondary | ICD-10-CM | POA: Diagnosis not present

## 2021-10-25 DIAGNOSIS — R0602 Shortness of breath: Secondary | ICD-10-CM | POA: Diagnosis not present

## 2021-10-25 DIAGNOSIS — E876 Hypokalemia: Secondary | ICD-10-CM | POA: Diagnosis not present

## 2021-10-25 DIAGNOSIS — R7989 Other specified abnormal findings of blood chemistry: Secondary | ICD-10-CM

## 2021-10-25 DIAGNOSIS — Z87891 Personal history of nicotine dependence: Secondary | ICD-10-CM | POA: Insufficient documentation

## 2021-10-25 DIAGNOSIS — R778 Other specified abnormalities of plasma proteins: Secondary | ICD-10-CM | POA: Insufficient documentation

## 2021-10-25 DIAGNOSIS — I10 Essential (primary) hypertension: Secondary | ICD-10-CM | POA: Diagnosis not present

## 2021-10-25 DIAGNOSIS — Z79899 Other long term (current) drug therapy: Secondary | ICD-10-CM | POA: Diagnosis not present

## 2021-10-25 DIAGNOSIS — M353 Polymyalgia rheumatica: Secondary | ICD-10-CM

## 2021-10-25 DIAGNOSIS — R197 Diarrhea, unspecified: Secondary | ICD-10-CM | POA: Insufficient documentation

## 2021-10-25 DIAGNOSIS — R6889 Other general symptoms and signs: Secondary | ICD-10-CM | POA: Diagnosis not present

## 2021-10-25 DIAGNOSIS — Z743 Need for continuous supervision: Secondary | ICD-10-CM | POA: Diagnosis not present

## 2021-10-25 LAB — BLOOD GAS, VENOUS
Acid-Base Excess: 7.1 mmol/L — ABNORMAL HIGH (ref 0.0–2.0)
Bicarbonate: 32 mmol/L — ABNORMAL HIGH (ref 20.0–28.0)
O2 Saturation: 70.8 %
Patient temperature: 37
pCO2, Ven: 45 mmHg (ref 44–60)
pH, Ven: 7.46 — ABNORMAL HIGH (ref 7.25–7.43)
pO2, Ven: 38 mmHg (ref 32–45)

## 2021-10-25 LAB — CBC
HCT: 35.4 % — ABNORMAL LOW (ref 36.0–46.0)
Hemoglobin: 11.9 g/dL — ABNORMAL LOW (ref 12.0–15.0)
MCH: 29.2 pg (ref 26.0–34.0)
MCHC: 33.6 g/dL (ref 30.0–36.0)
MCV: 86.8 fL (ref 80.0–100.0)
Platelets: 261 10*3/uL (ref 150–400)
RBC: 4.08 MIL/uL (ref 3.87–5.11)
RDW: 14.9 % (ref 11.5–15.5)
WBC: 7.5 10*3/uL (ref 4.0–10.5)
nRBC: 0 % (ref 0.0–0.2)

## 2021-10-25 LAB — COMPREHENSIVE METABOLIC PANEL
ALT: 11 U/L (ref 0–44)
AST: 16 U/L (ref 15–41)
Albumin: 3.5 g/dL (ref 3.5–5.0)
Alkaline Phosphatase: 53 U/L (ref 38–126)
Anion gap: 9 (ref 5–15)
BUN: 5 mg/dL — ABNORMAL LOW (ref 8–23)
CO2: 24 mmol/L (ref 22–32)
Calcium: 8.7 mg/dL — ABNORMAL LOW (ref 8.9–10.3)
Chloride: 101 mmol/L (ref 98–111)
Creatinine, Ser: 0.57 mg/dL (ref 0.44–1.00)
GFR, Estimated: >60 mL/min (ref >60)
Glucose, Bld: 109 mg/dL — ABNORMAL HIGH (ref 70–99)
Potassium: 2.9 mmol/L — ABNORMAL LOW (ref 3.5–5.1)
Sodium: 134 mmol/L — ABNORMAL LOW (ref 135–145)
Total Bilirubin: 0.5 mg/dL (ref 0.3–1.2)
Total Protein: 6.5 g/dL (ref 6.5–8.1)

## 2021-10-25 LAB — TROPONIN I (HIGH SENSITIVITY): Troponin I (High Sensitivity): 61 ng/L — ABNORMAL HIGH (ref <18)

## 2021-10-25 LAB — RESP PANEL BY RT-PCR (FLU A&B, COVID) ARPGX2
Influenza A by PCR: NEGATIVE
Influenza B by PCR: NEGATIVE
SARS Coronavirus 2 by RT PCR: POSITIVE — AB

## 2021-10-25 LAB — BRAIN NATRIURETIC PEPTIDE: B Natriuretic Peptide: 130.8 pg/mL — ABNORMAL HIGH (ref 0.0–100.0)

## 2021-10-25 MED ORDER — ASCORBIC ACID 500 MG PO TABS
1000.0000 mg | ORAL_TABLET | Freq: Every day | ORAL | Status: DC
  Administered 2021-10-26 – 2021-10-27 (×2): 1000 mg via ORAL
  Filled 2021-10-25 (×2): qty 2

## 2021-10-25 MED ORDER — METHYLPREDNISOLONE SODIUM SUCC 40 MG IJ SOLR
40.0000 mg | Freq: Every day | INTRAMUSCULAR | Status: DC
  Administered 2021-10-26: 40 mg via INTRAVENOUS
  Filled 2021-10-25 (×3): qty 1

## 2021-10-25 MED ORDER — IPRATROPIUM-ALBUTEROL 0.5-2.5 (3) MG/3ML IN SOLN
3.0000 mL | Freq: Once | RESPIRATORY_TRACT | Status: AC
Start: 2021-10-25 — End: 2021-10-25
  Administered 2021-10-25: 3 mL via RESPIRATORY_TRACT

## 2021-10-25 MED ORDER — HYDRALAZINE HCL 25 MG PO TABS
25.0000 mg | ORAL_TABLET | Freq: Once | ORAL | Status: AC
  Administered 2021-10-25: 25 mg via ORAL
  Filled 2021-10-25: qty 1

## 2021-10-25 MED ORDER — ALBUTEROL SULFATE (2.5 MG/3ML) 0.083% IN NEBU
5.0000 mg | INHALATION_SOLUTION | Freq: Once | RESPIRATORY_TRACT | Status: AC
Start: 2021-10-25 — End: 2021-10-25
  Administered 2021-10-25: 5 mg via RESPIRATORY_TRACT
  Filled 2021-10-25: qty 6

## 2021-10-25 MED ORDER — METHYLPREDNISOLONE SODIUM SUCC 125 MG IJ SOLR
125.0000 mg | Freq: Once | INTRAMUSCULAR | Status: AC
Start: 2021-10-25 — End: 2021-10-25
  Administered 2021-10-25: 125 mg via INTRAVENOUS
  Filled 2021-10-25: qty 2

## 2021-10-25 MED ORDER — HYDROCHLOROTHIAZIDE 12.5 MG PO TABS
12.5000 mg | ORAL_TABLET | Freq: Every day | ORAL | Status: DC
  Administered 2021-10-26 – 2021-10-27 (×2): 12.5 mg via ORAL
  Filled 2021-10-25 (×2): qty 1

## 2021-10-25 MED ORDER — DOCUSATE SODIUM 100 MG PO CAPS
100.0000 mg | ORAL_CAPSULE | Freq: Once | ORAL | Status: AC
  Administered 2021-10-25: 100 mg via ORAL
  Filled 2021-10-25: qty 1

## 2021-10-25 MED ORDER — IPRATROPIUM-ALBUTEROL 0.5-2.5 (3) MG/3ML IN SOLN
RESPIRATORY_TRACT | Status: AC
  Filled 2021-10-25: qty 3

## 2021-10-25 MED ORDER — MAGNESIUM SULFATE 50 % IJ SOLN
2.0000 g | Freq: Once | INTRAMUSCULAR | Status: DC
Start: 2021-10-25 — End: 2021-10-25

## 2021-10-25 MED ORDER — POLYETHYLENE GLYCOL 3350 17 G PO PACK: 17.0000 g | PACK | Freq: Every day | ORAL | Status: DC

## 2021-10-25 MED ORDER — POTASSIUM CHLORIDE CRYS ER 20 MEQ PO TBCR
40.0000 meq | EXTENDED_RELEASE_TABLET | Freq: Once | ORAL | Status: AC
  Administered 2021-10-25: 40 meq via ORAL
  Filled 2021-10-25: qty 2

## 2021-10-25 MED ORDER — EZETIMIBE 10 MG PO TABS
10.0000 mg | ORAL_TABLET | Freq: Every day | ORAL | Status: DC
  Administered 2021-10-25 – 2021-10-26 (×2): 10 mg via ORAL
  Filled 2021-10-25 (×3): qty 1

## 2021-10-25 MED ORDER — VITAMIN B-12 1000 MCG PO TABS
1000.0000 ug | ORAL_TABLET | Freq: Every day | ORAL | Status: DC
  Administered 2021-10-26 – 2021-10-27 (×2): 1000 ug via ORAL
  Filled 2021-10-25 (×2): qty 1

## 2021-10-25 MED ORDER — NIRMATRELVIR/RITONAVIR (PAXLOVID) TABLET (RENAL DOSING)
2.0000 | ORAL_TABLET | Freq: Two times a day (BID) | ORAL | Status: DC
  Administered 2021-10-26 – 2021-10-27 (×4): 2 via ORAL
  Filled 2021-10-25: qty 20

## 2021-10-25 MED ORDER — ATENOLOL 25 MG PO TABS
50.0000 mg | ORAL_TABLET | Freq: Every day | ORAL | Status: DC
  Administered 2021-10-26 – 2021-10-27 (×2): 50 mg via ORAL
  Filled 2021-10-25 (×2): qty 2

## 2021-10-25 MED ORDER — POTASSIUM CHLORIDE 10 MEQ/100ML IV SOLN
10.0000 meq | INTRAVENOUS | Status: AC
  Administered 2021-10-25 – 2021-10-26 (×2): 10 meq via INTRAVENOUS
  Filled 2021-10-25 (×2): qty 100

## 2021-10-25 MED ORDER — IPRATROPIUM-ALBUTEROL 0.5-2.5 (3) MG/3ML IN SOLN
3.0000 mL | Freq: Four times a day (QID) | RESPIRATORY_TRACT | Status: DC
Start: 2021-10-26 — End: 2021-10-26
  Administered 2021-10-26: 3 mL via RESPIRATORY_TRACT
  Filled 2021-10-25: qty 3

## 2021-10-25 MED ORDER — NIRMATRELVIR/RITONAVIR (PAXLOVID)TABLET: 3.0000 | ORAL_TABLET | Freq: Two times a day (BID) | ORAL | Status: DC

## 2021-10-25 MED ORDER — AMITRIPTYLINE HCL 25 MG PO TABS
100.0000 mg | ORAL_TABLET | Freq: Every day | ORAL | Status: DC
  Administered 2021-10-25 – 2021-10-26 (×2): 100 mg via ORAL
  Filled 2021-10-25 (×2): qty 4

## 2021-10-25 MED ORDER — ACETAMINOPHEN 500 MG PO TABS
1000.0000 mg | ORAL_TABLET | Freq: Once | ORAL | Status: AC
  Administered 2021-10-25: 1000 mg via ORAL
  Filled 2021-10-25: qty 2

## 2021-10-25 MED ORDER — MAGNESIUM SULFATE 2 GM/50ML IV SOLN
2.0000 g | Freq: Once | INTRAVENOUS | Status: AC
  Administered 2021-10-25: 2 g via INTRAVENOUS
  Filled 2021-10-25: qty 50

## 2021-10-25 MED ORDER — PANTOPRAZOLE SODIUM 20 MG PO TBEC
20.0000 mg | DELAYED_RELEASE_TABLET | Freq: Every day | ORAL | Status: DC
Start: 2021-10-26 — End: 2021-10-27
  Administered 2021-10-26 – 2021-10-27 (×2): 20 mg via ORAL
  Filled 2021-10-25 (×3): qty 1

## 2021-10-25 MED ORDER — ATORVASTATIN CALCIUM 40 MG PO TABS: 40.0000 mg | ORAL_TABLET | ORAL | Status: DC

## 2021-10-25 MED ORDER — LORAZEPAM 0.5 MG PO TABS
0.5000 mg | ORAL_TABLET | Freq: Once | ORAL | Status: AC
  Administered 2021-10-25: 0.5 mg via ORAL
  Filled 2021-10-25: qty 1

## 2021-10-25 MED ORDER — TRAMADOL HCL 50 MG PO TABS
50.0000 mg | ORAL_TABLET | Freq: Every day | ORAL | Status: DC
  Administered 2021-10-26: 50 mg via ORAL
  Filled 2021-10-25: qty 1

## 2021-10-25 MED ORDER — ALPRAZOLAM 0.25 MG PO TABS
0.2500 mg | ORAL_TABLET | Freq: Every day | ORAL | Status: DC
  Administered 2021-10-26 (×2): 0.25 mg via ORAL
  Filled 2021-10-25 (×2): qty 1

## 2021-10-25 MED ORDER — ASPIRIN 81 MG PO CHEW
324.0000 mg | CHEWABLE_TABLET | Freq: Once | ORAL | Status: AC
  Administered 2021-10-25: 324 mg via ORAL
  Filled 2021-10-25: qty 4

## 2021-10-25 MED ORDER — LOSARTAN POTASSIUM 50 MG PO TABS
100.0000 mg | ORAL_TABLET | Freq: Every day | ORAL | Status: DC
  Administered 2021-10-26 – 2021-10-27 (×2): 100 mg via ORAL
  Filled 2021-10-25 (×2): qty 2

## 2021-10-25 MED ORDER — VITAMIN D 25 MCG (1000 UNIT) PO TABS
1000.0000 [IU] | ORAL_TABLET | Freq: Every day | ORAL | Status: DC
  Administered 2021-10-26 – 2021-10-27 (×2): 1000 [IU] via ORAL
  Filled 2021-10-25 (×2): qty 1

## 2021-10-25 MED ORDER — HYDRALAZINE HCL 25 MG PO TABS
25.0000 mg | ORAL_TABLET | Freq: Two times a day (BID) | ORAL | Status: DC
  Administered 2021-10-26 – 2021-10-27 (×3): 25 mg via ORAL
  Filled 2021-10-25 (×3): qty 1

## 2021-10-25 NOTE — Assessment & Plan Note (Signed)
Secondary to COVID-19 viral infection -Start Paxlovid -Scheduled duoneb q6hr  -IV solu medrol '40mg'$  daily

## 2021-10-25 NOTE — Assessment & Plan Note (Signed)
Continue Tramadol and amitriptyline

## 2021-10-25 NOTE — ED Provider Notes (Signed)
[facility] DEPT Provider Note   CSN: [id number] Arrival date & time: 10/25/21  2022     History Chief Complaint  Patient presents with  . Covid Positive    HPI Heidi Hobbs is a 81 y.o. female presenting for shortness of breath.  Patient is a 81 year old female with a history of COPD, hypertension, hyperlipidemia presenting with a chief complaint of shortness of breath.  She endorses that she has had 3 days of progressive shortness of breath.  She initially thought it was her COPD flaring, unfortunately today she realized that her grandson was sick and patient tested positive for COVID.  Tonight she endorses frequent cough where she feels like she cannot catch her breath.  She used her breathing treatments without symptomatic improvement.  Patient's recorded medical, surgical, social, medication list and allergies were reviewed in the Snapshot window as part of the initial history.   Review of Systems   Review of Systems  Constitutional:  Negative for chills and fever.  HENT:  Negative for ear pain and sore throat.   Eyes:  Negative for pain and visual disturbance.  Respiratory:  Positive for cough, shortness of breath and wheezing.   Cardiovascular:  Negative for chest pain and palpitations.  Gastrointestinal:  Negative for abdominal pain and vomiting.  Genitourinary:  Negative for dysuria and hematuria.  Musculoskeletal:  Negative for arthralgias and back pain.  Skin:  Negative for color change and rash.  Neurological:  Negative for seizures and syncope.  All other systems reviewed and are negative.   Physical Exam Updated Vital Signs BP (!) 162/78   Pulse 85   Temp 98.7 F (37.1 C) (Oral)   Resp (!) 22   Ht '5\' 7"'$  (1.702 m)   Wt 59 kg   SpO2 98%   BMI 20.36 kg/m  Physical Exam Vitals and nursing note reviewed.  Constitutional:      General: She is not in acute distress.    Appearance: She is well-developed.  HENT:     Head:  Normocephalic and atraumatic.  Eyes:     Conjunctiva/sclera: Conjunctivae normal.  Cardiovascular:     Rate and Rhythm: Normal rate and regular rhythm.     Heart sounds: No murmur heard. Pulmonary:     Effort: Pulmonary effort is normal. No respiratory distress.     Breath sounds: Wheezing present.  Abdominal:     General: There is no distension.     Palpations: Abdomen is soft.     Tenderness: There is no abdominal tenderness. There is no right CVA tenderness or left CVA tenderness.  Musculoskeletal:        General: No swelling or tenderness. Normal range of motion.     Cervical back: Neck supple.  Skin:    General: Skin is warm and dry.  Neurological:     General: No focal deficit present.     Mental Status: She is alert and oriented to person, place, and time. Mental status is at baseline.     Cranial Nerves: No cranial nerve deficit.     ED Course/ Medical Decision Making/ A&P    Procedures Procedures   Medications Ordered in ED Medications  magnesium sulfate IVPB 2 g 50 mL (2 g Intravenous New Bag/Given 10/25/21 2117)  ipratropium-albuterol (DUONEB) 0.5-2.5 (3) MG/3ML nebulizer solution (  Not Given 10/25/21 2137)  acetaminophen (TYLENOL) tablet 1,000 mg (has no administration in time range)  methylPREDNISolone sodium succinate (SOLU-MEDROL) 125 mg/2 mL injection 125 mg (125  mg Intravenous Given 10/25/21 2117)  ipratropium-albuterol (DUONEB) 0.5-2.5 (3) MG/3ML nebulizer solution 3 mL (3 mLs Nebulization Given 10/25/21 2137)  albuterol (PROVENTIL) (2.5 MG/3ML) 0.083% nebulizer solution 5 mg (5 mg Nebulization Given 10/25/21 2148)    Medical Decision Making:    Heidi Hobbs is a 81 y.o. female who presented to the ED today with wheezing cough congestion detailed above.     Additional history discussed with patient's family/caregivers.  Patient's presentation is complicated by their history of multiple comorbid medical problems including COPD hypertension .  Patient  placed on continuous vitals and telemetry monitoring while in ED which was reviewed periodically.   Complete initial physical exam performed, notably the patient  was hemodynamically stable in no acute distress.      Reviewed and confirmed nursing documentation for past medical history, family history, social history.    Initial Assessment:   With the patient's presentation of wheezing, most likely diagnosis is COPD exacerbation prompted by her COVID infection. Other diagnoses were considered including (but not limited to) pneumonia, ACS, PE, pneumothorax. These are considered less likely due to history of present illness and physical exam findings.   This is most consistent with an acute life/limb threatening illness complicated by underlying chronic conditions.  Initial Plan:  We will treat patient COPD exacerbation with bronchodilators including albuterol, ipratropium, Solu-Medrol, magnesium IV infusion with plan for reassessment Screening labs including CBC and Metabolic panel to evaluate for infectious or metabolic etiology of disease.  Viral screening ordered CXR to evaluate for structural/infectious intrathoracic pathology.  Troponins/BNP/EKG to evaluate for cardiac pathology. Blood gas to evaluate for severity of patient's shortness of breath Objective evaluation as below reviewed with plan for close reassessment  Initial Study Results:   Laboratory  All laboratory results reviewed without evidence of clinically relevant pathology.   Exceptions include: Mild hypokalemia, mild troponin elevation, mild BNP elevation  EKG EKG was reviewed independently. Rate, rhythm, axis, intervals all examined and without medically relevant abnormality. ST segments without concerns for elevations.    Radiology  All images reviewed independently. Agree with radiology report at this time.   DG Chest Port 1 View  Result Date: 10/25/2021 CLINICAL DATA:  Shortness of breath EXAM: PORTABLE CHEST 1  VIEW COMPARISON:  Chest x-ray 07/24/2021 FINDINGS: The heart size and mediastinal contours are within normal limits. Both lungs are clear. Cervical spinal fusion plate is present. No acute fractures are seen. There are atherosclerotic calcifications of the aorta. IMPRESSION: No active disease. Electronically Signed   By: Ronney Asters M.D.   On: 10/25/2021 21:43     Final Assessment and Plan:   On repeat assessment, patient has had some mild improvement of her wheezing but continues to be intermittently short of breath.  Ordered patient's home meds and potassium replacement.  Given her ongoing COVID infection and dyspnea, I favor that patient will require close intervention and management overnight for management of her COPD.  Discussed with hospitalist who agreed with need for admission.  Patient admitted with no further acute events.   Disposition:   Based on the above findings, I believe this patient is stable for admission.    Patient/family educated about specific findings on our evaluation and explained exact reasons for admission.  Patient/family educated about clinical situation and time was allowed to answer questions.   Admission team communicated with and agreed with need for admission. Patient admitted. Patient ready to move at this time.     Emergency Department Medication Summary:  Medications  ipratropium-albuterol (DUONEB) 0.5-2.5 (3) MG/3ML nebulizer solution (  Not Given 10/25/21 2137)  potassium chloride SA (KLOR-CON M) CR tablet 40 mEq (has no administration in time range)  aspirin chewable tablet 324 mg (has no administration in time range)  methylPREDNISolone sodium succinate (SOLU-MEDROL) 125 mg/2 mL injection 125 mg (125 mg Intravenous Given 10/25/21 2117)  magnesium sulfate IVPB 2 g 50 mL (0 g Intravenous Stopped 10/25/21 2240)  ipratropium-albuterol (DUONEB) 0.5-2.5 (3) MG/3ML nebulizer solution 3 mL (3 mLs Nebulization Given 10/25/21 2137)  albuterol (PROVENTIL) (2.5  MG/3ML) 0.083% nebulizer solution 5 mg (5 mg Nebulization Given 10/25/21 2148)  acetaminophen (TYLENOL) tablet 1,000 mg (1,000 mg Oral Given 10/25/21 2239)  hydrALAZINE (APRESOLINE) tablet 25 mg (25 mg Oral Given 10/25/21 2243)  LORazepam (ATIVAN) tablet 0.5 mg (0.5 mg Oral Given 10/25/21 2243)         Clinical Impression: No diagnosis found.   Data Unavailable   Final Clinical Impression(s) / ED Diagnoses Final diagnoses:  None    Rx / DC Orders ED Discharge Orders     None         Tretha Sciara, MD 10/25/21 2317

## 2021-10-25 NOTE — Assessment & Plan Note (Signed)
Elevated.  She was given IV hydralazine 25 mg by EDP. -Continue home hydralazine, HCTZ, losartan, atenolol

## 2021-10-25 NOTE — Assessment & Plan Note (Signed)
Reports chronic diarrhea.  -she is on Miralax and docusate at home daily. Will discontinue.

## 2021-10-25 NOTE — Assessment & Plan Note (Addendum)
Likely due to demand in setting of COVID infection. Asymptomatic from cardiac standpoint -continue to trend overnight

## 2021-10-25 NOTE — ED Triage Notes (Signed)
Patient BIB [facility] c/o shob with expiratory wheezing unrelieved by breathing treatments.  Patient has hx of COPD. Patient has had close contact with someone with Covid and had positive home covid test.  Patients PCP sent medications to pharmacy but patient was worried about staying at home d/t previous experience with covid.  96% RA 99.5 162/82

## 2021-10-25 NOTE — Assessment & Plan Note (Signed)
K of 2.9. Repleted with IV 76mq x 2 and oral K

## 2021-10-25 NOTE — H&P (Addendum)
History and Physical    Patient: Heidi Hobbs ENI:[id number] DOB: 05-Oct-1940 DOA: 10/25/2021 DOS: the patient was seen and examined on 10/26/2021 PCP: Shon Baton, MD  Patient coming from: Home  Chief Complaint:  Chief Complaint  Patient presents with   Covid Positive   HPI: Heidi Hobbs is a 81 y.o. female with medical history significant of Polymyalgia rheumatica, hypertension, bronchiectasis, GI bleed, history of COVID 01/2021 presents with increase shortness of breath.  Symptoms of productive cough and increasing shortness of breath started yesterday. Tried to take albuterol and nebulizer at home but felt she had more difficulty breathing afterwards. Had sick contact with great grandchild. Very anxious. Previous hx of 52yr tobacco use but quit. No nausea, vomiting but has been having some chronic diarrhea.  In the ED , she was afebrile, tachypneic, mildly hypertensive up to SBP of 170s, 90% with ambulation.  Currently stable on room air.  COVID PCR positive.  Influenza PCR negative.  Chest x-ray is negative.  No leukocytosis.  Hemoglobin 11.9. Mild hyponatremia 134, hypokalemia of 2.9  She was given DuoNeb, albuterol nebulizer, Ativan, magnesium, IV Solu-Medrol, IV 25 hydralazine and hospitalist consulted for admission. Review of Systems: As mentioned in the history of present illness. All other systems reviewed and are negative. Past Medical History:  Diagnosis Date   Adenomatous colon polyp 1995, 2011   Anemia    Anxiety    [place]    Cancer ([place]    uterine cancer   Carotid stenosis    40-59% right, less than 40% left   Cataracts, bilateral    COPD (chronic obstructive pulmonary disease) ([place]    Dyspnea    Essential hypertension 02/13/2016   Hyperlipidemia    Hypertension    MAC (mycobacterium avium-intracellulare complex)    Macular degeneration, dry 02/13/2016   Polio 1959   Stomach ulcer 1960   Urinary tract infection    Vasculitis ([place]     Normocomplementemic urticarial    [place]culitis ([place] 02/13/2016   [facility]   Past Surgical History:  Procedure Laterality Date   ANTERIOR CERVICAL DECOMP/DISCECTOMY FUSION N/A 11/27/2018   Procedure: CERVICAL FIVE TO SIX , CERVICAL SIX TO SEVEN, ANTERIOR CERVICAL DECOMPRESSION/DISCECTOMY FUSION, ALLOGRAFT, PLATE;  Surgeon: YMarybelle Killings MD;  [place] [facility]  Service: Orthopedics;  Laterality: N/A;   APPENDECTOMY     rupture appendix   ARTERY BIOPSY N/A 08/17/2019   Procedure: BIOPSY TEMPORAL ARTERY;  Surgeon: TLeta Baptist MD;  [place] [facility]  Service: ENT;  Laterality: N/A;   BREAST ENHANCEMENT SURGERY     for inverted nipples   CATARACT EXTRACTION     COLONOSCOPY     COLONOSCOPY  10/14/2009   LAPAROSCOPIC APPENDECTOMY N/A 01/23/2016   Procedure: APPENDECTOMY LAPAROSCOPIC;  Surgeon: TErroll Luna MD;  [place] [facility] [place]  Service: General;  Laterality: N/A;   POLYPECTOMY  2011   x8   TONSILLECTOMY AND ADENOIDECTOMY     VAGINAL HYSTERECTOMY  1970   uterine cancer   Social History:  reports that she quit smoking about 4 years ago. Her smoking use included cigarettes. She has a 25.00 pack-year smoking history. She has never used smokeless tobacco. She reports that she does not drink alcohol and does not use drugs.  Allergies  Allergen Reactions   Eggs Or Egg-Derived Products     Skin nodules    Family History  Problem Relation Age of Onset   Colon cancer Father  Esophageal cancer Brother    Esophageal cancer Brother    Colon cancer Brother    Asthma Daughter        as a child   Stomach cancer Neg Hx    Rectal cancer Neg Hx     Prior to Admission medications   Medication Sig Start Date End Date Taking? Authorizing Provider  acetaminophen (TYLENOL) 500 MG tablet Take 1,000 mg by mouth every 6 (six) hours as needed for fever.    [provider]  albuterol (VENTOLIN HFA) 108 (90 Base) MCG/ACT inhaler Inhale 2 puffs into  the lungs every 4 (four) hours as needed for wheezing or shortness of breath. 01/06/21   Parrett, Fonnie Mu, NP  ALPRAZolam (XANAX) 0.25 MG tablet Take 0.25 tablets by mouth at bedtime. And as needed up to 2 times a day 07/22/17   [provider]  amitriptyline (ELAVIL) 100 MG tablet Take 100 mg by mouth at bedtime.     [provider]  Ascorbic Acid (VITAMIN C) 1000 MG tablet Take 1,000 mg by mouth daily.    [provider]  atenolol (TENORMIN) 50 MG tablet Take 50 mg by mouth daily.    [provider]  atorvastatin (LIPITOR) 40 MG tablet Take 40 mg by mouth every other day. At night    [provider]  cholecalciferol (VITAMIN D3) 25 MCG (1000 UT) tablet Take 1,000 Units by mouth daily.    [provider]  docusate sodium (COLACE) 100 MG capsule You can buy this at any drug store, over the counter and follow package instructions Patient taking differently: Take 100 mg by mouth at bedtime. 02/01/16   Earnstine Regal, PA-C  ezetimibe (ZETIA) 10 MG tablet Take 10 mg by mouth at bedtime.     [provider]  hydrALAZINE (APRESOLINE) 25 MG tablet Take 25 mg by mouth 2 (two) times daily. 02/02/21   [provider]  losartan (COZAAR) 100 MG tablet Take 100 mg by mouth daily.    [provider]  Multiple Vitamins-Minerals (PRESERVISION AREDS 2) CAPS Take 1 capsule by mouth daily.    [provider]  omeprazole (PRILOSEC OTC) 20 MG tablet Take 1 tablet (20 mg total) by mouth daily before breakfast. Patient taking differently: Take 20 mg by mouth daily. 01/29/20   Tanda Rockers, MD  PAXLOVID, 300/100, 20 x 150 MG & 10 x 100MG TBPK Take by mouth. 10/25/21   [provider]  polyethylene glycol (MIRALAX / GLYCOLAX) 17 g packet Take 17 g by mouth daily. Patient taking differently: Take 17 g by mouth at bedtime. 02/10/21   Shelly Coss, MD  predniSONE (DELTASONE) 20 MG tablet Take 20 mg by mouth 2 (two) times  daily. 10/25/21   [provider]  PRESCRIPTION MEDICATION Place 1 Dose into both eyes every 30 (thirty) days. Ranibizumab injection - not sure about dosage.    [provider]  Propylene Glycol (SYSTANE COMPLETE OP) Place 1 drop into both eyes daily as needed (dry eyes after injection).    [provider]  Respiratory Therapy Supplies (FLUTTER) DEVI 1 Device by Does not apply route 2 (two) times daily. 06/08/19   [name], NP  Tiotropium Bromide-Olodaterol (STIOLTO RESPIMAT) 2.5-2.5 MCG/ACT AERS Inhale 2 puffs into the lungs daily. 01/06/21   Parrett, Fonnie Mu, NP  traMADol (ULTRAM) 50 MG tablet Take 50-100 mg by mouth at bedtime. 02/27/19   [provider]  vitamin B-12 (CYANOCOBALAMIN) 1000 MCG tablet Take 1,000  mcg by mouth daily.    [provider]    Physical Exam: Vitals:   10/25/21 2245 10/25/21 2300 10/25/21 2315 10/26/21 0015  BP: (!) 168/70 (!) 167/75 (!) 144/82 (!) 163/73  Pulse: 99 96 98 100  Resp: (!) 22 20 (!) 21 18  Temp:    98.6 F (37 C)  TempSrc:    Oral  SpO2: 91% 94% 90% 94%  Weight:      Height:       Constitutional: NAD, calm, comfortable, mildly ill-appearing elderly female sitting upright in bed appearing anxious with tachypnea Eyes: lids and conjunctivae normal ENMT: Mucous membranes are moist.  Neck: normal, supple Respiratory: Diffuse expiratory wheezing throughout.  Tachypneic and anxious on room air.  No accessory muscle use.  Cardiovascular: Regular rate and rhythm, no murmurs / rubs / gallops. No extremity edema.  Abdomen: Soft, nondistended, no tenderness.  Bowel sounds positive.  Musculoskeletal: no clubbing / cyanosis. No joint deformity upper and lower extremities. Good ROM, no contractures. Normal muscle tone.  Skin: no rashes, lesions, ulcers.  Neurologic: CN 2-12 grossly intact.  Strength 5/5 in all 4.  Psychiatric: Normal judgment and insight. Alert and oriented x 3. Normal mood. Data  Reviewed:  See HPI  Assessment and Plan: * Bronchiectasis with (acute) exacerbation ([place]) Secondary to COVID-19 viral infection -Start Paxlovid -Scheduled duoneb q6hr  -IV solu medrol 67m daily  Essential hypertension Elevated.  She was given IV hydralazine 25 mg by EDP. -Continue home hydralazine, HCTZ, losartan, atenolol  Diarrhea Reports chronic diarrhea.  -she is on Miralax and docusate at home daily. Will discontinue.   Elevated troponin Likely due to demand in setting of COVID infection. Asymptomatic from cardiac standpoint -continue to trend overnight   Hypokalemia K of 2.9. Repleted with IV 144m x 2 and oral K  Anxiety Very anxious on exam.  Continue home Xanax.  Has received IV Ativan in the ED.  Polymyalgia rheumatica (HCC) Continue Tramadol and amitriptyline      Advance Care Planning:   Code Status: Full Code Full  Consults: none  Family Communication: Discussed with daughter who is her POA  Severity of Illness: The appropriate patient status for this patient is OBSERVATION. Observation status is judged to be reasonable and necessary in order to provide the required intensity of service to ensure the patient's safety. The patient's presenting symptoms, physical exam findings, and initial radiographic and laboratory data in the context of their medical condition is felt to place them at decreased risk for further clinical deterioration. Furthermore, it is anticipated that the patient will be medically stable for discharge from the hospital within 2 midnights of admission.   Author: ChOrene DesanctisDO 10/26/2021 12:48 AM  For on call review [email or web address]

## 2021-10-25 NOTE — Assessment & Plan Note (Signed)
Very anxious on exam.  Continue home Xanax.  Has received IV Ativan in the ED.

## 2021-10-26 DIAGNOSIS — U071 COVID-19: Secondary | ICD-10-CM | POA: Diagnosis not present

## 2021-10-26 DIAGNOSIS — R778 Other specified abnormalities of plasma proteins: Secondary | ICD-10-CM | POA: Diagnosis not present

## 2021-10-26 DIAGNOSIS — J471 Bronchiectasis with (acute) exacerbation: Secondary | ICD-10-CM | POA: Diagnosis not present

## 2021-10-26 LAB — BASIC METABOLIC PANEL
Anion gap: 10 (ref 5–15)
BUN: 5 mg/dL — ABNORMAL LOW (ref 8–23)
CO2: 24 mmol/L (ref 22–32)
Calcium: 8.6 mg/dL — ABNORMAL LOW (ref 8.9–10.3)
Chloride: 100 mmol/L (ref 98–111)
Creatinine, Ser: 0.6 mg/dL (ref 0.44–1.00)
GFR, Estimated: >60 mL/min (ref >60)
Glucose, Bld: 182 mg/dL — ABNORMAL HIGH (ref 70–99)
Potassium: 2.7 mmol/L — CL (ref 3.5–5.1)
Sodium: 134 mmol/L — ABNORMAL LOW (ref 135–145)

## 2021-10-26 LAB — TROPONIN I (HIGH SENSITIVITY)
Troponin I (High Sensitivity): 83 ng/L — ABNORMAL HIGH (ref <18)
Troponin I (High Sensitivity): 65 ng/L — ABNORMAL HIGH (ref <18)
Troponin I (High Sensitivity): 62 ng/L — ABNORMAL HIGH (ref <18)

## 2021-10-26 MED ORDER — IPRATROPIUM-ALBUTEROL 0.5-2.5 (3) MG/3ML IN SOLN
3.0000 mL | Freq: Three times a day (TID) | RESPIRATORY_TRACT | Status: DC
Start: 2021-10-26 — End: 2021-10-27
  Administered 2021-10-26 – 2021-10-27 (×4): 3 mL via RESPIRATORY_TRACT
  Filled 2021-10-26 (×4): qty 3

## 2021-10-26 MED ORDER — DOCUSATE SODIUM 100 MG PO CAPS: 100.0000 mg | ORAL_CAPSULE | Freq: Every evening | ORAL | Status: DC

## 2021-10-26 MED ORDER — PREDNISONE 20 MG PO TABS
40.0000 mg | ORAL_TABLET | Freq: Every day | ORAL | 0 refills | Status: AC
Start: 2021-10-26 — End: 2021-11-02

## 2021-10-26 MED ORDER — ENOXAPARIN SODIUM 40 MG/0.4ML IJ SOSY
40.0000 mg | PREFILLED_SYRINGE | INTRAMUSCULAR | Status: DC
  Administered 2021-10-26 – 2021-10-27 (×2): 40 mg via SUBCUTANEOUS
  Filled 2021-10-26 (×2): qty 0.4

## 2021-10-26 MED ORDER — NIRMATRELVIR/RITONAVIR (PAXLOVID) TABLET (RENAL DOSING): 2.0000 | ORAL_TABLET | Freq: Two times a day (BID) | ORAL | 0 refills | Status: AC

## 2021-10-26 MED ORDER — POTASSIUM CHLORIDE CRYS ER 20 MEQ PO TBCR
40.0000 meq | EXTENDED_RELEASE_TABLET | Freq: Once | ORAL | Status: AC
  Administered 2021-10-26: 40 meq via ORAL
  Filled 2021-10-26: qty 2

## 2021-10-26 MED ORDER — POLYETHYLENE GLYCOL 3350 17 G PO PACK: 17.0000 g | PACK | Freq: Every day | ORAL | Status: AC | PRN

## 2021-10-26 MED ORDER — ACETAMINOPHEN 325 MG PO TABS
650.0000 mg | ORAL_TABLET | Freq: Four times a day (QID) | ORAL | Status: DC | PRN
  Administered 2021-10-26: 650 mg via ORAL
  Filled 2021-10-26: qty 2

## 2021-10-26 MED ORDER — ENSURE ENLIVE PO LIQD
237.0000 mL | Freq: Two times a day (BID) | ORAL | Status: DC
  Administered 2021-10-26: 237 mL via ORAL

## 2021-10-26 MED ORDER — ALBUTEROL SULFATE (2.5 MG/3ML) 0.083% IN NEBU
2.5000 mg | INHALATION_SOLUTION | RESPIRATORY_TRACT | Status: DC | PRN
  Administered 2021-10-26 – 2021-10-27 (×2): 2.5 mg via RESPIRATORY_TRACT
  Filled 2021-10-26 (×2): qty 3

## 2021-10-26 MED ORDER — POTASSIUM CHLORIDE 10 MEQ/100ML IV SOLN
10.0000 meq | INTRAVENOUS | Status: AC
  Administered 2021-10-26 (×4): 10 meq via INTRAVENOUS
  Filled 2021-10-26 (×4): qty 100

## 2021-10-26 NOTE — Discharge Summary (Signed)
Physician Discharge Summary  Heidi Hobbs ZJQ:[id number] DOB: 03/24/1940 DOA: 10/25/2021  PCP: Shon Baton, MD  Admit date: 10/25/2021 Discharge date: 10/26/2021  Admitted From: Home Disposition: Home  Recommendations for Outpatient Follow-up:  Follow up with PCP in 1 week with repeat CBC/BMP Outpatient follow-up with pulmonary Follow up in ED if symptoms worsen or new appear   Home Health: No Equipment/Devices: None  Discharge Condition: Stable CODE STATUS: Full Diet recommendation: Heart healthy  Brief/Interim Summary: 81 y.o. female with medical history significant of Polymyalgia rheumatica, hypertension, bronchiectasis, GI bleed, history of COVID 01/2021 presented with worsening shortness of breath and cough.  She was admitted for bronchiectasis exacerbation secondary to COVID-19 and started on Paxlovid and IV steroids.  Subsequently, her condition has improved.  She feels much better and feels okay to go home.  She will be discharged home today on oral prednisone 40 mg daily for 7 days and she will need to complete the course of her oral Paxlovid.  Will need to remain in isolation for total of 10 days.  Outpatient follow-up with PCP and pulmonary.  Discharge Diagnoses:   Acute exacerbation of bronchiectasis COVID-19 viral infection -Chest negative for acute infiltrates on presentation.  Treated with Paxlovid and Solu-Medrol. -She feels much better and feels okay to go home.  She will be discharged home today on oral prednisone 40 mg daily for 7 days and she will need to complete the course of her oral Paxlovid.  Will need to remain in isolation for total of 10 days.  Outpatient follow-up with PCP and pulmonary.  Essential hypertension -Continue home regimen.  Outpatient follow-up with PCP  Diarrhea -Reports chronic diarrhea.  Change MiraLAX to as needed.  DC docusate.  Outpatient follow-up with PCP  Elevated troponin -Possibly from demand ischemia from COVID-19 infection.   No chest pain.  Troponins did not trend upwards.  Outpatient follow-up.  Hypokalemia -Replace prior to discharge.  Anxiety -Continue home regimen  Polymyalgia rheumatica -Continue tramadol and amitriptyline.  Discharge Instructions  Discharge Instructions     Ambulatory referral to Pulmonology   Complete by: As directed    Reason for referral: Asthma/COPD   Diet - low sodium heart healthy   Complete by: As directed    Increase activity slowly   Complete by: As directed       Allergies as of 10/26/2021       Reactions   Eggs Or Egg-derived Products    Skin nodules        Medication List     STOP taking these medications    docusate sodium 100 MG capsule Commonly known as: COLACE   doxycycline 100 MG capsule Commonly known as: VIBRAMYCIN   Paxlovid (300/100) 20 x 150 MG & 10 x '100MG'$  Tbpk Generic drug: nirmatrelvir & ritonavir Replaced by: nirmatrelvir/ritonavir EUA (renal dosing) 10 x 150 MG & 10 x '100MG'$  Tabs       TAKE these medications    acetaminophen 500 MG tablet Commonly known as: TYLENOL Take 1,000 mg by mouth every 6 (six) hours as needed for fever.   albuterol 108 (90 Base) MCG/ACT inhaler Commonly known as: VENTOLIN HFA Inhale 2 puffs into the lungs every 4 (four) hours as needed for wheezing or shortness of breath.   ALPRAZolam 0.25 MG tablet Commonly known as: XANAX Take 0.25 tablets by mouth at bedtime. And as needed up to 2 times a day   amitriptyline 100 MG tablet Commonly known as: ELAVIL Take 100 mg by mouth  at bedtime.   atenolol 50 MG tablet Commonly known as: TENORMIN Take 50 mg by mouth daily.   atorvastatin 40 MG tablet Commonly known as: LIPITOR Take 40 mg by mouth every other day. At night   cholecalciferol 25 MCG (1000 UNIT) tablet Commonly known as: VITAMIN D3 Take 1,000 Units by mouth daily.   cyanocobalamin 1000 MCG tablet Commonly known as: VITAMIN B12 Take 1,000 mcg by mouth daily.   ezetimibe 10 MG  tablet Commonly known as: ZETIA Take 10 mg by mouth at bedtime.   Flutter Devi 1 Device by Does not apply route 2 (two) times daily.   hydrALAZINE 25 MG tablet Commonly known as: APRESOLINE Take 25 mg by mouth 2 (two) times daily.   hydrochlorothiazide 25 MG tablet Commonly known as: HYDRODIURIL Take 12.5 mg by mouth daily.   losartan 100 MG tablet Commonly known as: COZAAR Take 100 mg by mouth daily.   nirmatrelvir/ritonavir EUA (renal dosing) 10 x 150 MG & 10 x '100MG'$  Tabs Commonly known as: PAXLOVID Take 2 tablets by mouth 2 (two) times daily for 5 days. Take nirmatrelvir (150 mg) one tablet twice daily for 5 days and ritonavir (100 mg) one tablet twice daily for 5 days. Replaces: Paxlovid (300/100) 20 x 150 MG & 10 x '100MG'$  Tbpk   omeprazole 20 MG tablet Commonly known as: PRILOSEC OTC Take 1 tablet (20 mg total) by mouth daily before breakfast. What changed: when to take this   polyethylene glycol 17 g packet Commonly known as: MIRALAX / GLYCOLAX Take 17 g by mouth daily as needed for moderate constipation. What changed:  when to take this reasons to take this   predniSONE 20 MG tablet Commonly known as: DELTASONE Take 2 tablets (40 mg total) by mouth daily at 6 (six) AM for 7 days. What changed:  how much to take when to take this   [place] 1 Dose into both eyes every 30 (thirty) days. Ranibizumab injection - not sure about dosage.   Stiolto Respimat 2.5-2.5 MCG/ACT Aers Generic drug: Tiotropium Bromide-Olodaterol Inhale 2 puffs into the lungs daily.   SYSTANE COMPLETE OP Place 1 drop into both eyes daily as needed (dry eyes after injection).   traMADol 50 MG tablet Commonly known as: ULTRAM Take 50-100 mg by mouth at bedtime.   vitamin C 1000 MG tablet Take 1,000 mg by mouth daily.          Allergies  Allergen Reactions   Eggs Or Egg-Derived Products     Skin nodules    Consultations: None   Procedures/Studies: DG  Chest Port 1 View  Result Date: 10/25/2021 CLINICAL DATA:  Shortness of breath EXAM: PORTABLE CHEST 1 VIEW COMPARISON:  Chest x-ray 07/24/2021 FINDINGS: The heart size and mediastinal contours are within normal limits. Both lungs are clear. Cervical spinal fusion plate is present. No acute fractures are seen. There are atherosclerotic calcifications of the aorta. IMPRESSION: No active disease. Electronically Signed   By: Ronney Asters M.D.   On: 10/25/2021 21:43      Subjective: Patient seen and examined at bedside.  Breathing feels better and wants to go home today.  Denies fever, vomiting, chest pain.  Discharge Exam: Vitals:   10/26/21 0829 10/26/21 0831  BP:  (!) 157/74  Pulse:  (!) 102  Resp:  16  Temp:  98.6 F (37 C)  SpO2: 95% 96%    General: Pt is alert, awake, not in acute distress.  Currently on room air.  Elderly female lying in bed. Cardiovascular: Intermittently tachycardic; S1/S2 + Respiratory: bilateral decreased breath sounds at bases with some scattered crackles Abdominal: Soft, NT, ND, bowel sounds + Extremities: Trace lower extremity edema; no cyanosis    The results of significant diagnostics from this hospitalization (including imaging, microbiology, ancillary and laboratory) are listed below for reference.     Microbiology: Recent Results (from the past 240 hour(s))  Resp Panel by RT-PCR (Flu A&B, Covid) Anterior Nasal Swab     Status: Abnormal   Collection Time: 10/25/21  8:55 PM   Specimen: Anterior Nasal Swab  Result Value Ref Range Status   SARS Coronavirus 2 by RT PCR POSITIVE (A) NEGATIVE Final    Comment: (NOTE) SARS-CoV-2 target nucleic acids are DETECTED.  The SARS-CoV-2 RNA is generally detectable in upper respiratory specimens during the acute phase of infection. Positive results are indicative of the presence of the identified virus, but do not rule out bacterial infection or co-infection with other pathogens not detected by the test.  Clinical correlation with patient history and other diagnostic information is necessary to determine patient infection status. The expected result is Negative.  Fact Sheet for Patients: EntrepreneurPulse.com.au  Fact Sheet for Healthcare Providers: IncredibleEmployment.be  This test is not yet approved or cleared by the [place] FDA and  has been authorized for detection and/or diagnosis of SARS-CoV-2 by FDA under an Emergency Use Authorization (EUA).  This EUA will remain in effect (meaning this test can be used) for the duration of  the COVID-19 declaration under Section 564(b)(1) of the A ct, 21 U.S.C. section 360bbb-3(b)(1), unless the authorization is terminated or revoked sooner.     Influenza A by PCR NEGATIVE NEGATIVE Final   Influenza B by PCR NEGATIVE NEGATIVE Final    Comment: (NOTE) The Xpert Xpress SARS-CoV-2/FLU/RSV plus assay is intended as an aid in the diagnosis of influenza from Nasopharyngeal swab specimens and should not be used as a sole basis for treatment. Nasal washings and aspirates are unacceptable for Xpert Xpress SARS-CoV-2/FLU/RSV testing.  Fact Sheet for Patients: EntrepreneurPulse.com.au  Fact Sheet for Healthcare Providers: IncredibleEmployment.be  This test is not yet approved or cleared by the [place] FDA and has been authorized for detection and/or diagnosis of SARS-CoV-2 by FDA under an Emergency Use Authorization (EUA). This EUA will remain in effect (meaning this test can be used) for the duration of the COVID-19 declaration under Section 564(b)(1) of the Act, 21 U.S.C. section 360bbb-3(b)(1), unless the authorization is terminated or revoked.  Performed at [facility], [place] [address], [place] [employer], [place] [address]      Labs: BNP (last 3 results) Recent Labs    07/24/21 1250 10/25/21 2055  BNP 115.1* 527.7*   Basic Metabolic  Panel: Recent Labs  Lab 10/25/21 2055 10/26/21 0102  NA 134* 134*  K 2.9* 2.7*  CL 101 100  CO2 24 24  GLUCOSE 109* 182*  BUN 5* 5*  CREATININE 0.57 0.60  CALCIUM 8.7* 8.6*   Liver Function Tests: Recent Labs  Lab 10/25/21 2055  AST 16  ALT 11  ALKPHOS 53  BILITOT 0.5  PROT 6.5  ALBUMIN 3.5   No results for input(s): "LIPASE", "AMYLASE" in the last 168 hours. No results for input(s): "AMMONIA" in the last 168 hours. CBC: Recent Labs  Lab 10/25/21 2055  WBC 7.5  HGB 11.9*  HCT 35.4*  MCV 86.8  PLT 261   Cardiac Enzymes: No results for input(s): "CKTOTAL", "CKMB", "CKMBINDEX", "TROPONINI" in  the last 168 hours. BNP: Invalid input(s): "POCBNP" CBG: No results for input(s): "GLUCAP" in the last 168 hours. D-Dimer No results for input(s): "DDIMER" in the last 72 hours. Hgb A1c No results for input(s): "HGBA1C" in the last 72 hours. Lipid Profile No results for input(s): "CHOL", "HDL", "LDLCALC", "TRIG", "CHOLHDL", "LDLDIRECT" in the last 72 hours. Thyroid function studies No results for input(s): "TSH", "T4TOTAL", "T3FREE", "THYROIDAB" in the last 72 hours.  Invalid input(s): "FREET3" Anemia work up No results for input(s): "VITAMINB12", "FOLATE", "FERRITIN", "TIBC", "IRON", "RETICCTPCT" in the last 72 hours. Urinalysis    Component Value Date/Time   COLORURINE YELLOW 07/24/2021 1345   APPEARANCEUR HAZY (A) 07/24/2021 1345   LABSPEC 1.004 (L) 07/24/2021 1345   PHURINE 7.0 07/24/2021 1345   GLUCOSEU NEGATIVE 07/24/2021 1345   HGBUR SMALL (A) 07/24/2021 1345   BILIRUBINUR NEGATIVE 07/24/2021 1345   KETONESUR NEGATIVE 07/24/2021 1345   PROTEINUR NEGATIVE 07/24/2021 1345   UROBILINOGEN 0.2 06/22/2011 1100   NITRITE POSITIVE (A) 07/24/2021 1345   LEUKOCYTESUR LARGE (A) 07/24/2021 1345   Sepsis Labs Recent Labs  Lab 10/25/21 2055  WBC 7.5   Microbiology Recent Results (from the past 240 hour(s))  Resp Panel by RT-PCR (Flu A&B, Covid) Anterior Nasal  Swab     Status: Abnormal   Collection Time: 10/25/21  8:55 PM   Specimen: Anterior Nasal Swab  Result Value Ref Range Status   SARS Coronavirus 2 by RT PCR POSITIVE (A) NEGATIVE Final    Comment: (NOTE) SARS-CoV-2 target nucleic acids are DETECTED.  The SARS-CoV-2 RNA is generally detectable in upper respiratory specimens during the acute phase of infection. Positive results are indicative of the presence of the identified virus, but do not rule out bacterial infection or co-infection with other pathogens not detected by the test. Clinical correlation with patient history and other diagnostic information is necessary to determine patient infection status. The expected result is Negative.  Fact Sheet for Patients: EntrepreneurPulse.com.au  Fact Sheet for Healthcare Providers: IncredibleEmployment.be  This test is not yet approved or cleared by the [place] FDA and  has been authorized for detection and/or diagnosis of SARS-CoV-2 by FDA under an Emergency Use Authorization (EUA).  This EUA will remain in effect (meaning this test can be used) for the duration of  the COVID-19 declaration under Section 564(b)(1) of the A ct, 21 U.S.C. section 360bbb-3(b)(1), unless the authorization is terminated or revoked sooner.     Influenza A by PCR NEGATIVE NEGATIVE Final   Influenza B by PCR NEGATIVE NEGATIVE Final    Comment: (NOTE) The Xpert Xpress SARS-CoV-2/FLU/RSV plus assay is intended as an aid in the diagnosis of influenza from Nasopharyngeal swab specimens and should not be used as a sole basis for treatment. Nasal washings and aspirates are unacceptable for Xpert Xpress SARS-CoV-2/FLU/RSV testing.  Fact Sheet for Patients: EntrepreneurPulse.com.au  Fact Sheet for Healthcare Providers: IncredibleEmployment.be  This test is not yet approved or cleared by the [place] FDA and has been authorized  for detection and/or diagnosis of SARS-CoV-2 by FDA under an Emergency Use Authorization (EUA). This EUA will remain in effect (meaning this test can be used) for the duration of the COVID-19 declaration under Section 564(b)(1) of the Act, 21 U.S.C. section 360bbb-3(b)(1), unless the authorization is terminated or revoked.  Performed at [facility], [place] [address] [place], [place] [address]      Time coordinating discharge: 35 minutes  SIGNED:   Aline August, MD  [facility]  10/26/2021, 11:07 AM

## 2021-10-26 NOTE — Evaluation (Signed)
Physical Therapy Evaluation Patient Details Name: Heidi Hobbs MRN: [id number] DOB: Oct 02, 1940 Today's Date: 10/26/2021  History of Present Illness  81 yo female admitted with COVID. Hx of COPD, COVID, polymyalgia rheumatica, GI bleed, macular degeneration, ACDF  Clinical Impression  On eval, pt was Min guard for mobility. She walked ~85 feet with a RW. LOB x 1 during session. O2 92% on RA, dyspnea 2/4 with ambulation. Pt was fatigued after walk. She is mildly unsteady. Plan is for d/c home once medically stable. Pt politely declines PT f/u after discharge. Will plan to follow pt during this hospital stay.      Recommendations for follow up therapy are one component of a multi-disciplinary discharge planning process, led by the attending physician.  Recommendations may be updated based on patient status, additional functional criteria and insurance authorization.  Follow Up Recommendations No PT follow up      Assistance Recommended at Discharge PRN  Patient can return home with the following  Assist for transportation;Assistance with cooking/housework;Help with stairs or ramp for entrance    Equipment Recommendations None recommended by PT  Recommendations for Other Services       Functional Status Assessment Patient has had a recent decline in their functional status and demonstrates the ability to make significant improvements in function in a reasonable and predictable amount of time.     Precautions / Restrictions        Mobility  Bed Mobility Overal bed mobility: Modified Independent             General bed mobility comments: increased time. HOB elevated    Transfers Overall transfer level: Needs assistance Equipment used: Rolling walker (2 wheels) Transfers: Sit to/from Stand Sit to Stand: Supervision           General transfer comment: Supv,cues for safety.    Ambulation/Gait Ambulation/Gait assistance: Min guard; Min assist Gait Distance (Feet):  85 Feet Assistive device: Rolling walker (2 wheels) Gait Pattern/deviations: Step-through pattern, Decreased stride length       General Gait Details: LOB x 1-pt attempted to correct (small amount of assist given by therapist to steady as well). O2 92% on RA, dyspnea 2/4. Pt fatigued after walk.  Stairs            Wheelchair Mobility    Modified Rankin (Stroke Patients Only)       Balance Overall balance assessment: Needs assistance         Standing balance support: Bilateral upper extremity supported, Reliant on assistive device for balance, During functional activity Standing balance-Leahy Scale: Fair                               Pertinent Vitals/Pain Pain Assessment Pain Assessment: No/denies pain    Home Living Family/patient expects to be discharged to:: Private residence Living Arrangements: Children Available Help at Discharge: Family;Available PRN/intermittently Type of Home: House Home Access: Ramped entrance       Home Layout: Able to live on main level with bedroom/bathroom Home Equipment: Rolling Walker (2 wheels);Cane - single point;Tub bench Additional Comments: adjustable bed    Prior Function Prior Level of Function : Independent/Modified Independent             Mobility Comments: uses walker outside of home. no device inside home ADLs Comments: daughter assists with bathing     Hand Dominance        Extremity/Trunk Assessment   Upper Extremity  Assessment Upper Extremity Assessment: Defer to OT evaluation    Lower Extremity Assessment Lower Extremity Assessment: Generalized weakness    Cervical / Trunk Assessment Cervical / Trunk Assessment: Normal  Communication   Communication: HOH  Cognition Arousal/Alertness: Awake/alert Behavior During Therapy: WFL for tasks assessed/performed Overall Cognitive Status: Within Functional Limits for tasks assessed                                           General Comments      Exercises     Assessment/Plan    PT Assessment Patient needs continued PT services  PT Problem List Decreased strength;Decreased mobility;Decreased activity tolerance;Decreased balance;Decreased knowledge of use of DME       PT Treatment Interventions DME instruction;Gait training;Therapeutic activities;Therapeutic exercise;Patient/family education;Balance training;Functional mobility training    PT Goals (Current goals can be found in the Care Plan section)  Acute Rehab PT Goals Patient Stated Goal: home soon PT Goal Formulation: With patient Time For Goal Achievement: 11/09/21 Potential to Achieve Goals: Good    Frequency Min 3X/week     Co-evaluation               AM-PAC PT "6 Clicks" Mobility  Outcome Measure Help needed turning from your back to your side while in a flat bed without using bedrails?: None Help needed moving from lying on your back to sitting on the side of a flat bed without using bedrails?: None Help needed moving to and from a bed to a chair (including a wheelchair)?: A Little Help needed standing up from a chair using your arms (e.g., wheelchair or bedside chair)?: A Little Help needed to walk in hospital room?: A Little Help needed climbing 3-5 steps with a railing? : A Little 6 Click Score: 20    End of Session Equipment Utilized During Treatment: Gait belt Activity Tolerance: Patient limited by fatigue Patient left: in bed;with call bell/phone within reach   PT Visit Diagnosis: Unsteadiness on feet (R26.81);Muscle weakness (generalized) (M62.81)    Time: 2334-3568 PT Time Calculation (min) (ACUTE ONLY): 15 min   Charges:   PT Evaluation $PT Eval Low Complexity: [place], PT Acute Rehabilitation  Office: [phone] Pager: [phone]

## 2021-10-26 NOTE — Progress Notes (Signed)
  Transition of Care [facility]) Screening Note   Patient Details  Name: Heidi Hobbs Date of Birth: [birth year]   Transition of Care [facility]) CM/SW Contact:    Vassie Moselle, LCSW Phone Number: 10/26/2021, 11:25 AM    Transition of Care Department [facility]) has reviewed patient and no TOC needs have been identified at this time. We will continue to monitor patient advancement through interdisciplinary progression rounds. If new patient transition needs arise, please place a TOC consult.

## 2021-10-27 DIAGNOSIS — U071 COVID-19: Secondary | ICD-10-CM | POA: Diagnosis not present

## 2021-10-27 NOTE — Progress Notes (Signed)
Pt complained of SOB. Breath sounds exp wheezing and diminished. Administered albuterol SVN.

## 2021-10-27 NOTE — Progress Notes (Signed)
Patient was supposed to be discharged on 10/26/2021 but patient did not feel ready subsequently because of worsening shortness of breath.  Patient feels much better this morning and feels ready to go home today.  Had mild shortness of breath this morning.  Patient seen and examined at bedside and plan of care discussed with her.  Please refer to the full discharge summary done by me on 10/26/2021 for full details.  Patient will be discharged home today.

## 2021-10-27 NOTE — Discharge Instructions (Signed)

## 2021-11-03 DIAGNOSIS — H532 Diplopia: Secondary | ICD-10-CM | POA: Diagnosis not present

## 2021-11-03 DIAGNOSIS — H353133 Nonexudative age-related macular degeneration, bilateral, advanced atrophic without subfoveal involvement: Secondary | ICD-10-CM | POA: Diagnosis not present

## 2021-11-05 DIAGNOSIS — N390 Urinary tract infection, site not specified: Secondary | ICD-10-CM | POA: Diagnosis not present

## 2021-11-10 ENCOUNTER — Encounter: Payer: Self-pay | Admitting: Adult Health

## 2021-11-10 ENCOUNTER — Ambulatory Visit (INDEPENDENT_AMBULATORY_CARE_PROVIDER_SITE_OTHER): Payer: Medicare Other | Admitting: Adult Health

## 2021-11-10 DIAGNOSIS — B370 Candidal stomatitis: Secondary | ICD-10-CM

## 2021-11-10 DIAGNOSIS — J471 Bronchiectasis with (acute) exacerbation: Secondary | ICD-10-CM | POA: Diagnosis not present

## 2021-11-10 DIAGNOSIS — J449 Chronic obstructive pulmonary disease, unspecified: Secondary | ICD-10-CM

## 2021-11-10 DIAGNOSIS — U099 Post covid-19 condition, unspecified: Secondary | ICD-10-CM

## 2021-11-10 DIAGNOSIS — H53413 Scotoma involving central area, bilateral: Secondary | ICD-10-CM | POA: Diagnosis not present

## 2021-11-10 MED ORDER — CLOTRIMAZOLE 10 MG MT TROC: 10.0000 mg | Freq: Every day | OROMUCOSAL | 0 refills | Status: DC

## 2021-11-10 NOTE — Assessment & Plan Note (Signed)
COPD with emphysema-recent layer with hospitalization for COVID-19.  Symptoms seem to be improving and getting back to near baseline.  Would continue on Stiolto.  Plan  Patient Instructions  Continue on Stiolto 2 puffs daily  Claritin '10mg'$  At bedtime  for 1 week and then As needed   Saline nasal rinses Twice daily   Saline nasal gel At bedtime  .  Flutter valve Three times a day   Albuterol neb or inhaler .As needed   Fluids and rest  Salt water gargles As needed   Mycelex troche five times a day for 1 week.  Activity as tolerated.  Follow up with Dr. Melvyn Novas or Khia Dieterich NP   6- 8 weeks and As needed   Please contact office for sooner follow up if symptoms do not improve or worsen or seek emergency care

## 2021-11-10 NOTE — Assessment & Plan Note (Signed)
Recent exacerbation with COVID-19 infection.  Patient appears to be clinically improving.  Continue with aggressive mucociliary clearance regimen. Chest x-ray with no evidence of pneumonia.  Plan  Patient Instructions  Continue on Stiolto 2 puffs daily  Claritin '10mg'$  At bedtime  for 1 week and then As needed   Saline nasal rinses Twice daily   Saline nasal gel At bedtime  .  Flutter valve Three times a day   Albuterol neb or inhaler .As needed   Fluids and rest  Salt water gargles As needed   Mycelex troche five times a day for 1 week.  Activity as tolerated.  Follow up with Dr. Melvyn Novas or Ketrina Boateng NP   6- 8 weeks and As needed   Please contact office for sooner follow up if symptoms do not improve or worsen or seek emergency care

## 2021-11-10 NOTE — Progress Notes (Signed)
_0  ID: Heidi Hobbs, female    DOB: 17-Dec-1940, 81 y.o.   MRN: [id number]  Chief Complaint  Patient presents with   Hospitalization Follow-up    Referring provider: Shon Baton, MD  HPI: 81 year old female former smoker followed for COPD and bronchiectasis. Medical history significant for polymyalgia rheumatica previously on steroids, previous exposure to chlorine gas in 1980s, previously on Macrodantin for UTIs, stopped in 2019  TEST/EVENTS :  12/20/2018-ANA-negative 12/20/2018-sed rate-22 1014/2020-CBC with differential-eosinophils absolute 0.1, eosinophils relative 1 11/27/2018-chest x-ray-emphysema, no acute disease   08/31/2017-pulmonary function test-FVC 2.95 (95% predicted), postbronchodilator ratio 68, postbronchodilator FEV1 2.23 (95% predicted) no bronchodilator response, mid flow reversibility, DLCO 16.75 (57% predicted)   10/14/2017-CT chest high-res-moderate centrilobular emphysema and diffuse bronchial wall thickening, no acute consolidative airspace disease or lung masses, scattered mild cylindrical varicoid bronchiectasis throughout both lungs predominantly in bilateral upper lobes and right middle lobe with the associated mild patchy tree-in-bud opacities and scattered mucoid impaction, 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  11/10/2021 Follow up : COPD and bronchiectasis, posthospital follow-up.  COVID-19 infection Patient presents for a posthospital follow-up.  Patient was admitted earlier this month for an acute exacerbation of bronchiectasis and COVID-19 infection.  She was treated with Paxlovid and IV steroids.  She was discharged on a  prednisone taper.  Chest x-ray showed clear lungs. Since discharge patient is feeling better.  She has decreased cough congestion.  Still feels fatigued with low energy.  Cough still has some thick mucus.  But does seem to be slowly improving.  Did have some mucus mixed with blood but that has resolved none in the last week.  Denies any fever, chest pain, orthopnea.  No nausea vomiting. She remains on Stiolto.  Patient says she has stopped this the last few days because she is got a sore throat.    Allergies  Allergen Reactions   Eggs Or Egg-Derived Products     Skin nodules    Immunization History  Administered Date(s) Administered   Fluad Quad(high Dose 65+) 12/12/2020   Influenza Split 02/27/2010, 03/15/2010, 12/28/2011, 05/29/2013, 12/04/2013   Influenza Whole 12/14/2011, 12/13/2016   Influenza, High Dose Seasonal PF 11/21/2017   Influenza, Quadrivalent, Recombinant, Inj, Pf 11/18/2017   Influenza,inj,Quad PF,6+ Mos 01/02/2015   PFIZER(Purple Top)SARS-COV-2 Vaccination 04/23/2019, 05/21/2019   Pneumococcal Conjugate-13 06/27/2014   Pneumococcal Polysaccharide-23 09/03/2002, 05/29/2013   Tdap 03/28/2009, 10/16/2011    Past Medical History:  Diagnosis Date   Adenomatous colon polyp 1995, 2011   Anemia    Anxiety    Bronchiectasis ([place])    Cancer ([place])    uterine cancer   Carotid stenosis    40-59% right, less than 40% left   Cataracts, bilateral    COPD (chronic obstructive pulmonary disease) ([place])    Dyspnea    Essential hypertension 02/13/2016   Hyperlipidemia    Hypertension    MAC (mycobacterium avium-intracellulare complex)    Macular degeneration, dry 02/13/2016   Polio 1959   Stomach ulcer 1960   Urinary tract infection    Vasculitis ([place])    Normocomplementemic urticarial    Vasculitis ([place]) 02/13/2016   Follow[facility] by [facility]     Tobacco History: Social History   Tobacco Use  Smoking Status Former   Packs/day: 0.50   Years: 50.00   Total  pack years: 25.00   Types: Cigarettes   Quit date: 08/01/2017   Years since quitting: 4.2  Smokeless Tobacco Never   Counseling given: Not Answered   Outpatient Medications Prior to Visit  Medication Sig Dispense Refill   acetaminophen (TYLENOL) 500 MG tablet Take 1,000 mg by mouth every 6 (six) hours as needed for fever.     albuterol (VENTOLIN HFA) 108 (90 Base) MCG/ACT inhaler Inhale 2 puffs into the lungs every 4 (four) hours as needed for wheezing or shortness of breath. 18 g 2   ALPRAZolam (XANAX) 0.25 MG tablet Take 0.25 tablets by mouth at bedtime. And as needed up to 2 times a day  1   amitriptyline (ELAVIL) 100 MG tablet Take 100 mg by mouth at bedtime.      Ascorbic Acid (VITAMIN C) 1000 MG tablet Take 1,000 mg by mouth daily.     atenolol (TENORMIN) 50 MG tablet Take 50 mg by mouth daily.     atorvastatin (LIPITOR) 40 MG tablet Take 40 mg by mouth every other day. At night     cholecalciferol (VITAMIN D3) 25 MCG (1000 UT) tablet Take 1,000 Units by mouth daily.     ezetimibe (ZETIA) 10 MG tablet Take 10 mg by mouth at bedtime.      hydrALAZINE (APRESOLINE) 25 MG tablet Take 25 mg by mouth 2 (two) times daily.     hydrochlorothiazide (HYDRODIURIL) 25 MG tablet Take 12.5 mg by mouth daily.     losartan (COZAAR) 100 MG tablet Take 100 mg by mouth daily.     nitrofurantoin, macrocrystal-monohydrate, (MACROBID) 100 MG capsule Take 100 mg by mouth 2 (two) times daily.     omeprazole (PRILOSEC OTC) 20 MG tablet Take 1 tablet (20 mg total) by mouth daily before breakfast. (Patient taking differently: Take 20 mg by mouth daily.) 30 tablet 3   polyethylene glycol (MIRALAX / GLYCOLAX) 17 g packet Take 17 g by mouth daily as needed for moderate constipation.     PRESCRIPTION MEDICATION Place 1 Dose into both eyes every 30 (thirty) days. Ranibizumab injection - not sure about dosage.     Propylene Glycol (SYSTANE COMPLETE OP) Place 1 drop into both eyes daily as needed (dry eyes after  injection).     Respiratory Therapy Supplies (FLUTTER) DEVI 1 Device by Does not apply route 2 (two) times daily. 1 each 0   traMADol (ULTRAM) 50 MG tablet Take 50-100 mg by mouth at bedtime.     vitamin B-12 (CYANOCOBALAMIN) 1000 MCG tablet Take 1,000 mcg by mouth daily.     Tiotropium Bromide-Olodaterol (STIOLTO RESPIMAT) 2.5-2.5 MCG/ACT AERS Inhale 2 puffs into the lungs daily. (Patient not taking: Reported on 11/10/2021) 1 each 5   No facility-administered medications prior to visit.     Review of Systems:   Constitutional:   No  weight loss, night sweats,  Fevers, chills,  {+fatigue, or  lassitude.  HEENT:   No headaches,  Difficulty swallowing,  Tooth/dental problems, or   +Sore throat,                No sneezing, itching, ear ache,  +nasal congestion, post nasal drip,   CV:  No chest pain,  Orthopnea, PND, swelling in lower extremities, anasarca, dizziness, palpitations, syncope.   GI  No heartburn, indigestion, abdominal pain, nausea, vomiting, diarrhea, change in bowel habits, loss of appetite, bloody stools.   Resp:  No chest wall deformity  Skin: no rash or lesions.  GU: no dysuria, change in color of urine, no urgency or frequency.  No flank pain, no hematuria   MS:  No joint pain or swelling.  No decreased range of motion.  No back pain.    Physical Exam  BP (!) 144/70 (BP Location: Left Arm, Patient Position: Sitting, Cuff Size: Normal)   Pulse 81   Temp 97.9 F (36.6 C) (Oral)   Ht _0  (1.727 m)   Wt 138 lb (62.6 kg)   SpO2 96%   BMI 20.98 kg/m   GEN: A/Ox3; pleasant , NAD, elderly   HEENT:  [place]/AT,  NOSE-clear, THROAT-clear, no lesions, no postnasal drip or exudate noted.   NECK:  Supple w/ fair ROM; no JVD; normal carotid impulses w/o bruits; no thyromegaly or nodules palpated; no lymphadenopathy.    RESP  Clear  P & A; w/o, wheezes/ rales/ or rhonchi. no accessory muscle use, no dullness to percussion  CARD:  RRR, no m/r/g, no peripheral edema,  pulses intact, no cyanosis or clubbing.  GI:   Soft & nt; nml bowel sounds; no organomegaly or masses detected.   Musco: Warm bil, no deformities or joint swelling noted.   Neuro: alert, no focal deficits noted.    Skin: Warm, no lesions or rashes    Lab Results:    BNP  ProBNP No results found for: "PROBNP"  Imaging: DG Chest Port 1 View  Result Date: 10/25/2021 CLINICAL DATA:  Shortness of breath EXAM: PORTABLE CHEST 1 VIEW COMPARISON:  Chest x-ray 07/24/2021 FINDINGS: The heart size and mediastinal contours are within normal limits. Both lungs are clear. Cervical spinal fusion plate is present. No acute fractures are seen. There are atherosclerotic calcifications of the aorta. IMPRESSION: No active disease. Electronically Signed   By: Ronney Asters M.D.   On: 10/25/2021 21:43         Latest Ref Rng & Units 01/06/2021   12:45 PM 08/31/2017    2:52 PM  PFT Results  FVC-Pre L 2.54  2.95   FVC-Predicted Pre % 85  95   FVC-Post L 2.84  3.27   FVC-Predicted Post % 95  105   Pre FEV1/FVC % % 57  71   Post FEV1/FCV % % 57  68   FEV1-Pre L 1.45  2.09   FEV1-Predicted Pre % 65  90   FEV1-Post L 1.61  2.23   DLCO uncorrected ml/min/mmHg 11.89  16.75   DLCO UNC% % 56  57   DLCO corrected ml/min/mmHg 11.89    DLCO COR %Predicted % 56    DLVA Predicted % 68  66   TLC L 6.30  6.53   TLC % Predicted % 113  117   RV % Predicted % 138  136     No results found for: "NITRICOXIDE"      Assessment & Plan:   No problem-specific Assessment & Plan notes found for this encounter.     Rexene Edison, NP 11/10/2021

## 2021-11-10 NOTE — Patient Instructions (Signed)
Continue on Stiolto 2 puffs daily  Claritin '10mg'$  At bedtime  for 1 week and then As needed   Saline nasal rinses Twice daily   Saline nasal gel At bedtime  .  Flutter valve Three times a day   Albuterol neb or inhaler .As needed   Fluids and rest  Salt water gargles As needed   Mycelex troche five times a day for 1 week.  Activity as tolerated.  Follow up with Dr. Melvyn Novas or Nakiyah Beverley NP   6- 8 weeks and As needed   Please contact office for sooner follow up if symptoms do not improve or worsen or seek emergency care

## 2021-11-10 NOTE — Assessment & Plan Note (Signed)
Sore throat /questionable stomatitis-with Candida.  Will treat with Mycelex atrocious x1 week.  Salt water gargles as needed.  Inhaler oral care education given

## 2021-11-11 DIAGNOSIS — J449 Chronic obstructive pulmonary disease, unspecified: Secondary | ICD-10-CM | POA: Diagnosis not present

## 2021-11-11 DIAGNOSIS — I129 Hypertensive chronic kidney disease with stage 1 through stage 4 chronic kidney disease, or unspecified chronic kidney disease: Secondary | ICD-10-CM | POA: Diagnosis not present

## 2021-11-11 DIAGNOSIS — N182 Chronic kidney disease, stage 2 (mild): Secondary | ICD-10-CM | POA: Diagnosis not present

## 2021-11-11 DIAGNOSIS — J479 Bronchiectasis, uncomplicated: Secondary | ICD-10-CM | POA: Diagnosis not present

## 2021-11-12 DIAGNOSIS — H353231 Exudative age-related macular degeneration, bilateral, with active choroidal neovascularization: Secondary | ICD-10-CM | POA: Diagnosis not present

## 2021-11-12 DIAGNOSIS — H3589 Other specified retinal disorders: Secondary | ICD-10-CM | POA: Diagnosis not present

## 2021-11-12 DIAGNOSIS — H35361 Drusen (degenerative) of macula, right eye: Secondary | ICD-10-CM | POA: Diagnosis not present

## 2021-11-23 DIAGNOSIS — K5900 Constipation, unspecified: Secondary | ICD-10-CM | POA: Diagnosis not present

## 2021-11-23 DIAGNOSIS — R339 Retention of urine, unspecified: Secondary | ICD-10-CM | POA: Diagnosis not present

## 2021-11-23 DIAGNOSIS — N398 Other specified disorders of urinary system: Secondary | ICD-10-CM | POA: Diagnosis not present

## 2021-11-23 DIAGNOSIS — N390 Urinary tract infection, site not specified: Secondary | ICD-10-CM | POA: Diagnosis not present

## 2021-11-24 DIAGNOSIS — H532 Diplopia: Secondary | ICD-10-CM | POA: Diagnosis not present

## 2021-12-10 DIAGNOSIS — L821 Other seborrheic keratosis: Secondary | ICD-10-CM | POA: Diagnosis not present

## 2021-12-10 DIAGNOSIS — L300 Nummular dermatitis: Secondary | ICD-10-CM | POA: Diagnosis not present

## 2021-12-24 DIAGNOSIS — Z961 Presence of intraocular lens: Secondary | ICD-10-CM | POA: Diagnosis not present

## 2021-12-24 DIAGNOSIS — H353230 Exudative age-related macular degeneration, bilateral, stage unspecified: Secondary | ICD-10-CM | POA: Diagnosis not present

## 2021-12-24 DIAGNOSIS — H04123 Dry eye syndrome of bilateral lacrimal glands: Secondary | ICD-10-CM | POA: Diagnosis not present

## 2021-12-29 ENCOUNTER — Ambulatory Visit (INDEPENDENT_AMBULATORY_CARE_PROVIDER_SITE_OTHER): Payer: Medicare Other | Admitting: Adult Health

## 2021-12-29 ENCOUNTER — Encounter: Payer: Self-pay | Admitting: Adult Health

## 2021-12-29 DIAGNOSIS — J479 Bronchiectasis, uncomplicated: Secondary | ICD-10-CM

## 2021-12-29 DIAGNOSIS — J449 Chronic obstructive pulmonary disease, unspecified: Secondary | ICD-10-CM

## 2021-12-29 NOTE — Progress Notes (Signed)
_0  ID: Heidi Hobbs, female    DOB: 1940-10-09, 81 y.o.   MRN: [id number]  Chief Complaint  Patient presents with   Follow-up    Referring provider: Shon Baton, MD  HPI: 81 year old female former smoker followed for COPD and bronchiectasis Medical history significant for polymyalgia rheumatica previously on steroids, previous exposure to chlorine gas in 1980s and previously on Macrodantin for UTIs, stopped in 2019  TEST/EVENTS :  12/20/2018-ANA-negative 12/20/2018-sed rate-22 1014/2020-CBC with differential-eosinophils absolute 0.1, eosinophils relative 1 11/27/2018-chest x-ray-emphysema, no acute disease   08/31/2017-pulmonary function test-FVC 2.95 (95% predicted), postbronchodilator ratio 68, postbronchodilator FEV1 2.23 (95% predicted) no bronchodilator response, mid flow reversibility, DLCO 16.75 (57% predicted)   10/14/2017-CT chest high-res-moderate centrilobular emphysema and diffuse bronchial wall thickening, no acute consolidative airspace disease or lung masses, scattered mild cylindrical varicoid bronchiectasis throughout both lungs predominantly in bilateral upper lobes and right middle lobe with the associated mild patchy tree-in-bud opacities and scattered mucoid impaction, 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  Hospitalization August 2023 with COVID-19 infection and bronchiectatic exacerbation.  Treated with Paxlovid and IV steroids  12/29/2021 Follow up: COPD and bronchiectasis Presents for a 39-monthfollow-up.  Patient has underlying COPD and bronchiectasis.  Says overall she is doing okay.  She was admitted in August 2023 with COVID-19  infection.  She required antiviral therapy with Paxlovid and IV steroids.  She says she is feeling all better.  Back to baseline.  She remains on Stiolto daily.  Feels that overall her breathing is doing fairly well.  She had no flare of her cough or congestion.  Activity level is picking back up.  She is accompanied by her daughter who helps with her care.  She is planning on getting her flu shot later this week with her daughter. She does need to check with her primary care provider on her most recent pneumonia vaccine. Postnasal drainage is under control . Has Claritin if she needs it.  Allergies  Allergen Reactions   Eggs Or Egg-Derived Products     Skin nodules    Immunization History  Administered Date(s) Administered   Fluad Quad(high Dose 65+) 12/12/2020   Influenza Split 02/27/2010, 03/15/2010, 12/28/2011, 05/29/2013, 12/04/2013   Influenza Whole 12/14/2011, 12/13/2016   Influenza, High Dose Seasonal PF 11/21/2017   Influenza, Quadrivalent, Recombinant, Inj, Pf 11/18/2017   Influenza,inj,Quad PF,6+ Mos 01/02/2015   PFIZER(Purple Top)SARS-COV-2 Vaccination 04/23/2019, 05/21/2019   Pneumococcal Conjugate-13 06/27/2014   Pneumococcal Polysaccharide-23 09/03/2002, 05/29/2013   Tdap 03/28/2009, 10/16/2011    Past Medical History:  Diagnosis Date   Adenomatous colon polyp 1995, 2011   Anemia    Anxiety    [place]chiectasis [place]    [place]cer [place]    uterine cancer   Carotid stenosis    40-59% right, less than 40% left   Cataracts, bilateral    COPD [place]chronic obstructive [place] disease) [place]    Dyspnea    Essential hypertension 02/13/2016   Hyperlipidemia    Hypertension    MAC (mycobacterium avium-intracellulare complex)    Macular degeneration, dry 02/13/2016   Polio 1959   Stomach ulcer 1960   Urinary tract infection    [place]culitis [place]    Normocomplementemic urticarial    [place]asculitis ([place] 02/13/2016  [facility]     Tobacco History: Social  History   Tobacco Use  Smoking Status Former   Packs/day: 0.50   Years: 50.00   Total pack years: 25.00   Types: Cigarettes   Quit date: 08/01/2017   Years since quitting: 4.4  Smokeless Tobacco Never   Counseling given: Not Answered   Outpatient Medications Prior to Visit  Medication Sig Dispense Refill   acetaminophen (TYLENOL) 500 MG tablet Take 1,000 mg by mouth every 6 (six) hours as needed for fever.     albuterol (VENTOLIN HFA) 108 (90 Base) MCG/ACT inhaler Inhale 2 puffs into the lungs every 4 (four) hours as needed for wheezing or shortness of breath. 18 g 2   ALPRAZolam (XANAX) 0.25 MG tablet Take 0.25 tablets by mouth at bedtime. And as needed up to 2 times a day  1   amitriptyline (ELAVIL) 100 MG tablet Take 100 mg by mouth at bedtime.      Ascorbic Acid (VITAMIN C) 1000 MG tablet Take 1,000 mg by mouth daily.     atenolol (TENORMIN) 50 MG tablet Take 50 mg by mouth daily.     atorvastatin (LIPITOR) 40 MG tablet Take 40 mg by mouth every other day. At night     cholecalciferol (VITAMIN D3) 25 MCG (1000 UT) tablet Take 1,000 Units by mouth daily.     ezetimibe (ZETIA) 10 MG tablet Take 10 mg by mouth at bedtime.      hydrALAZINE (APRESOLINE) 25 MG tablet Take 25 mg by mouth 2 (two) times daily.     hydrochlorothiazide (HYDRODIURIL) 25 MG tablet Take 12.5 mg by mouth daily.     losartan (COZAAR) 100 MG tablet Take 100 mg by mouth daily.     methenamine (HIPREX) 1 g tablet Take 1 g by mouth 2 (two) times daily.     omeprazole (PRILOSEC OTC) 20 MG tablet Take 1 tablet (20 mg total) by mouth daily before breakfast. (Patient taking differently: Take 20 mg by mouth daily.) 30 tablet 3   polyethylene glycol (MIRALAX / GLYCOLAX) 17 g packet Take 17 g by mouth daily as needed for moderate constipation.     PRESCRIPTION MEDICATION Place 1 Dose into both eyes every 30 (thirty) days. Ranibizumab injection - not sure about dosage.     Propylene Glycol (SYSTANE COMPLETE OP) Place 1 drop  into both eyes daily as needed (dry eyes after injection).     Respiratory Therapy Supplies (FLUTTER) DEVI 1 Device by Does not apply route 2 (two) times daily. 1 each 0   Tiotropium Bromide-Olodaterol (STIOLTO RESPIMAT) 2.5-2.5 MCG/ACT AERS Inhale 2 puffs into the lungs daily. 1 each 5   traMADol (ULTRAM) 50 MG tablet Take 50-100 mg by mouth at bedtime.     vitamin B-12 (CYANOCOBALAMIN) 1000 MCG tablet Take 1,000 mcg by mouth daily.     clotrimazole (MYCELEX) 10 MG troche Take 1 tablet (10 mg total) by mouth 5 (five) times daily. 35 Troche 0   nitrofurantoin, macrocrystal-monohydrate, (MACROBID) 100 MG capsule Take 100 mg by mouth 2 (two) times daily.     No facility-administered medications prior to visit.     Review of Systems:   Constitutional:   No  weight loss, night sweats,  Fevers, chills, fatigue, or  lassitude.  HEENT:   No headaches,  Difficulty swallowing,  Tooth/dental problems, or  Sore throat,                No sneezing, itching,  ear ache,  +nasal congestion, post nasal drip,   CV:  No chest pain,  Orthopnea, PND, swelling in lower extremities, anasarca, dizziness, palpitations, syncope.   GI  No heartburn, indigestion, abdominal pain, nausea, vomiting, diarrhea, change in bowel habits, loss of appetite, bloody stools.   Resp:  No chest wall deformity  Skin: no rash or lesions.  GU: no dysuria, change in color of urine, no urgency or frequency.  No flank pain, no hematuria   MS:  No joint pain or swelling.  No decreased range of motion.  No back pain.    Physical Exam  BP 138/70 (BP Location: Left Arm, Cuff Size: Normal)   Pulse 98   Temp 98 F (36.7 C) (Oral)   Ht _0  (1.727 m)   Wt 136 lb 12.8 oz (62.1 kg)   SpO2 98%   BMI 20.80 kg/m   GEN: A/Ox3; pleasant , NAD, well nourished    HEENT:  [place]/AT,  NOSE-clear, THROAT-clear, no lesions, no postnasal drip or exudate noted.   NECK:  Supple w/ fair ROM; no JVD; normal carotid impulses w/o bruits; no  thyromegaly or nodules palpated; no lymphadenopathy.    RESP  Clear  P & A; w/o, wheezes/ rales/ or rhonchi. no accessory muscle use, no dullness to percussion  CARD:  RRR, no m/r/g, no peripheral edema, pulses intact, no cyanosis or clubbing.  GI:   Soft & nt; nml bowel sounds; no organomegaly or masses detected.   Musco: Warm bil, no deformities or joint swelling noted.   Neuro: alert, no focal deficits noted.    Skin: Warm, no lesions or rashes    Lab Results:     ProBNP No results found for: "PROBNP"  Imaging: No results found.       Latest Ref Rng & Units 01/06/2021   12:45 PM 08/31/2017    2:52 PM  PFT Results  FVC-Pre L 2.54  2.95   FVC-Predicted Pre % 85  95   FVC-Post L 2.84  3.27   FVC-Predicted Post % 95  105   Pre FEV1/FVC % % 57  71   Post FEV1/FCV % % 57  68   FEV1-Pre L 1.45  2.09   FEV1-Predicted Pre % 65  90   FEV1-Post L 1.61  2.23   DLCO uncorrected ml/min/mmHg 11.89  16.75   DLCO UNC% % 56  57   DLCO corrected ml/min/mmHg 11.89    DLCO COR %Predicted % 56    DLVA Predicted % 68  66   TLC L 6.30  6.53   TLC % Predicted % 113  117   RV % Predicted % 138  136     No results found for: "NITRICOXIDE"      Assessment & Plan:   Bronchiectasis without acute exacerbation (HCC) Under control.  Encouraged on mucociliary clearance.  Use flutter at least once or twice daily.  Plan  Patient Instructions  Continue on Stiolto 2 puffs daily, rinse after use.  Claritin 63m At bedtime  for 1 week and then As needed   Saline nasal rinses Twice daily   Saline nasal gel At bedtime  .  Flutter valve Three times a day   Albuterol neb or inhaler .As needed   Activity as tolerated.  Flu shot this week as planned.  RSV vaccine later this Fall.  Follow up with Dr. WMelvyn Novasor Gursimran Litaker NP in 3-4 months and As needed   Please contact office for sooner follow up  if symptoms do not improve or worsen or seek emergency care          COPD GOLD 1 Under  control.  Continue on Stiolto daily  Plan  Patient Instructions  Continue on Stiolto 2 puffs daily, rinse after use.  Claritin 44m At bedtime  for 1 week and then As needed   Saline nasal rinses Twice daily   Saline nasal gel At bedtime  .  Flutter valve Three times a day   Albuterol neb or inhaler .As needed   Activity as tolerated.  Flu shot this week as planned.  RSV vaccine later this Fall.  Follow up with Dr. WMelvyn Novasor Jerriah Ines NP in 3-4 months and As needed   Please contact office for sooner follow up if symptoms do not improve or worsen or seek emergency care            TRexene Edison NP 12/29/2021

## 2021-12-29 NOTE — Assessment & Plan Note (Signed)
Under control.  Continue on Stiolto daily  Plan  Patient Instructions  Continue on Stiolto 2 puffs daily, rinse after use.  Claritin '10mg'$  At bedtime  for 1 week and then As needed   Saline nasal rinses Twice daily   Saline nasal gel At bedtime  .  Flutter valve Three times a day   Albuterol neb or inhaler .As needed   Activity as tolerated.  Flu shot this week as planned.  RSV vaccine later this Fall.  Follow up with Dr. Melvyn Novas or Athziry Millican NP in 3-4 months and As needed   Please contact office for sooner follow up if symptoms do not improve or worsen or seek emergency care

## 2021-12-29 NOTE — Patient Instructions (Addendum)
Continue on Stiolto 2 puffs daily, rinse after use.  Claritin '10mg'$  At bedtime  for 1 week and then As needed   Saline nasal rinses Twice daily   Saline nasal gel At bedtime  .  Flutter valve Three times a day   Albuterol neb or inhaler .As needed   Activity as tolerated.  Flu shot this week as planned.  RSV vaccine later this Fall.  Follow up with Dr. Melvyn Novas or Hudsyn Champine NP in 3-4 months and As needed   Please contact office for sooner follow up if symptoms do not improve or worsen or seek emergency care

## 2021-12-29 NOTE — Assessment & Plan Note (Signed)
Under control.  Encouraged on mucociliary clearance.  Use flutter at least once or twice daily.  Plan  Patient Instructions  Continue on Stiolto 2 puffs daily, rinse after use.  Claritin '10mg'$  At bedtime  for 1 week and then As needed   Saline nasal rinses Twice daily   Saline nasal gel At bedtime  .  Flutter valve Three times a day   Albuterol neb or inhaler .As needed   Activity as tolerated.  Flu shot this week as planned.  RSV vaccine later this Fall.  Follow up with Dr. Melvyn Novas or Rockne Dearinger NP in 3-4 months and As needed   Please contact office for sooner follow up if symptoms do not improve or worsen or seek emergency care

## 2022-01-02 DIAGNOSIS — Z23 Encounter for immunization: Secondary | ICD-10-CM | POA: Diagnosis not present

## 2022-01-07 DIAGNOSIS — J449 Chronic obstructive pulmonary disease, unspecified: Secondary | ICD-10-CM | POA: Diagnosis not present

## 2022-01-07 DIAGNOSIS — N182 Chronic kidney disease, stage 2 (mild): Secondary | ICD-10-CM | POA: Diagnosis not present

## 2022-01-07 DIAGNOSIS — R739 Hyperglycemia, unspecified: Secondary | ICD-10-CM | POA: Diagnosis not present

## 2022-01-07 DIAGNOSIS — I10 Essential (primary) hypertension: Secondary | ICD-10-CM | POA: Diagnosis not present

## 2022-01-14 DIAGNOSIS — Z961 Presence of intraocular lens: Secondary | ICD-10-CM | POA: Diagnosis not present

## 2022-01-14 DIAGNOSIS — H04123 Dry eye syndrome of bilateral lacrimal glands: Secondary | ICD-10-CM | POA: Diagnosis not present

## 2022-01-14 DIAGNOSIS — H353130 Nonexudative age-related macular degeneration, bilateral, stage unspecified: Secondary | ICD-10-CM | POA: Diagnosis not present

## 2022-01-14 DIAGNOSIS — M316 Other giant cell arteritis: Secondary | ICD-10-CM | POA: Diagnosis not present

## 2022-01-22 DIAGNOSIS — N390 Urinary tract infection, site not specified: Secondary | ICD-10-CM | POA: Diagnosis not present

## 2022-02-02 DIAGNOSIS — H53413 Scotoma involving central area, bilateral: Secondary | ICD-10-CM | POA: Diagnosis not present

## 2022-03-23 DIAGNOSIS — L570 Actinic keratosis: Secondary | ICD-10-CM | POA: Diagnosis not present

## 2022-03-23 DIAGNOSIS — L821 Other seborrheic keratosis: Secondary | ICD-10-CM | POA: Diagnosis not present

## 2022-03-23 DIAGNOSIS — G548 Other nerve root and plexus disorders: Secondary | ICD-10-CM | POA: Diagnosis not present

## 2022-03-23 DIAGNOSIS — D485 Neoplasm of uncertain behavior of skin: Secondary | ICD-10-CM | POA: Diagnosis not present

## 2022-03-23 DIAGNOSIS — D0439 Carcinoma in situ of skin of other parts of face: Secondary | ICD-10-CM | POA: Diagnosis not present

## 2022-03-23 DIAGNOSIS — L820 Inflamed seborrheic keratosis: Secondary | ICD-10-CM | POA: Diagnosis not present

## 2022-04-01 DIAGNOSIS — Z961 Presence of intraocular lens: Secondary | ICD-10-CM | POA: Diagnosis not present

## 2022-04-01 DIAGNOSIS — H353231 Exudative age-related macular degeneration, bilateral, with active choroidal neovascularization: Secondary | ICD-10-CM | POA: Diagnosis not present

## 2022-04-01 DIAGNOSIS — M316 Other giant cell arteritis: Secondary | ICD-10-CM | POA: Diagnosis not present

## 2022-04-01 DIAGNOSIS — H04123 Dry eye syndrome of bilateral lacrimal glands: Secondary | ICD-10-CM | POA: Diagnosis not present

## 2022-04-11 ENCOUNTER — Other Ambulatory Visit: Payer: Self-pay | Admitting: Adult Health

## 2022-04-11 DIAGNOSIS — J209 Acute bronchitis, unspecified: Secondary | ICD-10-CM

## 2022-04-21 ENCOUNTER — Other Ambulatory Visit: Payer: Self-pay | Admitting: Adult Health

## 2022-04-25 ENCOUNTER — Emergency Department (HOSPITAL_COMMUNITY): Payer: Medicare Other

## 2022-04-25 ENCOUNTER — Observation Stay (HOSPITAL_COMMUNITY)
Admission: EM | Admit: 2022-04-25 | Discharge: 2022-05-14 | Disposition: E | Payer: Medicare Other | Attending: Internal Medicine | Admitting: Internal Medicine

## 2022-04-25 DIAGNOSIS — J449 Chronic obstructive pulmonary disease, unspecified: Secondary | ICD-10-CM | POA: Insufficient documentation

## 2022-04-25 DIAGNOSIS — Z1152 Encounter for screening for COVID-19: Secondary | ICD-10-CM | POA: Diagnosis not present

## 2022-04-25 DIAGNOSIS — Z8542 Personal history of malignant neoplasm of other parts of uterus: Secondary | ICD-10-CM | POA: Diagnosis not present

## 2022-04-25 DIAGNOSIS — I2510 Atherosclerotic heart disease of native coronary artery without angina pectoris: Secondary | ICD-10-CM | POA: Diagnosis not present

## 2022-04-25 DIAGNOSIS — R0789 Other chest pain: Secondary | ICD-10-CM | POA: Diagnosis not present

## 2022-04-25 DIAGNOSIS — Z87891 Personal history of nicotine dependence: Secondary | ICD-10-CM | POA: Insufficient documentation

## 2022-04-25 DIAGNOSIS — I7100 Dissection of unspecified site of aorta: Principal | ICD-10-CM | POA: Insufficient documentation

## 2022-04-25 DIAGNOSIS — Z79899 Other long term (current) drug therapy: Secondary | ICD-10-CM | POA: Insufficient documentation

## 2022-04-25 DIAGNOSIS — I7101 Dissection of ascending aorta: Secondary | ICD-10-CM

## 2022-04-25 DIAGNOSIS — I10 Essential (primary) hypertension: Secondary | ICD-10-CM | POA: Diagnosis not present

## 2022-04-25 DIAGNOSIS — R0602 Shortness of breath: Secondary | ICD-10-CM | POA: Diagnosis not present

## 2022-04-25 LAB — BASIC METABOLIC PANEL
Anion gap: 12 (ref 5–15)
BUN: <5 mg/dL — ABNORMAL LOW (ref 8–23)
CO2: 24 mmol/L (ref 22–32)
Calcium: 8.6 mg/dL — ABNORMAL LOW (ref 8.9–10.3)
Chloride: 93 mmol/L — ABNORMAL LOW (ref 98–111)
Creatinine, Ser: 0.77 mg/dL (ref 0.44–1.00)
GFR, Estimated: >60 mL/min (ref >60)
Glucose, Bld: 213 mg/dL — ABNORMAL HIGH (ref 70–99)
Potassium: 2.9 mmol/L — ABNORMAL LOW (ref 3.5–5.1)
Sodium: 129 mmol/L — ABNORMAL LOW (ref 135–145)

## 2022-04-25 LAB — CBC
HCT: 36.7 % (ref 36.0–46.0)
Hemoglobin: 11.8 g/dL — ABNORMAL LOW (ref 12.0–15.0)
MCH: 28.9 pg (ref 26.0–34.0)
MCHC: 32.2 g/dL (ref 30.0–36.0)
MCV: 89.7 fL (ref 80.0–100.0)
Platelets: 288 10*3/uL (ref 150–400)
RBC: 4.09 MIL/uL (ref 3.87–5.11)
RDW: 16.1 % — ABNORMAL HIGH (ref 11.5–15.5)
WBC: 11.8 10*3/uL — ABNORMAL HIGH (ref 4.0–10.5)
nRBC: 0 % (ref 0.0–0.2)

## 2022-04-25 LAB — RESP PANEL BY RT-PCR (RSV, FLU A&B, COVID)  RVPGX2
Influenza A by PCR: NEGATIVE
Influenza B by PCR: NEGATIVE
Resp Syncytial Virus by PCR: NEGATIVE
SARS Coronavirus 2 by RT PCR: NEGATIVE

## 2022-04-25 LAB — TROPONIN I (HIGH SENSITIVITY)
Troponin I (High Sensitivity): 124 ng/L (ref <18)
Troponin I (High Sensitivity): 156 ng/L (ref <18)

## 2022-04-25 LAB — BRAIN NATRIURETIC PEPTIDE: B Natriuretic Peptide: 100.6 pg/mL — ABNORMAL HIGH (ref 0.0–100.0)

## 2022-04-25 MED ORDER — OXYCODONE HCL 5 MG PO TABS
5.0000 mg | ORAL_TABLET | ORAL | Status: DC | PRN
  Administered 2022-04-26: 5 mg via ORAL
  Filled 2022-04-25: qty 1

## 2022-04-25 MED ORDER — ONDANSETRON HCL 4 MG/2ML IJ SOLN: 4.0000 mg | Freq: Four times a day (QID) | INTRAMUSCULAR | Status: DC | PRN

## 2022-04-25 MED ORDER — SODIUM CHLORIDE 0.9 % IV BOLUS
1000.0000 mL | Freq: Once | INTRAVENOUS | Status: AC
  Administered 2022-04-25: 1000 mL via INTRAVENOUS

## 2022-04-25 MED ORDER — MORPHINE SULFATE (PF) 2 MG/ML IV SOLN
1.0000 mg | INTRAVENOUS | Status: DC | PRN
  Administered 2022-04-26 (×2): 1 mg via INTRAVENOUS
  Filled 2022-04-25 (×3): qty 1

## 2022-04-25 MED ORDER — IOHEXOL 350 MG/ML SOLN
75.0000 mL | Freq: Once | INTRAVENOUS | Status: AC | PRN
  Administered 2022-04-25: 75 mL via INTRAVENOUS

## 2022-04-25 MED ORDER — ONDANSETRON 4 MG PO TBDP: 4.0000 mg | ORAL_TABLET | Freq: Four times a day (QID) | ORAL | Status: DC | PRN

## 2022-04-25 MED ORDER — ACETAMINOPHEN 650 MG RE SUPP: 650.0000 mg | Freq: Four times a day (QID) | RECTAL | Status: DC | PRN

## 2022-04-25 MED ORDER — GLYCOPYRROLATE 0.2 MG/ML IJ SOLN: 0.2000 mg | INTRAMUSCULAR | Status: DC | PRN

## 2022-04-25 MED ORDER — GLYCOPYRROLATE 1 MG PO TABS: 1.0000 mg | ORAL_TABLET | ORAL | Status: DC | PRN

## 2022-04-25 MED ORDER — LORAZEPAM 2 MG/ML IJ SOLN
1.0000 mg | INTRAMUSCULAR | Status: DC | PRN
  Administered 2022-04-26: 1 mg via INTRAVENOUS
  Filled 2022-04-25: qty 1

## 2022-04-25 MED ORDER — ACETAMINOPHEN 325 MG PO TABS
650.0000 mg | ORAL_TABLET | Freq: Four times a day (QID) | ORAL | Status: DC | PRN
  Administered 2022-04-26: 650 mg via ORAL
  Filled 2022-04-25: qty 2

## 2022-04-25 NOTE — ED Provider Triage Note (Addendum)
Emergency Medicine Provider Triage Evaluation Note  Heidi Hobbs , a 82 y.o. female  was evaluated in triage.  Pt complains of shortness of breath that is been getting worse for the last couple days, and was given DuoNeb via EMS and Solu-Medrol with some improvement.  While given the DuoNeb, patient started stating that she was having severe left-sided chest pain, and began screaming please call my daughter and tell her that I left her to EMS.  History of atherosclerosis. Denies N/V  Review of Systems  Positive: Chest pain, SOB Negative: N/V  Physical Exam  BP (!) 95/53   Pulse (!) 109   Resp 20   SpO2 98%  Gen:   Awake, diaphoretic Resp:  Reduced air movement on Right, rhonchi throughout on Left MSK:   Moves extremities without difficulty   Medical Decision Making  Medically screening exam initiated at 8:07 PM.  Appropriate orders placed.  Lina Sayre was informed that the remainder of the evaluation will be completed by another provider, this initial triage assessment does not replace that evaluation, and the importance of remaining in the ED until their evaluation is complete.    Osvaldo Shipper, [place] 05/04/2022 2007  Notified RN, patient needs to be taken to room immediately.    Osvaldo Shipper, [place] 04/24/2022 2007

## 2022-04-25 NOTE — ED Provider Notes (Signed)
Heidi Hobbs   Heidi Hobbs Arrival date & time: 05/05/2022  1958     History {Add pertinent medical, surgical, social history, OB history to HPI:1} Chief Complaint  Patient presents with  . Shortness of Breath  . Chest Pain    Heidi Hobbs is a 82 y.o. female.  Heidi Hobbs is a     Shortness of Breath Associated symptoms: chest pain   Chest Pain Associated symptoms: shortness of breath        Home Medications Prior to Admission medications   Medication Sig Start Date End Date Taking? Authorizing Provider  acetaminophen (TYLENOL) 500 MG tablet Take 1,000 mg by mouth every 6 (six) hours as needed for fever.    [provider]  ALPRAZolam Duanne Moron) 0.25 MG tablet Take 0.25 tablets by mouth at bedtime. And as needed up to 2 times a day 07/22/17   [provider]  amitriptyline (ELAVIL) 100 MG tablet Take 100 mg by mouth at bedtime.     [provider]  Ascorbic Acid (VITAMIN C) 1000 MG tablet Take 1,000 mg by mouth daily.    [provider]  atenolol (TENORMIN) 50 MG tablet Take 50 mg by mouth daily.    [provider]  atorvastatin (LIPITOR) 40 MG tablet Take 40 mg by mouth every other day. At night    [provider]  cholecalciferol (VITAMIN D3) 25 MCG (1000 UT) tablet Take 1,000 Units by mouth daily.    [provider]  ezetimibe (ZETIA) 10 MG tablet Take 10 mg by mouth at bedtime.     [provider]  hydrALAZINE (APRESOLINE) 25 MG tablet Take 25 mg by mouth 2 (two) times daily. 02/02/21   [provider]  hydrochlorothiazide (HYDRODIURIL) 25 MG tablet Take 12.5 mg by mouth daily.    [provider]  losartan (COZAAR) 100 MG tablet Take 100 mg by mouth daily.    [provider]  methenamine (HIPREX) 1 g tablet Take 1 g by mouth 2 (two) times daily. 12/03/21   [provider]  omeprazole (PRILOSEC OTC)  20 MG tablet Take 1 tablet (20 mg total) by mouth daily before breakfast. Patient taking differently: Take 20 mg by mouth daily. 01/29/20   Tanda Rockers, MD  polyethylene glycol (MIRALAX / GLYCOLAX) 17 g packet Take 17 g by mouth daily as needed for moderate constipation. 10/26/21   Aline August, MD  PRESCRIPTION MEDICATION Place 1 Dose into both eyes every 30 (thirty) days. Ranibizumab injection - not sure about dosage.    [provider]  Propylene Glycol (SYSTANE COMPLETE OP) Place 1 drop into both eyes daily as needed (dry eyes after injection).    [provider]  Respiratory Therapy Supplies (FLUTTER) DEVI 1 Device by Does not apply route 2 (two) times daily. 06/08/19   [name], NP  Tiotropium Bromide-Olodaterol (STIOLTO RESPIMAT) 2.5-2.5 MCG/ACT AERS INHALE 2 PUFFS BY MOUTH INTO THE LUNGS DAILY 04/23/22   Parrett, Fonnie Mu, NP  traMADol (ULTRAM) 50 MG tablet Take 50-100 mg by mouth at bedtime. 02/27/19   [provider]  VENTOLIN HFA 108 (90 Base) MCG/ACT inhaler INHALE 2 PUFFS INTO THE LUNGS EVERY 4 HOURS AS NEEDED FOR WHEEZING OR SHORTNESS OF BREATH. 04/13/22   Parrett, Fonnie Mu, NP  vitamin B-12 (CYANOCOBALAMIN) 1000 MCG tablet Take 1,000 mcg by mouth daily.    [provider]      Allergies  Eggs or egg-derived products    Review of Systems   Review of Systems  Respiratory:  Positive for shortness of breath.   Cardiovascular:  Positive for chest pain.    Physical Exam Updated Vital Signs BP (!) 83/53   Pulse (!) 105   Resp (!) 28   SpO2 98%  Physical Exam  ED Results / Procedures / Treatments   Labs (all labs ordered are listed, but only abnormal results are displayed) Labs Reviewed  CBC - Abnormal; Notable for the following components:      Result Value   WBC 11.8 (*)    Hemoglobin 11.8 (*)    RDW 16.1 (*)    All other components within normal limits  RESP PANEL BY RT-PCR (RSV, FLU A&B, COVID)  [id number]  BASIC METABOLIC PANEL   BRAIN NATRIURETIC PEPTIDE  TROPONIN I (HIGH SENSITIVITY)    EKG None  Radiology No results found.  Procedures Procedures  {Document cardiac monitor, telemetry assessment procedure when appropriate:1}  Medications Ordered in ED Medications  sodium chloride 0.9 % bolus 1,000 mL (1,000 mLs Intravenous New Bag/Given 05/07/2022 2058)    ED Course/ Medical Decision Making/ A&P   {   Click here for ABCD2, HEART and other calculatorsREFRESH Hobbs before signing :1}                          Medical Decision Making Amount and/or Complexity of Data Reviewed Labs: ordered. Radiology: ordered.  Risk Prescription drug management.   ***  {Document critical care time when appropriate:1} {Document review of labs and clinical decision tools ie heart score, Chads2Vasc2 etc:1}  {Document your independent review of radiology images, and any outside records:1} {Document your discussion with family members, caretakers, and with consultants:1} {Document social determinants of health affecting pt's care:1} {Document your decision making why or why not admission, treatments were needed:1} Final Clinical Impression(s) / ED Diagnoses Final diagnoses:  None    Rx / DC Orders ED Discharge Orders     None

## 2022-04-25 NOTE — ED Notes (Signed)
Patient transported to CT 

## 2022-04-25 NOTE — Consult Note (Addendum)
Reason for Consult:type 2 aortic dissection Referring Physician: ED  Heidi Hobbs is an 82 y.o. female.  HPI: 82 yo woman with history of ASCVD, hypertension, hyperlipidemia, COPD, vasculitis, MAC, bronchiectasis and recurrent UTI.  Presented to ED with shortness of breath and  CP.  W/u revealed type 2 dissection with pericardial effusion.  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  No CP at present.  Past Medical History:  Diagnosis Date   Adenomatous colon polyp 1995, 2011   Anemia    Anxiety    Bronchiectasis ([place])    Cancer ([place])    uterine cancer   Carotid stenosis    40-59% right, less than 40% left   Cataracts, bilateral    COPD (chronic obstructive pulmonary disease) ([place])    Dyspnea    Essential hypertension 02/13/2016   Hyperlipidemia    Hypertension    MAC (mycobacterium avium-intracellulare complex)    Macular degeneration, dry 02/13/2016   Polio 1959   Stomach ulcer 1960   Urinary tract infection    Vasculitis ([place])    Normocomplementemic urticarial    Vasculitis ([place]) 02/13/2016   Followed by [facility]     Past Surgical History:  Procedure Laterality Date   ANTERIOR CERVICAL DECOMP/DISCECTOMY FUSION N/A 11/27/2018   Procedure: CERVICAL FIVE TO SIX , CERVICAL SIX TO SEVEN, ANTERIOR CERVICAL DECOMPRESSION/DISCECTOMY FUSION, ALLOGRAFT, PLATE;  Surgeon: [name], MD;  Location: [place] [facility];  Service: Orthopedics;  Laterality: N/A;   APPENDECTOMY     rupture appendix   ARTERY BIOPSY N/A 08/17/2019   Procedure: BIOPSY TEMPORAL ARTERY;  Surgeon: Leta Baptist, MD;  Location: [place];  Service: ENT;  Laterality: N/A;   BREAST ENHANCEMENT SURGERY     for inverted nipples   CATARACT EXTRACTION     COLONOSCOPY     COLONOSCOPY  10/14/2009   LAPAROSCOPIC APPENDECTOMY N/A 01/23/2016   Procedure: APPENDECTOMY LAPAROSCOPIC;  Surgeon: Erroll Luna, MD;  Location: [place];  Service: General;  Laterality: N/A;    POLYPECTOMY  2011   x8   TONSILLECTOMY AND ADENOIDECTOMY     VAGINAL HYSTERECTOMY  1970   uterine cancer    Family History  Problem Relation Age of Onset   Colon cancer Father    Esophageal cancer Brother    Esophageal cancer Brother    Colon cancer Brother    Asthma Daughter        as a child   Stomach cancer Neg Hx    Rectal cancer Neg Hx     Social History:  reports that she quit smoking about 4 years ago. Her smoking use included cigarettes. She has a 25.00 pack-year smoking history. She has never used smokeless tobacco. She reports that she does not drink alcohol and does not use drugs.  Allergies:  Allergies  Allergen Reactions   Eggs Or Egg-Derived Products     Skin nodules    Medications: Prior to Admission: (Not in a hospital admission)   Results for orders placed or performed during the hospital encounter of 05/06/2022 (from the past 48 hour(s))  Basic metabolic panel     Status: Abnormal   Collection Time: 04/22/2022  8:16 PM  Result Value Ref Range   Sodium 129 (L) 135 - 145 mmol/L   Potassium 2.9 (L) 3.5 - 5.1 mmol/L   Chloride 93 (L) 98 - 111 mmol/L   CO2 24 22 - 32 mmol/L   Glucose, Bld 213 (H) 70 - 99 mg/dL  Comment: Glucose reference range applies only to samples taken after fasting for at least 8 hours.   BUN <5 (L) 8 - 23 mg/dL   Creatinine, Ser 0.77 0.44 - 1.00 mg/dL   Calcium 8.6 (L) 8.9 - 10.3 mg/dL   GFR, Estimated >60 >60 mL/min    Comment: (NOTE) Calculated using the CKD-EPI Creatinine Equation (2021)    Anion gap 12 5 - 15    Comment: Performed at [facility] [address] [place], [place] [address]  CBC     Status: Abnormal   Collection Time: 04/20/2022  8:16 PM  Result Value Ref Range   WBC 11.8 (H) 4.0 - 10.5 K/uL   RBC 4.09 3.87 - 5.11 MIL/uL   Hemoglobin 11.8 (L) 12.0 - 15.0 g/dL   HCT 36.7 36.0 - 46.0 %   MCV 89.7 80.0 - 100.0 fL   MCH 28.9 26.0 - 34.0 pg   MCHC 32.2 30.0 - 36.0 g/dL   RDW 16.1 (H) 11.5 - 15.5 %    Platelets 288 150 - 400 K/uL   nRBC 0.0 0.0 - 0.2 %    Comment: Performed at [facility] [address] [place], [place] [address]  Troponin I (High Sensitivity)     Status: Abnormal   Collection Time: 04/24/2022  8:16 PM  Result Value Ref Range   Troponin I (High Sensitivity) 124 (HH) <18 ng/L    Comment: CRITICAL RESULT CALLED TO, READ BACK BY AND VERIFIED WITH L. Horace Porteous. 2127 05/12/2022. LPAIT (NOTE) Elevated high sensitivity troponin I (hsTnI) values and significant  changes across serial measurements may suggest ACS but many other  chronic and acute conditions are known to elevate hsTnI results.  Refer to the "Links" section for chest pain algorithms and additional  guidance. Performed at [facility], [place] [address] [place], [place] [address]   Brain natriuretic peptide     Status: Abnormal   Collection Time: 04/15/2022  8:16 PM  Result Value Ref Range   B Natriuretic Peptide 100.6 (H) 0.0 - 100.0 pg/mL    Comment: Performed at [facility] [address], [place],  [address]  Resp panel by RT-PCR (RSV, Flu A&B, Covid) Anterior Nasal Swab     Status: None   Collection Time: 04/17/2022  8:21 PM   Specimen: Anterior Nasal Swab  Result Value Ref Range   SARS Coronavirus 2 by RT PCR NEGATIVE NEGATIVE   Influenza A by PCR NEGATIVE NEGATIVE   Influenza B by PCR NEGATIVE NEGATIVE    Comment: (NOTE) The Xpert Xpress SARS-CoV-2/FLU/RSV plus assay is intended as an aid in the diagnosis of influenza from Nasopharyngeal swab specimens and should not be used as a sole basis for treatment. Nasal washings and aspirates are unacceptable for Xpert Xpress SARS-CoV-2/FLU/RSV testing.  Fact Sheet for Patients: EntrepreneurPulse.com.au  Fact Sheet for Healthcare Providers: IncredibleEmployment.be  This test is not yet approved or cleared by the [place] FDA and has been authorized for detection and/or diagnosis of  SARS-CoV-2 by FDA under an Emergency Use Authorization (EUA). This EUA will remain in effect (meaning this test can be used) for the duration of the COVID-19 declaration under Section 564(b)(1) of the Act, 21 U.S.C. section 360bbb-3(b)(1), unless the authorization is terminated or revoked.     Resp Syncytial Virus by PCR NEGATIVE NEGATIVE    Comment: (NOTE) Fact Sheet for Patients: EntrepreneurPulse.com.au  Fact Sheet for Healthcare Providers: IncredibleEmployment.be  This test is not  yet approved or cleared by the [place] and has been authorized for detection and/or diagnosis of SARS-CoV-2 by FDA under an Emergency Use Authorization (EUA). This EUA will remain in effect (meaning this test can be used) for the duration of the COVID-19 declaration under Section 564(b)(1) of the Act, 21 U.S.C. section 360bbb-3(b)(1), unless the authorization is terminated or revoked.  Performed at [facility], [place] [address] [place], [place] [address]     CT Angio Chest/Abd/Pel for Dissection W and/or Wo Contrast  Result Date: 04/27/2022 CLINICAL DATA:  Short of breath for the last several days. EXAM: CT ANGIOGRAPHY CHEST, ABDOMEN AND PELVIS TECHNIQUE: Non-contrast CT of the chest was initially obtained. Multidetector CT imaging through the chest, abdomen and pelvis was performed using the standard protocol during bolus administration of intravenous contrast. Multiplanar reconstructed images and MIPs were obtained and reviewed to evaluate the vascular anatomy. RADIATION DOSE REDUCTION: This exam was performed according to the departmental dose-optimization program which includes automated exposure control, adjustment of the mA and/or kV according to patient size and/or use of iterative reconstruction technique. CONTRAST:  61m OMNIPAQUE IOHEXOL 350 MG/ML SOLN COMPARISON:  CTA chest and CT abdomen and pelvis, 07/24/2021. FINDINGS: CTA CHEST FINDINGS  Cardiovascular: Dissection of the ascending thoracic aorta extending to the arch. This is new compared to the chest CTA from 07/24/2021. Arch branch vessels arise distal to the end of the dissection, from the true lumen. No dissection of the arch breaths vessels. No significant stenosis. Ascending thoracic aorta is dilated, as noted on the previous chest CTA, maximum 4.2 cm. Significant atherosclerotic plaque is noted throughout the arch and descending portion. Moderate-sized pericardial effusion with hyperattenuating fluid consistent with hemorrhage. Heart normal in size. Mediastinum/Nodes: No neck base, mediastinal or hilar masses. No enlarged lymph nodes. Trachea and esophagus are unremarkable. Lungs/Pleura: No acute finding in the lungs. Minor dependent subsegmental atelectasis. Moderate centrilobular emphysema. Small stable nodule, right upper lobe, 4 mm. No pleural effusion. No pneumothorax. Musculoskeletal: Mild chronic compression fracture of T4. No acute fracture. No bone lesion. Review of the MIP images confirms the above findings. CTA ABDOMEN AND PELVIS FINDINGS VASCULAR Aorta: Significant aortic atherosclerosis. No aneurysm or dissection. No significant aortic stenosis. Celiac: Patent without evidence of aneurysm, dissection, vasculitis or significant stenosis. SMA: Patent without evidence of aneurysm, dissection, vasculitis or significant stenosis. Renals: Atherosclerotic disease at the origins of both renal arteries. Approximally 50% narrowing on the left and 60% narrowing on the right, appearance unchanged from the CT from 07/24/2021. IMA: Small but patent. Inflow: Diffuse atherosclerotic disease. Hemodynamically significant narrowing of the common iliac arteries, approximally 70% on each side. Additional atherosclerotic disease noted along the external iliac vessels. Veins: No obvious venous abnormality within the limitations of this arterial phase study. Review of the MIP images confirms the above  findings. NON-VASCULAR Hepatobiliary: No focal liver abnormality is seen. No gallstones, gallbladder wall thickening, or biliary dilatation. Pancreas: Unremarkable. No pancreatic ductal dilatation or surrounding inflammatory changes. Spleen: Normal in size without focal abnormality. Adrenals/Urinary Tract: Nodular enlargement of the left adrenal gland, likely due to adenomas up to 11 mm in size. Small nodule at the crux of the right adrenal gland. Findings are stable from the prior CT. Symmetric renal enhancement. No renal mass or stone. No hydronephrosis. Normal ureters. Bladder distended, otherwise unremarkable. Stomach/Bowel: Stomach unremarkable. Small bowel and colon are normal in caliber. No wall thickening. No inflammation. Multiple left colon diverticula. Lymphatic: No enlarged lymph nodes. Reproductive: Status post hysterectomy.  No adnexal masses. Other: No ascites. Musculoskeletal: No fracture or acute finding.  No bone lesion. Review of the MIP images confirms the above findings. IMPRESSION: CTA IMPRESSION 1. Type A dissection of the thoracic aorta, involving the ascending aorta from the aortic root to the proximal arch, terminating prior to the origin of the arch branch vessels. Ascending aorta is also dilated to 4.2 cm. Dissection is new since the prior chest CTA from 07/24/2021. Dilation is stable. 2. Moderate pericardial effusion due to hemorrhage. 3. Diffuse aortic atherosclerosis. Hemodynamically significant narrowing of the origins of the renal arteries and common iliac arteries. NON CTA IMPRESSION 1. No other acute abnormality within the chest, abdomen or pelvis. Critical Value/emergent results were called by telephone at the time of interpretation on 05/01/2022 at 9:47 pm to provider DAVID YAO , who verbally acknowledged these results. Electronically Signed   By: Lajean Manes M.D.   On: 05/05/2022 21:48   DG Chest Port 1 View  Result Date: 04/20/2022 CLINICAL DATA:  Chest pain EXAM: PORTABLE  CHEST 1 VIEW COMPARISON:  10/25/2021 FINDINGS: Lungs are clear. No pneumothorax or pleural effusion. Cardiac size within normal limits. Pulmonary vascularity is normal. Atherosclerotic calcification is noted throughout the thoracic aorta. No acute bone abnormality. Cervical fusion plate noted. IMPRESSION: 1. No active disease. Electronically Signed   By: Fidela Salisbury M.D.   On: 04/16/2022 21:44    Review of Systems  Constitutional:  Positive for fatigue.  Respiratory:  Positive for cough, shortness of breath and wheezing.   Cardiovascular:  Positive for chest pain.   Blood pressure (!) 108/54, pulse 96, temperature (!) 97.4 F (36.3 C), temperature source Oral, resp. rate 17, SpO2 100 %. Physical Exam Constitutional:      Comments: frail  HENT:     Head: Normocephalic and atraumatic.  Neck:     Vascular: Carotid bruit (bilateral) present.  Cardiovascular:     Rate and Rhythm: Normal rate and regular rhythm.     Pulses:          Carotid pulses are 2+ on the right side and 2+ on the left side.      Radial pulses are 1+ on the right side and 1+ on the left side.       Dorsalis pedis pulses are 0 on the right side and 0 on the left side.       Posterior tibial pulses are 0 on the right side and 0 on the left side.     Heart sounds: Heart sounds are distant.     Comments: Difficult to auscultate HS, no definite murmur Pulmonary:     Effort: No respiratory distress.     Breath sounds: No wheezing or rales.     Comments: Decreased BS bilaterally Abdominal:     General: There is no distension.     Palpations: Abdomen is soft.  Musculoskeletal:     Right lower leg: No edema.     Left lower leg: No edema.  Skin:    Comments: Cool, dry  Neurological:     General: No focal deficit present.     Mental Status: She is alert and oriented to person, place, and time.     Cranial Nerves: No cranial nerve deficit.     Motor: No weakness.     Assessment/Plan: 82 yo woman with history of  ASCVD, hypertension, hyperlipidemia, COPD, vasculitis, MAC, bronchiectasis and recurrent UTI.  Presented to ED with shortness of breath and  CP.  Found to  have a type II dissection limited to ascending aorta.  Severe calcification of entire aorta, great vessels and visceral vessels.  No palpable distal pulses but not acutely ischemic.  Type II dissection- surgery indicated for survival benefit.  Explained to patient and her daughter high risk nature of the procedure in her case given age, severity of aortic disease and diffuse severe ASCVD.  I informed them of the general nature of the procedure including the need for GA, the incisions to be used, the use of cardiopulmonary bypass, and the possible need for circulatory arrest.  I informed them of the indications, risks, benefits and alternatives.  They understand the risks include but are not limited to death, stroke, MI, blood clots, bleeding, transfusions, infection, respiratory and renal failure, GI complications, as well as the risk of other unforeseeable complications.  She wishes to think it over and discuss with her daughter in private.  Melrose Nakayama 04/27/2022, 10:14 PM    Addendum  Heidi Hobbs has decided she does not want to have surgery even though it is her only reasonable hope for survival.  She wishes to go home.  I was frank that her decision would result in death >90% of the time.  If she were to change her mind, it would likely be too late to intervene.  She is of sound mind in my judgement and that of her daughter and is competent to make that decision.  I informed her that the decision to go home would be against medical advice  Revonda Standard. Roxan Hockey, MD [facility] [phone]

## 2022-04-25 NOTE — ED Triage Notes (Signed)
Pt arrived by EMS from home complaining of increased shortness of breath x2 days.  Pt has hx of COPD and had been using her inhalers and breathing treatments at home without relief.   During transport pt started to have sudden on set of chest pain and jaw pain that lasted 5 min.   EMS gave 1 duoneb, 324 Aspirin and 125 solumedrol   On arrival to ED pt with low BP, PA at bedside

## 2022-04-26 ENCOUNTER — Encounter (HOSPITAL_COMMUNITY): Payer: Self-pay | Admitting: Family Medicine

## 2022-04-26 ENCOUNTER — Other Ambulatory Visit: Payer: Self-pay

## 2022-04-26 DIAGNOSIS — Z66 Do not resuscitate: Secondary | ICD-10-CM | POA: Diagnosis not present

## 2022-04-26 DIAGNOSIS — I7100 Dissection of unspecified site of aorta: Secondary | ICD-10-CM | POA: Diagnosis not present

## 2022-04-26 DIAGNOSIS — I7101 Dissection of ascending aorta: Secondary | ICD-10-CM | POA: Diagnosis not present

## 2022-04-26 DIAGNOSIS — Z515 Encounter for palliative care: Secondary | ICD-10-CM | POA: Diagnosis not present

## 2022-04-26 DIAGNOSIS — I3139 Other pericardial effusion (noninflammatory): Secondary | ICD-10-CM

## 2022-04-27 ENCOUNTER — Ambulatory Visit: Payer: Medicare Other | Admitting: Adult Health

## 2022-05-14 NOTE — Death Summary Note (Signed)
DEATH SUMMARY   Patient Details  Name: Heidi Hobbs MRN: [id number] DOB: [birth year] [id number], Heidi Reichmann, MD Admission/Discharge Information   Admit Date:  05/25/2022  Date of Death: Date of Death: 05/26/2022  Time of Death: Time of Death: 0619  Length of Stay: 0   Principle Cause of death: Aortic dissection   Hospital Diagnoses: Principal Problem:   Aortic dissection [name])   Hospital Course: [name] is a 82 y.o. female with medical history significant of CAD, hypertension, COPD, bronchiectasis, GERD, GI bleed who presented with worsening shortness of breath.   On route to the ED she developed crushing chest pain and hypotensive in the ED down to the 80s over 50s but was fluid responsive.   CTA chest/abdomen/pelvis revealed type A dissection of the thoracic aorta involving the ascending aorta from the aortic root to the proximal arch.  There is moderate pericardial effusion due to hemorrhage.   She was evaluated by cardiothoracic surgeon Dr. Roxan Hockey who discussed with her and her family that surgery is indicated for survival benefit although the nature of the surgery is high risk due to her age, severity of aortic disease and diffuse severe ASCVD.   [name] decided that she did not want to proceed with surgery and confirmed this with me on my evaluation.  Reiterated that the lack of surgery would most likely result in imminent death.  Patient has good understanding of her poor prognosis and wants to proceed with comfort care. She and her family at bedside (daughter and son)wanted her to stay overnight in comfort care to receive pain medication as needed with goal of transitioning home with hospice, but pt ultimately died and was pronounced dead by two RN at 6:19am on 05/26/2022 with family members at bedside.   Assessment and Plan: * Aortic dissection ([place]) -pt presents with dyspnea and crushing chest pain.  -CTA chest/abdomen/pelvis revealed type A dissection of the  thoracic aorta involving the ascending aorta from the aortic root to the proximal arch.  There is moderate pericardial effusion due to hemorrhage. -She was evaluated by cardiothoracic surgeon Dr. Roxan Hockey who discussed with her and her family that surgery is indicated for survival benefit although the nature of the surgery is high risk due to her age, severity of aortic disease and diffuse severe ASCVD. Patient has repeatedly confirmed that she does NOT want to proceed with surgery. She is competent to make her own decisions at this time.  -Her and family wants to proceed with comfort care overnight with PRN pain medication and agrees to withholding any more vital checks, lab draws, or examinations until she can transition home with hospice  -end-of-life order set in place with PRN comfort medication and measures. Will keep on continuous oxygen supplementation for comfort.  -need hospice consult in the morning -she is DNR/DNI           Procedures: none  Consultations: cardiothoracic surgery  The results of significant diagnostics from this hospitalization (including imaging, microbiology, ancillary and laboratory) are listed below for reference.   Significant Diagnostic Studies: CT Angio Chest/Abd/Pel for Dissection W and/or Wo Contrast  Result Date: 05-25-22 CLINICAL DATA:  Short of breath for the last several days. EXAM: CT ANGIOGRAPHY CHEST, ABDOMEN AND PELVIS TECHNIQUE: Non-contrast CT of the chest was initially obtained. Multidetector CT imaging through the chest, abdomen and pelvis was performed using the standard protocol during bolus administration of intravenous contrast. Multiplanar reconstructed images and MIPs were obtained and reviewed to evaluate the vascular  anatomy. RADIATION DOSE REDUCTION: This exam was performed according to the departmental dose-optimization program which includes automated exposure control, adjustment of the mA and/or kV according to patient size  and/or use of iterative reconstruction technique. CONTRAST:  43m OMNIPAQUE IOHEXOL 350 MG/ML SOLN COMPARISON:  CTA chest and CT abdomen and pelvis, 07/24/2021. FINDINGS: CTA CHEST FINDINGS Cardiovascular: Dissection of the ascending thoracic aorta extending to the arch. This is new compared to the chest CTA from 07/24/2021. Arch branch vessels arise distal to the end of the dissection, from the true lumen. No dissection of the arch breaths vessels. No significant stenosis. Ascending thoracic aorta is dilated, as noted on the previous chest CTA, maximum 4.2 cm. Significant atherosclerotic plaque is noted throughout the arch and descending portion. Moderate-sized pericardial effusion with hyperattenuating fluid consistent with hemorrhage. Heart normal in size. Mediastinum/Nodes: No neck base, mediastinal or hilar masses. No enlarged lymph nodes. Trachea and esophagus are unremarkable. Lungs/Pleura: No acute finding in the lungs. Minor dependent subsegmental atelectasis. Moderate centrilobular emphysema. Small stable nodule, right upper lobe, 4 mm. No pleural effusion. No pneumothorax. Musculoskeletal: Mild chronic compression fracture of T4. No acute fracture. No bone lesion. Review of the MIP images confirms the above findings. CTA ABDOMEN AND PELVIS FINDINGS VASCULAR Aorta: Significant aortic atherosclerosis. No aneurysm or dissection. No significant aortic stenosis. Celiac: Patent without evidence of aneurysm, dissection, vasculitis or significant stenosis. SMA: Patent without evidence of aneurysm, dissection, vasculitis or significant stenosis. Renals: Atherosclerotic disease at the origins of both renal arteries. Approximally 50% narrowing on the left and 60% narrowing on the right, appearance unchanged from the CT from 07/24/2021. IMA: Small but patent. Inflow: Diffuse atherosclerotic disease. Hemodynamically significant narrowing of the common iliac arteries, approximally 70% on each side. Additional  atherosclerotic disease noted along the external iliac vessels. Veins: No obvious venous abnormality within the limitations of this arterial phase study. Review of the MIP images confirms the above findings. NON-VASCULAR Hepatobiliary: No focal liver abnormality is seen. No gallstones, gallbladder wall thickening, or biliary dilatation. Pancreas: Unremarkable. No pancreatic ductal dilatation or surrounding inflammatory changes. Spleen: Normal in size without focal abnormality. Adrenals/Urinary Tract: Nodular enlargement of the left adrenal gland, likely due to adenomas up to 11 mm in size. Small nodule at the crux of the right adrenal gland. Findings are stable from the prior CT. Symmetric renal enhancement. No renal mass or stone. No hydronephrosis. Normal ureters. Bladder distended, otherwise unremarkable. Stomach/Bowel: Stomach unremarkable. Small bowel and colon are normal in caliber. No wall thickening. No inflammation. Multiple left colon diverticula. Lymphatic: No enlarged lymph nodes. Reproductive: Status post hysterectomy. No adnexal masses. Other: No ascites. Musculoskeletal: No fracture or acute finding.  No bone lesion. Review of the MIP images confirms the above findings. IMPRESSION: CTA IMPRESSION 1. Type A dissection of the thoracic aorta, involving the ascending aorta from the aortic root to the proximal arch, terminating prior to the origin of the arch branch vessels. Ascending aorta is also dilated to 4.2 cm. Dissection is new since the prior chest CTA from 07/24/2021. Dilation is stable. 2. Moderate pericardial effusion due to hemorrhage. 3. Diffuse aortic atherosclerosis. Hemodynamically significant narrowing of the origins of the renal arteries and common iliac arteries. NON CTA IMPRESSION 1. No other acute abnormality within the chest, abdomen or pelvis. Critical Value/emergent results were called by telephone at the time of interpretation on 05/01/2022 at 9:47 pm to provider DAVID YAO , who  verbally acknowledged these results. Electronically Signed   By: DDedra SkeensD.  On: 04/22/2022 21:48   DG Chest Port 1 View  Result Date: 05/04/2022 CLINICAL DATA:  Chest pain EXAM: PORTABLE CHEST 1 VIEW COMPARISON:  10/25/2021 FINDINGS: Lungs are clear. No pneumothorax or pleural effusion. Cardiac size within normal limits. Pulmonary vascularity is normal. Atherosclerotic calcification is noted throughout the thoracic aorta. No acute bone abnormality. Cervical fusion plate noted. IMPRESSION: 1. No active disease. Electronically Signed   By: Fidela Salisbury M.D.   On: 05/01/2022 21:44    Microbiology: No results found for this or any previous visit (from the past 240 hour(s)).  Time spent: less than 30 minutes  Signed: Orene Desanctis, DO 10-May-2022

## 2022-05-14 NOTE — H&P (Signed)
History and Physical    Patient: Heidi Hobbs [id number] DOB: [birth year] DOA: 05/12/2022 DOS: the patient was seen and examined on 2022-05-19 PCP: Shon Baton, MD  Patient coming from: Home  Chief Complaint:  Chief Complaint  Patient presents with   Shortness of Breath   Chest Pain   HPI: Heidi Hobbs is a 82 y.o. female with medical history significant of CAD, hypertension, COPD, bronchiectasis, GERD, GI bleed who presents with worsening shortness of breath today.  On route to the ED she also developed crushing chest pain.She was hypotensive in the ED down to the 80s over 50s but was fluid responsive.  CTA chest/abdomen/pelvis revealed type A dissection of the thoracic aorta involving the ascending aorta from the aortic root to the proximal arch.  There is moderate pericardial effusion due to hemorrhage.  She was evaluated by cardiothoracic surgeon Dr. Roxan Hockey who discussed with her and her family that surgery is indicated for survival benefit although the nature of the surgery is high risk due to her age, severity of aortic disease and diffuse severe ASCVD.  Heidi Hobbs has decided that she did not want to proceed with surgery and again confirmed this with me on my evaluation.  Reiterated that the lack of surgery would most likely result in imminent death.  Patient has good understanding of her poor prognosis and wants to proceed with comfort care. She and her family at bedside (daughter and son) would like her to stay overnight in comfort care to receive pain medication as needed until she can transition home with hospice in the morning.   She has confirmed her DNR/DNI status as any attempts at resuscitation would be futile in her current situation.   Had prayer with patient and family. All questions were addressed and answered.   Review of Systems: As mentioned in the history of present illness. All other systems reviewed and are negative. Past Medical History:   Diagnosis Date   Adenomatous colon polyp 1995, 2011   Anemia    Anxiety    Bronchiectasis ([place])    Cancer ([place])    uterine cancer   Carotid stenosis    40-59% right, less than 40% left   Cataracts, bilateral    COPD (chronic obstructive pulmonary disease) ([place])    Dyspnea    Essential hypertension 02/13/2016   Hyperlipidemia    Hypertension    MAC (mycobacterium avium-intracellulare complex)    Macular degeneration, dry 02/13/2016   Polio 1959   Stomach ulcer 1960   Urinary tract infection    Vasculitis ([place])    Normocomplementemic urticarial    Vasculitis ([place]) 02/13/2016   Followed by [facility]    Past Surgical History:  Procedure Laterality Date   ANTERIOR CERVICAL DECOMP/DISCECTOMY FUSION N/A 11/27/2018   Procedure: CERVICAL FIVE TO SIX , CERVICAL SIX TO SEVEN, ANTERIOR CERVICAL DECOMPRESSION/DISCECTOMY FUSION, ALLOGRAFT, PLATE;  Surgeon: [name], MD;  Location: [place];  Service: Orthopedics;  Laterality: N/A;   APPENDECTOMY     rupture appendix   ARTERY BIOPSY N/A 08/17/2019   Procedure: BIOPSY TEMPORAL ARTERY;  Surgeon: Leta Baptist, MD;  Location: [place];  Service: ENT;  Laterality: N/A;   BREAST ENHANCEMENT SURGERY     for inverted nipples   CATARACT EXTRACTION     COLONOSCOPY     COLONOSCOPY  10/14/2009   LAPAROSCOPIC APPENDECTOMY N/A 01/23/2016   Procedure: APPENDECTOMY LAPAROSCOPIC;  Surgeon: Erroll Luna, MD;  Location: [place];  Service: General;  Laterality:  N/A;   POLYPECTOMY  2011   x8   TONSILLECTOMY AND ADENOIDECTOMY     VAGINAL HYSTERECTOMY  1970   uterine cancer   Social History:  reports that she quit smoking about 4 years ago. Her smoking use included cigarettes. She has a 25.00 pack-year smoking history. She has never used smokeless tobacco. She reports that she does not drink alcohol and does not use drugs.  Allergies  Allergen Reactions   Eggs Or Egg-Derived Products     Skin nodules    Family History   Problem Relation Age of Onset   Colon cancer Father    Esophageal cancer Brother    Esophageal cancer Brother    Colon cancer Brother    Asthma Daughter        as a child   Stomach cancer Neg Hx    Rectal cancer Neg Hx     Prior to Admission medications   Medication Sig Start Date End Date Taking? Authorizing Provider  acetaminophen (TYLENOL) 500 MG tablet Take 1,000 mg by mouth every 6 (six) hours as needed for fever.    [provider]  ALPRAZolam Duanne Moron) 0.25 MG tablet Take 0.25 tablets by mouth at bedtime. And as needed up to 2 times a day 07/22/17   [provider]  amitriptyline (ELAVIL) 100 MG tablet Take 100 mg by mouth at bedtime.     [provider]  Ascorbic Acid (VITAMIN C) 1000 MG tablet Take 1,000 mg by mouth daily.    [provider]  atenolol (TENORMIN) 50 MG tablet Take 50 mg by mouth daily.    [provider]  atorvastatin (LIPITOR) 40 MG tablet Take 40 mg by mouth every other day. At night    [provider]  cholecalciferol (VITAMIN D3) 25 MCG (1000 UT) tablet Take 1,000 Units by mouth daily.    [provider]  ezetimibe (ZETIA) 10 MG tablet Take 10 mg by mouth at bedtime.     [provider]  hydrALAZINE (APRESOLINE) 25 MG tablet Take 25 mg by mouth 2 (two) times daily. 02/02/21   [provider]  hydrochlorothiazide (HYDRODIURIL) 25 MG tablet Take 12.5 mg by mouth daily.    [provider]  losartan (COZAAR) 100 MG tablet Take 100 mg by mouth daily.    [provider]  methenamine (HIPREX) 1 g tablet Take 1 g by mouth 2 (two) times daily. 12/03/21   [provider]  omeprazole (PRILOSEC OTC) 20 MG tablet Take 1 tablet (20 mg total) by mouth daily before breakfast. Patient taking differently: Take 20 mg by mouth daily. 01/29/20   Tanda Rockers, MD  polyethylene glycol (MIRALAX / GLYCOLAX) 17 g packet Take 17 g by mouth daily as needed for moderate  constipation. 10/26/21   Aline August, MD  PRESCRIPTION MEDICATION Place 1 Dose into both eyes every 30 (thirty) days. Ranibizumab injection - not sure about dosage.    [provider]  Propylene Glycol (SYSTANE COMPLETE OP) Place 1 drop into both eyes daily as needed (dry eyes after injection).    [provider]  Respiratory Therapy Supplies (FLUTTER) DEVI 1 Device by Does not apply route 2 (two) times daily. 06/08/19   [name], NP  Tiotropium Bromide-Olodaterol (STIOLTO RESPIMAT) 2.5-2.5 MCG/ACT AERS INHALE 2 PUFFS BY MOUTH INTO THE LUNGS DAILY 04/23/22   Parrett, Fonnie Mu, NP  traMADol (ULTRAM) 50 MG tablet Take 50-100 mg by mouth at bedtime. 02/27/19   [provider]  VENTOLIN HFA 108 (90 Base) MCG/ACT inhaler INHALE 2 PUFFS INTO THE LUNGS EVERY 4 HOURS AS NEEDED FOR WHEEZING OR SHORTNESS OF BREATH. 04/13/22   Parrett, Fonnie Mu, NP  vitamin B-12 (CYANOCOBALAMIN) 1000 MCG tablet Take 1,000 mcg by mouth daily.    [provider]    Physical Exam: Vitals:   05/02/2022 2230 04/29/2022 2245 05/13/2022 0000 2022-05-13 0109  BP: 106/62 102/83 (!) 112/57 127/65  Pulse: 97 96 91 87  Resp: 18 16 (!) 25 16  Temp:    97.8 F (36.6 C)  TempSrc:      SpO2: 100% 98% 96% 96%   Limited exam as pt declined full examination   Constitutional: NAD, calm, comfortable laying in bed Eyes: lids and conjunctivae normal ENMT: Mucous membranes are moist. Respiratory: Normal respiratory effort. No accessory muscle use.  Musculoskeletal:  No joint deformity upper and lower extremities. Neurologic: alert and oriented x 4 Psychiatric: Normal judgment and insight.  Normal and pleasant mood.  Data Reviewed:  See HPI  Assessment and Plan: * Aortic dissection ([place]) -pt presents with dyspnea and crushing chest pain.  -CTA chest/abdomen/pelvis revealed type A dissection of the thoracic aorta involving the ascending aorta from the aortic root to the proximal arch.  There is moderate  pericardial effusion due to hemorrhage. -She was evaluated by cardiothoracic surgeon Dr. Roxan Hockey who discussed with her and her family that surgery is indicated for survival benefit although the nature of the surgery is high risk due to her age, severity of aortic disease and diffuse severe ASCVD. Patient has repeatedly confirmed that she does NOT want to proceed with surgery. She is competent to make her own decisions at this time.  -Her and family wants to proceed with comfort care overnight with PRN pain medication and agrees to withholding any more vital checks, lab draws, or examinations until she can transition home with hospice  -end-of-life order set in place with PRN comfort medication and measures. Will keep on continuous oxygen supplementation for comfort.  -need hospice consult in the morning -she is DNR/DNI        Advance Care Planning:   Code Status: DNR   Consults: CT surgery  Family Communication: daughter and son at bedside  Severity of Illness: The appropriate patient status for this patient is OBSERVATION. Observation status is judged to be reasonable and necessary in order to provide the required intensity of service to ensure the patient's safety. The patient's presenting symptoms, physical exam findings, and initial radiographic and laboratory data in the context of their medical condition is felt to place them at decreased risk for further clinical deterioration. Furthermore, it is anticipated that the patient will be medically stable for discharge from the hospital within 2 midnights of admission.   Author: Orene Desanctis, DO 05-13-2022 1:29 AM  For on call review [email or web address].

## 2022-05-14 NOTE — Discharge Summary (Deleted)
Physician Discharge Summary   Patient: Heidi Hobbs MRN: [id number] DOB: 16-May-1940  Admit date:     05/04/2022  Discharge date: 04/30/2022  Discharge Physician: Orene Desanctis   PCP: Shon Baton, MD   Recommendations at discharge:    Deceased   Discharge Diagnoses: Principal Problem:   Aortic dissection [facility])   Hospital Course: Heidi Hobbs is a 82 y.o. female with medical history significant of CAD, hypertension, COPD, bronchiectasis, GERD, GI bleed who presented with worsening shortness of breath.   On route to the ED she developed crushing chest pain and hypotensive in the ED down to the 80s over 50s but was fluid responsive.   CTA chest/abdomen/pelvis revealed type A dissection of the thoracic aorta involving the ascending aorta from the aortic root to the proximal arch.  There is moderate pericardial effusion due to hemorrhage.   She was evaluated by cardiothoracic surgeon Dr. Roxan Hockey who discussed with her and her family that surgery is indicated for survival benefit although the nature of the surgery is high risk due to her age, severity of aortic disease and diffuse severe ASCVD.   Heidi Hobbs decided that she did not want to proceed with surgery and confirmed this with me on my evaluation.  Reiterated that the lack of surgery would most likely result in imminent death.  Patient has good understanding of her poor prognosis and wants to proceed with comfort care. She and her family at bedside (daughter and son)wanted her to stay overnight in comfort care to receive pain medication as needed with goal of transitioning home with hospice, but Heidi Hobbs ultimately died and was pronounced dead by two RN at 6:19am on 30-Apr-2022 with family members at bedside.   Assessment and Plan: * Aortic dissection ([place]) -Heidi Hobbs presents with dyspnea and crushing chest pain.  -CTA chest/abdomen/pelvis revealed type A dissection of the thoracic aorta involving the ascending aorta from the aortic root  to the proximal arch.  There is moderate pericardial effusion due to hemorrhage. -She was evaluated by cardiothoracic surgeon Dr. Roxan Hockey who discussed with her and her family that surgery is indicated for survival benefit although the nature of the surgery is high risk due to her age, severity of aortic disease and diffuse severe ASCVD. Patient has repeatedly confirmed that she does NOT want to proceed with surgery. She is competent to make her own decisions at this time.  -Her and family wants to proceed with comfort care overnight with PRN pain medication and agrees to withholding any more vital checks, lab draws, or examinations until she can transition home with hospice  -end-of-life order set in place with PRN comfort medication and measures. Will keep on continuous oxygen supplementation for comfort.  -need hospice consult in the morning -she is DNR/DNI           Consultants: Cardiothoracic surgery Procedures performed: none  Disposition:  Deceased  Diet recommendation: None  DISCHARGE MEDICATION: Allergies as of 04-30-2022       Reactions   Eggs Or Egg-derived Products    Skin nodules        Medication List     ASK your doctor about these medications    acetaminophen 500 MG tablet Commonly known as: TYLENOL Take 1,000 mg by mouth every 6 (six) hours as needed for fever.   ALPRAZolam 0.25 MG tablet Commonly known as: XANAX Take 0.25 tablets by mouth at bedtime. And as needed up to 2 times a day   amitriptyline 100 MG tablet Commonly  known as: ELAVIL Take 100 mg by mouth at bedtime.   atenolol 50 MG tablet Commonly known as: TENORMIN Take 50 mg by mouth daily.   atorvastatin 40 MG tablet Commonly known as: LIPITOR Take 40 mg by mouth every other day. At night   cholecalciferol 25 MCG (1000 UNIT) tablet Commonly known as: VITAMIN D3 Take 1,000 Units by mouth daily.   cyanocobalamin 1000 MCG tablet Commonly known as: VITAMIN B12 Take 1,000 mcg by  mouth daily.   ezetimibe 10 MG tablet Commonly known as: ZETIA Take 10 mg by mouth at bedtime.   Flutter Devi 1 Device by Does not apply route 2 (two) times daily.   hydrALAZINE 25 MG tablet Commonly known as: APRESOLINE Take 25 mg by mouth 2 (two) times daily.   hydrochlorothiazide 25 MG tablet Commonly known as: HYDRODIURIL Take 12.5 mg by mouth daily.   losartan 100 MG tablet Commonly known as: COZAAR Take 100 mg by mouth daily.   methenamine 1 g tablet Commonly known as: HIPREX Take 1 g by mouth 2 (two) times daily.   omeprazole 20 MG tablet Commonly known as: PRILOSEC OTC Take 1 tablet (20 mg total) by mouth daily before breakfast.   polyethylene glycol 17 g packet Commonly known as: MIRALAX / GLYCOLAX Take 17 g by mouth daily as needed for moderate constipation.   PRESCRIPTION MEDICATION Place 1 Dose into both eyes every 30 (thirty) days. Ranibizumab injection - not sure about dosage.   Stiolto Respimat 2.5-2.5 MCG/ACT Aers Generic drug: Tiotropium Bromide-Olodaterol INHALE 2 PUFFS BY MOUTH INTO THE LUNGS DAILY   SYSTANE COMPLETE OP Place 1 drop into both eyes daily as needed (dry eyes after injection).   traMADol 50 MG tablet Commonly known as: ULTRAM Take 50-100 mg by mouth at bedtime.   Ventolin HFA 108 (90 Base) MCG/ACT inhaler Generic drug: albuterol INHALE 2 PUFFS INTO THE LUNGS EVERY 4 HOURS AS NEEDED FOR WHEEZING OR SHORTNESS OF BREATH.   vitamin C 1000 MG tablet Take 1,000 mg by mouth daily.        Discharge Exam: Heidi Hobbs is deceased pronounced by two RN.    Condition at discharge:  Deceased  The results of significant diagnostics from this hospitalization (including imaging, microbiology, ancillary and laboratory) are listed below for reference.   Imaging Studies: CT Angio Chest/Abd/Pel for Dissection W and/or Wo Contrast  Result Date: 05/11/2022 CLINICAL DATA:  Short of breath for the last several days. EXAM: CT ANGIOGRAPHY CHEST,  ABDOMEN AND PELVIS TECHNIQUE: Non-contrast CT of the chest was initially obtained. Multidetector CT imaging through the chest, abdomen and pelvis was performed using the standard protocol during bolus administration of intravenous contrast. Multiplanar reconstructed images and MIPs were obtained and reviewed to evaluate the vascular anatomy. RADIATION DOSE REDUCTION: This exam was performed according to the departmental dose-optimization program which includes automated exposure control, adjustment of the mA and/or kV according to patient size and/or use of iterative reconstruction technique. CONTRAST:  27m OMNIPAQUE IOHEXOL 350 MG/ML SOLN COMPARISON:  CTA chest and CT abdomen and pelvis, 07/24/2021. FINDINGS: CTA CHEST FINDINGS Cardiovascular: Dissection of the ascending thoracic aorta extending to the arch. This is new compared to the chest CTA from 07/24/2021. Arch branch vessels arise distal to the end of the dissection, from the true lumen. No dissection of the arch breaths vessels. No significant stenosis. Ascending thoracic aorta is dilated, as noted on the previous chest CTA, maximum 4.2 cm. Significant atherosclerotic plaque is noted throughout the arch and descending  portion. Moderate-sized pericardial effusion with hyperattenuating fluid consistent with hemorrhage. Heart normal in size. Mediastinum/Nodes: No neck base, mediastinal or hilar masses. No enlarged lymph nodes. Trachea and esophagus are unremarkable. Lungs/Pleura: No acute finding in the lungs. Minor dependent subsegmental atelectasis. Moderate centrilobular emphysema. Small stable nodule, right upper lobe, 4 mm. No pleural effusion. No pneumothorax. Musculoskeletal: Mild chronic compression fracture of T4. No acute fracture. No bone lesion. Review of the MIP images confirms the above findings. CTA ABDOMEN AND PELVIS FINDINGS VASCULAR Aorta: Significant aortic atherosclerosis. No aneurysm or dissection. No significant aortic stenosis. Celiac:  Patent without evidence of aneurysm, dissection, vasculitis or significant stenosis. SMA: Patent without evidence of aneurysm, dissection, vasculitis or significant stenosis. Renals: Atherosclerotic disease at the origins of both renal arteries. Approximally 50% narrowing on the left and 60% narrowing on the right, appearance unchanged from the CT from 07/24/2021. IMA: Small but patent. Inflow: Diffuse atherosclerotic disease. Hemodynamically significant narrowing of the common iliac arteries, approximally 70% on each side. Additional atherosclerotic disease noted along the external iliac vessels. Veins: No obvious venous abnormality within the limitations of this arterial phase study. Review of the MIP images confirms the above findings. NON-VASCULAR Hepatobiliary: No focal liver abnormality is seen. No gallstones, gallbladder wall thickening, or biliary dilatation. Pancreas: Unremarkable. No pancreatic ductal dilatation or surrounding inflammatory changes. Spleen: Normal in size without focal abnormality. Adrenals/Urinary Tract: Nodular enlargement of the left adrenal gland, likely due to adenomas up to 11 mm in size. Small nodule at the crux of the right adrenal gland. Findings are stable from the prior CT. Symmetric renal enhancement. No renal mass or stone. No hydronephrosis. Normal ureters. Bladder distended, otherwise unremarkable. Stomach/Bowel: Stomach unremarkable. Small bowel and colon are normal in caliber. No wall thickening. No inflammation. Multiple left colon diverticula. Lymphatic: No enlarged lymph nodes. Reproductive: Status post hysterectomy. No adnexal masses. Other: No ascites. Musculoskeletal: No fracture or acute finding.  No bone lesion. Review of the MIP images confirms the above findings. IMPRESSION: CTA IMPRESSION 1. Type A dissection of the thoracic aorta, involving the ascending aorta from the aortic root to the proximal arch, terminating prior to the origin of the arch branch vessels.  Ascending aorta is also dilated to 4.2 cm. Dissection is new since the prior chest CTA from 07/24/2021. Dilation is stable. 2. Moderate pericardial effusion due to hemorrhage. 3. Diffuse aortic atherosclerosis. Hemodynamically significant narrowing of the origins of the renal arteries and common iliac arteries. NON CTA IMPRESSION 1. No other acute abnormality within the chest, abdomen or pelvis. Critical Value/emergent results were called by telephone at the time of interpretation on 04/18/2022 at 9:47 pm to provider DAVID YAO , who verbally acknowledged these results. Electronically Signed   By: Lajean Manes M.D.   On: 04/21/2022 21:48   DG Chest Port 1 View  Result Date: 05/03/2022 CLINICAL DATA:  Chest pain EXAM: PORTABLE CHEST 1 VIEW COMPARISON:  10/25/2021 FINDINGS: Lungs are clear. No pneumothorax or pleural effusion. Cardiac size within normal limits. Pulmonary vascularity is normal. Atherosclerotic calcification is noted throughout the thoracic aorta. No acute bone abnormality. Cervical fusion plate noted. IMPRESSION: 1. No active disease. Electronically Signed   By: Fidela Salisbury M.D.   On: 05/12/2022 21:44    Microbiology: Results for orders placed or performed during the hospital encounter of 04/27/2022  Resp panel by RT-PCR (RSV, Flu A&B, Covid) Anterior Nasal Swab     Status: None   Collection Time: 04/20/2022  8:21 PM   Specimen: Anterior Nasal  Swab  Result Value Ref Range Status   SARS Coronavirus 2 by RT PCR NEGATIVE NEGATIVE Final   Influenza A by PCR NEGATIVE NEGATIVE Final   Influenza B by PCR NEGATIVE NEGATIVE Final    Comment: (NOTE) The Xpert Xpress SARS-CoV-2/FLU/RSV plus assay is intended as an aid in the diagnosis of influenza from Nasopharyngeal swab specimens and should not be used as a sole basis for treatment. Nasal washings and aspirates are unacceptable for Xpert Xpress SARS-CoV-2/FLU/RSV testing.  Fact Sheet for  Patients: EntrepreneurPulse.com.au  Fact Sheet for Healthcare Providers: IncredibleEmployment.be  This test is not yet approved or cleared by the [place] FDA and has been authorized for detection and/or diagnosis of SARS-CoV-2 by FDA under an Emergency Use Authorization (EUA). This EUA will remain in effect (meaning this test can be used) for the duration of the COVID-19 declaration under Section 564(b)(1) of the Act, 21 U.S.C. section 360bbb-3(b)(1), unless the authorization is terminated or revoked.     Resp Syncytial Virus by PCR NEGATIVE NEGATIVE Final    Comment: (NOTE) Fact Sheet for Patients: EntrepreneurPulse.com.au  Fact Sheet for Healthcare Providers: IncredibleEmployment.be  This test is not yet approved or cleared by the [place] FDA and has been authorized for detection and/or diagnosis of SARS-CoV-2 by FDA under an Emergency Use Authorization (EUA). This EUA will remain in effect (meaning this test can be used) for the duration of the COVID-19 declaration under Section 564(b)(1) of the Act, 21 U.S.C. section 360bbb-3(b)(1), unless the authorization is terminated or revoked.  Performed at [facility], [place], [place], [place] [address]     Labs: CBC: No results for input(s): "WBC", "NEUTROABS", "HGB", "HCT", "MCV", "PLT" in the last 168 hours. Basic Metabolic Panel: No results for input(s): "NA", "K", "CL", "CO2", "GLUCOSE", "BUN", "CREATININE", "CALCIUM", "MG", "PHOS" in the last 168 hours. Liver Function Tests: No results for input(s): "AST", "ALT", "ALKPHOS", "BILITOT", "PROT", "ALBUMIN" in the last 168 hours. CBG: No results for input(s): "GLUCAP" in the last 168 hours.  Discharge time spent: less than 30 minutes.  Signed: Orene Desanctis, DO [facility] 05/06/2022

## 2022-05-14 NOTE — ED Notes (Signed)
ED TO INPATIENT HANDOFF REPORT  ED Nurse Name and Phone #: Quentin [phone]  S Name/Age/Gender Heidi Hobbs 82 y.o. female Room/Bed: 024C/024C  Code Status   Code Status: DNR  Home/SNF/Other Home Patient oriented to: self, place, time, and situation Is this baseline? Yes   Triage Complete: Triage complete  Chief Complaint Aortic dissection ([place]) [I71.00]  Triage Note Pt arrived by EMS from home complaining of increased shortness of breath x2 days.  Pt has hx of COPD and had been using her inhalers and breathing treatments at home without relief.   During transport pt started to have sudden on set of chest pain and jaw pain that lasted 5 min.   EMS gave 1 duoneb, 324 Aspirin and 125 solumedrol   On arrival to ED pt with low BP, PA at bedside    Allergies Allergies  Allergen Reactions   Eggs Or Egg-Derived Products     Skin nodules    Level of Care/Admitting Diagnosis ED Disposition     ED Disposition  Admit   Condition  --   Comment  Hospital Area: [facility] [[id number]]  Level of Care: Med-Surg [16]  May place patient in observation at [facility] or [facility] if equivalent level of care is available:: No  Covid Evaluation: Asymptomatic - no recent exposure (last 10 days) testing not required  Diagnosis: Aortic dissection [facility]) [id number]  Admitting Physician: Orene Desanctis [id number]  Attending Physician: Orene Desanctis [id number]          B Medical/Surgery History Past Medical History:  Diagnosis Date   Adenomatous colon polyp 1995, 2011   Anemia    Anxiety    Bronchiectasis ([place])    Cancer ([place])    uterine cancer   Carotid stenosis    40-59% right, less than 40% left   Cataracts, bilateral    COPD (chronic obstructive pulmonary disease) ([place])    Dyspnea    Essential hypertension 02/13/2016   Hyperlipidemia    Hypertension    MAC (mycobacterium avium-intracellulare complex)    Macular degeneration, dry 02/13/2016   Polio 1959    Stomach ulcer 1960   Urinary tract infection    Vasculitis ([place])    Normocomplementemic urticarial    Vasculitis ([place]) 02/13/2016   Followed by [facility]    Past Surgical History:  Procedure Laterality Date   ANTERIOR CERVICAL DECOMP/DISCECTOMY FUSION N/A 11/27/2018   Procedure: CERVICAL FIVE TO SIX , CERVICAL SIX TO SEVEN, ANTERIOR CERVICAL DECOMPRESSION/DISCECTOMY FUSION, ALLOGRAFT, PLATE;  Surgeon: [name], MD;  Location: [facility];  Service: Orthopedics;  Laterality: N/A;   APPENDECTOMY     rupture appendix   ARTERY BIOPSY N/A 08/17/2019   Procedure: BIOPSY TEMPORAL ARTERY;  Surgeon: Leta Baptist, MD;  Location: [facility];  Service: ENT;  Laterality: N/A;   BREAST ENHANCEMENT SURGERY     for inverted nipples   CATARACT EXTRACTION     COLONOSCOPY     COLONOSCOPY  10/14/2009   LAPAROSCOPIC APPENDECTOMY N/A 01/23/2016   Procedure: APPENDECTOMY LAPAROSCOPIC;  Surgeon: Erroll Luna, MD;  Location: [facility];  Service: General;  Laterality: N/A;   POLYPECTOMY  2011   x8   TONSILLECTOMY AND ADENOIDECTOMY     VAGINAL HYSTERECTOMY  1970   uterine cancer     A IV Location/Drains/Wounds Patient Lines/Drains/Airways Status     Active Line/Drains/Airways     Name Placement date Placement time Site Days   Peripheral IV 04/23/2022 20 G  Anterior;Left;Proximal Forearm 05/10/2022  2000  Forearm  1   Peripheral IV 04/17/2022 18 G Anterior;Right Forearm 04/17/2022  2059  Forearm  1            Intake/Output Last 24 hours  Intake/Output Summary (Last 24 hours) at 05-22-22 0015 Last data filed at 04/24/2022 2207 Gross per 24 hour  Intake 566.1 ml  Output --  Net 566.1 ml    Labs/Imaging Results for orders placed or performed during the hospital encounter of 04/19/2022 (from the past 48 hour(s))  Basic metabolic panel     Status: Abnormal   Collection Time: 05/08/2022  8:16 PM  Result Value Ref Range   Sodium 129 (L) 135 - 145 mmol/L   Potassium 2.9 (L) 3.5 -  5.1 mmol/L   Chloride 93 (L) 98 - 111 mmol/L   CO2 24 22 - 32 mmol/L   Glucose, Bld 213 (H) 70 - 99 mg/dL    Comment: Glucose reference range applies only to samples taken after fasting for at least 8 hours.   BUN <5 (L) 8 - 23 mg/dL   Creatinine, Ser 0.77 0.44 - 1.00 mg/dL   Calcium 8.6 (L) 8.9 - 10.3 mg/dL   GFR, Estimated >60 >60 mL/min    Comment: (NOTE) Calculated using the CKD-EPI Creatinine Equation (2021)    Anion gap 12 5 - 15    Comment: Performed at [facility] [address], [place], [place] [address]  CBC     Status: Abnormal   Collection Time: 05/07/2022  8:16 PM  Result Value Ref Range   WBC 11.8 (H) 4.0 - 10.5 K/uL   RBC 4.09 3.87 - 5.11 MIL/uL   Hemoglobin 11.8 (L) 12.0 - 15.0 g/dL   HCT 36.7 36.0 - 46.0 %   MCV 89.7 80.0 - 100.0 fL   MCH 28.9 26.0 - 34.0 pg   MCHC 32.2 30.0 - 36.0 g/dL   RDW 16.1 (H) 11.5 - 15.5 %   Platelets 288 150 - 400 K/uL   nRBC 0.0 0.0 - 0.2 %    Comment: Performed at [facility] [address], [place], [place] [address]  Troponin I (High Sensitivity)     Status: Abnormal   Collection Time: 04/18/2022  8:16 PM  Result Value Ref Range   Troponin I (High Sensitivity) 124 (HH) <18 ng/L    Comment: CRITICAL RESULT CALLED TO, READ BACK BY AND VERIFIED WITH L. Horace Porteous. 2127 05/01/2022. LPAIT (NOTE) Elevated high sensitivity troponin I (hsTnI) values and significant  changes across serial measurements may suggest ACS but many other  chronic and acute conditions are known to elevate hsTnI results.  Refer to the "Links" section for chest pain algorithms and additional  guidance. Performed at [facility], [place] [address], [place], [place] [address]   Brain natriuretic peptide     Status: Abnormal   Collection Time: 04/21/2022  8:16 PM  Result Value Ref Range   B Natriuretic Peptide 100.6 (H) 0.0 - 100.0 pg/mL    Comment: Performed at [facility] [address], [place], [place] [address]  Resp panel by  RT-PCR (RSV, Flu A&B, Covid) Anterior Nasal Swab     Status: None   Collection Time: 04/24/2022  8:21 PM   Specimen: Anterior Nasal Swab  Result Value Ref Range   SARS Coronavirus 2 by RT PCR NEGATIVE NEGATIVE   Influenza A by PCR NEGATIVE NEGATIVE   Influenza B by PCR NEGATIVE  NEGATIVE    Comment: (NOTE) The Xpert Xpress SARS-CoV-2/FLU/RSV plus assay is intended as an aid in the diagnosis of influenza from Nasopharyngeal swab specimens and should not be used as a sole basis for treatment. Nasal washings and aspirates are unacceptable for Xpert Xpress SARS-CoV-2/FLU/RSV testing.  Fact Sheet for Patients: EntrepreneurPulse.com.au  Fact Sheet for Healthcare Providers: IncredibleEmployment.be  This test is not yet approved or cleared by the [place] FDA and has been authorized for detection and/or diagnosis of SARS-CoV-2 by FDA under an Emergency Use Authorization (EUA). This EUA will remain in effect (meaning this test can be used) for the duration of the COVID-19 declaration under Section 564(b)(1) of the Act, 21 U.S.C. section 360bbb-3(b)(1), unless the authorization is terminated or revoked.     Resp Syncytial Virus by PCR NEGATIVE NEGATIVE    Comment: (NOTE) Fact Sheet for Patients: EntrepreneurPulse.com.au  Fact Sheet for Healthcare Providers: IncredibleEmployment.be  This test is not yet approved or cleared by the [place] FDA and has been authorized for detection and/or diagnosis of SARS-CoV-2 by FDA under an Emergency Use Authorization (EUA). This EUA will remain in effect (meaning this test can be used) for the duration of the COVID-19 declaration under Section 564(b)(1) of the Act, 21 U.S.C. section 360bbb-3(b)(1), unless the authorization is terminated or revoked.  Performed at [facility], [place] [address], [place], [place] [address]   Troponin I (High Sensitivity)     Status:  Abnormal   Collection Time: 04/19/2022 10:16 PM  Result Value Ref Range   Troponin I (High Sensitivity) 156 (HH) <18 ng/L    Comment: CRITICAL VALUE NOTED. VALUE IS CONSISTENT WITH PREVIOUSLY REPORTED/CALLED VALUE (NOTE) Elevated high sensitivity troponin I (hsTnI) values and significant  changes across serial measurements may suggest ACS but many other  chronic and acute conditions are known to elevate hsTnI results.  Refer to the "Links" section for chest pain algorithms and additional  guidance. Performed at [facility], [place] [address], [place], [place] [address]    CT Angio Chest/Abd/Pel for Dissection W and/or Wo Contrast  Result Date: 05/11/2022 CLINICAL DATA:  Short of breath for the last several days. EXAM: CT ANGIOGRAPHY CHEST, ABDOMEN AND PELVIS TECHNIQUE: Non-contrast CT of the chest was initially obtained. Multidetector CT imaging through the chest, abdomen and pelvis was performed using the standard protocol during bolus administration of intravenous contrast. Multiplanar reconstructed images and MIPs were obtained and reviewed to evaluate the vascular anatomy. RADIATION DOSE REDUCTION: This exam was performed according to the departmental dose-optimization program which includes automated exposure control, adjustment of the mA and/or kV according to patient size and/or use of iterative reconstruction technique. CONTRAST:  49m OMNIPAQUE IOHEXOL 350 MG/ML SOLN COMPARISON:  CTA chest and CT abdomen and pelvis, 07/24/2021. FINDINGS: CTA CHEST FINDINGS Cardiovascular: Dissection of the ascending thoracic aorta extending to the arch. This is new compared to the chest CTA from 07/24/2021. Arch branch vessels arise distal to the end of the dissection, from the true lumen. No dissection of the arch breaths vessels. No significant stenosis. Ascending thoracic aorta is dilated, as noted on the previous chest CTA, maximum 4.2 cm. Significant atherosclerotic plaque is noted throughout the  arch and descending portion. Moderate-sized pericardial effusion with hyperattenuating fluid consistent with hemorrhage. Heart normal in size. Mediastinum/Nodes: No neck base, mediastinal or hilar masses. No enlarged lymph nodes. Trachea and esophagus are unremarkable. Lungs/Pleura: No acute finding in the lungs. Minor dependent subsegmental atelectasis. Moderate centrilobular emphysema. Small stable  nodule, right upper lobe, 4 mm. No pleural effusion. No pneumothorax. Musculoskeletal: Mild chronic compression fracture of T4. No acute fracture. No bone lesion. Review of the MIP images confirms the above findings. CTA ABDOMEN AND PELVIS FINDINGS VASCULAR Aorta: Significant aortic atherosclerosis. No aneurysm or dissection. No significant aortic stenosis. Celiac: Patent without evidence of aneurysm, dissection, vasculitis or significant stenosis. SMA: Patent without evidence of aneurysm, dissection, vasculitis or significant stenosis. Renals: Atherosclerotic disease at the origins of both renal arteries. Approximally 50% narrowing on the left and 60% narrowing on the right, appearance unchanged from the CT from 07/24/2021. IMA: Small but patent. Inflow: Diffuse atherosclerotic disease. Hemodynamically significant narrowing of the common iliac arteries, approximally 70% on each side. Additional atherosclerotic disease noted along the external iliac vessels. Veins: No obvious venous abnormality within the limitations of this arterial phase study. Review of the MIP images confirms the above findings. NON-VASCULAR Hepatobiliary: No focal liver abnormality is seen. No gallstones, gallbladder wall thickening, or biliary dilatation. Pancreas: Unremarkable. No pancreatic ductal dilatation or surrounding inflammatory changes. Spleen: Normal in size without focal abnormality. Adrenals/Urinary Tract: Nodular enlargement of the left adrenal gland, likely due to adenomas up to 11 mm in size. Small nodule at the crux of the right  adrenal gland. Findings are stable from the prior CT. Symmetric renal enhancement. No renal mass or stone. No hydronephrosis. Normal ureters. Bladder distended, otherwise unremarkable. Stomach/Bowel: Stomach unremarkable. Small bowel and colon are normal in caliber. No wall thickening. No inflammation. Multiple left colon diverticula. Lymphatic: No enlarged lymph nodes. Reproductive: Status post hysterectomy. No adnexal masses. Other: No ascites. Musculoskeletal: No fracture or acute finding.  No bone lesion. Review of the MIP images confirms the above findings. IMPRESSION: CTA IMPRESSION 1. Type A dissection of the thoracic aorta, involving the ascending aorta from the aortic root to the proximal arch, terminating prior to the origin of the arch branch vessels. Ascending aorta is also dilated to 4.2 cm. Dissection is new since the prior chest CTA from 07/24/2021. Dilation is stable. 2. Moderate pericardial effusion due to hemorrhage. 3. Diffuse aortic atherosclerosis. Hemodynamically significant narrowing of the origins of the renal arteries and common iliac arteries. NON CTA IMPRESSION 1. No other acute abnormality within the chest, abdomen or pelvis. Critical Value/emergent results were called by telephone at the time of interpretation on 05/06/2022 at 9:47 pm to provider DAVID YAO , who verbally acknowledged these results. Electronically Signed   By: Lajean Manes M.D.   On: 05/09/2022 21:48   DG Chest Port 1 View  Result Date: 05/06/2022 CLINICAL DATA:  Chest pain EXAM: PORTABLE CHEST 1 VIEW COMPARISON:  10/25/2021 FINDINGS: Lungs are clear. No pneumothorax or pleural effusion. Cardiac size within normal limits. Pulmonary vascularity is normal. Atherosclerotic calcification is noted throughout the thoracic aorta. No acute bone abnormality. Cervical fusion plate noted. IMPRESSION: 1. No active disease. Electronically Signed   By: Fidela Salisbury M.D.   On: 05/08/2022 21:44    Pending Labs Unresulted Labs  (From admission, onward)    None       Vitals/Pain Today's Vitals   04/15/2022 2215 05/11/2022 2230 04/15/2022 2245 2022-05-19 0000  BP: 110/61 106/62 102/83 (!) 112/57  Pulse: 97 97 96 91  Resp: 18 18 16 $ (!) 25  Temp:      TempSrc:      SpO2: 100% 100% 98% 96%    Isolation Precautions No active isolations  Medications Medications  acetaminophen (TYLENOL) tablet 650 mg (has no administration in time range)  Or  acetaminophen (TYLENOL) suppository 650 mg (has no administration in time range)  oxyCODONE (Oxy IR/ROXICODONE) immediate release tablet 5 mg (has no administration in time range)  morphine (PF) 2 MG/ML injection 1 mg (has no administration in time range)  ondansetron (ZOFRAN-ODT) disintegrating tablet 4 mg (has no administration in time range)    Or  ondansetron (ZOFRAN) injection 4 mg (has no administration in time range)  glycopyrrolate (ROBINUL) tablet 1 mg (has no administration in time range)    Or  glycopyrrolate (ROBINUL) injection 0.2 mg (has no administration in time range)    Or  glycopyrrolate (ROBINUL) injection 0.2 mg (has no administration in time range)  LORazepam (ATIVAN) injection 1 mg (has no administration in time range)  sodium chloride 0.9 % bolus 1,000 mL (0 mLs Intravenous Stopped 04/18/2022 2109)  iohexol (OMNIPAQUE) 350 MG/ML injection 75 mL (75 mLs Intravenous Contrast Given 04/20/2022 2118)  sodium chloride 0.9 % bolus 1,000 mL (0 mLs Intravenous Stopped 04/17/2022 2207)    Mobility walks     Focused Assessments Cardiac Assessment Handoff:  Cardiac Rhythm: Normal sinus rhythm No results found for: "CKTOTAL", "CKMB", "CKMBINDEX", "TROPONINI" Lab Results  Component Value Date   DDIMER 0.43 02/10/2021   Does the Patient currently have chest pain? No    R Recommendations: See Admitting Provider Note  Report given to:   Additional Notes: Comfort Care

## 2022-05-14 NOTE — Progress Notes (Addendum)
Two RN pronounce at 619am, family at bedside, awaiting other family members to arrive.  Heidi Hobbs   On-call provider notified.

## 2022-05-14 NOTE — Assessment & Plan Note (Addendum)
-  pt presents with dyspnea and crushing chest pain.  -CTA chest/abdomen/pelvis revealed type A dissection of the thoracic aorta involving the ascending aorta from the aortic root to the proximal arch.  There is moderate pericardial effusion due to hemorrhage. -She was evaluated by cardiothoracic surgeon Dr. Roxan Hockey who discussed with her and her family that surgery is indicated for survival benefit although the nature of the surgery is high risk due to her age, severity of aortic disease and diffuse severe ASCVD. Patient has repeatedly confirmed that she does NOT want to proceed with surgery. She is competent to make her own decisions at this time.  -Her and family wants to proceed with comfort care overnight with PRN pain medication and agrees to withholding any more vital checks, lab draws, or examinations until she can transition home with hospice  -end-of-life order set in place with PRN comfort medication and measures. Will keep on continuous oxygen supplementation for comfort.  -need hospice consult in the morning -she is DNR/DNI

## 2022-05-14 DEATH — deceased
